# Patient Record
Sex: Male | Born: 1955 | Race: White | Hispanic: No | Smoking: Current every day smoker
Health system: Southern US, Community
[De-identification: ages and names within clinical notes are randomized; demographics above are authoritative.]

## PROBLEM LIST (undated history)

## (undated) DIAGNOSIS — F419 Anxiety disorder, unspecified: Secondary | ICD-10-CM

## (undated) DIAGNOSIS — E119 Type 2 diabetes mellitus without complications: Secondary | ICD-10-CM

## (undated) DIAGNOSIS — F32A Depression, unspecified: Secondary | ICD-10-CM

## (undated) DIAGNOSIS — F1910 Other psychoactive substance abuse, uncomplicated: Secondary | ICD-10-CM

## (undated) DIAGNOSIS — F329 Major depressive disorder, single episode, unspecified: Secondary | ICD-10-CM

## (undated) DIAGNOSIS — E785 Hyperlipidemia, unspecified: Secondary | ICD-10-CM

## (undated) DIAGNOSIS — I10 Essential (primary) hypertension: Secondary | ICD-10-CM

## (undated) DIAGNOSIS — I8390 Asymptomatic varicose veins of unspecified lower extremity: Secondary | ICD-10-CM

## (undated) DIAGNOSIS — IMO0002 Reserved for concepts with insufficient information to code with codable children: Secondary | ICD-10-CM

## (undated) DIAGNOSIS — L409 Psoriasis, unspecified: Secondary | ICD-10-CM

## (undated) DIAGNOSIS — K219 Gastro-esophageal reflux disease without esophagitis: Secondary | ICD-10-CM

## (undated) DIAGNOSIS — F1994 Other psychoactive substance use, unspecified with psychoactive substance-induced mood disorder: Secondary | ICD-10-CM

## (undated) HISTORY — PX: VEIN LIGATION AND STRIPPING: SHX2653

## (undated) HISTORY — PX: CHOLECYSTECTOMY: SHX55

## (undated) HISTORY — DX: Gastro-esophageal reflux disease without esophagitis: K21.9

## (undated) HISTORY — DX: Hyperlipidemia, unspecified: E78.5

## (undated) HISTORY — DX: Psoriasis, unspecified: L40.9

## (undated) HISTORY — PX: SINUSOTOMY: SHX291

## (undated) HISTORY — DX: Reserved for concepts with insufficient information to code with codable children: IMO0002

## (undated) HISTORY — DX: Essential (primary) hypertension: I10

---

## 2002-08-30 ENCOUNTER — Emergency Department (HOSPITAL_COMMUNITY): Admission: EM | Admit: 2002-08-30 | Discharge: 2002-08-30 | Payer: Self-pay | Admitting: Emergency Medicine

## 2002-11-01 ENCOUNTER — Inpatient Hospital Stay (HOSPITAL_COMMUNITY): Admission: EM | Admit: 2002-11-01 | Discharge: 2002-11-05 | Payer: Self-pay | Admitting: Psychiatry

## 2007-08-22 ENCOUNTER — Emergency Department (HOSPITAL_COMMUNITY): Admission: EM | Admit: 2007-08-22 | Discharge: 2007-08-22 | Payer: Self-pay | Admitting: Emergency Medicine

## 2008-07-24 ENCOUNTER — Emergency Department (HOSPITAL_COMMUNITY): Admission: EM | Admit: 2008-07-24 | Discharge: 2008-07-24 | Payer: Self-pay | Admitting: Emergency Medicine

## 2008-08-24 ENCOUNTER — Emergency Department (HOSPITAL_BASED_OUTPATIENT_CLINIC_OR_DEPARTMENT_OTHER): Admission: EM | Admit: 2008-08-24 | Discharge: 2008-08-24 | Payer: Self-pay | Admitting: Emergency Medicine

## 2008-12-15 ENCOUNTER — Emergency Department (HOSPITAL_BASED_OUTPATIENT_CLINIC_OR_DEPARTMENT_OTHER): Admission: EM | Admit: 2008-12-15 | Discharge: 2008-12-15 | Payer: Self-pay | Admitting: Emergency Medicine
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
## 2011-08-20 ENCOUNTER — Emergency Department (HOSPITAL_COMMUNITY)
Admission: EM | Admit: 2011-08-20 | Discharge: 2011-08-20 | Disposition: A | Discharge status: Home / Self Care | Payer: Self-pay

## 2011-08-20 ENCOUNTER — Encounter: Payer: Self-pay | Admitting: *Deleted
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
## 2011-08-20 NOTE — ED Notes (Signed)
Patient has lower back pain that radiates into the right hip and leg.  Patient states the pain is throbbing and he cannot sleep.  Patient has hx of herniated disk

## 2012-07-20 ENCOUNTER — Emergency Department (HOSPITAL_COMMUNITY): Payer: Medicare Other

## 2012-07-20 ENCOUNTER — Encounter (HOSPITAL_COMMUNITY): Payer: Self-pay | Admitting: Emergency Medicine

## 2012-07-20 ENCOUNTER — Emergency Department (HOSPITAL_COMMUNITY)
Admission: EM | Admit: 2012-07-20 | Discharge: 2012-07-21 | Disposition: A | Discharge status: Home / Self Care | Payer: Medicare Other | Attending: Emergency Medicine | Admitting: Emergency Medicine

## 2012-07-20 DIAGNOSIS — Y92009 Unspecified place in unspecified non-institutional (private) residence as the place of occurrence of the external cause: Secondary | ICD-10-CM | POA: Insufficient documentation

## 2012-07-20 DIAGNOSIS — S93409A Sprain of unspecified ligament of unspecified ankle, initial encounter: Secondary | ICD-10-CM | POA: Insufficient documentation

## 2012-07-20 DIAGNOSIS — F3289 Other specified depressive episodes: Secondary | ICD-10-CM | POA: Insufficient documentation

## 2012-07-20 DIAGNOSIS — F329 Major depressive disorder, single episode, unspecified: Secondary | ICD-10-CM | POA: Insufficient documentation

## 2012-07-20 DIAGNOSIS — F17200 Nicotine dependence, unspecified, uncomplicated: Secondary | ICD-10-CM | POA: Insufficient documentation

## 2012-07-20 DIAGNOSIS — F411 Generalized anxiety disorder: Secondary | ICD-10-CM | POA: Insufficient documentation

## 2012-07-20 DIAGNOSIS — R296 Repeated falls: Secondary | ICD-10-CM | POA: Insufficient documentation

## 2012-07-20 DIAGNOSIS — Z79899 Other long term (current) drug therapy: Secondary | ICD-10-CM | POA: Insufficient documentation

## 2012-07-20 DIAGNOSIS — Y93E1 Activity, personal bathing and showering: Secondary | ICD-10-CM | POA: Insufficient documentation

## 2012-07-20 HISTORY — DX: Major depressive disorder, single episode, unspecified: F32.9

## 2012-07-20 HISTORY — DX: Anxiety disorder, unspecified: F41.9

## 2012-07-20 HISTORY — DX: Depression, unspecified: F32.A

## 2012-07-20 NOTE — ED Notes (Signed)
PT. REPORTS PAIN / SWELLING AT RIGHT ANKLE SLIPPED AND FELL ON WET FLOOR 3 DAYS AGO .

## 2012-07-21 MED ORDER — HYDROCODONE-ACETAMINOPHEN 5-325 MG PO TABS: 1.0000 | ORAL_TABLET | Freq: Once | ORAL | Status: DC

## 2012-07-21 MED ORDER — HYDROCODONE-ACETAMINOPHEN 5-325 MG PO TABS
1.0000 | ORAL_TABLET | Freq: Once | ORAL | Status: AC
  Administered 2012-07-21: 1 via ORAL
  Filled 2012-07-21: qty 1

## 2012-07-21 NOTE — ED Provider Notes (Signed)
Medical screening examination/treatment/procedure(s) were performed by non-physician practitioner and as supervising physician I was immediately available for consultation/collaboration.    Jayland Null, MD 07/21/12 0608 

## 2012-07-21 NOTE — ED Provider Notes (Signed)
History     CSN: [id number]  Arrival date & time 07/20/12  2247   First MD Initiated Contact with Patient 07/21/12 0025      Chief Complaint  Patient presents with  . Ankle Pain    (Consider location/radiation/quality/duration/timing/severity/associated sxs/prior treatment) HPI Comments: Patient fell in the shower over a week ago still having R ankle pain taking OTC advil without relief has not seen his PCP  Patient is a 56 y.o. male presenting with ankle pain. The history is provided by the patient.  Ankle Pain  The incident occurred more than 1 week ago. The incident occurred at home. The injury mechanism was a fall. Pertinent negatives include no numbness.    Past Medical History  Diagnosis Date  . Anxiety   . Depression     Past Surgical History  Procedure Date  . Vein ligation and stripping   . Cholecystectomy   . Sinusotomy     No family history on file.  History  Substance Use Topics  . Smoking status: Current Every Day Smoker  . Smokeless tobacco: Not on file  . Alcohol Use: No      Review of Systems  Constitutional: Negative for fever and chills.  Musculoskeletal: Negative for joint swelling.  Skin: Positive for wound.  Neurological: Negative for weakness and numbness.    Allergies  Review of patient's allergies indicates no known allergies.  Home Medications   Current Outpatient Rx  Name Route Sig Dispense Refill  . ACETAMINOPHEN 325 MG PO TABS Oral Take 650 mg by mouth every 6 (six) hours as needed. For pain    . HUMIRA PEN [place] Subcutaneous Inject 40 mg into the skin once a week. mondays     . CLONAZEPAM 2 MG PO TABS Oral Take 2 mg by mouth 3 (three) times daily as needed. For panic attacks     . SERTRALINE HCL 100 MG PO TABS Oral Take 100 mg by mouth 2 (two) times daily.      Marland Kitchen HYDROCODONE-ACETAMINOPHEN 5-325 MG PO TABS Oral Take 1 tablet by mouth once. 30 tablet 0    BP 130/97  Pulse 105  Temp 97.8 F (36.6 C) (Oral)  Resp 14  SpO2  97%  Physical Exam  Constitutional: He appears well-developed and well-nourished.  HENT:  Head: Normocephalic.  Eyes: Pupils are equal, round, and reactive to light.  Neck: Normal range of motion.  Cardiovascular: Normal rate.   Pulmonary/Chest: Effort normal.  Musculoskeletal: Normal range of motion. He exhibits tenderness. He exhibits no edema.       Feet:  Neurological: He is alert.  Skin: Skin is warm. No erythema.    ED Course  Procedures (including critical care time)  Labs Reviewed - No data to display Dg Ankle Complete Right  07/20/2012  *RADIOLOGY REPORT*  Clinical Data: Fall with ankle pain and swelling.  RIGHT ANKLE - COMPLETE 3+ VIEW  Comparison: None.  Findings: Three views of the ankle are negative for acute fracture or dislocation.  There may be spurring along the dorsal aspect of the talus. There is a small calcaneal spur.  IMPRESSION: No acute bony abnormality.   Original Report Authenticated By: Richarda Overlie, M.D.      1. Ankle sprain       MDM  No FX seen on film will give ASO and medicate  With referral         Arman Filter, NP 07/21/12 0040

## 2012-07-21 NOTE — Progress Notes (Signed)
Orthopedic Tech Progress Note Patient Details:  Cory Foster 10-24-1955 [id number]  Ortho Devices Type of Ortho Device: ASO Ortho Device/Splint Interventions: Application   Malachi Bonds Ray 07/21/2012, 7:05 AM

## 2012-07-21 NOTE — ED Notes (Signed)
Patient reports falling in the bathroom 1 week ago on a wet floor.  Patient denies striking his head when he fell.  Patient is able to ambulate independently but reports pain with ambulation.  Full ROM noted to right foot and right lower extremity.  Pulses present in right foot and right lower extremity.

## 2013-05-23 ENCOUNTER — Emergency Department (HOSPITAL_BASED_OUTPATIENT_CLINIC_OR_DEPARTMENT_OTHER): Payer: Medicare Other

## 2013-05-23 ENCOUNTER — Encounter (HOSPITAL_BASED_OUTPATIENT_CLINIC_OR_DEPARTMENT_OTHER): Payer: Self-pay | Admitting: *Deleted

## 2013-05-23 ENCOUNTER — Emergency Department (HOSPITAL_BASED_OUTPATIENT_CLINIC_OR_DEPARTMENT_OTHER)
Admission: EM | Admit: 2013-05-23 | Discharge: 2013-05-24 | Disposition: A | Discharge status: Home / Self Care | Payer: Medicare Other | Attending: Emergency Medicine | Admitting: Emergency Medicine

## 2013-05-23 DIAGNOSIS — S39012A Strain of muscle, fascia and tendon of lower back, initial encounter: Secondary | ICD-10-CM

## 2013-05-23 DIAGNOSIS — S7001XA Contusion of right hip, initial encounter: Secondary | ICD-10-CM

## 2013-05-23 DIAGNOSIS — F411 Generalized anxiety disorder: Secondary | ICD-10-CM | POA: Insufficient documentation

## 2013-05-23 DIAGNOSIS — W010XXA Fall on same level from slipping, tripping and stumbling without subsequent striking against object, initial encounter: Secondary | ICD-10-CM | POA: Insufficient documentation

## 2013-05-23 DIAGNOSIS — S46911A Strain of unspecified muscle, fascia and tendon at shoulder and upper arm level, right arm, initial encounter: Secondary | ICD-10-CM

## 2013-05-23 DIAGNOSIS — F329 Major depressive disorder, single episode, unspecified: Secondary | ICD-10-CM | POA: Insufficient documentation

## 2013-05-23 DIAGNOSIS — Z9889 Other specified postprocedural states: Secondary | ICD-10-CM | POA: Insufficient documentation

## 2013-05-23 DIAGNOSIS — S335XXA Sprain of ligaments of lumbar spine, initial encounter: Secondary | ICD-10-CM | POA: Insufficient documentation

## 2013-05-23 DIAGNOSIS — S7000XA Contusion of unspecified hip, initial encounter: Secondary | ICD-10-CM | POA: Insufficient documentation

## 2013-05-23 DIAGNOSIS — Y9229 Other specified public building as the place of occurrence of the external cause: Secondary | ICD-10-CM | POA: Insufficient documentation

## 2013-05-23 DIAGNOSIS — Y9389 Activity, other specified: Secondary | ICD-10-CM | POA: Insufficient documentation

## 2013-05-23 DIAGNOSIS — F17200 Nicotine dependence, unspecified, uncomplicated: Secondary | ICD-10-CM | POA: Insufficient documentation

## 2013-05-23 DIAGNOSIS — W19XXXA Unspecified fall, initial encounter: Secondary | ICD-10-CM

## 2013-05-23 DIAGNOSIS — F3289 Other specified depressive episodes: Secondary | ICD-10-CM | POA: Insufficient documentation

## 2013-05-23 DIAGNOSIS — IMO0002 Reserved for concepts with insufficient information to code with codable children: Secondary | ICD-10-CM | POA: Insufficient documentation

## 2013-05-23 DIAGNOSIS — Z79899 Other long term (current) drug therapy: Secondary | ICD-10-CM | POA: Insufficient documentation

## 2013-05-23 NOTE — ED Notes (Signed)
Pt c/o fall x 4 hrs ago with right hip and back pain
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
## 2013-05-23 NOTE — ED Notes (Signed)
MD at bedside. 

## 2013-05-24 MED ORDER — OXYCODONE-ACETAMINOPHEN 5-325 MG PO TABS
2.0000 | ORAL_TABLET | Freq: Once | ORAL | Status: AC
  Administered 2013-05-24: 2 via ORAL
  Filled 2013-05-24: qty 2

## 2013-05-24 MED ORDER — OXYCODONE-ACETAMINOPHEN 5-325 MG PO TABS: 2.0000 | ORAL_TABLET | ORAL | Status: DC | PRN

## 2013-05-24 MED ORDER — OXYCODONE-ACETAMINOPHEN 5-325 MG PO TABS
ORAL_TABLET | ORAL | Status: AC
  Administered 2013-05-24: 2 via ORAL
  Filled 2013-05-24: qty 2

## 2013-06-02 ENCOUNTER — Emergency Department (HOSPITAL_COMMUNITY)
Admission: EM | Admit: 2013-06-02 | Discharge: 2013-06-02 | Disposition: A | Discharge status: Home / Self Care | Payer: Medicare Other | Attending: Emergency Medicine | Admitting: Emergency Medicine

## 2013-06-02 ENCOUNTER — Encounter (HOSPITAL_COMMUNITY): Payer: Self-pay | Admitting: Emergency Medicine

## 2013-06-02 ENCOUNTER — Emergency Department (HOSPITAL_COMMUNITY): Payer: Medicare Other

## 2013-06-02 DIAGNOSIS — F411 Generalized anxiety disorder: Secondary | ICD-10-CM | POA: Insufficient documentation

## 2013-06-02 DIAGNOSIS — S63502A Unspecified sprain of left wrist, initial encounter: Secondary | ICD-10-CM

## 2013-06-02 DIAGNOSIS — Z79899 Other long term (current) drug therapy: Secondary | ICD-10-CM | POA: Insufficient documentation

## 2013-06-02 DIAGNOSIS — Y9339 Activity, other involving climbing, rappelling and jumping off: Secondary | ICD-10-CM | POA: Insufficient documentation

## 2013-06-02 DIAGNOSIS — S63509A Unspecified sprain of unspecified wrist, initial encounter: Secondary | ICD-10-CM | POA: Insufficient documentation

## 2013-06-02 DIAGNOSIS — F329 Major depressive disorder, single episode, unspecified: Secondary | ICD-10-CM | POA: Insufficient documentation

## 2013-06-02 DIAGNOSIS — Y929 Unspecified place or not applicable: Secondary | ICD-10-CM | POA: Insufficient documentation

## 2013-06-02 DIAGNOSIS — F3289 Other specified depressive episodes: Secondary | ICD-10-CM | POA: Insufficient documentation

## 2013-06-02 DIAGNOSIS — W1809XA Striking against other object with subsequent fall, initial encounter: Secondary | ICD-10-CM | POA: Insufficient documentation

## 2013-06-02 DIAGNOSIS — F17200 Nicotine dependence, unspecified, uncomplicated: Secondary | ICD-10-CM | POA: Insufficient documentation

## 2013-06-02 DIAGNOSIS — F1010 Alcohol abuse, uncomplicated: Secondary | ICD-10-CM | POA: Insufficient documentation

## 2013-06-02 MED ORDER — OXYCODONE-ACETAMINOPHEN 5-325 MG PO TABS
1.0000 | ORAL_TABLET | Freq: Once | ORAL | Status: AC
  Administered 2013-06-02: 1 via ORAL
  Filled 2013-06-02: qty 1

## 2013-06-02 MED ORDER — OXYCODONE-ACETAMINOPHEN 5-325 MG PO TABS: 1.0000 | ORAL_TABLET | ORAL | Status: DC | PRN
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
## 2013-06-02 NOTE — ED Provider Notes (Signed)
Medical screening examination/treatment/procedure(s) were performed by non-physician practitioner and as supervising physician I was immediately available for consultation/collaboration.  Lyanne Co, MD 06/02/13 [phone]
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
## 2013-06-02 NOTE — ED Provider Notes (Signed)
CSN: [id number]     Arrival date & time 06/02/13  2016 History   First MD Initiated Contact with Patient 06/02/13 2020     Chief Complaint  Patient presents with  . Arm Injury  . Alcohol Intoxication   (Consider location/radiation/quality/duration/timing/severity/associated sxs/prior Treatment) Patient is a 57 y.o. male presenting with arm injury and intoxication. The history is provided by the patient.  Arm Injury Location:  Wrist Injury: yes   Mechanism of injury: fall   Fall:    Impact surface:  Gravel Wrist location:  L wrist Associated symptoms: no fever and no neck pain   Associated symptoms comment:  Left wrist injury after her jumped from a porch and broke his fall with outstretched left arm. He denies having hit his head. No neck, chest or abdominal pain. He has been ambulatory without lower extremity pain.  Alcohol Intoxication Pertinent negatives include no abdominal pain, chills, fever, neck pain or numbness.    Past Medical History  Diagnosis Date  . Anxiety   . Depression    Past Surgical History  Procedure Laterality Date  . Vein ligation and stripping    . Cholecystectomy    . Sinusotomy     History reviewed. No pertinent family history. History  Substance Use Topics  . Smoking status: Current Every Day Smoker -- 0.50 packs/day    Types: Cigarettes  . Smokeless tobacco: Not on file  . Alcohol Use: Yes    Review of Systems  Constitutional: Negative for fever and chills.  HENT: Negative.  Negative for neck pain.   Gastrointestinal: Negative.  Negative for abdominal pain.  Musculoskeletal:       See HPI  Skin: Negative.  Negative for wound.  Neurological: Negative.  Negative for numbness.    Allergies  Review of patient's allergies indicates no known allergies.  Home Medications   Current Outpatient Rx  Name  Route  Sig  Dispense  Refill  . acetaminophen (TYLENOL) 325 MG tablet   Oral   Take 650 mg by mouth every 6 (six) hours as needed. For  pain         . clonazePAM (KLONOPIN) 2 MG tablet   Oral   Take 2 mg by mouth 3 (three) times daily as needed. For panic attacks          . HYDROcodone-acetaminophen (NORCO/VICODIN) 5-325 MG per tablet   Oral   Take 1 tablet by mouth once.   30 tablet   0   . oxyCODONE-acetaminophen (PERCOCET) 5-325 MG per tablet   Oral   Take 2 tablets by mouth every 4 (four) hours as needed for pain.   15 tablet   0   . sertraline (ZOLOFT) 100 MG tablet   Oral   Take 100 mg by mouth 2 (two) times daily.            There were no vitals taken for this visit. Physical Exam  Constitutional: He is oriented to person, place, and time. He appears well-developed and well-nourished. No distress.  Patient is acutely intoxicated but coherent, NAD and cooperative.  HENT:  Head: Atraumatic.  Neck: Normal range of motion.  Pulmonary/Chest: Effort normal. He exhibits no tenderness.  Abdominal: There is no tenderness.  Musculoskeletal:  No lower extremity or right arm tenderness or deformity. Left wrist minimally swollen without bony deformity. Pain on rotation. 5/5 grip.   Neurological: He is alert and oriented to person, place, and time.  Cranial nerves 3-12 grossly intact. Ambulatory with steady  gait.  Skin: Skin is warm and dry.  Psychiatric: He has a normal mood and affect.    ED Course  Procedures (including critical care time) Labs Review Labs Reviewed - No data to display Imaging Review No results found. Dg Lumbar Spine Complete  05/24/2013   *RADIOLOGY REPORT*  Clinical Data: Back pain, fall.  LUMBAR SPINE - COMPLETE 4+ VIEW  Comparison: 07/24/2008  Findings: Postoperative changes of posterior fusion at L4-5. Minimal retrolisthesis of L3 on L4. L5-S1 degenerative disc disease.  Mild T11 vertebral body height loss is similar to prior. No acute fracture or dislocation. The right upper quadrant surgical clips.  Overlying soft tissues unremarkable.  IMPRESSION: No acute osseous finding of  the lumbar spine.  Postoperative changes at L4-5.  Degenerative changes are most pronounced at L5-S1.  Minimal retrolisthesis of L3 on L4.   Original Report Authenticated By: Jearld Lesch, M.D.   Dg Shoulder Right  05/24/2013   *RADIOLOGY REPORT*  Clinical Data: Right shoulder pain status post fall  RIGHT SHOULDER - 2+ VIEW  Comparison: None.  Findings: Glenohumeral and acromioclavicular joints are intact.  No displaced fracture or dislocation.  Right upper lung grossly clear.  IMPRESSION: No acute osseous finding right shoulder.  If clinical concern for a fracture persists, recommend a repeat radiograph in 5-10 days to evaluate for interval change or callus formation.   Original Report Authenticated By: Jearld Lesch, M.D.   Dg Wrist Complete Left  06/02/2013   *RADIOLOGY REPORT*  Clinical Data: Fall, posterolateral pain at carpus and distal forearm  LEFT WRIST - COMPLETE 3+ VIEW  Comparison: Wrist radiographs 07/24/2008  Findings: Osseous demineralization. Joint spaces preserved. Deformity at base of fifth metacarpal consistent with an old healed fracture. No acute fracture, dislocation, or bone destruction.  IMPRESSION: Osseous mineralization. No acute abnormalities.   Original Report Authenticated By: Ulyses Southward, M.D.   Dg Hip Complete Right  05/24/2013   *RADIOLOGY REPORT*  Clinical Data: Fall, right hip pain  RIGHT HIP - COMPLETE 2+ VIEW  Comparison: None.  Findings: Postoperative changes L4-5.  SI joints grossly intact. No displaced hip fracture. Femoral head projects over the acetabulum.  Rami intact.  IMPRESSION: No acute osseous finding right hip.  MRI has increased sensitivity if concern for a nondisplaced hip fracture persists.   Original Report Authenticated By: Jearld Lesch, M.D.   MDM  No diagnosis found. 1. Left wrist sprain  No fracture on x-ray c/w wrist sprain. Splint applied. Stable for discharge.     Arnoldo Hooker, PA-C 06/02/13 2143

## 2013-07-05 ENCOUNTER — Emergency Department (HOSPITAL_COMMUNITY): Payer: Medicare Other

## 2013-07-05 ENCOUNTER — Encounter (HOSPITAL_COMMUNITY): Payer: Self-pay | Admitting: Emergency Medicine

## 2013-07-05 ENCOUNTER — Emergency Department (HOSPITAL_COMMUNITY)
Admission: EM | Admit: 2013-07-05 | Discharge: 2013-07-06 | Disposition: A | Discharge status: Home / Self Care | Payer: Medicare Other | Attending: Emergency Medicine | Admitting: Emergency Medicine

## 2013-07-05 DIAGNOSIS — G8929 Other chronic pain: Secondary | ICD-10-CM

## 2013-07-05 DIAGNOSIS — F411 Generalized anxiety disorder: Secondary | ICD-10-CM | POA: Insufficient documentation

## 2013-07-05 DIAGNOSIS — Z79899 Other long term (current) drug therapy: Secondary | ICD-10-CM | POA: Insufficient documentation

## 2013-07-05 DIAGNOSIS — F3289 Other specified depressive episodes: Secondary | ICD-10-CM | POA: Insufficient documentation

## 2013-07-05 DIAGNOSIS — Z8679 Personal history of other diseases of the circulatory system: Secondary | ICD-10-CM | POA: Insufficient documentation

## 2013-07-05 DIAGNOSIS — F17200 Nicotine dependence, unspecified, uncomplicated: Secondary | ICD-10-CM | POA: Insufficient documentation

## 2013-07-05 DIAGNOSIS — F419 Anxiety disorder, unspecified: Secondary | ICD-10-CM

## 2013-07-05 DIAGNOSIS — R5381 Other malaise: Secondary | ICD-10-CM | POA: Insufficient documentation

## 2013-07-05 DIAGNOSIS — R112 Nausea with vomiting, unspecified: Secondary | ICD-10-CM | POA: Insufficient documentation

## 2013-07-05 DIAGNOSIS — R630 Anorexia: Secondary | ICD-10-CM | POA: Insufficient documentation

## 2013-07-05 DIAGNOSIS — R1011 Right upper quadrant pain: Secondary | ICD-10-CM | POA: Insufficient documentation

## 2013-07-05 DIAGNOSIS — F1010 Alcohol abuse, uncomplicated: Secondary | ICD-10-CM

## 2013-07-05 DIAGNOSIS — F329 Major depressive disorder, single episode, unspecified: Secondary | ICD-10-CM | POA: Insufficient documentation

## 2013-07-05 HISTORY — DX: Asymptomatic varicose veins of unspecified lower extremity: I83.90

## 2013-07-05 LAB — URINALYSIS, ROUTINE W REFLEX MICROSCOPIC
Bilirubin Urine: NEGATIVE
Glucose, UA: NEGATIVE mg/dL
Hgb urine dipstick: NEGATIVE
Ketones, ur: NEGATIVE mg/dL
Nitrite: NEGATIVE
Specific Gravity, Urine: 1.006 (ref 1.005–1.030)
Urobilinogen, UA: 1 mg/dL (ref 0.0–1.0)
pH: 7 (ref 5.0–8.0)

## 2013-07-05 LAB — CBC
Hemoglobin: 16.3 g/dL (ref 13.0–17.0)
MCV: 108.1 fL — ABNORMAL HIGH (ref 78.0–100.0)
Platelets: 213 10*3/uL (ref 150–400)
RBC: 4.21 MIL/uL — ABNORMAL LOW (ref 4.22–5.81)
RDW: 14.4 % (ref 11.5–15.5)
WBC: 11.2 10*3/uL — ABNORMAL HIGH (ref 4.0–10.5)

## 2013-07-05 LAB — LIPASE, BLOOD: Lipase: 48 U/L (ref 11–59)

## 2013-07-05 LAB — COMPREHENSIVE METABOLIC PANEL
AST: 70 U/L — ABNORMAL HIGH (ref 0–37)
Albumin: 2.8 g/dL — ABNORMAL LOW (ref 3.5–5.2)
BUN: 3 mg/dL — ABNORMAL LOW (ref 6–23)
GFR calc Af Amer: >90 mL/min (ref >90)
Total Bilirubin: 0.8 mg/dL (ref 0.3–1.2)
Total Protein: 7.2 g/dL (ref 6.0–8.3)

## 2013-07-05 MED ORDER — PROMETHAZINE HCL 25 MG PO TABS: 25.0000 mg | ORAL_TABLET | Freq: Four times a day (QID) | ORAL | Status: DC | PRN

## 2013-07-05 MED ORDER — IOHEXOL 300 MG/ML  SOLN
100.0000 mL | Freq: Once | INTRAMUSCULAR | Status: AC | PRN
  Administered 2013-07-05: 100 mL via INTRAVENOUS

## 2013-07-05 MED ORDER — SODIUM CHLORIDE 0.9 % IV BOLUS (SEPSIS)
1000.0000 mL | Freq: Once | INTRAVENOUS | Status: AC
  Administered 2013-07-05: 1000 mL via INTRAVENOUS

## 2013-07-05 MED ORDER — POTASSIUM CHLORIDE 20 MEQ/15ML (10%) PO LIQD
40.0000 meq | Freq: Once | ORAL | Status: AC
  Administered 2013-07-06: 40 meq via ORAL
  Filled 2013-07-05: qty 30

## 2013-07-05 MED ORDER — IOHEXOL 300 MG/ML  SOLN
50.0000 mL | Freq: Once | INTRAMUSCULAR | Status: AC | PRN
  Administered 2013-07-05: 50 mL via ORAL

## 2013-07-05 NOTE — ED Notes (Signed)
Bed: WA08 Expected date:  Expected time:  Means of arrival:  Comments: EMS/57 yo male with right abdominal pain

## 2013-07-05 NOTE — ED Notes (Signed)
Pt states the past week he's had R flank pain, denies burning w/ urination, denies n/v/d, states the past 4 days he's had difficulty eating, states he eats then doesn't finish it d/t feeling full and discomfort, states his doctor wants him to be checked for pancreatitis and states his iron level has been too high and his doctor wants that checked also, pt states also he's been stressed out lately, takes klonopin for his anxiety and states at times he has SI/HI thoughts.

## 2013-07-05 NOTE — ED Notes (Signed)
Per EMS pt has chronic abdominal pain x 30 years, but for past week has had R sided Flank pain that has increased, pt is stressed out and depressed, does have hx of depression and anxiety, pt has had nausea, no vomiting. BP 130/90, HR 84, CBG 161, RR 18.

## 2013-07-05 NOTE — ED Notes (Signed)
Patient transported to CT 

## 2013-07-05 NOTE — ED Provider Notes (Signed)
CSN: [id number]     Arrival date & time 07/05/13  1947 History   First MD Initiated Contact with Patient 07/05/13 1948     Chief Complaint  Patient presents with  . Abdominal Pain   (Consider location/radiation/quality/duration/timing/severity/associated sxs/prior Treatment) The history is provided by the patient and medical records. No language interpreter was used.    Cory Foster is a 57 y.o. male  with a hx of anxiety, depression, varicose vein, EtOH abuse presents to the Emergency Department complaining of gradual, persistent, progressively worsening right flank pain onset 4-5 days ago. Patient reports this pain is the same pain that he's had for 30 years only slightly worse.  Pt reports he has vomited x1 in the AM x2 days in the last week.  Emesis is NBNB.  Associated symptoms include increased anxiety, decreased appetite, fatigue.   Nothing makes it better and nothing makes it worse.  Pt denies fever, chills, headache, neck pain, chest pain, SOB, diarrhea, weakness, dizziness, syncope, dysuria, hematuria.  On further questioning patient does admit that his pain gets worse after he drinks alcohol. He reports heavy drinking in the last week.  PCP: [facility]  Past Medical History  Diagnosis Date  . Anxiety   . Depression   . Varicose vein    Past Surgical History  Procedure Laterality Date  . Vein ligation and stripping    . Cholecystectomy    . Sinusotomy     No family history on file. History  Substance Use Topics  . Smoking status: Current Every Day Smoker -- 0.50 packs/day    Types: Cigarettes  . Smokeless tobacco: Not on file  . Alcohol Use: Yes    Review of Systems  Constitutional: Negative for fever, diaphoresis, appetite change, fatigue and unexpected weight change.  HENT: Negative for mouth sores and trouble swallowing.   Respiratory: Negative for cough, chest tightness, shortness of breath, wheezing and stridor.   Cardiovascular: Negative for chest pain and  palpitations.  Gastrointestinal: Positive for nausea and abdominal pain. Negative for vomiting, diarrhea, constipation, blood in stool, abdominal distention and rectal pain.  Genitourinary: Negative for dysuria, urgency, frequency, hematuria, flank pain and difficulty urinating.  Musculoskeletal: Negative for back pain, neck pain and neck stiffness.  Skin: Negative for rash.  Neurological: Negative for weakness.  Hematological: Negative for adenopathy.  Psychiatric/Behavioral: Negative for confusion.  All other systems reviewed and are negative.    Allergies  Review of patient's allergies indicates no known allergies.  Home Medications   Current Outpatient Rx  Name  Route  Sig  Dispense  Refill  . clonazePAM (KLONOPIN) 2 MG tablet   Oral   Take 2 mg by mouth 3 (three) times daily as needed. For panic attacks          . sertraline (ZOLOFT) 100 MG tablet   Oral   Take 100 mg by mouth 2 (two) times daily.           . promethazine (PHENERGAN) 25 MG tablet   Oral   Take 1 tablet (25 mg total) by mouth every 6 (six) hours as needed for nausea.   12 tablet   0    BP 125/83  Pulse 92  Temp(Src) 98.1 F (36.7 C) (Oral)  Resp 18  Ht 6\' 2"  (1.88 m)  Wt 235 lb (106.595 kg)  BMI 30.16 kg/m2  SpO2 96% Physical Exam  Nursing note and vitals reviewed. Constitutional: He is oriented to person, place, and time. He appears well-developed and  well-nourished. No distress.  Awake, alert, nontoxic appearance, unkempt appearance  HENT:  Head: Normocephalic and atraumatic.  Mouth/Throat: Oropharynx is clear and moist. No oropharyngeal exudate.  Eyes: Conjunctivae are normal. Pupils are equal, round, and reactive to light. No scleral icterus.  Neck: Normal range of motion. Neck supple.  Cardiovascular: Normal rate, regular rhythm, normal heart sounds and intact distal pulses.   No murmur heard. Pulmonary/Chest: Effort normal and breath sounds normal. No respiratory distress. He has no  wheezes.  Abdominal: Soft. Bowel sounds are normal. He exhibits no distension, no ascites and no mass. There is tenderness in the right upper quadrant. There is guarding. There is no rebound. Hernia confirmed negative in the right inguinal area and confirmed negative in the left inguinal area.  Genitourinary: Testes normal. Right testis shows no mass, no swelling and no tenderness. Right testis is descended. Cremasteric reflex is not absent on the right side. Left testis shows no mass, no swelling and no tenderness. Left testis is descended. Cremasteric reflex is not absent on the left side. No phimosis, paraphimosis, hypospadias, penile erythema or penile tenderness. No discharge found.  No visible or palpable hernia  Musculoskeletal: Normal range of motion. He exhibits no edema and no tenderness.  Lymphadenopathy:    He has no cervical adenopathy.       Right: No inguinal adenopathy present.       Left: No inguinal adenopathy present.  Neurological: He is alert and oriented to person, place, and time. Coordination normal.  Speech is clear and goal oriented Moves extremities without ataxia  Skin: Skin is warm and dry. He is not diaphoretic. No erythema.  Psychiatric: He has a normal mood and affect. His behavior is normal.    ED Course  Procedures (including critical care time) Labs Review Labs Reviewed  CBC - Abnormal; Notable for the following:    WBC 11.2 (*)    RBC 4.21 (*)    MCV 108.1 (*)    MCH 38.7 (*)    All other components within normal limits  COMPREHENSIVE METABOLIC PANEL - Abnormal; Notable for the following:    Sodium 134 (*)    Potassium 3.0 (*)    Chloride 91 (*)    Glucose, Bld 165 (*)    BUN 3 (*)    Albumin 2.8 (*)    AST 70 (*)    All other components within normal limits  LIPASE, BLOOD  URINALYSIS, ROUTINE W REFLEX MICROSCOPIC   Imaging Review Ct Abdomen Pelvis W Contrast  07/05/2013   *RADIOLOGY REPORT*  Clinical Data: Abdominal pain for 30 years.  CT  ABDOMEN AND PELVIS WITH CONTRAST  Technique:  Multidetector CT imaging of the abdomen and pelvis was performed following the standard protocol during bolus administration of intravenous contrast.  Contrast: OMNIPAQUE IOHEXOL 300 MG/ML  SOLN  Comparison: Lumbar spine radiographs May 23, 2013.  Findings: Limited view of the lung bases are clear.  The included heart and pericardium are unremarkable.  The liver is diffusely mildly hypodense, most consistent with fatty infiltration with slightly nodular contour, and enlarged at to 27 cm in cranial caudad dimension.  The, spleen, pancreas, and adrenal glands are normal in size, morphology and enhancement characteristics. Status post cholecystectomy.  The stomach, small and large bowel are normal in course and caliber without definite wall thickening or inflammatory changes though contrast has yet to reach the distal bowel.  Normal appendix.  No free fluid nor free air.  The kidneys are well-located, demonstrating  normal morphology, size and enhancement without suspicious renal masses, nephrolithiasis or hydronephrosis.  Ureters are unremarkable. 19 mm left upper pole renal cyst.  Urinary bladder is partially distended and appears normal.  Great vessels are normal in course and caliber with minimal calcific atherosclerosis.  No lymphadenopathy by CT size criteria. Tiny peri pancreatic lymph nodes.  Internal reproductive organs appear normal.  Status post L4-5 laminectomy, pedicle screw placement with artificial disc, which which does not appear incorporated.  Severe L5 S1 degenerative disc disease with severe right neural foraminal narrowing.  IMPRESSION: No acute intra-abdominal nor pelvic process.  Hepatomegaly and steatosis, with nodular contour consist with cirrhosis, no ascites.  Status post cholecystectomy.   Original Report Authenticated By: Awilda Metro    EKG Interpretation   None       MDM   1. Chronic abdominal pain   2. ETOH abuse   3.  Anxiety      Cory Foster presents with chronic abdominal pain for 30 years.  He reports that in the last week and he increased his drinking his right-sided flank pain increased.  Denies history of kidney stones.  Patient tender in the right upper corner on physical exam. History of cholecystectomy.  10:00pm Patient with monocytosis 11.2, mild hypokalemia at 3.0 repleted here in the department.  We'll plan for DC home with potassium prescription.  Patient glucose 165, anion gap 15 prior to fluid resuscitation; likely 2/2 to his large chronic EtOH intake.  Lipase WNL.  Will CT.  11:38 PM CT abd without acute abnormality but hepatomegaly and steatosis consistent with cirrhosis.  I personally reviewed the imaging tests through PACS system.  I reviewed available ER/hospitalization records through the EMR.    Patient is nontoxic, nonseptic appearing, in no apparent distress.  Patient's pain and other symptoms adequately managed in emergency department.  Fluid bolus given.  Labs, imaging and vitals reviewed.  Patient does not meet the SIRS or Sepsis criteria.  On repeat exam patient does not have a surgical abdomin and there are no peritoneal signs.  No indication of appendicitis, bowel obstruction, bowel perforation, cholecystitis, diverticulitis.  Patient discharged home with symptomatic treatment and given strict instructions for follow-up with their primary care physician.  I have also discussed reasons to return immediately to the ER.  Patient expresses understanding and agrees with plan.   It has been determined that no acute conditions requiring further emergency intervention are present at this time. The patient/guardian have been advised of the diagnosis and plan. We have discussed signs and symptoms that warrant return to the ED, such as changes or worsening in symptoms.   Vital signs are stable at discharge.   BP 125/83  Pulse 92  Temp(Src) 98.1 F (36.7 C) (Oral)  Resp 18  Ht 6\' 2"   (1.88 m)  Wt 235 lb (106.595 kg)  BMI 30.16 kg/m2  SpO2 96%  Patient/guardian has voiced understanding and agreed to follow-up with the PCP or specialist.           Dierdre Forth, PA-C 07/06/13 [phone]

## 2013-07-07 NOTE — ED Provider Notes (Signed)
Medical screening examination/treatment/procedure(s) were performed by non-physician practitioner and as supervising physician I was immediately available for consultation/collaboration.   Gwyneth Sprout, MD 07/07/13 [phone]

## 2013-07-30 ENCOUNTER — Encounter (HOSPITAL_COMMUNITY): Payer: Self-pay | Admitting: Emergency Medicine

## 2013-07-30 ENCOUNTER — Emergency Department (HOSPITAL_COMMUNITY)
Admission: EM | Admit: 2013-07-30 | Discharge: 2013-07-31 | Disposition: A | Discharge status: Home / Self Care | Payer: Medicare Other | Attending: Emergency Medicine | Admitting: Emergency Medicine

## 2013-07-30 DIAGNOSIS — H00013 Hordeolum externum right eye, unspecified eyelid: Secondary | ICD-10-CM

## 2013-07-30 DIAGNOSIS — H269 Unspecified cataract: Secondary | ICD-10-CM | POA: Insufficient documentation

## 2013-07-30 DIAGNOSIS — F411 Generalized anxiety disorder: Secondary | ICD-10-CM | POA: Insufficient documentation

## 2013-07-30 DIAGNOSIS — H00019 Hordeolum externum unspecified eye, unspecified eyelid: Secondary | ICD-10-CM | POA: Insufficient documentation

## 2013-07-30 DIAGNOSIS — Z79899 Other long term (current) drug therapy: Secondary | ICD-10-CM | POA: Insufficient documentation

## 2013-07-30 DIAGNOSIS — H109 Unspecified conjunctivitis: Secondary | ICD-10-CM

## 2013-07-30 DIAGNOSIS — Z8679 Personal history of other diseases of the circulatory system: Secondary | ICD-10-CM | POA: Insufficient documentation

## 2013-07-30 DIAGNOSIS — F17200 Nicotine dependence, unspecified, uncomplicated: Secondary | ICD-10-CM | POA: Insufficient documentation

## 2013-07-30 DIAGNOSIS — F329 Major depressive disorder, single episode, unspecified: Secondary | ICD-10-CM | POA: Insufficient documentation

## 2013-07-30 DIAGNOSIS — F3289 Other specified depressive episodes: Secondary | ICD-10-CM | POA: Insufficient documentation

## 2013-07-30 DIAGNOSIS — Z9889 Other specified postprocedural states: Secondary | ICD-10-CM | POA: Insufficient documentation

## 2013-07-30 MED ORDER — FLUORESCEIN SODIUM 1 MG OP STRP
1.0000 | ORAL_STRIP | Freq: Once | OPHTHALMIC | Status: AC
  Administered 2013-07-31: 1 via OPHTHALMIC
  Filled 2013-07-30: qty 1

## 2013-07-30 MED ORDER — TETRACAINE HCL 0.5 % OP SOLN
1.0000 [drp] | Freq: Once | OPHTHALMIC | Status: AC
  Administered 2013-07-31: 1 [drp] via OPHTHALMIC
  Filled 2013-07-30: qty 2

## 2013-07-30 NOTE — ED Provider Notes (Signed)
CSN: [id number]     Arrival date & time 07/30/13  2222 History  This chart was scribed for non-physician practitioner Antony Madura, PA-C, working with Loren Racer, MD by Dorothey Baseman, ED Scribe. This patient was seen in room WA03/WA03 and the patient's care was started at 11:02 PM.    Chief Complaint  Patient presents with  . Eye Problem   Patient is a 57 y.o. male presenting with eye problem. The history is provided by the patient. No language interpreter was used.  Eye Problem Location:  Both Quality:  Burning and sharp Severity:  Moderate Timing:  Constant Chronicity:  New Relieved by:  None tried Worsened by:  Eye movement Ineffective treatments:  None tried Associated symptoms: crusting, decreased vision, foreign body sensation and redness    HPI Comments: Cory Foster is a 57 y.o. male brought in by ambulance, who presents to the Emergency Department complaining of a constant, sharp, burning bilateral eye pain with associated redness onset 3 weeks ago. He reports associated crusting and matting to the bilateral eyes upon waking. Patient reports a history of 3 nasal reconstructive surgeries, last one was about 30 years ago and states that it feels like "something like the pins and needles broke loose" and he feels like "they are in his throat and eyes." He states that the pain is exacerbated with eye movements. He reports associated decreased vision, even with the use of his glasses, that has been gradually worsening. He reports taking Sudafed at home, but denies taking any other medications or trying any other treatments at home. He denies fever. He reports cataracts in the both eyes. Patient reports a history of bilateral retinal detachment that was surgically repaired.    Past Medical History  Diagnosis Date  . Anxiety   . Depression   . Varicose vein    Past Surgical History  Procedure Laterality Date  . Vein ligation and stripping    . Cholecystectomy    . Sinusotomy      No family history on file. History  Substance Use Topics  . Smoking status: Current Every Day Smoker -- 0.50 packs/day    Types: Cigarettes  . Smokeless tobacco: Not on file  . Alcohol Use: Yes    Review of Systems  Constitutional: Negative for fever.  Eyes: Positive for pain, redness and visual disturbance.  All other systems reviewed and are negative.    Allergies  Review of patient's allergies indicates no known allergies.  Home Medications   Current Outpatient Rx  Name  Route  Sig  Dispense  Refill  . adalimumab (HUMIRA) 40 MG/0.8ML injection   Subcutaneous   Inject 40 mg into the skin every 14 (fourteen) days.         . clonazePAM (KLONOPIN) 2 MG tablet   Oral   Take 2 mg by mouth 3 (three) times daily as needed. For panic attacks          . sertraline (ZOLOFT) 100 MG tablet   Oral   Take 100 mg by mouth 2 (two) times daily.           Marland Kitchen tobramycin (TOBREX) 0.3 % ophthalmic solution   Ophthalmic   Apply 1 drop to eye every 4 (four) hours. Place one drop in the affected eye every 4 hours for 7 days   5 mL   0    Triage Vitals: BP 127/81  Pulse 102  Temp(Src) 98.5 F (36.9 C) (Oral)  Resp 20  SpO2 97%  Physical Exam  Nursing note and vitals reviewed. Constitutional: He is oriented to person, place, and time. He appears well-developed and well-nourished. No distress.  HENT:  Head: Normocephalic and atraumatic.  Mouth/Throat: Oropharynx is clear and moist. No oropharyngeal exudate.  Eyes: EOM are normal. Pupils are equal, round, and reactive to light. Lids are everted and swept, no foreign bodies found. Right eye exhibits hordeolum. No foreign body present in the right eye. Left eye exhibits no hordeolum. No foreign body present in the left eye. Right conjunctiva is injected. Right conjunctiva has no hemorrhage. Left conjunctiva is not injected. Left conjunctiva has no hemorrhage. No scleral icterus.  Slit lamp exam:      The right eye shows no  corneal abrasion, no corneal ulcer and no fluorescein uptake.       The left eye shows no corneal abrasion, no corneal ulcer and no fluorescein uptake.  No uptake on fluorescein staining b/l; no evidence of corneal abrasion or ulcer. IOP 12 OS and 11 OD. Visual acuity without corrective lenses: 20/100 OS, OD, OU.  Neck: Normal range of motion. Neck supple.  Pulmonary/Chest: Effort normal. No respiratory distress.  Musculoskeletal: Normal range of motion.  Neurological: He is alert and oriented to person, place, and time.  Skin: Skin is warm and dry. No rash noted. He is not diaphoretic. No erythema. No pallor.  Psychiatric: He has a normal mood and affect. His behavior is normal.    ED Course  Procedures (including critical care time)  DIAGNOSTIC STUDIES: Oxygen Saturation is 97% on room air, normal by my interpretation.    COORDINATION OF CARE: 11:09 PM- Will consult with Dr. Ranae Palms. Will measure intraocular pressures. Discussed treatment plan with patient at bedside and patient verbalized agreement.   Labs Review Labs Reviewed - No data to display Imaging Review No results found.  EKG Interpretation   None       MDM   1. Conjunctivitis   2. Hordeolum, right    57 year old male with conjunctivitis of his right eye as well as hordeolum of right upper lid. Patient well and nontoxic appearing, hemodynamically stable, and afebrile. Visual acuity 20/100 OS, OD, and OU. PERRL and no pain with EOMs. R eye injected without hemorrhage. No uptake on fluorescein staining. Normal IOP b/l. Patient appropriate and stable for d/c with referral to ophthalmology for further evaluation of symptoms as patient insistent that symptoms secondary to "dislodged metal in his face" from prior sinus surgery. Will prescribe Tobramycin for conjunctivitis and have recommended warm compresses for hordeolum. Return precautions discussed and patient agreeable to plan with no unaddressed concerns.  I  personally performed the services described in this documentation, which was scribed in my presence. The recorded information has been reviewed and is accurate.      Antony Madura, PA-C 08/01/13 2044

## 2013-07-30 NOTE — ED Notes (Signed)
Pt presents with c/o eye pain. Per EMS, pt had surgery on sinuses 30 years ago and now he feels like the metal from the surgery has dislodged and he can feel it in his throat and in his eye for three weeks.

## 2013-07-30 NOTE — ED Notes (Signed)
Bed: WTR9 Expected date:  Expected time:  Means of arrival:  Comments: EMS/eye pain

## 2013-07-31 MED ORDER — TOBRAMYCIN 0.3 % OP SOLN: 1.0000 [drp] | OPHTHALMIC | Status: DC

## 2013-08-02 NOTE — ED Provider Notes (Signed)
Medical screening examination/treatment/procedure(s) were performed by non-physician practitioner and as supervising physician I was immediately available for consultation/collaboration.  EKG Interpretation   None        Isis Costanza T Shelisha Gautier, MD 08/02/13 [id number] 

## 2013-08-06 ENCOUNTER — Other Ambulatory Visit: Payer: Self-pay | Admitting: Internal Medicine

## 2013-08-06 ENCOUNTER — Telehealth: Payer: Self-pay | Admitting: *Deleted

## 2013-08-06 DIAGNOSIS — F410 Panic disorder [episodic paroxysmal anxiety] without agoraphobia: Secondary | ICD-10-CM

## 2013-08-06 MED ORDER — CLONAZEPAM 2 MG PO TABS: ORAL_TABLET | ORAL | Status: DC

## 2013-08-07 ENCOUNTER — Emergency Department (HOSPITAL_COMMUNITY)
Admission: EM | Admit: 2013-08-07 | Discharge: 2013-08-08 | Disposition: A | Discharge status: Other Acute Inpatient Hospital | Payer: Medicare Other | Source: Home / Self Care | Attending: Emergency Medicine | Admitting: Emergency Medicine

## 2013-08-07 ENCOUNTER — Encounter (HOSPITAL_COMMUNITY): Payer: Self-pay | Admitting: Emergency Medicine

## 2013-08-07 DIAGNOSIS — F3289 Other specified depressive episodes: Secondary | ICD-10-CM | POA: Insufficient documentation

## 2013-08-07 DIAGNOSIS — F17200 Nicotine dependence, unspecified, uncomplicated: Secondary | ICD-10-CM | POA: Insufficient documentation

## 2013-08-07 DIAGNOSIS — IMO0002 Reserved for concepts with insufficient information to code with codable children: Secondary | ICD-10-CM | POA: Insufficient documentation

## 2013-08-07 DIAGNOSIS — Z79899 Other long term (current) drug therapy: Secondary | ICD-10-CM | POA: Insufficient documentation

## 2013-08-07 DIAGNOSIS — F329 Major depressive disorder, single episode, unspecified: Secondary | ICD-10-CM | POA: Insufficient documentation

## 2013-08-07 DIAGNOSIS — S51809A Unspecified open wound of unspecified forearm, initial encounter: Secondary | ICD-10-CM | POA: Insufficient documentation

## 2013-08-07 DIAGNOSIS — H5789 Other specified disorders of eye and adnexa: Secondary | ICD-10-CM | POA: Insufficient documentation

## 2013-08-07 DIAGNOSIS — Z23 Encounter for immunization: Secondary | ICD-10-CM | POA: Insufficient documentation

## 2013-08-07 DIAGNOSIS — Z8679 Personal history of other diseases of the circulatory system: Secondary | ICD-10-CM | POA: Insufficient documentation

## 2013-08-07 DIAGNOSIS — Y9389 Activity, other specified: Secondary | ICD-10-CM | POA: Insufficient documentation

## 2013-08-07 DIAGNOSIS — F1020 Alcohol dependence, uncomplicated: Secondary | ICD-10-CM

## 2013-08-07 DIAGNOSIS — F411 Generalized anxiety disorder: Secondary | ICD-10-CM | POA: Insufficient documentation

## 2013-08-07 DIAGNOSIS — Y9289 Other specified places as the place of occurrence of the external cause: Secondary | ICD-10-CM | POA: Insufficient documentation

## 2013-08-07 DIAGNOSIS — R45850 Homicidal ideations: Secondary | ICD-10-CM | POA: Insufficient documentation

## 2013-08-07 LAB — CBC WITH DIFFERENTIAL/PLATELET
Basophils Relative: 1 % (ref 0–1)
Hemoglobin: 14.9 g/dL (ref 13.0–17.0)
Lymphocytes Relative: 35 % (ref 12–46)
Lymphs Abs: 3.9 10*3/uL (ref 0.7–4.0)
Monocytes Relative: 10 % (ref 3–12)
Neutro Abs: 5.7 10*3/uL (ref 1.7–7.7)
Neutrophils Relative %: 51 % (ref 43–77)
Platelets: 220 10*3/uL (ref 150–400)
RBC: 3.94 MIL/uL — ABNORMAL LOW (ref 4.22–5.81)
WBC: 11.2 10*3/uL — ABNORMAL HIGH (ref 4.0–10.5)

## 2013-08-07 LAB — RAPID URINE DRUG SCREEN, HOSP PERFORMED
Amphetamines: NOT DETECTED
Barbiturates: NOT DETECTED
Opiates: NOT DETECTED
Tetrahydrocannabinol: NOT DETECTED

## 2013-08-07 LAB — COMPREHENSIVE METABOLIC PANEL
ALT: 44 U/L (ref 0–53)
Albumin: 3.2 g/dL — ABNORMAL LOW (ref 3.5–5.2)
Alkaline Phosphatase: 109 U/L (ref 39–117)
BUN: 5 mg/dL — ABNORMAL LOW (ref 6–23)
Chloride: 90 mEq/L — ABNORMAL LOW (ref 96–112)
GFR calc Af Amer: >90 mL/min (ref >90)
Glucose, Bld: 130 mg/dL — ABNORMAL HIGH (ref 70–99)
Potassium: 3.2 mEq/L — ABNORMAL LOW (ref 3.5–5.1)
Sodium: 131 mEq/L — ABNORMAL LOW (ref 135–145)
Total Bilirubin: 1.1 mg/dL (ref 0.3–1.2)
Total Protein: 7.8 g/dL (ref 6.0–8.3)

## 2013-08-07 LAB — ETHANOL: Alcohol, Ethyl (B): 75 mg/dL — ABNORMAL HIGH (ref 0–11)

## 2013-08-07 MED ORDER — TETANUS-DIPHTH-ACELL PERTUSSIS 5-2.5-18.5 LF-MCG/0.5 IM SUSP
0.5000 mL | Freq: Once | INTRAMUSCULAR | Status: AC
  Administered 2013-08-07: 0.5 mL via INTRAMUSCULAR
  Filled 2013-08-07: qty 0.5

## 2013-08-07 MED ORDER — POTASSIUM CHLORIDE CRYS ER 20 MEQ PO TBCR
40.0000 meq | EXTENDED_RELEASE_TABLET | Freq: Once | ORAL | Status: AC
  Administered 2013-08-07: 40 meq via ORAL
  Filled 2013-08-07: qty 2

## 2013-08-07 MED ORDER — IBUPROFEN 200 MG PO TABS: 600.0000 mg | ORAL_TABLET | Freq: Three times a day (TID) | ORAL | Status: DC | PRN

## 2013-08-07 MED ORDER — NICOTINE 21 MG/24HR TD PT24: 21.0000 mg | MEDICATED_PATCH | Freq: Every day | TRANSDERMAL | Status: DC

## 2013-08-07 MED ORDER — TOBRAMYCIN 0.3 % OP SOLN
2.0000 [drp] | OPHTHALMIC | Status: DC
  Administered 2013-08-07: 2 [drp] via OPHTHALMIC
  Filled 2013-08-07: qty 5

## 2013-08-07 MED ORDER — THIAMINE HCL 100 MG/ML IJ SOLN: 100.0000 mg | Freq: Every day | INTRAMUSCULAR | Status: DC

## 2013-08-07 MED ORDER — LORAZEPAM 1 MG PO TABS: 1.0000 mg | ORAL_TABLET | Freq: Three times a day (TID) | ORAL | Status: DC | PRN

## 2013-08-07 MED ORDER — VITAMIN B-1 100 MG PO TABS
100.0000 mg | ORAL_TABLET | Freq: Every day | ORAL | Status: DC
  Administered 2013-08-07: 100 mg via ORAL
  Filled 2013-08-07: qty 1

## 2013-08-07 MED ORDER — ALUM & MAG HYDROXIDE-SIMETH 200-200-20 MG/5ML PO SUSP: 30.0000 mL | ORAL | Status: DC | PRN

## 2013-08-07 MED ORDER — LORAZEPAM 1 MG PO TABS
0.0000 mg | ORAL_TABLET | Freq: Four times a day (QID) | ORAL | Status: DC
  Administered 2013-08-07: 1 mg via ORAL
  Filled 2013-08-07: qty 1

## 2013-08-07 MED ORDER — ONDANSETRON HCL 4 MG PO TABS: 4.0000 mg | ORAL_TABLET | Freq: Three times a day (TID) | ORAL | Status: DC | PRN

## 2013-08-07 MED ORDER — LORAZEPAM 1 MG PO TABS: 0.0000 mg | ORAL_TABLET | Freq: Two times a day (BID) | ORAL | Status: DC

## 2013-08-07 NOTE — ED Provider Notes (Signed)
CSN: [id number]     Arrival date & time 08/07/13  1704 History  This chart was scribed for non-physician practitioner Santiago Glad, PA-C,  working with Nelia Shi, MD by Dorothey Baseman, ED Scribe. This patient was seen in room WTR4/WLPT4 and the patient's care was started at 6:51 PM.    Chief Complaint  Patient presents with  . Medical Clearance  . Laceration  . eye redness    The history is provided by the patient. No language interpreter was used.   HPI Comments: Cory Foster is a 57 y.o. male with a history of depression and anxiety who presents to the Emergency Department requesting medical clearance for alcohol detoxification. Patient reports that he has been drinking today, approximately 8 beers, last drink was around 4 hours ago, but that he usually has 8-12 beers a day. He denies any current withdrawal symptoms. He reports a history of hospitalization for alcohol detoxification, but denies history of DTs associated with withdrawal. Patient reports that he takes Klonopin and Zoloft daily, but he recently ran out of his Klonopin, which often causes him to drink more. Patient reports associated homicidal ideation and that he "wants to kill people that owe me a lot of money," but expressed that he will not carry it out because he "does not want to leave his son behind."  When asked if he has a homicidal plan he states, "I don't want to share it."  Patient denies any drug use and suicidal ideation. He reports an associated laceration to the left forearm that occurred earlier tonight when he reports that he grabbed a telephone pole. He reports that he does not remember when his last tetanus vaccination was.  Past Medical History  Diagnosis Date  . Anxiety   . Depression   . Varicose vein    Past Surgical History  Procedure Laterality Date  . Vein ligation and stripping    . Cholecystectomy    . Sinusotomy     Family History  Problem Relation Age of Onset  . Depression Mother    . Anxiety disorder Mother   . Cancer Father    History  Substance Use Topics  . Smoking status: Current Every Day Smoker -- 0.50 packs/day    Types: Cigarettes  . Smokeless tobacco: Never Used  . Alcohol Use: Yes     Comment: daily when out of medicine    Review of Systems  A complete 10 system review of systems was obtained and all systems are negative except as noted in the HPI and PMH.   Allergies  Review of patient's allergies indicates no known allergies.  Home Medications   Current Outpatient Rx  Name  Route  Sig  Dispense  Refill  . clonazePAM (KLONOPIN) 2 MG tablet   Oral   Take 2 mg by mouth 3 (three) times daily as needed for anxiety.         . sertraline (ZOLOFT) 100 MG tablet   Oral   Take 100 mg by mouth 2 (two) times daily.           Marland Kitchen adalimumab (HUMIRA) 40 MG/0.8ML injection   Subcutaneous   Inject 40 mg into the skin every 14 (fourteen) days.          Triage Vitals: BP 131/101  Pulse 94  Temp(Src) 98.1 F (36.7 C) (Oral)  Resp 20  Ht 6\' 2"  (1.88 m)  Wt 235 lb (106.595 kg)  BMI 30.16 kg/m2  SpO2 96%  Physical  Exam  Nursing note and vitals reviewed. Constitutional: He is oriented to person, place, and time. He appears well-developed and well-nourished. No distress.  HENT:  Head: Normocephalic and atraumatic.  Eyes: Conjunctivae are normal. Left eye exhibits discharge.  Left sclera is diffusely injected with thick, yellow discharge.   Neck: Normal range of motion. Neck supple.  Cardiovascular: Normal rate, regular rhythm and normal heart sounds.  Exam reveals no gallop and no friction rub.   No murmur heard. Pulmonary/Chest: Effort normal and breath sounds normal. No respiratory distress. He has no wheezes. He has no rales.  Abdominal: He exhibits no distension.  Musculoskeletal: Normal range of motion.  Neurological: He is alert and oriented to person, place, and time.  Skin: Skin is warm and dry.  Abrasion of the left forearm.     Psychiatric: He has a normal mood and affect. His behavior is normal.    ED Course  Procedures (including critical care time)  DIAGNOSTIC STUDIES: Oxygen Saturation is 96% on room air, normal by my interpretation.    COORDINATION OF CARE: 6:56 PM- Will order a tetanus vaccination. Patient will consult with Telepsych so that he can be transferred and treated for alcohol detoxification. Discussed treatment plan with patient at bedside and patient verbalized agreement.     Labs Review Labs Reviewed  CBC WITH DIFFERENTIAL  COMPREHENSIVE METABOLIC PANEL  URINE RAPID DRUG SCREEN (HOSP PERFORMED)  ETHANOL   Imaging Review No results found.  EKG Interpretation   None       MDM  No diagnosis found. Patient presenting today requesting alcohol detox.  Patient also expressing HI.  He reports that he does have a plan, but does not want to share it.  TTS consulted.  Psych holding orders placed.  CIWA orders also placed.  I personally performed the services described in this documentation, which was scribed in my presence. The recorded information has been reviewed and is accurate.     Santiago Glad, PA-C 08/07/13 2047
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
## 2013-08-07 NOTE — ED Notes (Signed)
Called Psych ED, they will call back once looking up pt information

## 2013-08-07 NOTE — ED Provider Notes (Signed)
10:44 PM Pt has been accepting to [facility] for ETOH detox.  I have not been directly involved in pt disposition or MSE.   1. EtOH dependence      Shanna Cisco, MD 08/07/13 2245
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
## 2013-08-07 NOTE — ED Provider Notes (Signed)
Medical screening examination/treatment/procedure(s) were performed by non-physician practitioner and as supervising physician I was immediately available for consultation/collaboration.    Ramir Malerba L Helem Reesor, MD 08/07/13 2056 

## 2013-08-07 NOTE — ED Notes (Signed)
Tobramycin Ophthalmic drops sent over to [facility] with patient belongings.

## 2013-08-08 ENCOUNTER — Inpatient Hospital Stay (HOSPITAL_COMMUNITY)
Admission: AD | Admit: 2013-08-08 | Discharge: 2013-08-15 | DRG: 897 | Disposition: A | Discharge status: Home / Self Care | Payer: Medicare Other | Source: Intra-hospital | Attending: Emergency Medicine | Admitting: Emergency Medicine

## 2013-08-08 ENCOUNTER — Encounter (HOSPITAL_COMMUNITY): Payer: Self-pay

## 2013-08-08 DIAGNOSIS — F321 Major depressive disorder, single episode, moderate: Secondary | ICD-10-CM

## 2013-08-08 DIAGNOSIS — H00013 Hordeolum externum right eye, unspecified eyelid: Secondary | ICD-10-CM

## 2013-08-08 DIAGNOSIS — F1320 Sedative, hypnotic or anxiolytic dependence, uncomplicated: Secondary | ICD-10-CM

## 2013-08-08 DIAGNOSIS — F1020 Alcohol dependence, uncomplicated: Principal | ICD-10-CM | POA: Diagnosis present

## 2013-08-08 DIAGNOSIS — F411 Generalized anxiety disorder: Secondary | ICD-10-CM

## 2013-08-08 DIAGNOSIS — H109 Unspecified conjunctivitis: Secondary | ICD-10-CM

## 2013-08-08 DIAGNOSIS — F329 Major depressive disorder, single episode, unspecified: Secondary | ICD-10-CM | POA: Diagnosis present

## 2013-08-08 LAB — BASIC METABOLIC PANEL
BUN: 9 mg/dL (ref 6–23)
CO2: 32 mEq/L (ref 19–32)
Calcium: 8.9 mg/dL (ref 8.4–10.5)
Creatinine, Ser: 0.88 mg/dL (ref 0.50–1.35)
GFR calc non Af Amer: >90 mL/min (ref >90)
Glucose, Bld: 128 mg/dL — ABNORMAL HIGH (ref 70–99)
Sodium: 136 mEq/L (ref 135–145)

## 2013-08-08 LAB — HEMOGLOBIN A1C: Hgb A1c MFr Bld: 5.8 % — ABNORMAL HIGH (ref <5.7)

## 2013-08-08 MED ORDER — HYDROXYZINE HCL 25 MG PO TABS
25.0000 mg | ORAL_TABLET | Freq: Four times a day (QID) | ORAL | Status: AC | PRN
  Administered 2013-08-09 – 2013-08-10 (×4): 25 mg via ORAL
  Filled 2013-08-08 (×5): qty 1

## 2013-08-08 MED ORDER — CHLORDIAZEPOXIDE HCL 25 MG PO CAPS
25.0000 mg | ORAL_CAPSULE | Freq: Every day | ORAL | Status: AC
  Administered 2013-08-11: 25 mg via ORAL
  Filled 2013-08-08: qty 1

## 2013-08-08 MED ORDER — SERTRALINE HCL 100 MG PO TABS
100.0000 mg | ORAL_TABLET | Freq: Two times a day (BID) | ORAL | Status: DC
  Administered 2013-08-08 – 2013-08-15 (×14): 100 mg via ORAL
  Filled 2013-08-08 (×14): qty 1
  Filled 2013-08-08: qty 14
  Filled 2013-08-08 (×4): qty 1
  Filled 2013-08-08: qty 14
  Filled 2013-08-08: qty 1

## 2013-08-08 MED ORDER — LOPERAMIDE HCL 2 MG PO CAPS
2.0000 mg | ORAL_CAPSULE | ORAL | Status: AC | PRN
  Administered 2013-08-09: 2 mg via ORAL
  Filled 2013-08-08: qty 1

## 2013-08-08 MED ORDER — ACETAMINOPHEN 325 MG PO TABS
650.0000 mg | ORAL_TABLET | Freq: Four times a day (QID) | ORAL | Status: DC | PRN
  Administered 2013-08-09 – 2013-08-14 (×2): 650 mg via ORAL
  Filled 2013-08-08 (×2): qty 2

## 2013-08-08 MED ORDER — ONDANSETRON 4 MG PO TBDP
4.0000 mg | ORAL_TABLET | Freq: Four times a day (QID) | ORAL | Status: AC | PRN
  Administered 2013-08-09: 4 mg via ORAL
  Filled 2013-08-08 (×2): qty 1

## 2013-08-08 MED ORDER — CHLORDIAZEPOXIDE HCL 25 MG PO CAPS
25.0000 mg | ORAL_CAPSULE | Freq: Four times a day (QID) | ORAL | Status: AC | PRN
  Administered 2013-08-08 – 2013-08-10 (×3): 25 mg via ORAL
  Filled 2013-08-08 (×4): qty 1

## 2013-08-08 MED ORDER — MAGNESIUM HYDROXIDE 400 MG/5ML PO SUSP
30.0000 mL | Freq: Every day | ORAL | Status: DC | PRN
  Filled 2013-08-08: qty 30

## 2013-08-08 MED ORDER — TOBRAMYCIN 0.3 % OP OINT
TOPICAL_OINTMENT | Freq: Three times a day (TID) | OPHTHALMIC | Status: AC
  Administered 2013-08-08 – 2013-08-14 (×19): via OPHTHALMIC
  Filled 2013-08-08: qty 3.5

## 2013-08-08 MED ORDER — VITAMIN B-1 100 MG PO TABS
100.0000 mg | ORAL_TABLET | Freq: Every day | ORAL | Status: DC
  Administered 2013-08-09 – 2013-08-15 (×7): 100 mg via ORAL
  Filled 2013-08-08 (×10): qty 1

## 2013-08-08 MED ORDER — TRAZODONE HCL 50 MG PO TABS
50.0000 mg | ORAL_TABLET | Freq: Every evening | ORAL | Status: DC | PRN
  Administered 2013-08-08 (×2): 50 mg via ORAL
  Filled 2013-08-08 (×4): qty 1

## 2013-08-08 MED ORDER — ALUM & MAG HYDROXIDE-SIMETH 200-200-20 MG/5ML PO SUSP
30.0000 mL | ORAL | Status: DC | PRN
  Filled 2013-08-08: qty 30

## 2013-08-08 MED ORDER — THIAMINE HCL 100 MG/ML IJ SOLN: 100.0000 mg | Freq: Once | INTRAMUSCULAR | Status: DC

## 2013-08-08 MED ORDER — TOBRAMYCIN 0.3 % OP OINT
TOPICAL_OINTMENT | Freq: Three times a day (TID) | OPHTHALMIC | Status: DC
  Filled 2013-08-08: qty 3.5

## 2013-08-08 MED ORDER — CHLORDIAZEPOXIDE HCL 25 MG PO CAPS
25.0000 mg | ORAL_CAPSULE | Freq: Three times a day (TID) | ORAL | Status: AC
  Administered 2013-08-09 (×3): 25 mg via ORAL
  Filled 2013-08-08 (×3): qty 1

## 2013-08-08 MED ORDER — GABAPENTIN 300 MG PO CAPS
300.0000 mg | ORAL_CAPSULE | Freq: Three times a day (TID) | ORAL | Status: DC
  Administered 2013-08-08 – 2013-08-15 (×21): 300 mg via ORAL
  Filled 2013-08-08 (×26): qty 1

## 2013-08-08 MED ORDER — ADULT MULTIVITAMIN W/MINERALS CH
1.0000 | ORAL_TABLET | Freq: Every day | ORAL | Status: DC
  Administered 2013-08-08 – 2013-08-15 (×8): 1 via ORAL
  Filled 2013-08-08 (×11): qty 1

## 2013-08-08 MED ORDER — CHLORDIAZEPOXIDE HCL 25 MG PO CAPS
25.0000 mg | ORAL_CAPSULE | ORAL | Status: AC
  Administered 2013-08-10 (×2): 25 mg via ORAL
  Filled 2013-08-08 (×2): qty 1

## 2013-08-08 MED ORDER — CHLORDIAZEPOXIDE HCL 25 MG PO CAPS
25.0000 mg | ORAL_CAPSULE | Freq: Four times a day (QID) | ORAL | Status: AC
  Administered 2013-08-08 (×4): 25 mg via ORAL
  Filled 2013-08-08 (×4): qty 1

## 2013-08-08 NOTE — Progress Notes (Signed)
Attended Group

## 2013-08-08 NOTE — Progress Notes (Signed)
Admission Note:  57 yr male who presents VC in no acute distress for the treatment of ETOH detox, SI, and Depression. Pt appears flat and depressed. Pt was calm and cooperative with admission process. Pt presents with passive SI and contracts for safety upon admission. Pt denies AVH . Pt experienced HI towards sister's step-son. Pt stated he's been drinking 6-10 beers daily since May. Pt states "couldn't take it no more, Got tired of it".    Pt says he is on disability, and since being on disability pt can not afford his Humira and klonipin. Skin was assessed and found to have Psoriasis generalized, Scar Lower back, Scars on R-leg, R-calf, R-thigh, R ankle, L-forearm laceration-with no drainage (dressing reinforced)  A: POC and unit policies explained and understanding verbalized. Consents obtained. Food and fluids offered, and salad accepted.  R: Pt had no additional questions or concerns.

## 2013-08-08 NOTE — Tx Team (Signed)
Interdisciplinary Treatment Plan Update (Adult)  Date: 08/08/2013   Time Reviewed: 10:30 AM  Progress in Treatment:  Attending groups: No.  Participating in groups: No.   Taking medication as prescribed: Yes  Tolerating medication: Yes  Family/Significant othe contact made: Not yet. SPE required for this pt.  Patient understands diagnosis: Yes, AEB seeking treatment for ETOH detox, SI, and mood stabilization.  Discussing patient identified problems/goals with staff: Yes  Medical problems stabilized or resolved: Yes  Denies suicidal/homicidal ideation: Passive SI, able to contract for safety.  Patient has not harmed self or Others: Yes  New problem(s) identified: n/a  Discharge Plan or Barriers: Pt currently not attending d/c planning group and is sleeping in bed. CSW assessing for appropriate referrals and plans to meet with pt when he awakens to complete PSA/discuss aftercare plan.  Additional comments: 57 yr male who presents VC in no acute distress for the treatment of ETOH detox, SI, and Depression. Pt appears flat and depressed. Pt was calm and cooperative with admission process. Pt presents with passive SI and contracts for safety upon admission. Pt denies AVH . Pt experienced HI towards sister's step-son. Pt stated he's been drinking 6-10 beers daily since May. Pt states "couldn't take it no more, Got tired of it".  Pt says he is on disability, and since being on disability pt can not afford his Humira and klonipin.   Reason for Continuation of Hospitalization: Librium taper-withdrawals Mood stabilization Medication management  SI Estimated length of stay: 2-3 days  For review of initial/current patient goals, please see plan of care.  Attendees:  Patient:    Family:    Physician: Geoffery Lyons MD 08/08/2013 10:29 AM   Nursing: Maureen Ralphs RN 08/08/2013 10:29 AM   Clinical Social Worker Bartlomiej Jenkinson Smart, LCSWA  08/08/2013 10:30 AM   Other: [name] RN  08/08/2013 10:30 AM   Other: Darden Dates Nurse CM 08/08/2013 10:30 AM   Other: Massie Kluver, Community Care Coordinator  08/08/2013 10:30 AM   Other:    Scribe for Treatment Team:  [employer] LCSWA 08/08/2013 10:30 AM

## 2013-08-08 NOTE — Progress Notes (Signed)
Patient ID: Cory Foster, male   DOB: 03/05/1956, 57 y.o.   MRN: [id number] Pt did not attend group. He was asleep in his bed.

## 2013-08-08 NOTE — ED Notes (Signed)
Patient ambulatory with steady gait. No acute distress noted. 
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
## 2013-08-08 NOTE — [facility] Group Notes (Signed)
[facility] LCSW Aftercare Discharge Planning Group Note   08/08/2013 10:29 AM  Participation Quality:  DID NOT ATTEND    Smart, Herbert Seta

## 2013-08-08 NOTE — [facility] Suicide Risk Assessment (Signed)
Suicide Risk Assessment  Admission Assessment     Nursing information obtained from:    Demographic factors:    Current Mental Status:    Loss Factors:    Historical Factors:    Risk Reduction Factors:     CLINICAL FACTORS:   Severe Anxiety and/or Agitation Depression:   Comorbid alcohol abuse/dependence Alcohol/Substance Abuse/Dependencies  COGNITIVE FEATURES THAT CONTRIBUTE TO RISK:  Closed-mindedness Polarized thinking Thought constriction (tunnel vision)    SUICIDE RISK:   Moderate:  Frequent suicidal ideation with limited intensity, and duration, some specificity in terms of plans, no associated intent, good self-control, limited dysphoria/symptomatology, some risk factors present, and identifiable protective factors, including available and accessible social support.  PLAN OF CARE: Supportive approach/coping skills/relapse prevention                               Librium Detox protocol                               Reassess and address the co morbidities  I certify that inpatient services furnished can reasonably be expected to improve the patient's condition.  Bartholomew Ramesh A 08/08/2013, 2:04 PM

## 2013-08-08 NOTE — Progress Notes (Addendum)
D: On first approach patient had to come in the hallway to speak with Clinical research associate and security because he was seen on camera putting tape on the camera in the dayroom.  Patient was confronted about putting the tape over the camera and he stated he was just joking around and he was trying to entertain the others.  Patient states he understood that this was unacceptable and he would not do it again.  Patient denies SI/HI and denies AVH. A: Staff to monitor Q 15 mins for safety.  Encouragement and support offered.  Scheduled medications administered per orders. R: Patient remains safe on the unit.  Patient attended group tonight.  Patient visible on the unit and interacting with peers.  Patient taking administered medications.  Patient's behavior improved after he was spoken to by Clinical research associate and security.

## 2013-08-08 NOTE — Progress Notes (Signed)
Patient ID: Cory Foster, male   DOB: 1955-10-03, 57 y.o.   MRN: [id number] He was in bed most of he AM and has been up and to group this afternoon. He denies  SI thoughts.  He has requested and received a prn for anxiety/withdrawals after being in group hearing other stories. Stated that  The family has all  Left him.

## 2013-08-08 NOTE — [facility] Suicide Risk Assessment (Signed)
[facility] INPATIENT:  Family/Significant Other Suicide Prevention Education  Suicide Prevention Education:  Patient Refusal for Family/Significant Other Suicide Prevention Education: The patient Cory Foster has refused to provide written consent for family/significant other to be provided Family/Significant Other Suicide Prevention Education during admission and/or prior to discharge.  Physician notified.  SPE completed with pt. SPI pamphlet provided to pt and he was encouraged to share this information with his support network, ask questions and talk about any concerns.   Smart, Kailany Dinunzio LCSWA 08/08/2013, 3:10 PM
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
## 2013-08-08 NOTE — [facility] Group Notes (Signed)
[facility] LCSW Group Therapy  08/08/2013 2:19 PM  Type of Therapy:  Group Therapy  Participation Level:  None Pt called out of group by MD soon after group began.   Smart, Joanmarie Tsang 08/08/2013, 2:19 PM

## 2013-08-08 NOTE — Care Management Utilization Note (Signed)
Per State Regulation 482.30  The chart was reviewed for necessity with respect to the patient's Admission/ Duration of stay.Admission 08/08/13.  Next Review Date:08/11/13  Lacinda Axon, RN, BSN

## 2013-08-09 LAB — COMPREHENSIVE METABOLIC PANEL
AST: 54 U/L — ABNORMAL HIGH (ref 0–37)
Albumin: 3 g/dL — ABNORMAL LOW (ref 3.5–5.2)
BUN: 11 mg/dL (ref 6–23)
CO2: 32 mEq/L (ref 19–32)
Calcium: 9.1 mg/dL (ref 8.4–10.5)
Creatinine, Ser: 1.14 mg/dL (ref 0.50–1.35)
GFR calc non Af Amer: 70 mL/min — ABNORMAL LOW (ref >90)
Total Protein: 7.3 g/dL (ref 6.0–8.3)

## 2013-08-09 MED ORDER — GABAPENTIN 300 MG PO CAPS
300.0000 mg | ORAL_CAPSULE | Freq: Every day | ORAL | Status: DC
  Administered 2013-08-09 – 2013-08-14 (×6): 300 mg via ORAL
  Filled 2013-08-09 (×8): qty 1

## 2013-08-09 MED ORDER — TRAZODONE HCL 100 MG PO TABS
100.0000 mg | ORAL_TABLET | Freq: Every evening | ORAL | Status: DC | PRN
  Administered 2013-08-09 – 2013-08-10 (×3): 100 mg via ORAL
  Filled 2013-08-09 (×8): qty 1

## 2013-08-09 NOTE — Progress Notes (Signed)
Adult Psychoeducational Group Note  Date:  08/09/2013 Time:  9:39 AM  Group Topic/Focus:  Orientation:   The focus of this group is to educate the patient on the purpose and policies of crisis stabilization and provide a format to answer questions about their admission.  The group details unit policies and expectations of patients while admitted.  Participation Level:  Did Not Attend   Additional Comments:  Pt was in his room.  Caswell Corwin 08/09/2013, 9:39 AM

## 2013-08-09 NOTE — Progress Notes (Addendum)
D: Patient in the hallway on approach.  Patient states he had a horrible day.  Patient states he feels his eye is getting worse and he states he has been using eye gtts and it is not getting any better.  Patient states he is trying to learn while here.  Patient is playful and silly at times.  Patient denies SI/HI and denies AVH. A: Staff to monitor Q 15 mins for safety.  Encouragement and support offered.  Scheduled medications administered per orders.  Vistaril administered prn earlier in ths shift. R: Patient remains safe on the unit.  Patient attended group tonight.  Patient visible on the unit and interacting with peers.  Patient taking administered medications.

## 2013-08-09 NOTE — Progress Notes (Signed)
Recreation Therapy Notes  Date: 11.13.2014 Time: 3:00pm Location: [address] Dayroom  Group Topic: Leisure Education, Team Work  Goal Area(s) Addresses:  Patient will identify positive benefits of leisure.  Behavioral Response: Did not attend   Ketan Renz L Jadynn Epping, LRT/CTRS  Yurem Viner L 08/09/2013 4:28 PM 

## 2013-08-09 NOTE — Progress Notes (Signed)
Patient ID: Cory Foster, male   DOB: July 10, 1956, 57 y.o.   MRN: [id number] He has been up and to groups interacting with peers and staff. Self inventory: depression8 hopelessness 3, SI thoughts off and on, positive withdrawal symptoms and requested and received prn for diarrhea and nausea that has been effective. Continue to encourage him to attend groups.

## 2013-08-09 NOTE — Progress Notes (Signed)
[facility] MD Progress Note  08/09/2013 7:51 PM Cory Foster  MRN:  [id number] Subjective:  Cory Foster continues to detox. He states he is getting to feel better. He is worried about his son who "is Bipolar" and drinks and uses drugs. States his son is going to check into treatment. States he is tired of drinking and what alcohol is doing to him and to his son. He is also concerned about his sister. Admits that he worries a lot Diagnosis:   DSM5: Schizophrenia Disorders:  none Obsessive-Compulsive Disorders:  none Trauma-Stressor Disorders:  none Substance/Addictive Disorders:  Alcohol Related Disorder - Severe (303.90) Depressive Disorders:  Major Depressive Disorder - Moderate (296.22)  Axis I: Generalized Anxiety Disorder  ADL's:  Intact  Sleep: Fair  Appetite:  Fair  Suicidal Ideation:  Plan:  denies Intent:  denies Means:  denies Homicidal Ideation:  Plan:  denies Intent:  denies Means:  denies AEB (as evidenced by):  Psychiatric Specialty Exam: Review of Systems  Constitutional: Negative.   HENT: Negative.   Eyes: Negative.   Respiratory: Negative.   Cardiovascular: Negative.   Gastrointestinal: Negative.   Genitourinary: Negative.   Musculoskeletal: Negative.   Skin: Negative.   Neurological: Negative.   Endo/Heme/Allergies: Negative.   Psychiatric/Behavioral: Positive for depression and substance abuse. The patient is nervous/anxious.     Blood pressure 109/72, pulse 91, temperature 97.4 F (36.3 C), temperature source Oral, resp. rate 16, height 6\' 2"  (1.88 m), weight 115.214 kg (254 lb).Body mass index is 32.6 kg/(m^2).  General Appearance: Disheveled  Eye Solicitor::  Fair  Speech:  Clear and Coherent  Volume:  fluctuates  Mood:  Anxious and worried  Affect:  anxious, worried  Thought Process:  Coherent and Goal Directed  Orientation:  Full (Time, Place, and Person)  Thought Content:  worries, concerns  Suicidal Thoughts:  No  Homicidal Thoughts:  No   Memory:  Immediate;   Fair Recent;   Fair Remote;   Fair  Judgement:  Fair  Insight:  Present  Psychomotor Activity:  Restlessness  Concentration:  Fair  Recall:  Fair  Akathisia:  No  Handed:    AIMS (if indicated):     Assets:  Desire for Improvement  Sleep:  Number of Hours: 4.75   Current Medications: Current Facility-Administered Medications  Medication Dose Route Frequency Provider Last Rate Last Dose  . acetaminophen (TYLENOL) tablet 650 mg  650 mg Oral Q6H PRN Kerry Hough, PA-C   650 mg at 08/09/13 1607  . alum & mag hydroxide-simeth (MAALOX/MYLANTA) 200-200-20 MG/5ML suspension 30 mL  30 mL Oral Q4H PRN Kerry Hough, PA-C      . chlordiazePOXIDE (LIBRIUM) capsule 25 mg  25 mg Oral Q6H PRN Kerry Hough, PA-C   25 mg at 08/08/13 1509  . [START ON 08/10/2013] chlordiazePOXIDE (LIBRIUM) capsule 25 mg  25 mg Oral BH-qamhs Spencer E Simon, PA-C       Followed by  . [START ON 08/11/2013] chlordiazePOXIDE (LIBRIUM) capsule 25 mg  25 mg Oral Daily Kerry Hough, PA-C      . gabapentin (NEURONTIN) capsule 300 mg  300 mg Oral TID Rachael Fee, MD   300 mg at 08/09/13 1607  . gabapentin (NEURONTIN) capsule 300 mg  300 mg Oral QHS Rachael Fee, MD      . hydrOXYzine (ATARAX/VISTARIL) tablet 25 mg  25 mg Oral Q6H PRN Kerry Hough, PA-C   25 mg at 08/09/13 1607  . loperamide (IMODIUM) capsule  2-4 mg  2-4 mg Oral PRN Kerry Hough, PA-C   2 mg at 08/09/13 1201  . magnesium hydroxide (MILK OF MAGNESIA) suspension 30 mL  30 mL Oral Daily PRN Kerry Hough, PA-C      . multivitamin with minerals tablet 1 tablet  1 tablet Oral Daily Kerry Hough, PA-C   1 tablet at 08/09/13 0815  . ondansetron (ZOFRAN-ODT) disintegrating tablet 4 mg  4 mg Oral Q6H PRN Kerry Hough, PA-C   4 mg at 08/09/13 1201  . sertraline (ZOLOFT) tablet 100 mg  100 mg Oral BID Rachael Fee, MD   100 mg at 08/09/13 1607  . thiamine (B-1) injection 100 mg  100 mg Intramuscular Once [employer],  PA-C      . thiamine (VITAMIN B-1) tablet 100 mg  100 mg Oral Daily Kerry Hough, PA-C   100 mg at 08/09/13 0815  . tobramycin (TOBREX) 0.3 % ophthalmic ointment   Right Eye TID Kerry Hough, PA-C      . traZODone (DESYREL) tablet 100 mg  100 mg Oral QHS,MR X 1 Rachael Fee, MD        Lab Results:  Results for orders placed during the hospital encounter of 08/08/13 (from the past 48 hour(s))  HEMOGLOBIN A1C     Status: Abnormal   Collection Time    08/08/13  6:20 AM      Result Value Range   Hemoglobin A1C 5.8 (*) <5.7 %   Comment: (NOTE)                                                                               According to the ADA Clinical Practice Recommendations for 2011, when     HbA1c is used as a screening test:      >=6.5%   Diagnostic of Diabetes Mellitus               (if abnormal result is confirmed)     5.7-6.4%   Increased risk of developing Diabetes Mellitus     References:Diagnosis and Classification of Diabetes Mellitus,Diabetes     Care,2011,34(Suppl 1):S62-S69 and Standards of Medical Care in             Diabetes - 2011,Diabetes Care,2011,34 (Suppl 1):S11-S61.   Mean Plasma Glucose 120 (*) <117 mg/dL   Comment: Performed at [facility]  BASIC METABOLIC PANEL     Status: Abnormal   Collection Time    08/08/13  6:20 AM      Result Value Range   Sodium 136  135 - 145 mEq/L   Potassium 4.2  3.5 - 5.1 mEq/L   Chloride 97  96 - 112 mEq/L   CO2 32  19 - 32 mEq/L   Glucose, Bld 128 (*) 70 - 99 mg/dL   BUN 9  6 - 23 mg/dL   Creatinine, Ser 4.09  0.50 - 1.35 mg/dL   Calcium 8.9  8.4 - 81.1 mg/dL   GFR calc non Af Amer >90  >90 mL/min   GFR calc Af Amer >90  >90 mL/min   Comment: (NOTE)     The eGFR has been calculated  using the CKD EPI equation.     This calculation has not been validated in all clinical situations.     eGFR's persistently <90 mL/min signify possible Chronic Kidney     Disease.     Performed at [facility]     Physical Findings: AIMS: Facial and Oral Movements Muscles of Facial Expression: None, normal Lips and Perioral Area: None, normal Jaw: None, normal Tongue: None, normal,Extremity Movements Upper (arms, wrists, hands, fingers): None, normal Lower (legs, knees, ankles, toes): None, normal, Trunk Movements Neck, shoulders, hips: None, normal, Overall Severity Severity of abnormal movements (highest score from questions above): None, normal Incapacitation due to abnormal movements: None, normal Patient's awareness of abnormal movements (rate only patient's report): No Awareness, Dental Status Current problems with teeth and/or dentures?: No Does patient usually wear dentures?: No  CIWA:  CIWA-Ar Total: 5 COWS:  COWS Total Score: 2  Treatment Plan Summary: Daily contact with patient to assess and evaluate symptoms and progress in treatment Medication management  Plan: Supportive approach/coping skills/relapse prevention           Complete the detox          Reassess and address the co morbidities  Medical Decision Making Problem Points:  Review of psycho-social stressors (1) Data Points:  Review of medication regiment & side effects (2)  I certify that inpatient services furnished can reasonably be expected to improve the patient's condition.   Sara Keys A 08/09/2013, 7:51 PM

## 2013-08-09 NOTE — Progress Notes (Signed)
Recreation Therapy Notes  Date: 11.13.2014 Time: 2:30pm Location: [address] Dayroom  Group Topic: Software engineer Activities (AAA)  Behavioral Response: Did not attend.   Marykay Lex Mikah Poss, LRT/CTRS  Jearl Klinefelter 08/09/2013 4:44 PM

## 2013-08-09 NOTE — Progress Notes (Signed)
Attended group 

## 2013-08-09 NOTE — Progress Notes (Signed)
Pt attended AA speaker meeting. 

## 2013-08-09 NOTE — Progress Notes (Signed)
Adult Psychoeducational Group Note  Date:  08/09/2013 Time:  3:10 PM  Group Topic/Focus:  Overcoming Stress:   The focus of this group is to define stress and help patients assess their triggers.  Participation Level:  Minimal  Participation Quality:  Appropriate  Affect:  Depressed  Cognitive:  Oriented  Insight: Limited  Engagement in Group:  Resistant  Modes of Intervention:  Discussion, Education and Support  Additional Comments:  Pt attended group but his involvement was limited.  Reynolds Bowl 08/09/2013, 3:10 PM

## 2013-08-09 NOTE — [facility] Group Notes (Signed)
[facility] LCSW Group Therapy  08/09/2013 2:34 PM  Type of Therapy:  Group Therapy  Participation Level:  Minimal  Participation Quality:  Attentive  Affect:  Blunted  Cognitive:  Alert  Insight:  Limited  Engagement in Therapy:  Limited  Modes of Intervention:  Confrontation, Discussion, Education, Exploration, Socialization and Support  Summary of Progress/Problems:  Finding Balance in Life. Today's group focused on defining balance in one's own words, identifying things that can knock one off balance, and exploring healthy ways to maintain balance in life. Group members were asked to provide an example of a time when they felt off balance, describe how they handled that situation,and process healthier ways to regain balance in the future. Group members were asked to share the most important tool for maintaining balance that they learned while at [facility] and how they plan to apply this method after discharge. Cory Foster was attentive throughout today's group. He minimally participated but actively listened as others participated in group discussion. Cory Foster stated that his family problems were a huge trigger for him that led to heavy drinking and imbalance. Cory Foster talked about the guild he feels regarding his son and how his son has been following in his footsteps. Cory Foster shows limited progress in the group setting AEB his difficulty in identifying/exploring what "life balance" would look like for him. Cory Foster stated that he plans to rejoin his family church in order to increase his positive social network and help in reestablishing a sense of balance in this area of his life.     Smart, Cory Foster 08/09/2013, 2:34 PM

## 2013-08-10 LAB — FOLATE: Folate: 4.9 ng/mL

## 2013-08-10 LAB — VITAMIN B12: Vitamin B-12: 736 pg/mL (ref 211–911)

## 2013-08-10 MED ORDER — QUETIAPINE FUMARATE 100 MG PO TABS
100.0000 mg | ORAL_TABLET | Freq: Every day | ORAL | Status: DC
  Administered 2013-08-10 – 2013-08-14 (×5): 100 mg via ORAL
  Filled 2013-08-10 (×5): qty 1
  Filled 2013-08-10: qty 14
  Filled 2013-08-10: qty 1

## 2013-08-10 MED ORDER — RAMELTEON 8 MG PO TABS
8.0000 mg | ORAL_TABLET | Freq: Every day | ORAL | Status: DC
  Administered 2013-08-10 – 2013-08-14 (×5): 8 mg via ORAL
  Filled 2013-08-10 (×7): qty 1
  Filled 2013-08-10: qty 14

## 2013-08-10 NOTE — Progress Notes (Signed)
Patient ID: Cory Foster, male   DOB: March 31, 1956, 57 y.o.   MRN: [id number] D. Patient presents with depressed mood, irritable affect. Patient in am stating '' I don't want to fill that sheet out, you need to talk to the doctor and get me something else for sleep. I took that medication and I was still up at four am . And also my eye is still really bothering me, I've got those drops but I don't think they're doing anything. '' Patient completed self inventory and rates depression at 7/10 on depression scale, 10 being worst depression 1 being least. Patient also endorses fleeting suicidal ideation, but is able to contract for safety. Pt endorses hopelessness at same scale on self inventory, 10 being worst and 1 being least. A. Support and encouragement provided. Encouraged pt to discuss concerns with MD., and writer discussed above with treatment team/MD. Medications given as ordered. R. Patient has been visible on the unit. In no acute distress at this time. Will continue to monitor q 15 minutes for safety.

## 2013-08-10 NOTE — [facility] Group Notes (Signed)
[facility] LCSW Aftercare Discharge Planning Group Note   08/10/2013 9:36 AM  Participation Quality:  DID NOT ATTEND    Smart, Lebron Quam

## 2013-08-10 NOTE — Telephone Encounter (Signed)
Called.

## 2013-08-10 NOTE — Progress Notes (Signed)
D.  Pt anxious and depressed on approach.  Pt states he can't sleep and hasn't in several days.  However Pt did state that he slept all day because he couldn't last night.  Pt refusing Trazodone this evening stating that it doesn't work for him.  He states he will just take the ordered Seroquel in hopes that this will work.  Pt states "that is why I'm here, I can't sleep".  Denies SI/HI/hallucinations.  Still reporting quite a bit of discomfort with his right eye.  Wishes to take bedtime medication as early as he can get it.  A.  Support and encouragement offered.  Medication given as ordered  R.  Pt remains safe on unit, will continue to monitor.

## 2013-08-10 NOTE — [facility] Group Notes (Signed)
[facility] LCSW Group Therapy  08/10/2013 2:20 PM  Type of Therapy:  Group Therapy  Participation Level:  Active  Participation Quality:  Attentive  Affect:  Appropriate  Cognitive:  Alert and Oriented  Insight:  Engaged  Engagement in Therapy:  Engaged  Modes of Intervention:  Confrontation, Discussion, Education, Exploration, Socialization and Support  Summary of Progress/Problems: Feelings around Relapse. Group members discussed the meaning of relapse and shared personal stories of relapse, how it affected them and others, and how they perceived themselves during this time. Group members were encouraged to identify triggers, warning signs and coping skills used when facing the possibility of relapse. Social supports were discussed and explored in detail. Post Acute Withdrawal Syndrome (handout provided) was introduced and examined. Pt's were encouraged to ask questions, talk about key points associated with PAWS, and process this information in terms of relapse prevention. Gregor was attentive and engaged throughout today's therapy group. Newman identified relapse as "going out and drinking or drugging after not doing it for awhile." He stated that relapse can also mean losing control of emotions and acting out of "aggravation or anger." Daltin shows progress in the group setting AEB his ability to actively participate in group discussion and provide emotional support/encouragement to other group members. Mickell shows improving insight AEB his ability to identify feelings around relapse "embarressment, shame, and guilit" and process how he can use his past experiences with sobriety to create a recovery plan for the future. "I have to get away from negative people and influences. My son and I are going into inpatient treatment to learn new skills and are moving out of the negative environment we currently live in afterward." Numan also identified o/p groups offered by [facility] that are  not AA/12step based as groups that work well for him and helped in during recovery in the past.    Smart, [place] LCSWA 08/10/2013, 2:20 PM

## 2013-08-10 NOTE — Tx Team (Signed)
Interdisciplinary Treatment Plan Update (Adult)  Date: 08/10/2013   Time Reviewed: 10:29 AM  Progress in Treatment:  Attending groups: Intermittently  Participating in groups: Minimally  Taking medication as prescribed: Yes  Tolerating medication: Yes  Family/Significant othe contact made: SPE completed with pt as he did not consent to family contact.  Patient understands diagnosis: Yes, AEB seeking treatment for ETOH detox, SI, and mood stabilization.  Discussing patient identified problems/goals with staff: Yes  Medical problems stabilized or resolved: Yes  Denies suicidal/homicidal ideation: Yes, during group.  Patient has not harmed self or Others: Yes  New problem(s) identified: n/a  Discharge Plan or Barriers: Pt has followup with Dr. Epimenio Foot 12/17 for med management/follow-up and plans to go to [facility] for screening and possible admit.  Additional comments: n/a Reason for Continuation of Hospitalization: Librium taper-withdrawals Mood stabilization Medication management  Estimated length of stay: 2-3 days  For review of initial/current patient goals, please see plan of care.  Attendees:  Patient:    Family:    Physician: Geoffery Lyons MD 08/10/2013 10:29 AM   Nursing: Mardella Layman RN  08/10/2013 10:29 AM   Clinical Social Worker Galdino Hinchman Smart, LCSWA  08/10/2013 10:29 AM   Other:  08/10/2013 10:29 AM   Other: Darden Dates Nurse CM 08/10/2013 10:29 AM   Other: Massie Kluver, Community Care Coordinator  08/10/2013 10:29 AM   Other:    Scribe for Treatment Team:  [employer] LCSWA 08/10/2013 10:29 AM

## 2013-08-10 NOTE — Progress Notes (Signed)
Adult Psychoeducational Group Note  Date:  08/10/2013 Time:  10:35 AM  Group Topic/Focus:  Therapeutic Activity  Participation Level:  Did Not Attend    Cory Foster 08/10/2013, 10:35 AM

## 2013-08-10 NOTE — Progress Notes (Signed)
[facility] MD Progress Note  08/10/2013 3:56 PM Cory Foster  MRN:  [id number] Subjective:  Did not sleep well last night. He is feeling anxious, depressed, worried,concern about his son who is now trying to get help himself. They are planning to stay together. He is aware that if his son was not ready to deal with his addiction and mental illness he would have to let him go as he will be putting gin jeopardy his own Recovery (teary eyed when discussing this possibility) Diagnosis:   DSM5: Schizophrenia Disorders:  none Obsessive-Compulsive Disorders:  none Trauma-Stressor Disorders:  none Substance/Addictive Disorders:  Alcohol Related Disorder - Severe (303.90) Depressive Disorders:  Major Depressive Disorder - Moderate (296.22)  Axis I: Generalized Anxiety Disorder  ADL's:  Intact  Sleep: Poor  Appetite:  Fair  Suicidal Ideation:  Plan:  denies Intent:  denies Means:  denies Homicidal Ideation:  Plan:  denies Intent:  denies Means:  denies AEB (as evidenced by):  Psychiatric Specialty Exam: Review of Systems  Constitutional: Positive for malaise/fatigue.  HENT: Negative.   Eyes: Negative.   Respiratory: Negative.   Cardiovascular: Negative.   Gastrointestinal: Negative.   Genitourinary: Negative.   Musculoskeletal: Negative.   Skin: Negative.   Neurological: Negative.   Endo/Heme/Allergies: Negative.   Psychiatric/Behavioral: Positive for depression and substance abuse. The patient has insomnia.     Blood pressure 93/61, pulse 86, temperature 97.4 F (36.3 C), temperature source Oral, resp. rate 20, height 6\' 2"  (1.88 m), weight 115.214 kg (254 lb).Body mass index is 32.6 kg/(m^2).  General Appearance: Disheveled  Eye Solicitor::  Fair  Speech:  Clear and Coherent  Volume:  fluctuates  Mood:  Anxious and Depressed  Affect:  Depressed, Tearful and anxious  Thought Process:  Coherent and Goal Directed  Orientation:  Full (Time, Place, and Person)  Thought Content:   Rumination and worries, concerns  Suicidal Thoughts:  No  Homicidal Thoughts:  No  Memory:  Immediate;   Fair Recent;   Fair Remote;   Fair  Judgement:  Fair  Insight:  Present and Shallow  Psychomotor Activity:  Restlessness  Concentration:  Fair  Recall:  Fair  Akathisia:  No  Handed:    AIMS (if indicated):     Assets:  Desire for Improvement  Sleep:  Number of Hours: 5.25   Current Medications: Current Facility-Administered Medications  Medication Dose Route Frequency Provider Last Rate Last Dose  . acetaminophen (TYLENOL) tablet 650 mg  650 mg Oral Q6H PRN Kerry Hough, PA-C   650 mg at 08/09/13 1607  . alum & mag hydroxide-simeth (MAALOX/MYLANTA) 200-200-20 MG/5ML suspension 30 mL  30 mL Oral Q4H PRN Kerry Hough, PA-C      . chlordiazePOXIDE (LIBRIUM) capsule 25 mg  25 mg Oral Q6H PRN Kerry Hough, PA-C   25 mg at 08/10/13 0524  . chlordiazePOXIDE (LIBRIUM) capsule 25 mg  25 mg Oral BH-qamhs Kerry Hough, PA-C   25 mg at 08/10/13 7829   Followed by  . [START ON 08/11/2013] chlordiazePOXIDE (LIBRIUM) capsule 25 mg  25 mg Oral Daily Kerry Hough, PA-C      . gabapentin (NEURONTIN) capsule 300 mg  300 mg Oral TID Rachael Fee, MD   300 mg at 08/10/13 1211  . gabapentin (NEURONTIN) capsule 300 mg  300 mg Oral QHS Rachael Fee, MD   300 mg at 08/09/13 2142  . hydrOXYzine (ATARAX/VISTARIL) tablet 25 mg  25 mg Oral Q6H PRN Karleen Hampshire  E Simon, PA-C   25 mg at 08/10/13 0827  . loperamide (IMODIUM) capsule 2-4 mg  2-4 mg Oral PRN Kerry Hough, PA-C   2 mg at 08/09/13 1201  . magnesium hydroxide (MILK OF MAGNESIA) suspension 30 mL  30 mL Oral Daily PRN Kerry Hough, PA-C      . multivitamin with minerals tablet 1 tablet  1 tablet Oral Daily Kerry Hough, PA-C   1 tablet at 08/10/13 5284  . ondansetron (ZOFRAN-ODT) disintegrating tablet 4 mg  4 mg Oral Q6H PRN Kerry Hough, PA-C   4 mg at 08/09/13 1201  . QUEtiapine (SEROQUEL) tablet 100 mg  100 mg Oral QHS  Rachael Fee, MD      . sertraline (ZOLOFT) tablet 100 mg  100 mg Oral BID Rachael Fee, MD   100 mg at 08/10/13 0827  . thiamine (B-1) injection 100 mg  100 mg Intramuscular Once [employer], PA-C      . thiamine (VITAMIN B-1) tablet 100 mg  100 mg Oral Daily Kerry Hough, PA-C   100 mg at 08/10/13 0827  . tobramycin (TOBREX) 0.3 % ophthalmic ointment   Right Eye TID Kerry Hough, PA-C      . traZODone (DESYREL) tablet 100 mg  100 mg Oral QHS,MR X 1 Rachael Fee, MD   100 mg at 08/09/13 2233    Lab Results:  Results for orders placed during the hospital encounter of 08/08/13 (from the past 48 hour(s))  VITAMIN B12     Status: None   Collection Time    08/09/13  7:36 PM      Result Value Range   Vitamin B-12 736  211 - 911 pg/mL   Comment: Performed at [facility]  FOLATE     Status: None   Collection Time    08/09/13  7:36 PM      Result Value Range   Folate 4.9     Comment: (NOTE)     Reference Ranges            Deficient:       0.4 - 3.3 ng/mL            Indeterminate:   3.4 - 5.4 ng/mL            Normal:              > 5.4 ng/mL     Performed at [facility]  COMPREHENSIVE METABOLIC PANEL     Status: Abnormal   Collection Time    08/09/13  7:36 PM      Result Value Range   Sodium 140  135 - 145 mEq/L   Potassium 5.1  3.5 - 5.1 mEq/L   Comment: DELTA CHECK NOTED     NO VISIBLE HEMOLYSIS     REPEATED TO VERIFY   Chloride 100  96 - 112 mEq/L   CO2 32  19 - 32 mEq/L   Glucose, Bld 109 (*) 70 - 99 mg/dL   BUN 11  6 - 23 mg/dL   Creatinine, Ser 1.32  0.50 - 1.35 mg/dL   Calcium 9.1  8.4 - 44.0 mg/dL   Total Protein 7.3  6.0 - 8.3 g/dL   Albumin 3.0 (*) 3.5 - 5.2 g/dL   AST 54 (*) 0 - 37 U/L   ALT 32  0 - 53 U/L   Alkaline Phosphatase 87  39 - 117 U/L   Total  Bilirubin 0.5  0.3 - 1.2 mg/dL   GFR calc non Af Amer 70 (*) >90 mL/min   GFR calc Af Amer 81 (*) >90 mL/min   Comment: (NOTE)     The eGFR has been calculated using the CKD EPI  equation.     This calculation has not been validated in all clinical situations.     eGFR's persistently <90 mL/min signify possible Chronic Kidney     Disease.     Performed at [facility]    Physical Findings: AIMS: Facial and Oral Movements Muscles of Facial Expression: None, normal Lips and Perioral Area: None, normal Jaw: None, normal Tongue: None, normal,Extremity Movements Upper (arms, wrists, hands, fingers): None, normal Lower (legs, knees, ankles, toes): None, normal, Trunk Movements Neck, shoulders, hips: None, normal, Overall Severity Severity of abnormal movements (highest score from questions above): None, normal Incapacitation due to abnormal movements: None, normal Patient's awareness of abnormal movements (rate only patient's report): No Awareness, Dental Status Current problems with teeth and/or dentures?: No Does patient usually wear dentures?: No  CIWA:  CIWA-Ar Total: 3 COWS:  COWS Total Score: 2  Treatment Plan Summary: Daily contact with patient to assess and evaluate symptoms and progress in treatment Medication management  Plan: Supportive approach/coping skills/elapse prevention           Will try Seroquel at night, he has tried it successfully in the past           Continue detox Medical Decision Making Problem Points:  Review of last therapy session (1) Data Points:  Review of new medications or change in dosage (2)  I certify that inpatient services furnished can reasonably be expected to improve the patient's condition.   Emberli Ballester A 08/10/2013, 3:56 PM

## 2013-08-10 NOTE — Progress Notes (Signed)
Patient ID: Cory Foster, male   DOB: January 12, 1956, 57 y.o.   MRN: [id number] Pt did not attend group. He was asleep in bed.

## 2013-08-11 DIAGNOSIS — F411 Generalized anxiety disorder: Secondary | ICD-10-CM

## 2013-08-11 DIAGNOSIS — F332 Major depressive disorder, recurrent severe without psychotic features: Secondary | ICD-10-CM

## 2013-08-11 DIAGNOSIS — F1010 Alcohol abuse, uncomplicated: Secondary | ICD-10-CM

## 2013-08-11 MED ORDER — HYDROXYZINE HCL 25 MG PO TABS
25.0000 mg | ORAL_TABLET | Freq: Four times a day (QID) | ORAL | Status: DC | PRN
  Administered 2013-08-11 – 2013-08-12 (×2): 25 mg via ORAL
  Filled 2013-08-11 (×3): qty 1

## 2013-08-11 NOTE — [facility] Group Notes (Signed)
[facility] Group Notes:  (Nursing/MHT/Case Management/Adjunct)  Date:  08/11/2013  Time:  1315  Type of Therapy:     Participation Level:  Did Not Attend  Participation Quality:     Affect:    Cognitive:    Insight:    Engagement in Group:    Modes of Intervention:    Summary of Progress/Problems:  Cory Foster 08/11/2013, 4:24 PM

## 2013-08-11 NOTE — Progress Notes (Signed)
Spoke with pt 1:1 who remains disheveled and anxious. Pt looks much older than stated age. He continues to say sleep isn't as restful as he would like. Mild tremor noted. CIWA "5" with VSS. Med education provided as well as support. Pointed out to pt that during detox period sleep may not be as restful as he would like. Pt receptive. When asked about SI pt states, "well, no, not yet." No HI/AVH voiced and pt remains safe. Lawrence Marseilles

## 2013-08-11 NOTE — Progress Notes (Signed)
D- Patient has c/o anxiety r/t withdrawal this shift. He has isolated to room and has had no group participation.  Bird is eating and taking fluids.  Requested and received prn medications for anxiety.  A- Support and encouragement offered.  Encourage fluids.  Continue current POC and evaluation of treatment goals.  Continue 15' checks for safety.  R- Safety maintained.

## 2013-08-11 NOTE — Progress Notes (Signed)
[facility] MD Progress Note  08/11/2013 2:52 PM Cory Foster  MRN:  [id number] Subjective:  Patient complaining of withdrawal symptoms but vital signs are stable, no tremors--Vistaril 25 mg PRN anxiety ordered, patient sleeping prior to assessment, close monitoring continues for withdrawal symptoms, appetite is "good", depression "sucks"--"I am really, really depressed".  When asked about suicidal ideations, "Who knows". 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 Anxiety   . Depression   . Varicose vein    Axis IV: other psychosocial or environmental problems, problems related to social environment and problems with primary support group Axis V: 41-50 serious symptoms  ADL's:  Intact  Sleep: Fair  Appetite:  Good  Suicidal Ideation:  Plan:  drink himself to death Intent:  yes Means:  none Homicidal Ideation:  Denies  Psychiatric Specialty Exam: Review of Systems  Constitutional: Negative.   HENT: Negative.   Eyes: Negative.   Respiratory: Negative.   Cardiovascular: Negative.   Gastrointestinal: Negative.   Genitourinary: Negative.   Musculoskeletal: Negative.   Skin: Negative.   Neurological: Negative.   Endo/Heme/Allergies: Negative.   Psychiatric/Behavioral: Positive for depression and substance abuse. The patient is nervous/anxious.     Blood pressure 101/60, pulse 84, temperature 97.7 F (36.5 C), temperature source Oral, resp. rate 18, height 6\' 2"  (1.88 m), weight 115.214 kg (254 lb).Body mass index is 32.6 kg/(m^2).  General Appearance: Disheveled  Eye Solicitor::  Fair  Speech:  Normal Rate  Volume:  Normal  Mood:  Anxious and  Depressed  Affect:  Congruent  Thought Process:  Coherent  Orientation:  Full (Time, Place, and Person)  Thought Content:  WDL  Suicidal Thoughts:  Yes.  with intent/plan  Homicidal Thoughts:  No  Memory:  Immediate;   Fair Recent;   Fair Remote;   Fair  Judgement:  Poor  Insight:  Fair  Psychomotor Activity:  Decreased  Concentration:  Fair  Recall:  Fair  Akathisia:  No  Handed:  Right  AIMS (if indicated):     Assets:  Resilience  Sleep:  Number of Hours: 6   Current Medications: Current Facility-Administered Medications  Medication Dose Route Frequency Provider Last Rate Last Dose  . acetaminophen (TYLENOL) tablet 650 mg  650 mg Oral Q6H PRN Kerry Hough, PA-C   650 mg at 08/09/13 1607  . alum & mag hydroxide-simeth (MAALOX/MYLANTA) 200-200-20 MG/5ML suspension 30 mL  30 mL Oral Q4H PRN Kerry Hough, PA-C      . gabapentin (NEURONTIN) capsule 300 mg  300 mg Oral TID Rachael Fee, MD   300 mg at 08/11/13 1206  . gabapentin (NEURONTIN) capsule 300 mg  300 mg Oral QHS Rachael Fee, MD   300 mg at 08/10/13 2137  . hydrOXYzine (ATARAX/VISTARIL) tablet 25 mg  25 mg Oral Q6H PRN Nanine Means, NP      . magnesium hydroxide (MILK OF MAGNESIA) suspension 30 mL  30 mL Oral Daily PRN Kerry Hough, PA-C      . multivitamin with minerals tablet 1 tablet  1 tablet Oral Daily Kerry Hough, PA-C   1 tablet at 08/11/13 5784  . QUEtiapine (SEROQUEL) tablet 100 mg  100 mg Oral  QHS Rachael Fee, MD   100 mg at 08/10/13 2137  . ramelteon (ROZEREM) tablet 8 mg  8 mg Oral QHS Kristeen Mans, NP   8 mg at 08/10/13 2220  . sertraline (ZOLOFT) tablet 100 mg  100 mg Oral BID Rachael Fee, MD   100 mg at 08/11/13 2130  . thiamine (B-1) injection 100 mg  100 mg Intramuscular Once [employer], PA-C      . thiamine (VITAMIN B-1) tablet 100 mg  100 mg Oral Daily Kerry Hough, PA-C   100 mg at 08/11/13 8657  . tobramycin (TOBREX) 0.3 % ophthalmic ointment   Right Eye TID Kerry Hough,  PA-C      . traZODone (DESYREL) tablet 100 mg  100 mg Oral QHS,MR X 1 Rachael Fee, MD   100 mg at 08/10/13 2244    Lab Results:  Results for orders placed during the hospital encounter of 08/08/13 (from the past 48 hour(s))  VITAMIN B12     Status: None   Collection Time    08/09/13  7:36 PM      Result Value Range   Vitamin B-12 736  211 - 911 pg/mL   Comment: Performed at [facility]  FOLATE     Status: None   Collection Time    08/09/13  7:36 PM      Result Value Range   Folate 4.9     Comment: (NOTE)     Reference Ranges            Deficient:       0.4 - 3.3 ng/mL            Indeterminate:   3.4 - 5.4 ng/mL            Normal:              > 5.4 ng/mL     Performed at [facility]  COMPREHENSIVE METABOLIC PANEL     Status: Abnormal   Collection Time    08/09/13  7:36 PM      Result Value Range   Sodium 140  135 - 145 mEq/L   Potassium 5.1  3.5 - 5.1 mEq/L   Comment: DELTA CHECK NOTED     NO VISIBLE HEMOLYSIS     REPEATED TO VERIFY   Chloride 100  96 - 112 mEq/L   CO2 32  19 - 32 mEq/L   Glucose, Bld 109 (*) 70 - 99 mg/dL   BUN 11  6 - 23 mg/dL   Creatinine, Ser 8.46  0.50 - 1.35 mg/dL   Calcium 9.1  8.4 - 96.2 mg/dL   Total Protein 7.3  6.0 - 8.3 g/dL   Albumin 3.0 (*) 3.5 - 5.2 g/dL   AST 54 (*) 0 - 37 U/L   ALT 32  0 - 53 U/L   Alkaline Phosphatase 87  39 - 117 U/L   Total Bilirubin 0.5  0.3 - 1.2 mg/dL   GFR calc non Af Amer 70 (*) >90 mL/min   GFR calc Af Amer 81 (*) >90 mL/min   Comment: (NOTE)     The eGFR has been calculated using the CKD EPI equation.     This calculation has not been validated in all clinical situations.     eGFR's persistently <90 mL/min signify possible Chronic Kidney     Disease.     Performed at [facility]    Physical  Findings: AIMS: Facial and Oral Movements Muscles of Facial Expression: None, normal Lips and Perioral Area: None, normal Jaw: None, normal Tongue: None, normal,Extremity  Movements Upper (arms, wrists, hands, fingers): None, normal Lower (legs, knees, ankles, toes): None, normal, Trunk Movements Neck, shoulders, hips: None, normal, Overall Severity Severity of abnormal movements (highest score from questions above): None, normal Incapacitation due to abnormal movements: None, normal Patient's awareness of abnormal movements (rate only patient's report): No Awareness, Dental Status Current problems with teeth and/or dentures?: No Does patient usually wear dentures?: No  CIWA:  CIWA-Ar Total: 7 COWS:  COWS Total Score: 2  Treatment Plan Summary: Daily contact with patient to assess and evaluate symptoms and progress in treatment Medication management  Plan:  Review of chart, vital signs, medications, and notes. 1-Individual and group therapy 2-Medication management for depression, alcohol abuse, and anxiety:  Medications reviewed with the patient and Vistaril ordered to help his anxiety  3-Coping skills for depression, anxiety, and pain 4-Continue crisis stabilization and management 5-Address health issues--monitoring vital signs, stable 6-Treatment plan in progress to prevent relapse of depression, alcohol abuse, and anxiety  Medical Decision Making Problem Points:  Established problem, stable/improving (1) and Review of psycho-social stressors (1) Data Points:  Review of new medications or change in dosage (2)  I certify that inpatient services furnished can reasonably be expected to improve the patient's condition.   Nanine Means, PMH-NP 08/11/2013, 2:52 PM  Reviewed the information documented and agree with the treatment plan.  Takao Lizer,JANARDHAHA R. 08/11/2013 5:07 PM

## 2013-08-11 NOTE — Progress Notes (Signed)
Pt attended AA group 

## 2013-08-11 NOTE — [facility] Group Notes (Signed)
[facility] Group Notes: (Clinical Social Work)   08/11/2013      Type of Therapy:  Group Therapy   Participation Level:  Did Not Attend    Ambrose Mantle, LCSW 08/11/2013, 12:04 PM

## 2013-08-12 NOTE — Progress Notes (Signed)
D-Patient remains flat and guarded.  He was encouraged to bathe which he did.  Came to group late.  Denies SI.  A- Support and encouragement offered.  Continue POC and evaluation of treatment. Continue 15' checks for safety.  R- Safety maintained.

## 2013-08-12 NOTE — Progress Notes (Signed)
[facility] MD Progress Note  08/12/2013 1:04 PM Cory Foster  MRN:  [id number] Subjective:  Patient stated he slept a "little bit", depression is worse without sleep, when asked about suicidal ideations--he just shrugged his shoulders, forwards little information. 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 Anxiety   . Depression   . Varicose vein    Axis IV: economic problems, housing problems, other psychosocial or environmental problems, problems related to social environment and problems with primary support group Axis V: 41-50 serious symptoms  ADL's:  Intact  Sleep: Poor  Appetite:  Fair  Suicidal Ideation:  Vague, would not answer Homicidal Ideation:  Denies  Psychiatric Specialty Exam: Review of Systems  Constitutional: Negative.   HENT: Negative.   Eyes: Negative.   Respiratory: Negative.   Cardiovascular: Negative.   Gastrointestinal: Negative.   Genitourinary: Negative.   Musculoskeletal: Negative.   Skin: Negative.   Neurological: Negative.   Endo/Heme/Allergies: Negative.     Blood pressure 110/69, pulse 88, temperature 98 F (36.7 C), temperature source Oral, resp. rate 20, height 6\' 2"  (1.88 m), weight 115.214 kg (254 lb).Body mass index is 32.6 kg/(m^2).  General Appearance: Disheveled  Eye Contact::  Minimal  Speech:  Slow  Volume:  Decreased  Mood:  Anxious and Depressed  Affect:  Congruent  Thought Process:  Coherent  Orientation:  Full (Time, Place, and Person)  Thought Content:  WDL  Suicidal Thoughts:  Yes.  with intent/plan  Homicidal Thoughts:  No  Memory:  Immediate;   Fair Recent;   Fair Remote;   Fair   Judgement:  Poor  Insight:  Fair  Psychomotor Activity:  Decreased  Concentration:  Fair  Recall:  Fair  Akathisia:  No  Handed:  Right  AIMS (if indicated):     Assets:  Resilience  Sleep:  Number of Hours: 6.5   Current Medications: Current Facility-Administered Medications  Medication Dose Route Frequency Provider Last Rate Last Dose  . acetaminophen (TYLENOL) tablet 650 mg  650 mg Oral Q6H PRN Kerry Hough, PA-C   650 mg at 08/09/13 1607  . alum & mag hydroxide-simeth (MAALOX/MYLANTA) 200-200-20 MG/5ML suspension 30 mL  30 mL Oral Q4H PRN Kerry Hough, PA-C      . gabapentin (NEURONTIN) capsule 300 mg  300 mg Oral TID Rachael Fee, MD   300 mg at 08/12/13 1213  . gabapentin (NEURONTIN) capsule 300 mg  300 mg Oral QHS Rachael Fee, MD   300 mg at 08/11/13 2130  . hydrOXYzine (ATARAX/VISTARIL) tablet 25 mg  25 mg Oral Q6H PRN Nanine Means, NP   25 mg at 08/12/13 0209  . magnesium hydroxide (MILK OF MAGNESIA) suspension 30 mL  30 mL Oral Daily PRN Kerry Hough, PA-C      . multivitamin with minerals tablet 1 tablet  1 tablet Oral Daily Kerry Hough, PA-C   1 tablet at 08/12/13 1478  . QUEtiapine (SEROQUEL) tablet 100 mg  100 mg Oral QHS Rachael Fee, MD   100 mg at 08/11/13 2130  . ramelteon (ROZEREM) tablet 8 mg  8 mg Oral QHS Kristeen Mans, NP   8 mg at 08/11/13 2130  .  sertraline (ZOLOFT) tablet 100 mg  100 mg Oral BID Rachael Fee, MD   100 mg at 08/12/13 5784  . thiamine (B-1) injection 100 mg  100 mg Intramuscular Once [employer], PA-C      . thiamine (VITAMIN B-1) tablet 100 mg  100 mg Oral Daily Kerry Hough, PA-C   100 mg at 08/12/13 1213  . tobramycin (TOBREX) 0.3 % ophthalmic ointment   Right Eye TID Kerry Hough, PA-C        Lab Results: No results found for this or any previous visit (from the past 48 hour(s)).  Physical Findings: AIMS: Facial and Oral Movements Muscles of Facial Expression: None, normal Lips and Perioral Area: None,  normal Jaw: None, normal Tongue: None, normal,Extremity Movements Upper (arms, wrists, hands, fingers): None, normal Lower (legs, knees, ankles, toes): None, normal, Trunk Movements Neck, shoulders, hips: None, normal, Overall Severity Severity of abnormal movements (highest score from questions above): None, normal Incapacitation due to abnormal movements: None, normal Patient's awareness of abnormal movements (rate only patient's report): No Awareness, Dental Status Current problems with teeth and/or dentures?: No Does patient usually wear dentures?: No  CIWA:  CIWA-Ar Total: 4 COWS:  COWS Total Score: 2  Treatment Plan Summary: Daily contact with patient to assess and evaluate symptoms and progress in treatment Medication management  Plan:  Review of chart, vital signs, medications, and notes. 1-Individual and group therapy 2-Medication management for depression, alcohol abuse, and anxiety:  Medications reviewed with the patient and he stated no untoward effects, no changes made 3-Coping skills for depression, anxiety, and alcohol abuse 4-Continue crisis stabilization and management 5-Address health issues--monitoring vital signs, stable 6-Treatment plan in progress to prevent relapse of depression, alcohol abuse, and anxiety  Medical Decision Making Problem Points:  Established problem, stable/improving (1) and Review of psycho-social stressors (1) Data Points:  Review of medication regiment & side effects (2)  I certify that inpatient services furnished can reasonably be expected to improve the patient's condition.   Nanine Means, PMH-NP 08/12/2013, 1:04 PM  Reviewed the information documented and agree with the treatment plan.  Ryett Hamman,JANARDHAHA R. 08/13/2013 8:37 AM

## 2013-08-12 NOTE — [facility] Group Notes (Signed)
[facility] Group Notes: (Clinical Social Work)   08/12/2013      Type of Therapy:  Group Therapy   Participation Level:  Did Not Attend    Ambrose Mantle, LCSW 08/12/2013, 12:21 PM

## 2013-08-12 NOTE — Progress Notes (Signed)
Pt has been irritable and isolative tonight. Refused group. Trying to help son get back to his residence after son missed the bus after visitation. "I just don't feel good. I'm stiff. I've got the shakes." Pt supported, medicated per orders. Provided snack. Pt less agitated after interventions. Denies SI/HI/AVH. Wants D/C early in the morning to make his eye appt "across the street" at 1030 (this is previously noted in chart). Told pt this would be up to team to decide/facilitate in the morning. Pt accepting. Cory Foster

## 2013-08-13 DIAGNOSIS — F322 Major depressive disorder, single episode, severe without psychotic features: Secondary | ICD-10-CM

## 2013-08-13 NOTE — Progress Notes (Signed)
Cory Foster [facility] MD Progress Note  08/13/2013 5:06 PM Cory Foster  MRN:  [id number] Subjective:  Not feeling too well. He is feeling down, weak, not sure what is going on. His son was "turned loose" from the ED. He is worried about him. He was relieved when his son had  decided to get help but now he is back to worrying about him. He is missing a dermatology appointment today. He is overall worrying about what is going to happen. He admits that the uncertain creates more anxiety for him. Still states he needs to go to rehab because other wise does not think he is going to make it.  Diagnosis:   DSM5: Schizophrenia Disorders:  none Obsessive-Compulsive Disorders:  none Trauma-Stressor Disorders:  none Substance/Addictive Disorders:  Alcohol Related Disorder - Severe (303.90) Depressive Disorders:  Major Depressive Disorder - Severe (296.23)  Axis I: Generalized Anxiety Disorder  ADL's:  Intact  Sleep: Fair  Appetite:  Fair  Suicidal Ideation:  Plan:  denies Intent:  denies Foster:  denies Homicidal Ideation:  Plan:  denies Intent:  denies Foster:  denies AEB (as evidenced by):  Psychiatric Specialty Exam: Review of Systems  Constitutional: Positive for malaise/fatigue.  HENT: Negative.   Eyes: Negative.   Cardiovascular: Negative.   Gastrointestinal: Negative.   Genitourinary: Negative.   Musculoskeletal: Positive for myalgias.  Skin: Negative.   Neurological: Positive for weakness.  Endo/Heme/Allergies: Negative.   Psychiatric/Behavioral: Positive for depression and substance abuse. The patient is nervous/anxious.     Blood pressure 115/79, pulse 73, temperature 97.3 F (36.3 C), temperature source Oral, resp. rate 16, height 6\' 2"  (1.88 m), weight 115.214 kg (254 lb).Body mass index is 32.6 kg/(m^2).  General Appearance: Fairly Groomed  Patent attorney::  Fair, right eye hyperemic  Speech:  Clear and Coherent, Slow and not spontaneous  Volume:  Decreased  Mood:  Anxious and  Depressed  Affect:  anxious, sad  Thought Process:  Coherent and Goal Directed  Orientation:  Full (Time, Place, and Person)  Thought Content:  symptoms, worries, concerns  Suicidal Thoughts:  No  Homicidal Thoughts:  No  Memory:  Immediate;   Fair Recent;   Fair Remote;   Fair  Judgement:  Fair  Insight:  Present and Shallow  Psychomotor Activity:  Restlessness  Concentration:  Fair  Recall:  Fair  Akathisia:  No  Handed:    AIMS (if indicated):     Assets:  Desire for Improvement  Sleep:  Number of Hours: 6.5   Current Medications: Current Facility-Administered Medications  Medication Dose Route Frequency Provider Last Rate Last Dose  . acetaminophen (TYLENOL) tablet 650 mg  650 mg Oral Q6H PRN Cory Hough, PA-C   650 mg at 08/09/13 1607  . alum & mag hydroxide-simeth (MAALOX/MYLANTA) 200-200-20 MG/5ML suspension 30 mL  30 mL Oral Q4H PRN Cory Hough, PA-C      . gabapentin (NEURONTIN) capsule 300 mg  300 mg Oral TID Cory Fee, MD   300 mg at 08/13/13 1155  . gabapentin (NEURONTIN) capsule 300 mg  300 mg Oral QHS Cory Fee, MD   300 mg at 08/12/13 2224  . hydrOXYzine (ATARAX/VISTARIL) tablet 25 mg  25 mg Oral Q6H PRN Cory Means, NP   25 mg at 08/12/13 0209  . magnesium hydroxide (MILK OF MAGNESIA) suspension 30 mL  30 mL Oral Daily PRN Cory Hough, PA-C      . multivitamin with minerals tablet 1 tablet  1 tablet  Oral Daily Cory Hough, PA-C   1 tablet at 08/13/13 0827  . QUEtiapine (SEROQUEL) tablet 100 mg  100 mg Oral QHS Cory Fee, MD   100 mg at 08/12/13 2223  . ramelteon (ROZEREM) tablet 8 mg  8 mg Oral QHS Cory Mans, NP   8 mg at 08/12/13 2223  . sertraline (ZOLOFT) tablet 100 mg  100 mg Oral BID Cory Fee, MD   100 mg at 08/13/13 0827  . thiamine (B-1) injection 100 mg  100 mg Intramuscular Once [employer], PA-C      . thiamine (VITAMIN B-1) tablet 100 mg  100 mg Oral Daily Cory Hough, PA-C   100 mg at 08/13/13 0827  .  tobramycin (TOBREX) 0.3 % ophthalmic ointment   Right Eye TID Cory Hough, PA-C        Lab Results: No results found for this or any previous visit (from the past 48 hour(s)).  Physical Findings: AIMS: Facial and Oral Movements Muscles of Facial Expression: None, normal Lips and Perioral Area: None, normal Jaw: None, normal Tongue: None, normal,Extremity Movements Upper (arms, wrists, hands, fingers): None, normal Lower (legs, knees, ankles, toes): None, normal, Trunk Movements Neck, shoulders, hips: None, normal, Overall Severity Severity of abnormal movements (highest score from questions above): None, normal Incapacitation due to abnormal movements: None, normal Patient's awareness of abnormal movements (rate only patient's report): No Awareness, Dental Status Current problems with teeth and/or dentures?: No Does patient usually wear dentures?: No  CIWA:  CIWA-Ar Total: 4 COWS:  COWS Total Score: 2  Treatment Plan Summary: Daily contact with patient to assess and evaluate symptoms and progress in treatment Medication management  Plan: Supportive approach/coping skills/relapse prevention           Optimize treatment with psychotropics  Medical Decision Making Problem Points:  Review of psycho-social stressors (1) Data Points:  Review of medication regiment & side effects (2)  I certify that inpatient services furnished can reasonably be expected to improve the patient's condition.   Cory Foster A 08/13/2013, 5:06 PM

## 2013-08-13 NOTE — [facility] Group Notes (Signed)
[facility] LCSW Aftercare Discharge Planning Group Note   08/13/2013 9:49 AM  Participation Quality:  Minimal   Mood/Affect:  Depressed and Irritable  Depression Rating:  6  Anxiety Rating:  8  Thoughts of Suicide:  No Will you contract for safety?   NA  Current AVH:  No  Plan for Discharge/Comments:  Pt reports that he feels horrible--cannot sleep, shaky, dizzy, and weak. Pt hoping for med changes to help him with these symptoms. Pt reports withdrawal symptoms as 8 today. Pt has screening for admission at [facility] on Thurs 11/20. He has appt with Dr. Neldon Labella for physical/medication management on 12/17.   Transportation Means: bus  Supports: son/limited family supports.   Smart, [employer]

## 2013-08-13 NOTE — Progress Notes (Signed)
Pt attended AA group 

## 2013-08-13 NOTE — Progress Notes (Signed)
Patient ID: Cory Foster, male   DOB: 1956/03/24, 57 y.o.   MRN: [id number] He has been in bed most of day except for meals and medications. Stated that" he was tired". He has refused to fill out his self inventory.  He speaks to you in a slow low voice that I have trouble understanding, Has has had poor eye contact, and shows  no emotion.

## 2013-08-13 NOTE — Progress Notes (Signed)
Pt reports he is frustrated that the eye drops do not seem to be helping his eye infection.  He also is worried that he missed a dermatology appt today.  Assured pt that CM would notify office that pt was in the hospital.  MD note affirms that staff was aware that pt missed an appt.  Pt denies SI/HI/AV.  Pt says his withdrawal symptoms are decreasing, but he still feels shaky and anxious.  Pt makes his needs known to staff.  Pt's R eye still red with drainage.  Support and encouragement offered.  Safety maintained with q15 minute checks.

## 2013-08-13 NOTE — [facility] Group Notes (Signed)
[facility] LCSW Group Therapy  08/13/2013 2:35 PM  Type of Therapy:  Group Therapy  Participation Level:  None  Participation Quality:  Drowsy  Affect:  Lethargic  Cognitive:  Lacking  Insight:  None  Engagement in Therapy:  None  Modes of Intervention:  Discussion, Education, Exploration, Socialization and Support  Summary of Progress/Problems: Today's Topic: Overcoming Obstacles. Pt identified obstacles faced currently and processed barriers involved in overcoming these obstacles. Pt identified steps necessary for overcoming these obstacles and explored motivation (internal and external) for facing these difficulties head on. Pt further identified one area of concern in their lives and chose a skill of focus pulled from their "toolbox." Cory Foster was inattentive and disengaged throughout today's group. He immediately fell asleep and did not wake up until after group terminated. At this time, Cory Foster does not appear to be making progress in the group setting. He is still reporting severe withdrawals, dizziness, weakness, and extreme tiredness.    Smart, Nafisah Runions LCSWA 08/13/2013, 2:35 PM

## 2013-08-13 NOTE — Progress Notes (Signed)
Patient ID: Cory Foster, male   DOB: 01/28/1956, 57 y.o.   MRN: [id number] Pt did not attend group. He was asleep in bed. 

## 2013-08-13 NOTE — Tx Team (Signed)
Interdisciplinary Treatment Plan Update (Adult)  Date: 08/13/2013   Time Reviewed: 10:14 AM  Progress in Treatment:  Attending groups: Yes  Participating in groups: Minimally  Taking medication as prescribed: Yes  Tolerating medication: Yes  Family/Significant othe contact made: SPE completed with pt as he did not consent to family contact.  Patient understands diagnosis: Yes, AEB seeking treatment for ETOH detox, SI, and mood stabilization.  Discussing patient identified problems/goals with staff: Yes  Medical problems stabilized or resolved: Yes  Denies suicidal/homicidal ideation: Yes, during group.  Patient has not harmed self or Others: Yes  New problem(s) identified: n/a  Discharge Plan or Barriers: Pt has followup with Dr. Epimenio Foot 12/17 for med management/follow-up and plans to go to [facility] Thursday for screening and possible admit.  Additional comments: Pt was scheduled for d/c today but after assessment by CSW/MD, pt not stable for d/c. High withdrawal symptoms, anxiety, and depression.  Reason for Continuation of Hospitalization: Mood stabilization Medication management  Withdrawals still reported by pt. Estimated length of stay: 1-3 days   For review of initial/current patient goals, please see plan of care.  Attendees:  Patient:    Family:    Physician: Geoffery Lyons MD 08/13/2013 10:14 AM   Nursing: Altamease Oiler RN  08/13/2013 10:14 AM   Clinical Social Worker [employer], LCSWA  08/13/2013 10:14 AM   Other: Jan RN 08/13/2013 10:14 AM   Other: Darden Dates Nurse CM 08/13/2013 10:14 AM   Other:  08/13/2013 10:14 AM   Other:    Scribe for Treatment Team:  [employer] LCSWA 08/13/2013 10:14 AM

## 2013-08-13 NOTE — Progress Notes (Signed)
Patient ID: Cory Foster, male   DOB: [birth year], 57 y.o.   MRN: [id number] PER STATE REGULATIONS 482.30  THIS CHART WAS REVIEWED FOR MEDICAL NECESSITY WITH RESPECT TO THE PATIENT'S ADMISSION/ DURATION OF STAY.  NEXT REVIEW DATE: 08/15/2013  Willa Rough, RN, BSN CASE MANAGER'

## 2013-08-14 ENCOUNTER — Encounter (HOSPITAL_COMMUNITY): Payer: Self-pay | Admitting: Emergency Medicine

## 2013-08-14 MED ORDER — FLUORESCEIN SODIUM 1 MG OP STRP
1.0000 | ORAL_STRIP | Freq: Once | OPHTHALMIC | Status: AC
  Administered 2013-08-14: 1 via OPHTHALMIC

## 2013-08-14 MED ORDER — TETRACAINE HCL 0.5 % OP SOLN
1.0000 [drp] | Freq: Once | OPHTHALMIC | Status: AC
  Administered 2013-08-14: 1 [drp] via OPHTHALMIC
  Filled 2013-08-14: qty 2

## 2013-08-14 NOTE — Progress Notes (Signed)
Patient ID: Cory Foster, male   DOB: 12-May-1956, 57 y.o.   MRN: [id number] He has been in bed today  Except for medications. He continues to c.o rt eye pain. Eye is blood shot and has a yellow crusted drainage. Area around eye is slightly swollen. He has eye drops ordered but eye continues to get worse. Tylenol not effective for the pain. Says he can bear ly  see because he also  has a large cataract on his left eye.  Self inventory: de[ression 5, hopelessness 6 . Indicated he has withdrawal symptoms but has said no when asked if he did. He denies SI thoughts.

## 2013-08-14 NOTE — ED Notes (Signed)
Report given to [facility] at [facility]. Pt escorted by tec. Pt remains cooperative

## 2013-08-14 NOTE — ED Notes (Signed)
Pt sent from [facility] for eye pain and swelling and drainage; sts it's been going on for 2 weeks.

## 2013-08-14 NOTE — Progress Notes (Signed)
[facility] MD Progress Note  08/14/2013 5:44 PM Cory Foster  MRN:  [id number] Subjective:  Stated he was not feeling good. The eye was bothering him. Felt tired, fatigue. He is wanting to pursue rehab once he gets out of here. He is concerned about his right eye as his left has a cataract already. Did not sleep well last night in part because of the eye Diagnosis:   DSM5: Schizophrenia Disorders:  none Obsessive-Compulsive Disorders:  none Trauma-Stressor Disorders:  none Substance/Addictive Disorders:  Alcohol Related Disorder - Severe (303.90) Depressive Disorders:  Major Depressive Disorder - Moderate (296.22)  Axis I: Generalized Anxiety Disorder  ADL's:  Intact  Sleep: Poor  Appetite:  Fair  Suicidal Ideation:  Plan:  denies Intent:  denies Means:  denies Homicidal Ideation:  Plan:  denies Intent:  denies Means:  denies AEB (as evidenced by):  Psychiatric Specialty Exam: Review of Systems  Constitutional: Negative.   HENT: Negative.   Eyes: Positive for pain, discharge and redness.  Respiratory: Negative.   Cardiovascular: Negative.   Gastrointestinal: Negative.   Genitourinary: Negative.   Musculoskeletal: Negative.   Skin: Negative.   Neurological: Negative.   Endo/Heme/Allergies: Negative.   Psychiatric/Behavioral: Positive for depression and substance abuse. The patient is nervous/anxious.     Blood pressure 103/78, pulse 74, temperature 97.4 F (36.3 C), temperature source Oral, resp. rate 20, height 6\' 2"  (1.88 m), weight 115.214 kg (254 lb), SpO2 94.00%.Body mass index is 32.6 kg/(m^2).  General Appearance: Disheveled  Eye Contact::  Minimal  Speech:  Clear and Coherent and Slow  Volume:  Decreased  Mood:  Anxious, Depressed and in pain  Affect:  Restricted and in pain  Thought Process:  Coherent and Goal Directed  Orientation:  Full (Time, Place, and Person)  Thought Content:  symptoms. worries, concerns  Suicidal Thoughts:  No  Homicidal  Thoughts:  No  Memory:  Immediate;   Fair Recent;   Fair Remote;   Fair  Judgement:  Fair  Insight:  Shallow  Psychomotor Activity:  Restlessness  Concentration:  Fair  Recall:  Fair  Akathisia:  No  Handed:    AIMS (if indicated):     Assets:  Desire for Improvement  Sleep:  Number of Hours: 6   Current Medications: Current Facility-Administered Medications  Medication Dose Route Frequency Provider Last Rate Last Dose  . acetaminophen (TYLENOL) tablet 650 mg  650 mg Oral Q6H PRN Kerry Hough, PA-C   650 mg at 08/14/13 4098  . alum & mag hydroxide-simeth (MAALOX/MYLANTA) 200-200-20 MG/5ML suspension 30 mL  30 mL Oral Q4H PRN Kerry Hough, PA-C      . gabapentin (NEURONTIN) capsule 300 mg  300 mg Oral TID Rachael Fee, MD   300 mg at 08/14/13 1712  . gabapentin (NEURONTIN) capsule 300 mg  300 mg Oral QHS Rachael Fee, MD   300 mg at 08/13/13 2129  . hydrOXYzine (ATARAX/VISTARIL) tablet 25 mg  25 mg Oral Q6H PRN Nanine Means, NP   25 mg at 08/12/13 0209  . magnesium hydroxide (MILK OF MAGNESIA) suspension 30 mL  30 mL Oral Daily PRN Kerry Hough, PA-C      . multivitamin with minerals tablet 1 tablet  1 tablet Oral Daily Kerry Hough, PA-C   1 tablet at 08/14/13 0802  . QUEtiapine (SEROQUEL) tablet 100 mg  100 mg Oral QHS Rachael Fee, MD   100 mg at 08/13/13 2129  . ramelteon (ROZEREM) tablet 8  mg  8 mg Oral QHS Kristeen Mans, NP   8 mg at 08/13/13 2129  . sertraline (ZOLOFT) tablet 100 mg  100 mg Oral BID Rachael Fee, MD   100 mg at 08/14/13 1712  . thiamine (B-1) injection 100 mg  100 mg Intramuscular Once [employer], PA-C      . thiamine (VITAMIN B-1) tablet 100 mg  100 mg Oral Daily Kerry Hough, PA-C   100 mg at 08/14/13 0802  . tobramycin (TOBREX) 0.3 % ophthalmic ointment   Right Eye TID Kerry Hough, PA-C        Lab Results: No results found for this or any previous visit (from the past 48 hour(s)).  Physical Findings: AIMS: Facial and Oral  Movements Muscles of Facial Expression: None, normal Lips and Perioral Area: None, normal Jaw: None, normal Tongue: None, normal,Extremity Movements Upper (arms, wrists, hands, fingers): None, normal Lower (legs, knees, ankles, toes): None, normal, Trunk Movements Neck, shoulders, hips: None, normal, Overall Severity Severity of abnormal movements (highest score from questions above): None, normal Incapacitation due to abnormal movements: None, normal Patient's awareness of abnormal movements (rate only patient's report): No Awareness, Dental Status Current problems with teeth and/or dentures?: No Does patient usually wear dentures?: No  CIWA:  CIWA-Ar Total: 2 COWS:  COWS Total Score: 2  Treatment Plan Summary: Daily contact with patient to assess and evaluate symptoms and progress in treatment Medication management  Plan: Supportive approach/coping skills/relapse prevention           Address and reassess co morbidities           Get the eye evaluated/ED            Facilitate admission to rehab  Medical Decision Making Problem Points:  Review of psycho-social stressors (1) Data Points:  Review of medication regiment & side effects (2)  I certify that inpatient services furnished can reasonably be expected to improve the patient's condition.   [name] A 08/14/2013, 5:44 PM

## 2013-08-14 NOTE — Progress Notes (Signed)
Didn't attend AA  

## 2013-08-14 NOTE — Progress Notes (Signed)
Patient ID: Cory Foster, male   DOB: [birth year], 57 y.o.   MRN: [id number]  Patient returned to [facility] from ED. Pt with no s/s of distress noted. Pt states he "feels good". Pt with dx of sty to his right eye. Pt denies SI. Pt cooperative and pleasant with staff.

## 2013-08-14 NOTE — Progress Notes (Signed)
Patient ID: Cory Foster, male   DOB: 03/25/1956, 57 y.o.   MRN: [id number]   Dr. Dub Mikes made me aware today that this patient has been reporting worsening discomfort in his right eye for the past several days. Today the patient states that he has been having this issue for about a month and was seen in the ED and given eye drops. He has used them up and still continues to have symptoms. He states the pain is a new symptom.   He states he is unable to open his Right eye this morning as it is matted together. He states his vision has gotten blurry in this eye as well. He denies any recent URI symptoms, fever, cough or congestion, and reports no allergy symptoms.He states it feels "inflammed." But he denies any foreign body sensation.  He does say it hurts when he is in bright light and is trying to keep the room dark. He reports cataracts in the OS.  BP 100/66  Pulse 83  Temp(Src) 98 F (36.7 C) (Oral)  Resp 20  Ht 6\' 2"  (1.88 m)  Wt 115.214 kg (254 lb)  BMI 32.60 kg/m2  On exam the patient is lying down and appears to be in discomfort. The room is dark and he is shading his eyes.  There is mucous in the eye lashes and the eye is matted shut. There appears to be a sty to the upper eye lid.  Assessment: 1. Eye pain of uncertain etiology. 2. Visual change.  Plan: 1. Patient is to be sent to the ED for evaluation. 2. RN is asked to contact [facility] for transport. 3. Opthalmologist on Call is paged.  Rona Ravens. [place]burn RPAC 11:55 AM 08/14/2013  Agree with assessment and plan Madie Reno A. Dub Mikes, M.D.

## 2013-08-14 NOTE — Progress Notes (Signed)
Pt observed earlier in the dayroom watching TV with peers.  Pt still seems somewhat unsteady on his feet, but denies any withdrawal symptoms at this time.  Pt denies SI/HI/AV.  Pt was assisted to his room and given a warm/wet compress to apply to his R eye at this time.  Pt voices no other needs/concerns.  Pt says he is supposed to discharge Thursday for an appt at [facility].  Pt makes his needs known to staff.  Support and encouragement offered.  Safety maintained with q15 minute checks.

## 2013-08-14 NOTE — ED Provider Notes (Signed)
CSN: [id number]     Arrival date & time 08/14/13  1231 History  This chart was scribed for Cory Ceo, PA-C, working with Cory Foster. Cory Lamas, MD by Cory Foster, ED Scribe. This patient was seen in room WTR6/WTR6 and the patient's care was started at 2:09 PM.    Chief Complaint  Patient presents with  . Eye Pain    Patient is a 57 y.o. male presenting with eye pain. The history is provided by the patient. No language interpreter was used.  Eye Pain Associated symptoms include headaches. Pertinent negatives include no abdominal pain.    HPI Comments: Cory Foster is a 57 y.o. male sent from [facility] who presents to the Emergency Department complaining of constant right eye pain that began two weeks ago. He describes the pain as burning. He has been using eye drops and using warm washclothes without relief (only once or twice daily). The pain is worsened with eye movement and bright lights. He also has been having mild blurry vision, but has blurry vision at baseline.  He was seen on 07/30/13 for the same pain and prescribed the eye drops he has been using (tobramycin). He denies any foreign bodies present in his eye currently or when the pain began.  No eye discharge.  He is also complaining of a left sided headache and a sore throat for a week. He denies fever, cough and rhinorrhea. He denies any history of eye problems in his right eye before this pain began.  He wears glasses but does not wear contacts.  No eye trauma/injury.  No fevers, eye swelling, rhinorrhea, congestion, or neck pain.     Past Medical History  Diagnosis Date  . Anxiety   . Depression   . Varicose vein    Past Surgical History  Procedure Laterality Date  . Vein ligation and stripping    . Cholecystectomy    . Sinusotomy     Family History  Problem Relation Age of Onset  . Depression Mother   . Anxiety disorder Mother   . Cancer Father    History  Substance Use Topics  . Smoking status:  Current Every Day Smoker -- 0.50 packs/day    Types: Cigarettes  . Smokeless tobacco: Never Used  . Alcohol Use: Yes     Comment: daily when out of medicine    Review of Systems  Constitutional: Negative for fever, diaphoresis, activity change, appetite change and fatigue.  HENT: Positive for sore throat. Negative for congestion, mouth sores, rhinorrhea and trouble swallowing.   Eyes: Positive for photophobia, pain, redness and visual disturbance. Negative for discharge and itching.  Respiratory: Negative for cough.   Gastrointestinal: Negative for nausea, vomiting and abdominal pain.  Neurological: Positive for headaches. Negative for dizziness, weakness and light-headedness.  All other systems reviewed and are negative.     Allergies  Review of patient's allergies indicates no known allergies.  Home Medications   Current Outpatient Rx  Name  Route  Sig  Dispense  Refill  . clonazePAM (KLONOPIN) 2 MG tablet   Oral   Take 2 mg by mouth 3 (three) times daily as needed for anxiety.         . sertraline (ZOLOFT) 100 MG tablet   Oral   Take 100 mg by mouth 2 (two) times daily.           Marland Kitchen adalimumab (HUMIRA) 40 MG/0.8ML injection   Subcutaneous   Inject 40 mg into the skin every 14 (  fourteen) days.          Triage Vitals: BP 103/78  Pulse 74  Temp(Src) 97.4 F (36.3 C) (Oral)  Resp 20  Ht 6\' 2"  (1.88 m)  Wt 254 lb (115.214 kg)  BMI 32.60 kg/m2  SpO2 94%  Filed Vitals:   08/14/13 0631 08/14/13 1240 08/15/13 0730 08/15/13 0731  BP: 100/66 103/78 127/89 117/83  Pulse: 83 74 66 76  Temp:  97.4 F (36.3 C) 97.4 F (36.3 C)   TempSrc:  Oral Oral   Resp:  20 16   Height:      Weight:      SpO2:  94%       Physical Exam  Nursing note and vitals reviewed. Constitutional: He is oriented to person, place, and time. He appears well-developed and well-nourished. No distress.  HENT:  Head: Normocephalic and atraumatic.  Right Ear: Tympanic membrane and external  ear normal.  Left Ear: Tympanic membrane and external ear normal.  Nose: Nose normal.  Mouth/Throat: Oropharynx is clear and moist. Mucous membranes are dry. No oropharyngeal exudate.  TM's gray and translucent bilaterally.  No erythema/edema/exudates to the posterior pharynx.  No trismus.  Uvula midline.    Eyes: EOM are normal. Pupils are equal, round, and reactive to light. Right eye exhibits no discharge. Left eye exhibits no discharge.    5 mm circular mass to the middle upper eyelid with an underlying white ulcer to the inner eyelid.  Mass is painful to palpation.  Conjunctiva is injected on the right throughout.  No eye drainage.  No eyelid/peri-orbital edema/erythema.  Photophobia on the right.  No foreign bodies visualized.  No obvious AV nicking/hemorrhage on fundoscopic exam bilaterally.  No chemosis subconjunctival hemorrhage on the right.    Neck: Neck supple. No tracheal deviation present.  Cardiovascular: Normal rate.   Pulmonary/Chest: Effort normal. No respiratory distress.  Musculoskeletal: Normal range of motion.  Neurological: He is alert and oriented to person, place, and time.  Skin: Skin is warm and dry. He is not diaphoretic.  Psychiatric: He has a normal mood and affect. His behavior is normal.    ED Course  Procedures (including critical care time)  DIAGNOSTIC STUDIES: Oxygen Saturation is 94% on room air, adequate by my interpretation.    COORDINATION OF CARE: 2:22 PM -Will order eye drops. Patient verbalizes understanding and agrees with treatment plan.    Labs Review Labs Reviewed  HEMOGLOBIN A1C - Abnormal; Notable for the following:    Hemoglobin A1C 5.8 (*)    Mean Plasma Glucose 120 (*)    All other components within normal limits  BASIC METABOLIC PANEL - Abnormal; Notable for the following:    Glucose, Bld 128 (*)    All other components within normal limits  COMPREHENSIVE METABOLIC PANEL - Abnormal; Notable for the following:    Glucose, Bld  109 (*)    Albumin 3.0 (*)    AST 54 (*)    GFR calc non Af Amer 70 (*)    GFR calc Af Amer 81 (*)    All other components within normal limits  VITAMIN B12  FOLATE   Imaging Review No results found.  EKG Interpretation   None       MDM   Cory Foster is a 57 y.o. male sent from [facility] who presents to the Emergency Department complaining of constant right eye pain, drainage, redness and swelling that began two weeks ago.  Fluorescein stain ordered with tetracaine.  Visual acuity screen ordered.     Rechecks  2:31 PM = AG Visual Acuity - Bilateral Distance: 20/200 (eye glasses used) ; R Distance: 20/200 ; L Distance: 20/200 2:55 PM = Woods lamp reveals small horizontal area 3 mm x 1 mm linear of uptake on the 6:00 position of the eye.  Occular pressures were 4,6, and 7 on the right.      Patient seen in the ED for right eye pain.  Etiology of eye pain possibly due to hordeolum/stye.  Patient has a painful mass to the right upper eyelid.  He was seen and treated treated for the stye on 07/30/13 and given tobramycin drops.  He has been using the drops but has not been compliant with the warm compresses.  He denies any worsening of his condition but states it is not improving.  His woods lamp exam revealed a small area of uptake.  He was instructed to continue to use the Tobramycin drops.  He has no periorbital erythema/edema and is afebrile to suggest cellulitis.  He has a left sided headache but has a right sided eye complaint.  He did not have elevated eye pressures to suggest acute angle closure glaucoma.  No evidence of foreign body or trauma.  Patient was instructed to continue to use warm compresses for symptomatic relief.  Spoke with caregiver in ED and told her he needs to be seen by an ophthalmologist if his symptoms are not improving or worsening.  Patient transferred back to behavioral health.  Return precautions discussed.    Final impressions: 1. Alcohol  dependence   2. GAD (generalized anxiety disorder)   3. MDD (major depressive disorder)   4. Hordeolum externum of right eye   5. Conjunctivitis, right eye      Luiz Iron PA-C   This patient was discussed with Dr. Oletta Foster   I personally performed the services described in this documentation, which was scribed in my presence. The recorded information has been reviewed and is accurate.     Jillyn Ledger, PA-C 08/16/13 [phone]

## 2013-08-14 NOTE — Progress Notes (Signed)
Patient ID: Cory Foster, male   DOB: 04/14/1956, 57 y.o.   MRN: [id number]    He was taken to [facility] for evaluation/treatment of RT. Eye.

## 2013-08-14 NOTE — Progress Notes (Signed)
Recreation Therapy Notes  Date: 11.18.2014 Time: 2:30pm Location: [address] Dayroom   Group Topic: Software engineer Activities (AAA)  Behavioral Response: Did not attend.   Marykay Lex Antonyo Hinderer, LRT/CTRS  Roxie Kreeger L 08/14/2013 5:06 PM

## 2013-08-14 NOTE — Progress Notes (Signed)
Patient ID: Cory Foster, male   DOB: [birth year], 57 y.o.   MRN: [id number] Pt did not attend group. He was in bed.

## 2013-08-15 DIAGNOSIS — F1994 Other psychoactive substance use, unspecified with psychoactive substance-induced mood disorder: Secondary | ICD-10-CM

## 2013-08-15 DIAGNOSIS — F1910 Other psychoactive substance abuse, uncomplicated: Secondary | ICD-10-CM

## 2013-08-15 MED ORDER — GABAPENTIN 300 MG PO CAPS
300.0000 mg | ORAL_CAPSULE | Freq: Four times a day (QID) | ORAL | Status: DC
  Filled 2013-08-15: qty 42

## 2013-08-15 MED ORDER — QUETIAPINE FUMARATE 100 MG PO TABS: 100.0000 mg | ORAL_TABLET | Freq: Every day | ORAL | Status: DC

## 2013-08-15 MED ORDER — GABAPENTIN 300 MG PO CAPS: 300.0000 mg | ORAL_CAPSULE | Freq: Four times a day (QID) | ORAL | Status: DC

## 2013-08-15 MED ORDER — RAMELTEON 8 MG PO TABS: 8.0000 mg | ORAL_TABLET | Freq: Every day | ORAL | Status: DC

## 2013-08-15 MED ORDER — SERTRALINE HCL 100 MG PO TABS: 100.0000 mg | ORAL_TABLET | Freq: Two times a day (BID) | ORAL | Status: DC

## 2013-08-15 NOTE — Progress Notes (Signed)
Patient ID: Cory Foster, male   DOB: Mar 16, 1956, 57 y.o.   MRN: [id number] He has been discharged home and was picked up by his son. He voiced understanding of discharge instruction and of follow up. He denies thoughts of harming self and all belongings were taken home with him.

## 2013-08-15 NOTE — Progress Notes (Signed)
Adult Psychoeducational Group Note  Date:  08/15/2013 Time:  1:12 PM  Group Topic/Focus:  Personal Choices and Values:   The focus of this group is to help patients assess and explore the importance of values in their lives, how their values affect their decisions, how they express their values and what opposes their expression.  Participation Level:  Did Not Attend  Additional Comments:  Pt was encouraged to come to group but pt stayed in bed.  Cathlean Cower 08/15/2013, 1:12 PM

## 2013-08-15 NOTE — [facility] Group Notes (Signed)
[facility] LCSW Aftercare Discharge Planning Group Note   08/15/2013 9:15 AM  Participation Quality:  Minimal   Mood/Affect:  Depressed  Depression Rating:  2  Anxiety Rating:  6  Thoughts of Suicide:  No Will you contract for safety?   NA  Current AVH:  No  Plan for Discharge/Comments:  Pt reports that he wants to d/c today in order to get ready for screening/possible admission tomorrow into [facility]. Pt has appt with Dr. Epimenio Foot for med management (PCP) 12/17. Pt reports that he is not experiencing withdrawals today and slept much better last night.   Transportation Means: pt's son  Supports: pt's son; limited family supports   Counselling psychologist, Oncologist

## 2013-08-15 NOTE — [facility] Group Notes (Signed)
[facility] LCSW Group Therapy  08/15/2013 2:21 PM  Type of Therapy:  Group Therapy  Participation Level:  Minimal  Participation Quality:  Drowsy  Affect:  Depressed  Cognitive:  Lacking  Insight:  Limited  Engagement in Therapy:  Limited  Modes of Intervention:  Discussion, Education, Exploration, Socialization and Support  Summary of Progress/Problems: Emotion Regulation: This group focused on both positive and negative emotion identification and allowed group members to process ways to identify feelings, regulate negative emotions, and find healthy ways to manage internal/external emotions. Group members were asked to reflect on a time when their reaction to an emotion led to a negative outcome and explored how alternative responses using emotion regulation would have benefited them. Group members were also asked to discuss a time when emotion regulation was utilized when a negative emotion was experienced. Cory Foster was attentive but disengaged during today's therapy group. Cory Foster apologized for his lack of active participation, stating that his eye was bothering him. Cory Foster listened as others participated in group discussion. When asked about the emotion he struggles with most, Cory Foster stated that he has a problem with depression and hopelessness.' Cory Foster was able to describe how these emotions differed and describe how they felt to him both physically and emotionally. Cory Foster stated that he has had bad experiences with AA in the past and does not feel that AA would be helpful to him in terms of assisting with emotion regulation and recovery. Cory Foster was asked to explore what might assist him with emotion regualtion-taking my meds, going to [facility], and learning how to stay away from booze." Cory Foster shows limited progress in the group setting and limited insight AEB his inability to explore how these coping skills/ideas can assist him with emotion regulation in the long term.    Cory Foster, Cory Foster  LCSWA 08/15/2013, 2:21 PM

## 2013-08-15 NOTE — [facility] Suicide Risk Assessment (Signed)
Suicide Risk Assessment  Discharge Assessment     Demographic Factors:  Male and Caucasian  Mental Status Per Nursing Assessment::   On Admission:     Current Mental Status by Physician: In full contact with reality. There are no suicidal ideas, plans or intent. He is evidencing no S/S of acute withdrawal. His mood is worried, affect is appropriate. He is willing and motivated to pursue treatment at the [facility] where he is going tomorrow. His son is also working on getting in rehab   Loss Factors: Decline in physical health  Historical Factors: NA  Risk Reduction Factors:   Sense of responsibility to family  Continued Clinical Symptoms:  Depression:   Comorbid alcohol abuse/dependence Alcohol/Substance Abuse/Dependencies  Cognitive Features That Contribute To Risk:  Closed-mindedness Polarized thinking Thought constriction (tunnel vision)    Suicide Risk:  Minimal: No identifiable suicidal ideation.  Patients presenting with no risk factors but with morbid ruminations; may be classified as minimal risk based on the severity of the depressive symptoms  Discharge Diagnoses:   AXIS I:  Alcohol Dependence, S/P alcohol withdrawal, Depressive disorder NOS, GAD AXIS II:  Deferred AXIS III:   Past Medical History  Diagnosis Date  . Anxiety   . Depression   . Varicose vein    AXIS IV:  other psychosocial or environmental problems AXIS V:  51-60 moderate symptoms  Plan Of Care/Follow-up recommendations:  Activity:  as tolerated Diet:  regular Follow up [facility] Is patient on multiple antipsychotic therapies at discharge:  No   Has Patient had three or more failed trials of antipsychotic monotherapy by history:  No  Recommended Plan for Multiple Antipsychotic Therapies: NA  Skie Vitrano A 08/15/2013, 11:29 AM

## 2013-08-15 NOTE — Progress Notes (Signed)
[facility] Adult Case Management Discharge Plan :  Will you be returning to the same living situation after discharge: No. [facility] screening and possible admission on Thursday , 11/20.  At discharge, do you have transportation home?:Yes,  pt's son Do you have the ability to pay for your medications:Yes,  Medicare  Release of information consent forms completed and in the chart;  Patient's signature needed at discharge.  Patient to Follow up at: Follow-up Information   Follow up with [facility] On 08/16/2013. (Appt. for screening at 8AM. Please bring ID, 14 day medication supply, and clothing. If bed available and screening goes well, you will be accepted on this date. )    Contact information:   [address] [place], [place] [address] Phone: [phone] Fax: [phone]      Follow up with Dr. Lucky Cowboy [facility] On 09/12/2013. (Appt. at 11AM with Dr. Barbarann Ehlers for hospital follow-up/medication management/yearly physical.)    Contact information:   [address] [place], [place] [address] Phone: [phone] Fax: [phone]      Patient denies SI/HI:   Yes,  during group/self report.     Safety Planning and Suicide Prevention discussed:  Yes,  PT refused family contact. SPE completed with pt and he was encouraged to share information with his support network, ask questions, and talk about any concerns.   Smart, Senan Urey LCSWA  08/15/2013, 10:37 AM

## 2013-08-16 NOTE — ED Provider Notes (Signed)
Medical screening examination/treatment/procedure(s) were performed by non-physician practitioner and as supervising physician I was immediately available for consultation/collaboration.  Joice Nazario Y. Daymen Hassebrock, MD 08/16/13 1711 

## 2013-08-17 ENCOUNTER — Encounter (HOSPITAL_COMMUNITY): Payer: Self-pay | Admitting: Emergency Medicine

## 2013-08-17 ENCOUNTER — Emergency Department (HOSPITAL_COMMUNITY)
Admission: EM | Admit: 2013-08-17 | Discharge: 2013-08-18 | Disposition: A | Discharge status: Home / Self Care | Payer: Medicare Other | Attending: Emergency Medicine | Admitting: Emergency Medicine

## 2013-08-17 ENCOUNTER — Emergency Department (HOSPITAL_COMMUNITY): Payer: Medicare Other

## 2013-08-17 DIAGNOSIS — F17200 Nicotine dependence, unspecified, uncomplicated: Secondary | ICD-10-CM | POA: Insufficient documentation

## 2013-08-17 DIAGNOSIS — H109 Unspecified conjunctivitis: Secondary | ICD-10-CM | POA: Insufficient documentation

## 2013-08-17 DIAGNOSIS — F411 Generalized anxiety disorder: Secondary | ICD-10-CM | POA: Insufficient documentation

## 2013-08-17 DIAGNOSIS — Z79899 Other long term (current) drug therapy: Secondary | ICD-10-CM | POA: Insufficient documentation

## 2013-08-17 DIAGNOSIS — F1010 Alcohol abuse, uncomplicated: Secondary | ICD-10-CM | POA: Insufficient documentation

## 2013-08-17 DIAGNOSIS — F1910 Other psychoactive substance abuse, uncomplicated: Secondary | ICD-10-CM

## 2013-08-17 DIAGNOSIS — F3289 Other specified depressive episodes: Secondary | ICD-10-CM | POA: Insufficient documentation

## 2013-08-17 DIAGNOSIS — F329 Major depressive disorder, single episode, unspecified: Secondary | ICD-10-CM | POA: Insufficient documentation

## 2013-08-17 DIAGNOSIS — Z8679 Personal history of other diseases of the circulatory system: Secondary | ICD-10-CM | POA: Insufficient documentation

## 2013-08-17 DIAGNOSIS — F419 Anxiety disorder, unspecified: Secondary | ICD-10-CM

## 2013-08-17 LAB — COMPREHENSIVE METABOLIC PANEL
ALT: 25 U/L (ref 0–53)
AST: 42 U/L — ABNORMAL HIGH (ref 0–37)
Albumin: 3.2 g/dL — ABNORMAL LOW (ref 3.5–5.2)
CO2: 26 mEq/L (ref 19–32)
Chloride: 101 mEq/L (ref 96–112)
GFR calc non Af Amer: >90 mL/min (ref >90)
Sodium: 138 mEq/L (ref 135–145)
Total Bilirubin: 0.4 mg/dL (ref 0.3–1.2)

## 2013-08-17 LAB — CBC WITH DIFFERENTIAL/PLATELET
Basophils Absolute: 0.1 10*3/uL (ref 0.0–0.1)
Basophils Relative: 1 % (ref 0–1)
HCT: 39.5 % (ref 39.0–52.0)
Lymphocytes Relative: 26 % (ref 12–46)
Lymphs Abs: 1.7 10*3/uL (ref 0.7–4.0)
MCV: 109.4 fL — ABNORMAL HIGH (ref 78.0–100.0)
Monocytes Absolute: 0.8 10*3/uL (ref 0.1–1.0)
Neutro Abs: 3.9 10*3/uL (ref 1.7–7.7)
Neutrophils Relative %: 59 % (ref 43–77)
Platelets: 157 10*3/uL (ref 150–400)
RDW: 15.4 % (ref 11.5–15.5)
WBC: 6.6 10*3/uL (ref 4.0–10.5)

## 2013-08-17 LAB — ETHANOL: Alcohol, Ethyl (B): <11 mg/dL (ref 0–11)

## 2013-08-17 MED ORDER — SERTRALINE HCL 50 MG PO TABS
100.0000 mg | ORAL_TABLET | Freq: Two times a day (BID) | ORAL | Status: DC
  Administered 2013-08-17 – 2013-08-18 (×2): 100 mg via ORAL
  Filled 2013-08-17 (×2): qty 2

## 2013-08-17 MED ORDER — GABAPENTIN 300 MG PO CAPS
300.0000 mg | ORAL_CAPSULE | Freq: Four times a day (QID) | ORAL | Status: DC
Start: 2013-08-17 — End: 2013-08-18
  Administered 2013-08-17 – 2013-08-18 (×3): 300 mg via ORAL
  Filled 2013-08-17 (×5): qty 1

## 2013-08-17 MED ORDER — RAMELTEON 8 MG PO TABS
8.0000 mg | ORAL_TABLET | Freq: Every day | ORAL | Status: DC
  Administered 2013-08-17: 8 mg via ORAL
  Filled 2013-08-17 (×2): qty 1

## 2013-08-17 MED ORDER — POLYMYXIN B-TRIMETHOPRIM 10000-0.1 UNIT/ML-% OP SOLN
1.0000 [drp] | OPHTHALMIC | Status: DC
  Administered 2013-08-17 – 2013-08-18 (×5): 1 [drp] via OPHTHALMIC
  Filled 2013-08-17: qty 10

## 2013-08-17 MED ORDER — QUETIAPINE FUMARATE 100 MG PO TABS
100.0000 mg | ORAL_TABLET | Freq: Every day | ORAL | Status: DC
  Administered 2013-08-17: 100 mg via ORAL
  Filled 2013-08-17: qty 1

## 2013-08-17 NOTE — ED Notes (Signed)
Patient released from [facility] after being admitted for depression and anxiety.  He reports they changed his medication and he has been dizzy, unsteady on his feet, and unusually sleepy for the past week.  Patient denies any suicidal ideation.  He has also been seen here for an infection in his right eye which has not resolved.

## 2013-08-17 NOTE — Discharge Summary (Signed)
Physician Discharge Summary Note  Patient:  Cory Foster is an 57 y.o., male MRN:  [id number] DOB:  11/21/1955 Patient phone:  [phone] (home)  Patient address:   [address] [place] [address],   Date of Admission:  08/08/2013 Date of Discharge: 08/15/2013  Reason for Admission:  Alcohol detox/dependency, depression  Discharge Diagnoses: Principal Problem:   Alcohol dependence Active Problems:   MDD (major depressive disorder)   GAD (generalized anxiety disorder)  Review of Systems  Constitutional: Negative.   HENT: Negative.   Eyes: Negative.   Respiratory: Negative.   Cardiovascular: Negative.   Gastrointestinal: Negative.   Genitourinary: Negative.   Musculoskeletal: Negative.   Skin: Negative.   Neurological: Negative.   Endo/Heme/Allergies: Negative.   Psychiatric/Behavioral: Positive for substance abuse.    DSM5:  Substance/Addictive Disorders:  Alcohol Related Disorder - Severe (303.90) and Alcohol Intoxication without Use Disorder (F10.929) Depressive Disorders:  Major Depressive Disorder - Severe (296.23)  Axis Diagnosis:   AXIS I:  Alcohol Abuse, Anxiety Disorder NOS, Substance Abuse and Substance Induced Mood Disorder AXIS II:  Deferred AXIS III:   Past Medical History  Diagnosis Date  . Anxiety   . Depression   . Varicose vein    AXIS IV:  other psychosocial or environmental problems, problems related to social environment and problems with primary support group AXIS V:  61-70 mild symptoms  Level of Care:  [facility]  Hospital Course:  On admission:  57 Y/O male who states that he has had issues with his sisters for years since they got power of attorney over his father and they put the house on their name. He was kicked out. He broke in and got some of his things he was arrested for B and E. That happened several times. One of the sisters was also left out. After father died mother came to stay with him as well as the one sister. She  went paranoid got a gun and shot neighbor, police. She was taken downtown so he had to take care of his mother while still working. Son who has Bipolar Disorder went to stay with him to help with his mother Eventually mother died. The sister enter an Insanity plea was in [place] for three years. Moved to [place] with husband. He got sick and she took care of him eventually he died.She had been getting sick in and out of [place], [place]. Lat 6-8 months have been bad for him with all the stress. He is on disability because of his discs. .Since May drinking every day, 6 to 10 beers Cory Foster). He is also using more Klonopin. States that he is prescribed 2 mg TID and he has taken 21/2 TID running out of it.  During hospitalization:  [facility] alcohol detox protocol implemented successfully.  Medications managed--His insulin and Zoloft 100 mg BID for depression.  His Klonopin was not continued.  Gabapentin 300 mg TID for neuropathic pain, Seroquel 100 mg at bedtime for sleep and mood stability, and Rozerem 8 mg for sleep issues.  Cory Foster attended and participated in some therapy sessions.  He denied suicidal/homicidal ideations and auditory/visual hallucinations, follow-up appointments encouraged to attend, outside support groups encouraged and information given, Rx given and two weeks supply given for [facility] to continue his sobriety.  Cory Foster is mentally and physically stable.  Consults:  None  Significant Diagnostic Studies:  labs: completed, reviewed, stable  Discharge Vitals:   Blood pressure 117/83, pulse 76, temperature 97.4 F (36.3 C), temperature  source Oral, resp. rate 16, height 6\' 2"  (1.88 m), weight 115.214 kg (254 lb), SpO2 94.00%. Body mass index is 32.6 kg/(m^2). Lab Results:   No results found for this or any previous visit (from the past 72 hour(s)).  Physical Findings: AIMS: Facial and Oral Movements Muscles of Facial Expression: None, normal Lips and Perioral Area: None,  normal Jaw: None, normal Tongue: None, normal,Extremity Movements Upper (arms, wrists, hands, fingers): None, normal Lower (legs, knees, ankles, toes): None, normal, Trunk Movements Neck, shoulders, hips: None, normal, Overall Severity Severity of abnormal movements (highest score from questions above): None, normal Incapacitation due to abnormal movements: None, normal Patient's awareness of abnormal movements (rate only patient's report): No Awareness, Dental Status Current problems with teeth and/or dentures?: No Does patient usually wear dentures?: No  CIWA:  CIWA-Ar Total: 0 COWS:  COWS Total Score: 2  Psychiatric Specialty Exam: See Psychiatric Specialty Exam and Suicide Risk Assessment completed by Attending Physician prior to discharge.  Discharge destination:  [facility]  Is patient on multiple antipsychotic therapies at discharge:  No   Has Patient had three or more failed trials of antipsychotic monotherapy by history:  No  Recommended Plan for Multiple Antipsychotic Therapies: NA  Discharge Orders   Future Appointments Provider Department Dept Phone   09/12/2013 11:00 AM Cory Cowboy, MD [facility] [phone]   Future Orders Complete By Expires   Diet - low sodium heart healthy  As directed    Discharge instructions  As directed    Comments:     Continue to take your medications, keep your appointments be at [facility] in the morning Continue to work your relapse prevention plan   Increase activity slowly  As directed        Medication List    STOP taking these medications       clonazePAM 2 MG tablet  Commonly known as:  KLONOPIN     HUMIRA 40 MG/0.8ML injection  Generic drug:  adalimumab      TAKE these medications     Indication   gabapentin 300 MG capsule  Commonly known as:  NEURONTIN  Take 1 capsule (300 mg total) by mouth 4 (four) times daily. For anxiety/discomfort    Indication:  anxiety/discomfort     QUEtiapine 100 MG tablet  Commonly known as:  SEROQUEL  Take 1 tablet (100 mg total) by mouth at bedtime. For thoughts   Indication:  Trouble Sleeping, thoughts     ramelteon 8 MG tablet  Commonly known as:  ROZEREM  Take 1 tablet (8 mg total) by mouth at bedtime. For sleep   Indication:  Trouble Sleeping     sertraline 100 MG tablet  Commonly known as:  ZOLOFT  Take 1 tablet (100 mg total) by mouth 2 (two) times daily. For depression/anxiety   Indication:  depression/anxiety           Follow-up Information   Follow up with [facility] On 08/16/2013. (Appt. for screening at 8AM. Please bring ID, 14 day medication supply, and clothing. If bed available and screening goes well, you will be accepted on this date. )    Contact information:   [address] [place], [place] [address] Phone: [phone] Fax: [phone]      Follow up with Dr. Lucky Foster [facility] On 09/12/2013. (Appt. at 11AM with Dr. Barbarann Ehlers for hospital follow-up/medication management/yearly physical.)    Contact information:   [address] [place], [place] [address]  Phone: [phone] Fax: [phone]      Follow-up recommendations:  Activity:  as tolerated Diet:  low-sodium heart healthy diet Continue to work your relapse prevention plan Comments:  Patient will continue his care at [facility].  Total Discharge Time:  Greater than 30 minutes.  SignedNanine Means, PMH-NP 08/17/2013, 8:55 AM Agree with assessment and plan Reymundo Poll. Dub Mikes, M.D.

## 2013-08-17 NOTE — ED Notes (Signed)
Patient transported to X-ray 

## 2013-08-17 NOTE — ED Notes (Signed)
Visual acuity screening completed , pt.'s right eye  Reads at 20/200, left at 20/400.

## 2013-08-17 NOTE — ED Provider Notes (Signed)
CSN: [id number]     Arrival date & time 08/17/13  1738 History   First MD Initiated Contact with Patient 08/17/13 1824     Chief Complaint  Patient presents with  . Dizziness   (Consider location/radiation/quality/duration/timing/severity/associated sxs/prior Treatment) HPI Comments: Patient presents to the emergency department with chief complaint of dizziness. States that he was recently discharged from [facility] yesterday after being admitted for depression and anxiety. He states that he still feels very anxious and depressed. Patient states that he recently started some new medications. He has not noticed any benefit yet.  He denies any SI/HI. He also complains of right eye irritation. He states that he has been fighting eye infections for the past several weeks. He is tried using eyedrops, but the infection has spread from his left eye to his right eye.  The history is provided by the patient. No language interpreter was used.    Past Medical History  Diagnosis Date  . Anxiety   . Depression   . Varicose vein    Past Surgical History  Procedure Laterality Date  . Vein ligation and stripping    . Cholecystectomy    . Sinusotomy     Family History  Problem Relation Age of Onset  . Depression Mother   . Anxiety disorder Mother   . Cancer Father    History  Substance Use Topics  . Smoking status: Current Every Day Smoker -- 0.50 packs/day    Types: Cigarettes  . Smokeless tobacco: Never Used  . Alcohol Use: Yes     Comment: daily when out of medicine    Review of Systems  All other systems reviewed and are negative.    Allergies  Review of patient's allergies indicates no known allergies.  Home Medications   Current Outpatient Rx  Name  Route  Sig  Dispense  Refill  . gabapentin (NEURONTIN) 300 MG capsule   Oral   Take 1 capsule (300 mg total) by mouth 4 (four) times daily. For anxiety/discomfort   120 capsule   0   . QUEtiapine (SEROQUEL) 100 MG  tablet   Oral   Take 1 tablet (100 mg total) by mouth at bedtime. For thoughts   30 tablet   0   . ramelteon (ROZEREM) 8 MG tablet   Oral   Take 1 tablet (8 mg total) by mouth at bedtime. For sleep   30 tablet   0   . sertraline (ZOLOFT) 100 MG tablet   Oral   Take 1 tablet (100 mg total) by mouth 2 (two) times daily. For depression/anxiety   60 tablet   0    BP 122/87  Pulse 68  Temp(Src) 98 F (36.7 C) (Oral)  Resp 16  Wt 240 lb (108.863 kg)  SpO2 95% Physical Exam  Nursing note and vitals reviewed. Constitutional: He is oriented to person, place, and time. He appears well-developed and well-nourished.  HENT:  Head: Normocephalic and atraumatic.  Right Ear: External ear normal.  Left Ear: External ear normal.  Nose: Nose normal.  Mouth/Throat: Oropharynx is clear and moist. No oropharyngeal exudate.  Eyes: Conjunctivae and EOM are normal. Pupils are equal, round, and reactive to light. Right eye exhibits no discharge. Left eye exhibits no discharge. No scleral icterus.  Right eye conjunctiva is injected, moderate discharge, visual acuity is unchanged from prior exam.  Neck: Normal range of motion. Neck supple. No JVD present.  Cardiovascular: Normal rate, regular rhythm, normal heart sounds and intact  distal pulses.  Exam reveals no gallop and no friction rub.   No murmur heard. Pulmonary/Chest: Effort normal and breath sounds normal. No respiratory distress. He has no wheezes. He has no rales. He exhibits no tenderness.  Abdominal: Soft. Bowel sounds are normal. He exhibits no distension and no mass. There is no tenderness. There is no rebound and no guarding.  Musculoskeletal: Normal range of motion. He exhibits no edema and no tenderness.  Neurological: He is alert and oriented to person, place, and time. He has normal reflexes.  CN 3-12 intact  Skin: Skin is warm and dry.  Psychiatric: He has a normal mood and affect. His behavior is normal. Judgment and thought  content normal.    ED Course  Procedures (including critical care time) Labs Review Labs Reviewed - No data to display Imaging Review No results found.  EKG Interpretation   None       MDM  No diagnosis found.   Patient with depression and anxiety.  Medically clear.  Has conjunctivitis, for which I have given him polytrim.  Patient will be seen by TTS, who recommends psychiatry consult.  Psych will see the patient in the morning.  Discussed the patient with the oncoming night doc, Dr. Norlene Campbell, who will monitor until patient is seen in the morning.   Roxy Horseman, PA-C 08/18/13 0021

## 2013-08-17 NOTE — BH Assessment (Signed)
Received call for tele-assessment. Spoke with Roxy Horseman, PA-C who says Pt was recently discharged from [facility] and still feels very anxious and depressed. Tele-assessment will be initiated.  Harlin Rain Ria Comment, [facility] Triage Specialist

## 2013-08-18 DIAGNOSIS — F411 Generalized anxiety disorder: Secondary | ICD-10-CM

## 2013-08-18 DIAGNOSIS — F1994 Other psychoactive substance use, unspecified with psychoactive substance-induced mood disorder: Secondary | ICD-10-CM

## 2013-08-18 DIAGNOSIS — F1910 Other psychoactive substance abuse, uncomplicated: Secondary | ICD-10-CM

## 2013-08-18 NOTE — ED Notes (Signed)
Discharge instructions reviewed.

## 2013-08-18 NOTE — ED Notes (Signed)
Pt.'s personal belongings in a bag; 1 green jacket, 1 checkered  Blue/black pants, 1 gray shirt , 1 pair of slippers , kept in [place] [address].

## 2013-08-18 NOTE — Consult Note (Signed)
[facility] Face-to-Face Psychiatry Consult   Reason for Consult:  Medication management Referring Physician:  EDP  Cory Foster is an 57 y.o. male.  Assessment: AXIS I:  Anxiety Disorder NOS, Substance Abuse and Substance Induced Mood Disorder AXIS II:  Deferred AXIS III:   Past Medical History  Diagnosis Date  . Anxiety   . Depression   . Varicose vein    AXIS IV:  other psychosocial or environmental problems AXIS V:  51-60 moderate symptoms  Plan:  No evidence of imminent risk to self or others at present.   Patient does not meet criteria for psychiatric inpatient admission. Supportive therapy provided about ongoing stressors. Discussed crisis plan, support from social network, calling 911, coming to the Emergency Department, and calling Suicide Hotline.  Subjective:   Cory Foster is a 57 y.o. male patient admitted with dizziness and unable to sleep.  HPI:  Patient seen chart reviewed.  Patient is a 57 year old Caucasian single male who presented to the emergency room requesting adjustment on his medication.  Patient was recently discharged from behavioral center from November 12 - November 19 diagnoses of realizing that her disorder and alcohol abuse.  Patient told that he is taking a lot of medication but is not helping him.  He has poor sleep anxiety and nervousness and dizziness.  He is taking Seroquel, Neurontin, Prozac, Zoloft his Klonopin was discontinued.  She denies any drinking since his release from the hospital.  He feels that the Seroquel is causing side effects.  Since the start of Seroquel he has difficulty falling asleep.  He is more anxious and nervous.  Denies any suicidal thoughts or homicidal thoughts he denies any hallucination or any paranoia.  He is requesting to adjust his medication.  Last night he did not took the Seroquel and this morning he is feeling much better.  He had a good night sleep.  He has no dizziness, racing thoughts, anxiety spells.  Seroquel  was added during his last hospitalization.  He has never taken this medication before.  He is comfortable taking the Zoloft and Neurontin.  He lives with his son.  Patient has appointment with [facility] recovery [facility].  Patient seems much improved since he is not taking seroquel.  He is alert and oriented.  His thought process is clear and coherent.  He wants to be discharged. HPI Elements:   Location:  Emergency room. Quality:  fair. Severity:  mild.  Past Psychiatric History: Past Medical History  Diagnosis Date  . Anxiety   . Depression   . Varicose vein     reports that he has been smoking Cigarettes.  He has been smoking about 0.50 packs per day. He has never used smokeless tobacco. He reports that he drinks alcohol. He reports that he does not use illicit drugs. Family History  Problem Relation Age of Onset  . Depression Mother   . Anxiety disorder Mother   . Cancer Father    Family History Substance Abuse: Yes, Describe: Family Supports: Yes, List: (Son) Living Arrangements: Other (Comment) (Son) Can pt return to current living arrangement?: Yes Abuse/Neglect [facility]) Physical Abuse: Denies Verbal Abuse: Denies Sexual Abuse: Denies Allergies:  No Known Allergies  ACT Assessment Complete:  Yes:    Educational Status    Risk to Self: Risk to self Suicidal Ideation: No Suicidal Intent: No Is patient at risk for suicide?: No Suicidal Plan?: No Access to Means: No What has been your use of drugs/alcohol within the last  12 months?: Pt was abusing alcohol. Last drank 9 days ago. Previous Attempts/Gestures: No How many times?: 0 Other Self Harm Risks: None Triggers for Past Attempts: None known Intentional Self Injurious Behavior: None Family Suicide History: No Recent stressful life event(s): Conflict (Comment);Financial Problems Persecutory voices/beliefs?: No Depression: Yes Depression Symptoms: Despondent;Fatigue;Loss of interest in usual pleasures;Feeling  worthless/self pity;Feeling angry/irritable Substance abuse history and/or treatment for substance abuse?: Yes Suicide prevention information given to non-admitted patients: Not applicable  Risk to Others: Risk to Others Homicidal Ideation: No Thoughts of Harm to Others: No Comment - Thoughts of Harm to Others: None Current Homicidal Intent: No Current Homicidal Plan: No Access to Homicidal Means: No Identified Victim: None History of harm to others?: No Assessment of Violence: None Noted Violent Behavior Description: None Does patient have access to weapons?: No Criminal Charges Pending?: No Does patient have a court date: No  Abuse: Abuse/Neglect Assessment (Assessment to be complete while patient is alone) Physical Abuse: Denies Verbal Abuse: Denies Sexual Abuse: Denies Exploitation of patient/patient's resources: Denies Self-Neglect: Denies  Prior Inpatient Therapy: Prior Inpatient Therapy Prior Inpatient Therapy: Yes Prior Therapy Dates: 08/08/13-08/15/13; 2004 Prior Therapy Facilty/Provider(s): [facility] Reason for Treatment: Alcohol dependence, depression  Prior Outpatient Therapy: Prior Outpatient Therapy Prior Outpatient Therapy: Yes Prior Therapy Dates: 2014 Prior Therapy Facilty/Provider(s): [facility] [place], [place] Reason for Treatment: Anxiety and depression  Additional Information: Additional Information 1:1 In Past 12 Months?: No CIRT Risk: No Elopement Risk: No Does patient have medical clearance?: Yes                  Objective: Blood pressure 118/84, pulse 66, temperature 98.3 F (36.8 C), temperature source Oral, resp. rate 17, weight 240 lb (108.863 kg), SpO2 94.00%.Body mass index is 30.8 kg/(m^2). Results for orders placed during the hospital encounter of 08/17/13 (from the past 72 hour(s))  CBC WITH DIFFERENTIAL     Status: Abnormal   Collection Time    08/17/13  7:10 PM      Result Value Range   WBC 6.6  4.0 - 10.5 K/uL    RBC 3.61 (*) 4.22 - 5.81 MIL/uL   Hemoglobin 13.7  13.0 - 17.0 g/dL   HCT 95.2  84.1 - 32.4 %   MCV 109.4 (*) 78.0 - 100.0 fL   MCH 38.0 (*) 26.0 - 34.0 pg   MCHC 34.7  30.0 - 36.0 g/dL   RDW 40.1  02.7 - 25.3 %   Platelets 157  150 - 400 K/uL   Neutrophils Relative % 59  43 - 77 %   Neutro Abs 3.9  1.7 - 7.7 K/uL   Lymphocytes Relative 26  12 - 46 %   Lymphs Abs 1.7  0.7 - 4.0 K/uL   Monocytes Relative 12  3 - 12 %   Monocytes Absolute 0.8  0.1 - 1.0 K/uL   Eosinophils Relative 3  0 - 5 %   Eosinophils Absolute 0.2  0.0 - 0.7 K/uL   Basophils Relative 1  0 - 1 %   Basophils Absolute 0.1  0.0 - 0.1 K/uL  COMPREHENSIVE METABOLIC PANEL     Status: Abnormal   Collection Time    08/17/13  7:10 PM      Result Value Range   Sodium 138  135 - 145 mEq/L   Potassium 3.7  3.5 - 5.1 mEq/L   Chloride 101  96 - 112 mEq/L   CO2 26  19 - 32 mEq/L  Glucose, Bld 120 (*) 70 - 99 mg/dL   BUN 9  6 - 23 mg/dL   Creatinine, Ser 1.61  0.50 - 1.35 mg/dL   Calcium 9.4  8.4 - 09.6 mg/dL   Total Protein 7.7  6.0 - 8.3 g/dL   Albumin 3.2 (*) 3.5 - 5.2 g/dL   AST 42 (*) 0 - 37 U/L   ALT 25  0 - 53 U/L   Alkaline Phosphatase 69  39 - 117 U/L   Total Bilirubin 0.4  0.3 - 1.2 mg/dL   GFR calc non Af Amer >90  >90 mL/min   GFR calc Af Amer >90  >90 mL/min   Comment: (NOTE)     The eGFR has been calculated using the CKD EPI equation.     This calculation has not been validated in all clinical situations.     eGFR's persistently <90 mL/min signify possible Chronic Kidney     Disease.  ETHANOL     Status: None   Collection Time    08/17/13  7:10 PM      Result Value Range   Alcohol, Ethyl (B) <11  0 - 11 mg/dL   Comment:            LOWEST DETECTABLE LIMIT FOR     SERUM ALCOHOL IS 11 mg/dL     FOR MEDICAL PURPOSES ONLY  LIPASE, BLOOD     Status: None   Collection Time    08/17/13  7:10 PM      Result Value Range   Lipase 28  11 - 59 U/L   Labs are reviewed.  Current Facility-Administered  Medications  Medication Dose Route Frequency Provider Last Rate Last Dose  . gabapentin (NEURONTIN) capsule 300 mg  300 mg Oral QID Roxy Horseman, PA-C   300 mg at 08/18/13 1009  . QUEtiapine (SEROQUEL) tablet 100 mg  100 mg Oral QHS Roxy Horseman, PA-C   100 mg at 08/17/13 2221  . ramelteon (ROZEREM) tablet 8 mg  8 mg Oral QHS Roxy Horseman, PA-C   8 mg at 08/17/13 2221  . sertraline (ZOLOFT) tablet 100 mg  100 mg Oral BID Roxy Horseman, PA-C   100 mg at 08/18/13 1012  . trimethoprim-polymyxin b (POLYTRIM) ophthalmic solution 1 drop  1 drop Both Eyes Q4H Roxy Horseman, PA-C   1 drop at 08/18/13 0454   Current Outpatient Prescriptions  Medication Sig Dispense Refill  . gabapentin (NEURONTIN) 300 MG capsule Take 1 capsule (300 mg total) by mouth 4 (four) times daily. For anxiety/discomfort  120 capsule  0  . QUEtiapine (SEROQUEL) 100 MG tablet Take 1 tablet (100 mg total) by mouth at bedtime. For thoughts  30 tablet  0  . ramelteon (ROZEREM) 8 MG tablet Take 1 tablet (8 mg total) by mouth at bedtime. For sleep  30 tablet  0  . sertraline (ZOLOFT) 100 MG tablet Take 1 tablet (100 mg total) by mouth 2 (two) times daily. For depression/anxiety  60 tablet  0    Psychiatric Specialty Exam:     Blood pressure 118/84, pulse 66, temperature 98.3 F (36.8 C), temperature source Oral, resp. rate 17, weight 240 lb (108.863 kg), SpO2 94.00%.Body mass index is 30.8 kg/(m^2).  General Appearance: Disheveled  Eye Solicitor::  Fair  Speech:  Slow  Volume:  Decreased  Mood:  Anxious  Affect:  Appropriate  Thought Process:  Intact, Linear and Logical  Orientation:  Other:  Alert and oriented x3  Thought Content:  Normal  Suicidal Thoughts:  No  Homicidal Thoughts:  No  Memory:  Alert and oriented x3  Judgement:  Good  Insight:  Present  Psychomotor Activity:  Decreased  Concentration:  Fair  Recall:  Good  Akathisia:  No  Handed:  Right  AIMS (if indicated):     Assets:  Communication  Skills Desire for Improvement Housing Physical Health Social Support  Sleep:      Treatment Plan Summary: Medication management Patient is doing much better since he is not taking Seroquel.  He is less anxious and less depressed.  He denies any hallucinations, paranoia or any suicidal thoughts or homicidal thoughts.  He liked to be discharged.  He has an appointment with [facility].  Recommended to discontinue Seroquel.  She does not have any psychosis and does not need any antipsychotic medication at this time.  Continue Zoloft, Neurontin and Rozerem at present dose. Waino Mounsey T. 08/18/2013 10:29 AM

## 2013-08-18 NOTE — ED Provider Notes (Signed)
Patient not seen by me and I was not directly involved in the patient care. Behavioral Health/Psychiatry has evaluated the patient and, as per their note and recommendation, patient was to be discharged. Follow-up recommendations and medications determined by/prescribed by Behavioral Health/Psychiatry.   Gilda Crease, MD 08/18/13 1139

## 2013-08-18 NOTE — BH Assessment (Signed)
Assessment completed. Consulted with Alberteen Sam, NP who agrees Pt does not meet criteria for inpatient psychiatric treatment. Alberteen Sam recommends Pt be observed in the ED and then evaluated by psychiatry for possible medication adjustment and further evaluation. Relayed recommendation to Roxy Horseman, PA-C and Trisha Mangle, RN.  Harlin Rain Ria Comment, [facility] Triage Specialist

## 2013-08-18 NOTE — Progress Notes (Signed)
CSW met with pt to discuss transportation and housing needs.  Pt states that his only family member is his son and that his son cannot pick him up. Pt states he has been living with son; however, son is staying with his mother this weekend. Pt states he cannot stay with son's mother.   Pt requested homelessness resources. CSW provided list of homelessness resources, and pt and CSW called [facility]. [facility] had available male beds at time of call. CSW provided bus pass to pt.   Pt movement and speech slow, affect flat. Pt cooperative. Pt thanked CSW for bus pass.  York Spaniel [place], [phone]     ED CSW  3:00pm

## 2013-08-18 NOTE — BH Assessment (Signed)
Tele Assessment Note   Cory Foster is an 57 y.o. male, single, Caucasian who presents to [facility] requesting treatment for symptoms of depression. Pt was at [facility] from 08/08/13-08/15/13 and diagnosed with generalized anxiety disorder. Pt was also abusing alcohol. Pt states he has been compliant with his medications but he believes they are making him feel worse, not better. Pt states he feels "very depressed" with symptoms including excessive sleep, staying in bed, feeling shaky and unsteady, poor concentration and feeling hopeless and worthless. He reports feeling anxious. Pt states he feels he was discharged from [facility] too soon. He denies suicidal ideation or any history of suicide attempts or intentional self-harm. He denies homicidal ideation or any history of violence. He denies any current psychotic symptoms, although he says in the past he has hallucinated "big rats." He denies relapsing on alcohol or using any substances.  Pt identifies his sister being hospitalized for mental health treatment and family conflicts as his primary stressor. He reports his follow up appointment with [facility] is tomorrow. Pt lives with his son who has a history of bipolar disorder and substance abuse. Pt is on disability.  Pt appears disheveled, with fair eye contact, soft speech and slow motor behavior. He is drowsy but oriented x4. His thought process is coherent and relevant. His mood is anxious and depressed and affect is depressed. His insight is fair and judgment is good. He was calm and cooperative throughout assessment. When asked what kind of help he felt he needed he said he feels he needs his medications changed.  Axis I: Generalized Anxiety Disorder and Alcohol Abuse Axis II: Deferred Axis III:  Past Medical History  Diagnosis Date  . Anxiety   . Depression   . Varicose vein    Axis IV: problems with primary support group Axis V: GAF=45  Past Medical History:  Past  Medical History  Diagnosis Date  . Anxiety   . Depression   . Varicose vein     Past Surgical History  Procedure Laterality Date  . Vein ligation and stripping    . Cholecystectomy    . Sinusotomy      Family History:  Family History  Problem Relation Age of Onset  . Depression Mother   . Anxiety disorder Mother   . Cancer Father     Social History:  reports that he has been smoking Cigarettes.  He has been smoking about 0.50 packs per day. He has never used smokeless tobacco. He reports that he drinks alcohol. He reports that he does not use illicit drugs.  Additional Social History:  Alcohol / Drug Use Pain Medications: Denies abuse Prescriptions: Denies abuse Over the Counter: Denies abuse History of alcohol / drug use?: Yes Longest period of sobriety (when/how long): Unknown Negative Consequences of Use: Financial;Personal relationships Substance #1 Name of Substance 1: ETOH 1 - Age of First Use: 17 1 - Amount (size/oz): 8-12 beers (12oz) 1 - Frequency: daily 1 - Duration: years 1 - Last Use / Amount: 9 days ago  CIWA: CIWA-Ar BP: 118/66 mmHg Pulse Rate: 63 COWS:    Allergies: No Known Allergies  Home Medications:  (Not in a hospital admission)  OB/GYN Status:  No LMP for male patient.  General Assessment Data Location of Assessment: [facility] ED Is this a Tele or Face-to-Face Assessment?: Tele Assessment Is this an Initial Assessment or a Re-assessment for this encounter?: Initial Assessment Living Arrangements: Other (Comment) (Son) Can pt return to current  living arrangement?: Yes Admission Status: Voluntary Is patient capable of signing voluntary admission?: Yes Transfer from: Home Referral Source: Self/Family/Friend     [facility] Crisis Care Plan Living Arrangements: Other (Comment) (Son) Name of Psychiatrist: [facility] Name of Therapist: [facility]  Education Status Is patient currently in school?: No Current Grade:  NA Highest grade of school patient has completed: 2 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  Name of school: NA Contact person: NA  Risk to self Suicidal Ideation: No Suicidal Intent: No Is patient at risk for suicide?: No Suicidal Plan?: No Access to Means: No What has been your use of drugs/alcohol within the last 12 months?: Pt was abusing alcohol. Last drank 9 days ago. Previous Attempts/Gestures: No How many times?: 0 Other Self Harm Risks: None Triggers for Past Attempts: None known Intentional Self Injurious Behavior: None Family Suicide History: No Recent stressful life event(s): Conflict (Comment);Financial Problems Persecutory voices/beliefs?: No Depression: Yes Depression Symptoms: Despondent;Fatigue;Loss of interest in usual pleasures;Feeling worthless/self pity;Feeling angry/irritable Substance abuse history and/or treatment for substance abuse?: Yes Suicide prevention information given to non-admitted patients: Not applicable  Risk to Others Homicidal Ideation: No Thoughts of Harm to Others: No Comment - Thoughts of Harm to Others: None Current Homicidal Intent: No Current Homicidal Plan: No Access to Homicidal Means: No Identified Victim: None History of harm to others?: No Assessment of Violence: None Noted Violent Behavior Description: None Does patient have access to weapons?: No Criminal Charges Pending?: No Does patient have a court date: No  Psychosis Hallucinations: None noted Delusions: None noted  Mental Status Report Appear/Hygiene: Disheveled Eye Contact: Fair Motor Activity: Freedom of movement Speech: Logical/coherent;Soft Level of Consciousness: Alert;Drowsy Mood: Depressed Affect: Depressed Anxiety Level: None Thought Processes: Coherent;Relevant Judgement: Unimpaired Orientation: Person;Place;Time;Situation;Appropriate for developmental age Obsessive Compulsive Thoughts/Behaviors: None  Cognitive Functioning Concentration:  Decreased Memory: Recent Intact;Remote Intact IQ: Average Insight: Fair Impulse Control: Good Appetite: Fair Weight Loss: 0 Weight Gain: 0 Sleep: Increased Total Hours of Sleep: 15 Vegetative Symptoms: Staying in bed;Decreased grooming  ADLScreening [facility] Assessment Services) Patient's cognitive ability adequate to safely complete daily activities?: Yes Patient able to express need for assistance with ADLs?: Yes Independently performs ADLs?: Yes (appropriate for developmental age)  Prior Inpatient Therapy Prior Inpatient Therapy: Yes Prior Therapy Dates: 08/08/13-08/15/13; 2004 Prior Therapy Facilty/Provider(s): [facility] Reason for Treatment: Alcohol dependence, depression  Prior Outpatient Therapy Prior Outpatient Therapy: Yes Prior Therapy Dates: 2014 Prior Therapy Facilty/Provider(s): [facility] [place], [place] Reason for Treatment: Anxiety and depression  ADL Screening (condition at time of admission) Patient's cognitive ability adequate to safely complete daily activities?: Yes Is the patient deaf or have difficulty hearing?: No Does the patient have difficulty seeing, even when wearing glasses/contacts?: No Does the patient have difficulty concentrating, remembering, or making decisions?: No Patient able to express need for assistance with ADLs?: Yes Does the patient have difficulty dressing or bathing?: No Independently performs ADLs?: Yes (appropriate for developmental age)       Abuse/Neglect Assessment (Assessment to be complete while patient is alone) Physical Abuse: Denies Verbal Abuse: Denies Sexual Abuse: Denies Exploitation of patient/patient's resources: Denies Self-Neglect: Denies Values / Beliefs Cultural Requests During Hospitalization: None Spiritual Requests During Hospitalization: None   Advance Directives (For Healthcare) Advance Directive: Patient does not have advance directive;Patient would not like information Pre-existing out of  facility DNR order (yellow form or pink MOST form): No Nutrition Screen- [facility] Adult/[facility] Patient's home diet: Regular  Additional Information 1:1 In Past 12 Months?:  No CIRT Risk: No Elopement Risk: No Does patient have medical clearance?: Yes     Disposition:  Disposition Initial Assessment Completed for this Encounter: Yes Disposition of Patient: Other dispositions Other disposition(s): Other (Comment) (Observe in ED with psychiatry to evaluate in the AM.)  Consulted with Alberteen Sam, NP who agrees Pt does not meet criteria for inpatient psychiatric treatment. Alberteen Sam recommends Pt be observed in the ED and then evaluated by psychiatry for possible medication adjustment and further evaluation. Relayed recommendation to Roxy Horseman, PA-C and Trisha Mangle, RN.  Pamalee Leyden, [facility], [facility] Triage Specialist    Patsy Baltimore, Harlin Rain 08/18/2013 12:30 AM

## 2013-08-19 NOTE — ED Provider Notes (Signed)
Medical screening examination/treatment/procedure(s) were performed by non-physician practitioner and as supervising physician I was immediately available for consultation/collaboration.  Almyra Birman L Kitiara Hintze, MD 08/19/13 0731 

## 2013-08-20 ENCOUNTER — Encounter (HOSPITAL_COMMUNITY): Payer: Self-pay | Admitting: Emergency Medicine

## 2013-08-20 ENCOUNTER — Emergency Department (HOSPITAL_COMMUNITY)
Admission: EM | Admit: 2013-08-20 | Discharge: 2013-08-20 | Disposition: A | Discharge status: Home / Self Care | Payer: Medicare Other | Attending: Emergency Medicine | Admitting: Emergency Medicine

## 2013-08-20 DIAGNOSIS — F32A Depression, unspecified: Secondary | ICD-10-CM

## 2013-08-20 DIAGNOSIS — R259 Unspecified abnormal involuntary movements: Secondary | ICD-10-CM | POA: Insufficient documentation

## 2013-08-20 DIAGNOSIS — F17200 Nicotine dependence, unspecified, uncomplicated: Secondary | ICD-10-CM | POA: Insufficient documentation

## 2013-08-20 DIAGNOSIS — F3289 Other specified depressive episodes: Secondary | ICD-10-CM | POA: Insufficient documentation

## 2013-08-20 DIAGNOSIS — R45851 Suicidal ideations: Secondary | ICD-10-CM | POA: Insufficient documentation

## 2013-08-20 DIAGNOSIS — F411 Generalized anxiety disorder: Secondary | ICD-10-CM | POA: Insufficient documentation

## 2013-08-20 DIAGNOSIS — H109 Unspecified conjunctivitis: Secondary | ICD-10-CM

## 2013-08-20 DIAGNOSIS — Z8679 Personal history of other diseases of the circulatory system: Secondary | ICD-10-CM | POA: Insufficient documentation

## 2013-08-20 DIAGNOSIS — F1910 Other psychoactive substance abuse, uncomplicated: Secondary | ICD-10-CM

## 2013-08-20 DIAGNOSIS — Z79899 Other long term (current) drug therapy: Secondary | ICD-10-CM | POA: Insufficient documentation

## 2013-08-20 DIAGNOSIS — F329 Major depressive disorder, single episode, unspecified: Secondary | ICD-10-CM

## 2013-08-20 HISTORY — DX: Other psychoactive substance use, unspecified with psychoactive substance-induced mood disorder: F19.94

## 2013-08-20 HISTORY — DX: Other psychoactive substance abuse, uncomplicated: F19.10

## 2013-08-20 LAB — COMPREHENSIVE METABOLIC PANEL
AST: 58 U/L — ABNORMAL HIGH (ref 0–37)
BUN: 9 mg/dL (ref 6–23)
CO2: 23 mEq/L (ref 19–32)
Calcium: 9.7 mg/dL (ref 8.4–10.5)
Chloride: 99 mEq/L (ref 96–112)
Creatinine, Ser: 1.2 mg/dL (ref 0.50–1.35)
GFR calc Af Amer: 76 mL/min — ABNORMAL LOW (ref >90)
GFR calc non Af Amer: 66 mL/min — ABNORMAL LOW (ref >90)
Glucose, Bld: 98 mg/dL (ref 70–99)
Sodium: 135 mEq/L (ref 135–145)
Total Bilirubin: 0.9 mg/dL (ref 0.3–1.2)

## 2013-08-20 LAB — CBC WITH DIFFERENTIAL/PLATELET
Basophils Absolute: 0.1 10*3/uL (ref 0.0–0.1)
Eosinophils Relative: 1 % (ref 0–5)
HCT: 45.7 % (ref 39.0–52.0)
Hemoglobin: 16.2 g/dL (ref 13.0–17.0)
Lymphocytes Relative: 17 % (ref 12–46)
Lymphs Abs: 2.3 10*3/uL (ref 0.7–4.0)
MCHC: 35.4 g/dL (ref 30.0–36.0)
MCV: 107.8 fL — ABNORMAL HIGH (ref 78.0–100.0)
Monocytes Absolute: 1.1 10*3/uL — ABNORMAL HIGH (ref 0.1–1.0)
Monocytes Relative: 8 % (ref 3–12)
Neutro Abs: 9.6 10*3/uL — ABNORMAL HIGH (ref 1.7–7.7)
Platelets: 251 10*3/uL (ref 150–400)
RDW: 15.1 % (ref 11.5–15.5)
WBC: 13.3 10*3/uL — ABNORMAL HIGH (ref 4.0–10.5)

## 2013-08-20 LAB — RAPID URINE DRUG SCREEN, HOSP PERFORMED
Barbiturates: NOT DETECTED
Benzodiazepines: POSITIVE — AB
Cocaine: NOT DETECTED
Opiates: NOT DETECTED

## 2013-08-20 MED ORDER — SERTRALINE HCL 50 MG PO TABS
100.0000 mg | ORAL_TABLET | Freq: Two times a day (BID) | ORAL | Status: DC
  Administered 2013-08-20: 100 mg via ORAL
  Filled 2013-08-20: qty 2

## 2013-08-20 MED ORDER — NICOTINE 21 MG/24HR TD PT24: 21.0000 mg | MEDICATED_PATCH | Freq: Every day | TRANSDERMAL | Status: DC

## 2013-08-20 MED ORDER — ONDANSETRON HCL 4 MG PO TABS: 4.0000 mg | ORAL_TABLET | Freq: Three times a day (TID) | ORAL | Status: DC | PRN

## 2013-08-20 MED ORDER — CLONAZEPAM 1 MG PO TABS
2.0000 mg | ORAL_TABLET | Freq: Three times a day (TID) | ORAL | Status: DC
  Administered 2013-08-20: 2 mg via ORAL
  Filled 2013-08-20: qty 2

## 2013-08-20 MED ORDER — LORAZEPAM 1 MG PO TABS
1.0000 mg | ORAL_TABLET | Freq: Once | ORAL | Status: AC
  Administered 2013-08-20: 1 mg via ORAL
  Filled 2013-08-20: qty 1

## 2013-08-20 MED ORDER — QUETIAPINE FUMARATE 100 MG PO TABS
100.0000 mg | ORAL_TABLET | Freq: Every day | ORAL | Status: DC
  Administered 2013-08-20: 100 mg via ORAL
  Filled 2013-08-20: qty 1

## 2013-08-20 MED ORDER — ADULT MULTIVITAMIN W/MINERALS CH: 1.0000 | ORAL_TABLET | Freq: Every morning | ORAL | Status: DC

## 2013-08-20 MED ORDER — IBUPROFEN 200 MG PO TABS: 600.0000 mg | ORAL_TABLET | Freq: Three times a day (TID) | ORAL | Status: DC | PRN

## 2013-08-20 MED ORDER — ALUM & MAG HYDROXIDE-SIMETH 200-200-20 MG/5ML PO SUSP: 30.0000 mL | ORAL | Status: DC | PRN

## 2013-08-20 MED ORDER — ACETAMINOPHEN 325 MG PO TABS: 650.0000 mg | ORAL_TABLET | ORAL | Status: DC | PRN

## 2013-08-20 MED ORDER — RAMELTEON 8 MG PO TABS
8.0000 mg | ORAL_TABLET | Freq: Every day | ORAL | Status: DC
  Administered 2013-08-20: 8 mg via ORAL
  Filled 2013-08-20 (×2): qty 1

## 2013-08-20 MED ORDER — GABAPENTIN 300 MG PO CAPS
300.0000 mg | ORAL_CAPSULE | Freq: Four times a day (QID) | ORAL | Status: DC
  Administered 2013-08-20: 300 mg via ORAL
  Filled 2013-08-20 (×2): qty 1

## 2013-08-20 NOTE — ED Provider Notes (Signed)
10:06 PM  Pt to be discharged.  BH does not feel patient needs any inpatient criteria. No current safety concerns. I was not directly involved in this patient's care.  Layla Maw Ward, DO 08/20/13 2206

## 2013-08-20 NOTE — Progress Notes (Signed)
Patient Discharge Instructions:  After Visit Summary (AVS):   Faxed to:  08/20/13 Discharge Summary Note:   Faxed to:  08/20/13 Psychiatric Admission Assessment Note:   Faxed to:  08/20/13 Suicide Risk Assessment - Discharge Assessment:   Faxed to:  08/20/13 Faxed/Sent to the Next Level Care provider:  08/20/13 Faxed to [facility] - Dr. Evert Kohl @ [phone] Faxed to [facility] @ [phone]  Jerelene Redden, 08/20/2013, 1:18 PM

## 2013-08-20 NOTE — ED Notes (Signed)
No acute distress noted at discharge 

## 2013-08-20 NOTE — ED Provider Notes (Signed)
CSN: [id number]     Arrival date & time 08/20/13  1327 History   First MD Initiated Contact with Patient 08/20/13 1336     Chief Complaint  Patient presents with  . Medical Clearance    HPI Pt was seen at 1355. Per pt, c/o gradual onset and worsening of persistent anxiety, depression and SI for the past several weeks. Pt was discharged from [facility] on 08/15/13 for alcohol detox. Pt was evaluated in the ED 3 days ago for same and discharged with outpatient resources. States he continues to feel "shakey" and "depressed." Voices vague SI, denies plan, denies SA. States his new meds "make me sleepy." States he called [facility] Dr. Dub Mikes today, and was told to come to the ED for further evaluation. Denies any other complaints. Denies HI, no hallucinations, no CP/SOB, no abd pain, no N/V/D, no back pain.    Past Medical History  Diagnosis Date  . Anxiety   . Depression   . Varicose vein   . Substance abuse   . Substance induced mood disorder    Past Surgical History  Procedure Laterality Date  . Vein ligation and stripping    . Cholecystectomy    . Sinusotomy     Family History  Problem Relation Age of Onset  . Depression Mother   . Anxiety disorder Mother   . Cancer Father    History  Substance Use Topics  . Smoking status: Current Every Day Smoker -- 0.50 packs/day    Types: Cigarettes  . Smokeless tobacco: Never Used  . Alcohol Use: Yes     Comment: last drink a month ago    Review of Systems ROS: Statement: All systems negative except as marked or noted in the HPI; Constitutional: Negative for fever and chills. ; ; Eyes: Negative for eye pain, redness and discharge. ; ; ENMT: Negative for ear pain, hoarseness, nasal congestion, sinus pressure and sore throat. ; ; Cardiovascular: Negative for chest pain, palpitations, diaphoresis, dyspnea and peripheral edema. ; ; Respiratory: Negative for cough, wheezing and stridor. ; ; Gastrointestinal: Negative for nausea, vomiting, diarrhea,  abdominal pain, blood in stool, hematemesis, jaundice and rectal bleeding. . ; ; Genitourinary: Negative for dysuria, flank pain and hematuria. ; ; Musculoskeletal: Negative for back pain and neck pain. Negative for swelling and trauma.; ; Skin: Negative for pruritus, rash, abrasions, blisters, bruising and skin lesion.; ; Neuro: Negative for headache, lightheadedness and neck stiffness. Negative for weakness, altered level of consciousness , altered mental status, extremity weakness, paresthesias, involuntary movement, seizure and syncope.; Psych:  +depression, anxiety, vague SI. Denies SA, no HI, no hallucinations.     Allergies  Review of patient's allergies indicates no known allergies.  Home Medications   Current Outpatient Rx  Name  Route  Sig  Dispense  Refill  . clonazePAM (KLONOPIN) 2 MG tablet   Oral   Take 2 mg by mouth 3 (three) times daily.         Marland Kitchen gabapentin (NEURONTIN) 300 MG capsule   Oral   Take 1 capsule (300 mg total) by mouth 4 (four) times daily. For anxiety/discomfort   120 capsule   0   . Multiple Vitamin (MULTIVITAMIN WITH MINERALS) TABS tablet   Oral   Take 1 tablet by mouth every morning.         Marland Kitchen QUEtiapine (SEROQUEL) 100 MG tablet   Oral   Take 1 tablet (100 mg total) by mouth at bedtime. For thoughts   30 tablet  0   . ramelteon (ROZEREM) 8 MG tablet   Oral   Take 1 tablet (8 mg total) by mouth at bedtime. For sleep   30 tablet   0   . sertraline (ZOLOFT) 100 MG tablet   Oral   Take 1 tablet (100 mg total) by mouth 2 (two) times daily. For depression/anxiety   60 tablet   0    BP 111/73  Pulse 80  Temp(Src) 98.7 F (37.1 C) (Oral)  Resp 16  Ht 6\' 2"  (1.88 m)  Wt 240 lb (108.863 kg)  BMI 30.80 kg/m2  SpO2 96% Physical Exam 1400: Physical examination:  Nursing notes reviewed; Vital signs and O2 SAT reviewed;  Constitutional: Well developed, Well nourished, Well hydrated, In no acute distress; Head:  Normocephalic, atraumatic;  Eyes: EOMI, PERRL, No scleral icterus. +right conjunctival injection. No obvious hyphema or hypopyon.; ENMT: Mouth and pharynx normal, Mucous membranes moist; Neck: Supple, Full range of motion, No lymphadenopathy; Cardiovascular: Regular rate and rhythm, No gallop; Respiratory: Breath sounds clear & equal bilaterally, No wheezes.  Speaking full sentences with ease, Normal respiratory effort/excursion; Chest: Nontender, Movement normal; Abdomen: Soft, Nontender, Nondistended, Normal bowel sounds; Genitourinary: No CVA tenderness; Extremities: Pulses normal, No tenderness, No edema, No calf edema or asymmetry.; Neuro: AA&Ox3, Major CN grossly intact.  Speech clear. Climbs on and off stretcher easily by himself. Gait steady. No gross focal motor or sensory deficits in extremities.; Skin: Color normal, Warm, Dry.; Psych:  Affect flat, poor eye contact. No tremors.     ED Course  Procedures    EKG Interpretation   None       MDM  MDM Reviewed: previous chart, nursing note and vitals Reviewed previous: labs Interpretation: labs   Results for orders placed during the hospital encounter of 08/20/13  CBC WITH DIFFERENTIAL      Result Value Range   WBC 13.3 (*) 4.0 - 10.5 K/uL   RBC 4.24  4.22 - 5.81 MIL/uL   Hemoglobin 16.2  13.0 - 17.0 g/dL   HCT 16.1  09.6 - 04.5 %   MCV 107.8 (*) 78.0 - 100.0 fL   MCH 38.2 (*) 26.0 - 34.0 pg   MCHC 35.4  30.0 - 36.0 g/dL   RDW 40.9  81.1 - 91.4 %   Platelets 251  150 - 400 K/uL   Neutrophils Relative % 72  43 - 77 %   Neutro Abs 9.6 (*) 1.7 - 7.7 K/uL   Lymphocytes Relative 17  12 - 46 %   Lymphs Abs 2.3  0.7 - 4.0 K/uL   Monocytes Relative 8  3 - 12 %   Monocytes Absolute 1.1 (*) 0.1 - 1.0 K/uL   Eosinophils Relative 1  0 - 5 %   Eosinophils Absolute 0.2  0.0 - 0.7 K/uL   Basophils Relative 1  0 - 1 %   Basophils Absolute 0.1  0.0 - 0.1 K/uL  COMPREHENSIVE METABOLIC PANEL      Result Value Range   Sodium 135  135 - 145 mEq/L   Potassium 3.6   3.5 - 5.1 mEq/L   Chloride 99  96 - 112 mEq/L   CO2 23  19 - 32 mEq/L   Glucose, Bld 98  70 - 99 mg/dL   BUN 9  6 - 23 mg/dL   Creatinine, Ser 7.82  0.50 - 1.35 mg/dL   Calcium 9.7  8.4 - 95.6 mg/dL   Total Protein 8.8 (*) 6.0 - 8.3 g/dL  Albumin 3.8  3.5 - 5.2 g/dL   AST 58 (*) 0 - 37 U/L   ALT 29  0 - 53 U/L   Alkaline Phosphatase 74  39 - 117 U/L   Total Bilirubin 0.9  0.3 - 1.2 mg/dL   GFR calc non Af Amer 66 (*) >90 mL/min   GFR calc Af Amer 76 (*) >90 mL/min  URINE RAPID DRUG SCREEN (HOSP PERFORMED)      Result Value Range   Opiates NONE DETECTED  NONE DETECTED   Cocaine NONE DETECTED  NONE DETECTED   Benzodiazepines POSITIVE (*) NONE DETECTED   Amphetamines NONE DETECTED  NONE DETECTED   Tetrahydrocannabinol NONE DETECTED  NONE DETECTED   Barbiturates NONE DETECTED  NONE DETECTED  ETHANOL      Result Value Range   Alcohol, Ethyl (B) <11  0 - 11 mg/dL     1308:  Pt given polytrim on his 11/21-11/22/14 ED visit for right conjunctivitis; states he is using it as prescribed. Pt denies eye injury, no eye pain, no visual changes. Pending TSS eval.    Laray Anger, DO 08/20/13 [phone]

## 2013-08-20 NOTE — ED Notes (Signed)
Patient reports that he was discharged from [facility] on 09/12/13 for alcohol detox. Patient reports that he called [facility] and told them that he was having suicidal thoughts and continued to have tremors. Patient states he was started on new medicine and all he wants to do is sleep. Patient states his last drank alcohol 1 month ago.

## 2013-08-20 NOTE — BH Assessment (Signed)
Assessment Note  Cory Foster is an 57 y.o. male presents to [facility] requesting treatment for symptoms of depression, "withdrawal and the shakes". Pt is oriented x'4. Pt was at [facility] from 08/08/13-08/15/13 and diagnosed with generalized anxiety disorder. Pt states he feels "very depressed" with symptoms including excessive sleep, staying in bed, feeling shaky and unsteady, poor concentration and feeling hopeless and worthless. Pt reports feeling anxious and states he feels he was discharged from [facility] too soon. Writer asked if he requested to be discharged and the pt said "they asked me if I wanted to go". Pt denies suicidal ideation or any history of suicide attempts or intentional self-harm. He denies homicidal ideation or any history of violence. Pt denies any current psychotic symptoms. Pt denies relapsing on alcohol or using any substances. Pt reports his follow up appointment with [facility] on 08/20/13 and said "I got an appointment next week.  Pt appears disheveled, with fair eye contact, soft speech and slow motor behavior. Pt is drowsy but oriented x4. Pt thought process is coherent and relevant, mood is anxious and depressed and affect is depressed. Pt insight is poor and judgment is poor. Pt is calm and cooperative throughout assessment. When asked what kind of help he felt he needed pt said "I'm having withdrawals and the shakes". Writer informed the pt that he is negative for etoh and the pt said "oh, but I got the shakes". Writer observed the pt's hands and his hands were void of shaking. Writer asked pt where he is staying and pt said with my son. Ranae Pila, LCAS, ICAADC   (see previous CSW note 08/18/13) 08/20/2013 9:58 PM  Axis I: Anxiety Disorder NOS, Substance Induced Mood Disorder and Malingering Axis II: Deferred Axis III:  Past Medical History  Diagnosis Date  . Anxiety   . Depression   . Varicose vein   . Substance abuse   . Substance induced  mood disorder    Axis IV: economic problems, housing problems, other psychosocial or environmental problems, problems related to social environment, problems with access to health care services and problems with primary support group Axis V: 41-50 serious symptoms  Past Medical History:  Past Medical History  Diagnosis Date  . Anxiety   . Depression   . Varicose vein   . Substance abuse   . Substance induced mood disorder     Past Surgical History  Procedure Laterality Date  . Vein ligation and stripping    . Cholecystectomy    . Sinusotomy      Family History:  Family History  Problem Relation Age of Onset  . Depression Mother   . Anxiety disorder Mother   . Cancer Father     Social History:  reports that he has been smoking Cigarettes.  He has been smoking about 0.50 packs per day. He has never used smokeless tobacco. He reports that he drinks alcohol. He reports that he does not use illicit drugs.  Additional Social History:     CIWA: CIWA-Ar BP: 124/84 mmHg Pulse Rate: 70 COWS:    Allergies: No Known Allergies  Home Medications:  (Not in a hospital admission)  OB/GYN Status:  No LMP for male patient.  General Assessment Data Location of Assessment: [facility] ED Is this a Tele or Face-to-Face Assessment?: Face-to-Face Is this an Initial Assessment or a Re-assessment for this encounter?: Initial Assessment Living Arrangements: Other (Comment) Can pt return to current living arrangement?: Yes Admission Status: Voluntary Is patient  capable of signing voluntary admission?: Yes Transfer from: Home Referral Source: Self/Family/Friend     [facility] Crisis Care Plan Living Arrangements: Other (Comment) Name of Psychiatrist: [facility] Name of Therapist: [facility]     Risk to self Suicidal Ideation: No Suicidal Intent: No Is patient at risk for suicide?: No Suicidal Plan?: No Access to Means: No What has been your use of  drugs/alcohol within the last 12 months?:  (etoh) Previous Attempts/Gestures: No Triggers for Past Attempts: None known Intentional Self Injurious Behavior: None Family Suicide History: No Recent stressful life event(s): Loss (Comment);Financial Problems Persecutory voices/beliefs?: No Depression: Yes Depression Symptoms: Feeling worthless/self pity Substance abuse history and/or treatment for substance abuse?: Yes Suicide prevention information given to non-admitted patients: Not applicable  Risk to Others Homicidal Ideation: No Thoughts of Harm to Others: No Current Homicidal Intent: No Current Homicidal Plan: No Access to Homicidal Means: No History of harm to others?: No Assessment of Violence: None Noted Does patient have access to weapons?: No Criminal Charges Pending?: No Does patient have a court date: No  Psychosis Hallucinations: None noted Delusions: None noted  Mental Status Report Appear/Hygiene: Disheveled Eye Contact: Fair Motor Activity: Freedom of movement Speech: Logical/coherent;Soft Level of Consciousness: Alert;Drowsy Mood: Depressed Affect: Appropriate to circumstance Anxiety Level: None Thought Processes: Coherent;Relevant Judgement: Unimpaired Orientation: Person;Place;Time;Situation;Appropriate for developmental age Obsessive Compulsive Thoughts/Behaviors: None  Cognitive Functioning Concentration: Normal Memory: Recent Intact;Remote Intact IQ: Average Insight: Poor Impulse Control: Poor Appetite: Poor Sleep: No Change Total Hours of Sleep:  (5/24) Vegetative Symptoms: Not bathing;Decreased grooming  ADLScreening [facility] Assessment Services) Patient's cognitive ability adequate to safely complete daily activities?: Yes Patient able to express need for assistance with ADLs?: Yes Independently performs ADLs?: Yes (appropriate for developmental age)  Prior Inpatient Therapy Prior Inpatient Therapy: Yes Prior Therapy Dates:  08/08/13-08/15/13; 2004 (07/2013) Prior Therapy Facilty/Provider(s): [facility] Reason for Treatment: Alcohol dependence, depression  Prior Outpatient Therapy Prior Outpatient Therapy: Yes Prior Therapy Dates: 2014 Prior Therapy Facilty/Provider(s): [facility] in [place], [place] Reason for Treatment: Anxiety and depression  ADL Screening (condition at time of admission) Patient's cognitive ability adequate to safely complete daily activities?: Yes Patient able to express need for assistance with ADLs?: Yes Independently performs ADLs?: Yes (appropriate for developmental age)         Values / Beliefs Cultural Requests During Hospitalization: None Spiritual Requests During Hospitalization: None        Additional Information 1:1 In Past 12 Months?: No CIRT Risk: No Elopement Risk: No Does patient have medical clearance?: Yes     Disposition: Pt does not meet criteria for psychiatric inpatient treatment. Pt case discussed with Donell Sievert, PA. Pt is being provided with homeless resources and [facility] Treatment phone number to confirm his scheduled appointment this week.  Disposition Initial Assessment Completed for this Encounter: Yes Disposition of Patient: Other dispositions Type of outpatient treatment: Adult;Psych Intensive Outpatient  On Site Evaluation by:   Reviewed with Physician:    Manual Meier 08/20/2013 9:55 PM

## 2013-08-21 ENCOUNTER — Telehealth: Payer: Self-pay | Admitting: *Deleted

## 2013-08-21 ENCOUNTER — Emergency Department (HOSPITAL_COMMUNITY): Payer: Medicare Other

## 2013-08-21 ENCOUNTER — Encounter (HOSPITAL_COMMUNITY): Payer: Self-pay | Admitting: Emergency Medicine

## 2013-08-21 ENCOUNTER — Emergency Department (HOSPITAL_COMMUNITY)
Admission: EM | Admit: 2013-08-21 | Discharge: 2013-08-21 | Disposition: A | Discharge status: Home / Self Care | Payer: Medicare Other | Attending: Emergency Medicine | Admitting: Emergency Medicine

## 2013-08-21 DIAGNOSIS — R443 Hallucinations, unspecified: Secondary | ICD-10-CM | POA: Insufficient documentation

## 2013-08-21 DIAGNOSIS — F17200 Nicotine dependence, unspecified, uncomplicated: Secondary | ICD-10-CM | POA: Insufficient documentation

## 2013-08-21 DIAGNOSIS — F3289 Other specified depressive episodes: Secondary | ICD-10-CM | POA: Insufficient documentation

## 2013-08-21 DIAGNOSIS — Z8679 Personal history of other diseases of the circulatory system: Secondary | ICD-10-CM | POA: Insufficient documentation

## 2013-08-21 DIAGNOSIS — W19XXXA Unspecified fall, initial encounter: Secondary | ICD-10-CM

## 2013-08-21 DIAGNOSIS — F329 Major depressive disorder, single episode, unspecified: Secondary | ICD-10-CM

## 2013-08-21 DIAGNOSIS — IMO0002 Reserved for concepts with insufficient information to code with codable children: Secondary | ICD-10-CM | POA: Insufficient documentation

## 2013-08-21 DIAGNOSIS — F339 Major depressive disorder, recurrent, unspecified: Secondary | ICD-10-CM | POA: Insufficient documentation

## 2013-08-21 DIAGNOSIS — R296 Repeated falls: Secondary | ICD-10-CM | POA: Insufficient documentation

## 2013-08-21 DIAGNOSIS — Y9389 Activity, other specified: Secondary | ICD-10-CM | POA: Insufficient documentation

## 2013-08-21 DIAGNOSIS — Z79899 Other long term (current) drug therapy: Secondary | ICD-10-CM | POA: Insufficient documentation

## 2013-08-21 DIAGNOSIS — F411 Generalized anxiety disorder: Secondary | ICD-10-CM | POA: Insufficient documentation

## 2013-08-21 DIAGNOSIS — R259 Unspecified abnormal involuntary movements: Secondary | ICD-10-CM | POA: Insufficient documentation

## 2013-08-21 DIAGNOSIS — Y92009 Unspecified place in unspecified non-institutional (private) residence as the place of occurrence of the external cause: Secondary | ICD-10-CM | POA: Insufficient documentation

## 2013-08-21 NOTE — ED Provider Notes (Signed)
Medical screening examination/treatment/procedure(s) were performed by non-physician practitioner and as supervising physician I was immediately available for consultation/collaboration.  EKG Interpretation   None        Raeford Razor, MD 08/21/13 1950

## 2013-08-21 NOTE — Telephone Encounter (Signed)
Sam - suggest he contact the [facility] - they handle psychiatric cases and adjust medicines with sliding scale fee schedule

## 2013-08-21 NOTE — Telephone Encounter (Signed)
Pt is calling was discharged from [facility] & he has been given Rx's that he feels need to be adjusted says he dosent know what  Directions were for him? At discharge who would monitor meds ect. hes asking for advise or appt here? New Rx's are  seroquel rozerm zoloft Ativan Klonopin neurontin  Feels like a zombie, feels awful  * please advise
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
## 2013-08-21 NOTE — ED Notes (Signed)
Bed: WLPT4 Expected date:  Expected time:  Means of arrival:  Comments: EMS-FALL

## 2013-08-21 NOTE — ED Notes (Signed)
Pt was here yesterday in Psych Ed and was d/c with several different medications. States that he was too 'doped up' and fell before he could get to his front door when [employer] dropped him off. States he wants to go to [facility].

## 2013-08-27 ENCOUNTER — Encounter: Payer: Self-pay | Admitting: Internal Medicine

## 2013-08-27 DIAGNOSIS — K219 Gastro-esophageal reflux disease without esophagitis: Secondary | ICD-10-CM | POA: Insufficient documentation

## 2013-08-27 DIAGNOSIS — IMO0002 Reserved for concepts with insufficient information to code with codable children: Secondary | ICD-10-CM | POA: Insufficient documentation

## 2013-08-27 DIAGNOSIS — E785 Hyperlipidemia, unspecified: Secondary | ICD-10-CM | POA: Insufficient documentation

## 2013-08-27 DIAGNOSIS — F1910 Other psychoactive substance abuse, uncomplicated: Secondary | ICD-10-CM | POA: Insufficient documentation

## 2013-08-27 DIAGNOSIS — I10 Essential (primary) hypertension: Secondary | ICD-10-CM | POA: Insufficient documentation

## 2013-08-27 DIAGNOSIS — L409 Psoriasis, unspecified: Secondary | ICD-10-CM | POA: Insufficient documentation

## 2013-08-27 DIAGNOSIS — F329 Major depressive disorder, single episode, unspecified: Secondary | ICD-10-CM | POA: Insufficient documentation

## 2013-08-27 DIAGNOSIS — F419 Anxiety disorder, unspecified: Secondary | ICD-10-CM | POA: Insufficient documentation

## 2013-08-27 DIAGNOSIS — F32A Depression, unspecified: Secondary | ICD-10-CM | POA: Insufficient documentation

## 2013-08-29 ENCOUNTER — Ambulatory Visit: Payer: Self-pay | Admitting: Emergency Medicine

## 2013-09-01 ENCOUNTER — Emergency Department (HOSPITAL_COMMUNITY)
Admission: EM | Admit: 2013-09-01 | Discharge: 2013-09-01 | Disposition: A | Discharge status: Home / Self Care | Payer: Medicare Other | Attending: Emergency Medicine | Admitting: Emergency Medicine

## 2013-09-01 ENCOUNTER — Emergency Department (HOSPITAL_COMMUNITY): Payer: Medicare Other

## 2013-09-01 ENCOUNTER — Encounter (HOSPITAL_COMMUNITY): Payer: Self-pay | Admitting: Emergency Medicine

## 2013-09-01 DIAGNOSIS — F10929 Alcohol use, unspecified with intoxication, unspecified: Secondary | ICD-10-CM

## 2013-09-01 DIAGNOSIS — IMO0002 Reserved for concepts with insufficient information to code with codable children: Secondary | ICD-10-CM | POA: Insufficient documentation

## 2013-09-01 DIAGNOSIS — F1010 Alcohol abuse, uncomplicated: Secondary | ICD-10-CM | POA: Insufficient documentation

## 2013-09-01 DIAGNOSIS — S0990XA Unspecified injury of head, initial encounter: Secondary | ICD-10-CM | POA: Insufficient documentation

## 2013-09-01 DIAGNOSIS — S298XXA Other specified injuries of thorax, initial encounter: Secondary | ICD-10-CM | POA: Insufficient documentation

## 2013-09-01 DIAGNOSIS — S59909A Unspecified injury of unspecified elbow, initial encounter: Secondary | ICD-10-CM | POA: Insufficient documentation

## 2013-09-01 DIAGNOSIS — S6990XA Unspecified injury of unspecified wrist, hand and finger(s), initial encounter: Secondary | ICD-10-CM | POA: Insufficient documentation
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
## 2013-09-01 NOTE — ED Notes (Signed)
Pt not in room.
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
## 2013-09-01 NOTE — ED Provider Notes (Signed)
[name] left the ED without being seen, assessed, or examined. This provider did not partake in any decision making or diagnoses of this patient.   Raymon Mutton, PA-C 09/01/13 2033
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
## 2013-09-02 NOTE — ED Provider Notes (Signed)
Medical screening examination/treatment/procedure(s) were performed by non-physician practitioner and as supervising physician I was immediately available for consultation/collaboration.  EKG Interpretation   None         Suzi Roots, MD 09/02/13 1527

## 2013-09-11 NOTE — Progress Notes (Signed)
Patient ID: Cory Foster, male   DOB: Aug 27, 1956, 57 y.o.   MRN: [id number]   This encounter was created in error - please disregard.

## 2013-09-11 NOTE — Patient Instructions (Signed)
Alcohol Problems Most adults who drink alcohol drink in moderation (not a lot) are at low risk for developing problems related to their drinking. However, all drinkers, including low-risk drinkers, should know about the health risks connected with drinking alcohol. RECOMMENDATIONS FOR LOW-RISK DRINKING  Drink in moderation. Moderate drinking is defined as follows:   Men - no more than 2 drinks per day.  Nonpregnant women - no more than 1 drink per day.  Over age 54 - no more than 1 drink per day. A standard drink is 12 grams of pure alcohol, which is equal to a 12 ounce bottle of beer or wine cooler, a 5 ounce glass of wine, or 1.5 ounces of distilled spirits (such as whiskey, brandy, vodka, or rum).  ABSTAIN FROM (DO NOT DRINK) ALCOHOL:  When pregnant or considering pregnancy.  When taking a medication that interacts with alcohol.  If you are alcohol dependent.  A medical condition that prohibits drinking alcohol (such as ulcer, liver disease, or heart disease). DISCUSS WITH YOUR CAREGIVER:  If you are at risk for coronary heart disease, discuss the potential benefits and risks of alcohol use: Light to moderate drinking is associated with lower rates of coronary heart disease in certain populations (for example, men over age 18 and postmenopausal women). Infrequent or nondrinkers are advised not to begin light to moderate drinking to reduce the risk of coronary heart disease so as to avoid creating an alcohol-related problem. Similar protective effects can likely be gained through proper diet and exercise.  Women and the elderly have smaller amounts of body water than men. As a result women and the elderly achieve a higher blood alcohol concentration after drinking the same amount of alcohol.  Exposing a fetus to alcohol can cause a broad range of birth defects referred to as Fetal Alcohol Syndrome (FAS) or Alcohol-Related Birth Defects (ARBD). Although FAS/ARBD is connected  with excessive alcohol consumption during pregnancy, studies also have reported neurobehavioral problems in infants born to mothers reporting drinking an average of 1 drink per day during pregnancy.  Heavier drinking (the consumption of more than 4 drinks per occasion by men and more than 3 drinks per occasion by women) impairs learning (cognitive) and psychomotor functions and increases the risk of alcohol-related problems, including accidents and injuries. CAGE QUESTIONS:   Have you ever felt that you should Cut down on your drinking?  Have people Annoyed you by criticizing your drinking?  Have you ever felt bad or Guilty about your drinking?  Have you ever had a drink first thing in the morning to steady your nerves or get rid of a hangover (Eye opener)? If you answered positively to any of these questions: You may be at risk for alcohol-related problems if alcohol consumption is:   Men: Greater than 14 drinks per week or more than 4 drinks per occasion.  Women: Greater than 7 drinks per week or more than 3 drinks per occasion. Do you or your family have a medical history of alcohol-related problems, such as:  Blackouts.  Sexual dysfunction.  Depression.  Trauma.  Liver dysfunction.  Sleep disorders.  Hypertension.  Chronic abdominal pain.  Has your drinking ever caused you problems, such as problems with your family, problems with your work (or school) performance, or accidents/injuries?  Do you have a compulsion to drink or a preoccupation with drinking?  Do you have poor control or are you unable to stop drinking once you have started?  Do you have to drink to avoid withdrawal symptoms?  Do you have problems with withdrawal such as tremors, nausea, sweats, or mood disturbances?  Does it take more alcohol than in the past to get you high?  Do you feel a strong urge to drink?  Do you change your plans so that you can have a drink?  Do you ever drink in the  morning to relieve the shakes or a hangover? If you have answered a number of the previous questions positively, it may be time for you to talk to your caregivers, family, and friends and see if they think you have a problem. Alcoholism is a chemical dependency that keeps getting worse and will eventually destroy your health and relationships. Many alcoholics end up dead, impoverished, or in prison. This is often the end result of all chemical dependency.  Do not be discouraged if you are not ready to take action immediately.  Decisions to change behavior often involve up and down desires to change and feeling like you cannot decide.  Try to think more seriously about your drinking behavior.  Think of the reasons to quit. WHERE TO GO FOR ADDITIONAL INFORMATION   The National Institute on Alcohol Abuse and Alcoholism (NIAAA) BasicStudents.dk  [employer] on Alcoholism and Drug Dependence (NCADD) www.ncadd.org  American Society of Addiction Medicine (ASAM) RoyalDiary.gl  Document Released: 09/13/2005 Document Revised: 12/06/2011 Document Reviewed: 05/01/2008 [facility] Patient Information 2014 [place], [place]. Alcohol Use Disorder Alcohol use disorder is a mental disorder. It is not a one-time incident of heavy drinking. Alcohol use disorder is the excessive and uncontrollable use of alcohol over time that leads to problems with functioning in one or more areas of daily living. People with this disorder risk harming themselves and others when they drink to excess. Alcohol use disorder also can cause other mental disorders, such as mood and anxiety disorders, and serious physical problems. People with alcohol use disorder often misuse other drugs.  Alcohol use disorder is common and widespread. Some people with this disorder drink alcohol to cope with or escape from negative life events. Others drink to relieve chronic pain or symptoms of mental illness. People with a family history of  alcohol use disorder are at higher risk of losing control and using alcohol to excess.  SYMPTOMS  Signs and symptoms of alcohol use disorder may include the following:   Consumption ofalcohol inlarger amounts or over a longer period of time than intended.  Multiple unsuccessful attempts to cutdown or control alcohol use.   A great deal of time spent obtaining alcohol, using alcohol, or recovering from the effects of alcohol (hangover).  A strong desire or urge to use alcohol (cravings).   Continued use of alcohol despite problems at work, school, or home because of alcohol use.   Continued use of alcohol despite problems in relationships because of alcohol use.  Continued use of alcohol in situations when it is physically hazardous, such as driving a car.  Continued use of alcohol despite awareness of a physical or psychological problem that is likely related to alcohol use. Physical problems related to alcohol use can involve the brain, heart, liver, stomach, and intestines. Psychological problems related to alcohol use include intoxication, depression, anxiety, psychosis, delirium, and dementia.   The need for increased amounts of alcohol to achieve the same desired effect, or a decreased effect from the consumption of the same amount of alcohol (tolerance).  Withdrawal symptoms upon reducing or stopping alcohol use, or alcohol use  to reduce or avoid withdrawal symptoms. Withdrawal symptoms include:  Racing heart.  Hand tremor.  Difficulty sleeping.  Nausea.  Vomiting.  Hallucinations.  Restlessness.  Seizures. DIAGNOSIS Alcohol use disorder is diagnosed through an assessment by your caregiver. Your caregiver may start by asking three or four questions to screen for excessive or problematic alcohol use. To confirm a diagnosis of alcohol use disorder, at least two symptoms (see SYMPTOMS) must be present within a 52-month period. The severity of alcohol use disorder  depends on the number of symptoms:  Mild two or three.  Moderate four or five.  Severe six or more. Your caregiver may perform a physical exam or use results from lab tests to see if you have physical problems resulting from alcohol use. Your caregiver may refer you to a mental health professional for evaluation. TREATMENT  Some people with alcohol use disorder are able to reduce their alcohol use to low-risk levels. Some people with alcohol use disorder need to quit drinking alcohol. When necessary, mental health professionals with specialized training in substance use treatment can help. Your caregiver can help you decide how severe your alcohol use disorder is and what type of treatment you need. The following forms of treatment are available:   Detoxification. Detoxification involves the use of prescription medication to prevent alcohol withdrawal symptoms in the first week after quitting. This is important for people with a history of symptoms of withdrawal and for heavy drinkers who are likely to have withdrawal symptoms. Alcohol withdrawal can be dangerous and, in severe cases, cause death. Detoxification is usually provided in a hospital or in-patient substance use treatment facility.  Counseling or talk therapy. Talk therapy is provided by substance use treatment counselors. It addresses the reasons people use alcohol and ways to keep them from drinking again. The goals of talk therapy are to help people with alcohol use disorder find healthy activities and ways to cope with life stress, to identify and avoid triggers for alcohol use, and to handle cravings, which can cause relapse.  Medication.Different medications can help treat alcohol use disorder through the following actions:  Decrease alcohol cravings.  Decrease the positive reward response felt from alcohol use.  Produce an uncomfortable physical reaction when alcohol is used (aversion therapy).  Support groups. Support  groups are run by people who have quit drinking. They provide emotional support, advice, and guidance. These forms of treatment are often combined. Some people with alcohol use disorder benefit from intensive combination treatment provided by specialized substance use treatment centers. Both inpatient and outpatient treatment programs are available. Document Released: 10/21/2004 Document Revised: 05/16/2013 Document Reviewed: 12/21/2012 [facility] Patient Information 2014 [place], [place]. Hypertension As your heart beats, it forces blood through your arteries. This force is your blood pressure. If the pressure is too high, it is called hypertension (HTN) or high blood pressure. HTN is dangerous because you may have it and not know it. High blood pressure may mean that your heart has to work harder to pump blood. Your arteries may be narrow or stiff. The extra work puts you at risk for heart disease, stroke, and other problems.  Blood pressure consists of two numbers, a higher number over a lower, 110/72, for example. It is stated as "110 over 72." The ideal is below 120 for the top number (systolic) and under 80 for the bottom (diastolic). Write down your blood pressure today. You should pay close attention to your blood pressure if you have certain conditions such as:  Heart failure.  Prior heart attack.  Diabetes  Chronic kidney disease.  Prior stroke.  Multiple risk factors for heart disease. To see if you have HTN, your blood pressure should be measured while you are seated with your arm held at the level of the heart. It should be measured at least twice. A one-time elevated blood pressure reading (especially in the Emergency Department) does not mean that you need treatment. There may be conditions in which the blood pressure is different between your right and left arms. It is important to see your caregiver soon for a recheck. Most people have essential hypertension which means that there  is not a specific cause. This type of high blood pressure may be lowered by changing lifestyle factors such as:  Stress.  Smoking.  Lack of exercise.  Excessive weight.  Drug/tobacco/alcohol use.  Eating less salt. Most people do not have symptoms from high blood pressure until it has caused damage to the body. Effective treatment can often prevent, delay or reduce that damage. TREATMENT  When a cause has been identified, treatment for high blood pressure is directed at the cause. There are a large number of medications to treat HTN. These fall into several categories, and your caregiver will help you select the medicines that are best for you. Medications may have side effects. You should review side effects with your caregiver. If your blood pressure stays high after you have made lifestyle changes or started on medicines,   Your medication(s) may need to be changed.  Other problems may need to be addressed.  Be certain you understand your prescriptions, and know how and when to take your medicine.  Be sure to follow up with your caregiver within the time frame advised (usually within two weeks) to have your blood pressure rechecked and to review your medications.  If you are taking more than one medicine to lower your blood pressure, make sure you know how and at what times they should be taken. Taking two medicines at the same time can result in blood pressure that is too low. SEEK IMMEDIATE MEDICAL CARE IF:  You develop a severe headache, blurred or changing vision, or confusion.  You have unusual weakness or numbness, or a faint feeling.  You have severe chest or abdominal pain, vomiting, or breathing problems. MAKE SURE YOU:   Understand these instructions.  Will watch your condition.  Will get help right away if you are not doing well or get worse. Document Released: 09/13/2005 Document Revised: 12/06/2011 Document Reviewed: 05/03/2008 [facility] Patient Information  2014 [place], [place].  Diabetes and Exercise Exercising regularly is important. It is not just about losing weight. It has many health benefits, such as:  Improving your overall fitness, flexibility, and endurance.  Increasing your bone density.  Helping with weight control.  Decreasing your body fat.  Increasing your muscle strength.  Reducing stress and tension.  Improving your overall health. People with diabetes who exercise gain additional benefits because exercise:  Reduces appetite.  Improves the body's use of blood sugar (glucose).  Helps lower or control blood glucose.  Decreases blood pressure.  Helps control blood lipids (such as cholesterol and triglycerides).  Improves the body's use of the hormone insulin by:  Increasing the body's insulin sensitivity.  Reducing the body's insulin needs.  Decreases the risk for heart disease because exercising:  Lowers cholesterol and triglycerides levels.  Increases the levels of good cholesterol (such as high-density lipoproteins [HDL]) in the body.  Lowers blood  glucose levels. YOUR ACTIVITY PLAN  Choose an activity that you enjoy and set realistic goals. Your health care provider or diabetes educator can help you make an activity plan that works for you. You can break activities into 2 or 3 sessions throughout the day. Doing so is as good as one long session. Exercise ideas include:  Taking the dog for a walk.  Taking the stairs instead of the elevator.  Dancing to your favorite song.  Doing your favorite exercise with a friend. RECOMMENDATIONS FOR EXERCISING WITH TYPE 1 OR TYPE 2 DIABETES   Check your blood glucose before exercising. If blood glucose levels are greater than 240 mg/dL, check for urine ketones. Do not exercise if ketones are present.  Avoid injecting insulin into areas of the body that are going to be exercised. For example, avoid injecting insulin into:  The arms when playing tennis.  The  legs when jogging.  Keep a record of:  Food intake before and after you exercise.  Expected peak times of insulin action.  Blood glucose levels before and after you exercise.  The type and amount of exercise you have done.  Review your records with your health care provider. Your health care provider will help you to develop guidelines for adjusting food intake and insulin amounts before and after exercising.  If you take insulin or oral hypoglycemic agents, watch for signs and symptoms of hypoglycemia. They include:  Dizziness.  Shaking.  Sweating.  Chills.  Confusion.  Drink plenty of water while you exercise to prevent dehydration or heat stroke. Body water is lost during exercise and must be replaced.  Talk to your health care provider before starting an exercise program to make sure it is safe for you. Remember, almost any type of activity is better than none. Document Released: 12/04/2003 Document Revised: 05/16/2013 Document Reviewed: 02/20/2013 [facility] Patient Information 2014 [place], [place]. Cholesterol Cholesterol is a white, waxy, fat-like protein needed by your body in small amounts. The liver makes all the cholesterol you need. It is carried from the liver by the blood through the blood vessels. Deposits (plaque) may build up on blood vessel walls. This makes the arteries narrower and stiffer. Plaque increases the risk for heart attack and stroke. You cannot feel your cholesterol level even if it is very high. The only way to know is by a blood test to check your lipid (fats) levels. Once you know your cholesterol levels, you should keep a record of the test results. Work with your caregiver to to keep your levels in the desired range. WHAT THE RESULTS MEAN:  Total cholesterol is a rough measure of all the cholesterol in your blood.  LDL is the so-called bad cholesterol. This is the type that deposits cholesterol in the walls of the arteries. You want this level  to be low.  HDL is the good cholesterol because it cleans the arteries and carries the LDL away. You want this level to be high.  Triglycerides are fat that the body can either burn for energy or store. High levels are closely linked to heart disease. DESIRED LEVELS:  Total cholesterol below 200.  LDL below 100 for people at risk, below 70 for very high risk.  HDL above 50 is good, above 60 is best.  Triglycerides below 150. HOW TO LOWER YOUR CHOLESTEROL:  Diet.  Choose fish or white meat chicken and [place], roasted or baked. Limit fatty cuts of red meat, fried foods, and processed meats, such as sausage  and lunch meat.  Eat lots of fresh fruits and vegetables. Choose whole grains, beans, pasta, potatoes and cereals.  Use only small amounts of olive, corn or canola oils. Avoid butter, mayonnaise, shortening or palm kernel oils. Avoid foods with trans-fats.  Use skim/nonfat milk and low-fat/nonfat yogurt and cheeses. Avoid whole milk, cream, ice cream, egg yolks and cheeses. Healthy desserts include angel food cake, ginger snaps, animal crackers, hard candy, popsicles, and low-fat/nonfat frozen yogurt. Avoid pastries, cakes, pies and cookies.  Exercise.  A regular program helps decrease LDL and raises HDL.  Helps with weight control.  Do things that increase your activity level like gardening, walking, or taking the stairs.  Medication.  May be prescribed by your caregiver to help lowering cholesterol and the risk for heart disease.  You may need medicine even if your levels are normal if you have several risk factors. HOME CARE INSTRUCTIONS   Follow your diet and exercise programs as suggested by your caregiver.  Take medications as directed.  Have blood work done when your caregiver feels it is necessary. MAKE SURE YOU:   Understand these instructions.  Will watch your condition.  Will get help right away if you are not doing well or get worse. Document Released:  06/08/2001 Document Revised: 12/06/2011 Document Reviewed: 11/29/2007 [facility] Patient Information 2014 [place], [place]. Vitamin D Deficiency Vitamin D is an important vitamin that your body needs. Having too little of it in your body is called a deficiency. A very bad deficiency can make your bones soft and can cause a condition called rickets.  Vitamin D is important to your body for different reasons, such as:   It helps your body absorb 2 minerals called calcium and phosphorus.  It helps make your bones healthy.  It may prevent some diseases, such as diabetes and multiple sclerosis.  It helps your muscles and heart. You can get vitamin D in several ways. It is a natural part of some foods. The vitamin is also added to some dairy products and cereals. Some people take vitamin D supplements. Also, your body makes vitamin D when you are in the sun. It changes the sun's rays into a form of the vitamin that your body can use. CAUSES   Not eating enough foods that contain vitamin D.  Not getting enough sunlight.  Having certain digestive system diseases that make it hard to absorb vitamin D. These diseases include Crohn's disease, chronic pancreatitis, and cystic fibrosis.  Having a surgery in which part of the stomach or small intestine is removed.  Being obese. Fat cells pull vitamin D out of your blood. That means that obese people may not have enough vitamin D left in their blood and in other body tissues.  Having chronic kidney or liver disease. RISK FACTORS Risk factors are things that make you more likely to develop a vitamin D deficiency. They include:  Being older.  Not being able to get outside very much.  Living in a nursing home.  Having had broken bones.  Having weak or thin bones (osteoporosis).  Having a disease or condition that changes how your body absorbs vitamin D.  Having dark skin.  Some medicines such as seizure medicines or steroids.  Being  overweight or obese. SYMPTOMS Mild cases of vitamin D deficiency may not have any symptoms. If you have a very bad case, symptoms may include:  Bone pain.  Muscle pain.  Falling often.  Broken bones caused by a minor injury, due to  osteoporosis. DIAGNOSIS A blood test is the best way to tell if you have a vitamin D deficiency. TREATMENT Vitamin D deficiency can be treated in different ways. Treatment for vitamin D deficiency depends on what is causing it. Options include:  Taking vitamin D supplements.  Taking a calcium supplement. Your caregiver will suggest what dose is best for you. HOME CARE INSTRUCTIONS  Take any supplements that your caregiver prescribes. Follow the directions carefully. Take only the suggested amount.  Have your blood tested 2 months after you start taking supplements.  Eat foods that contain vitamin D. Healthy choices include:  Fortified dairy products, cereals, or juices. Fortified means vitamin D has been added to the food. Check the label on the package to be sure.  Fatty fish like salmon or trout.  Eggs.  Oysters.  Do not use a tanning bed.  Keep your weight at a healthy level. Lose weight if you need to.  Keep all follow-up appointments. Your caregiver will need to perform blood tests to make sure your vitamin D deficiency is going away. SEEK MEDICAL CARE IF:  You have any questions about your treatment.  You continue to have symptoms of vitamin D deficiency.  You have nausea or vomiting.  You are constipated.  You feel confused.  You have severe abdominal or back pain. MAKE SURE YOU:  Understand these instructions.  Will watch your condition.  Will get help right away if you are not doing well or get worse. Document Released: 12/06/2011 Document Revised: 01/08/2013 Document Reviewed: 12/06/2011 [facility] Patient Information 2014 [place], [place].

## 2013-09-12 ENCOUNTER — Encounter: Payer: Self-pay | Admitting: Internal Medicine

## 2013-10-16 ENCOUNTER — Emergency Department: Payer: Self-pay | Admitting: Emergency Medicine

## 2013-10-16 LAB — URINALYSIS, COMPLETE
BLOOD: NEGATIVE
Bacteria: NONE SEEN
Bilirubin,UR: NEGATIVE
Glucose,UR: NEGATIVE mg/dL (ref 0–75)
KETONE: NEGATIVE
LEUKOCYTE ESTERASE: NEGATIVE
NITRITE: NEGATIVE
PH: 6 (ref 4.5–8.0)
Protein: NEGATIVE
RBC,UR: <1 /HPF (ref 0–5)
Specific Gravity: 1.018 (ref 1.003–1.030)
Squamous Epithelial: NONE SEEN

## 2013-10-16 LAB — COMPREHENSIVE METABOLIC PANEL
Albumin: 3.4 g/dL (ref 3.4–5.0)
Alkaline Phosphatase: 120 U/L — ABNORMAL HIGH
Anion Gap: 8 (ref 7–16)
BILIRUBIN TOTAL: 0.8 mg/dL (ref 0.2–1.0)
BUN: 6 mg/dL — ABNORMAL LOW (ref 7–18)
Calcium, Total: 9 mg/dL (ref 8.5–10.1)
Chloride: 101 mmol/L (ref 98–107)
Co2: 31 mmol/L (ref 21–32)
Creatinine: 0.97 mg/dL (ref 0.60–1.30)
EGFR (African American): >60
Glucose: 123 mg/dL — ABNORMAL HIGH (ref 65–99)
Osmolality: 278 (ref 275–301)
Potassium: 3.7 mmol/L (ref 3.5–5.1)
SGOT(AST): 49 U/L — ABNORMAL HIGH (ref 15–37)
SGPT (ALT): 36 U/L (ref 12–78)
Sodium: 140 mmol/L (ref 136–145)
Total Protein: 8.6 g/dL — ABNORMAL HIGH (ref 6.4–8.2)

## 2013-10-16 LAB — CBC WITH DIFFERENTIAL/PLATELET
Basophil #: 0.1 10*3/uL (ref 0.0–0.1)
Basophil %: 0.8 %
EOS ABS: 0.1 10*3/uL (ref 0.0–0.7)
Eosinophil %: 1 %
HCT: 47.7 % (ref 40.0–52.0)
HGB: 15.9 g/dL (ref 13.0–18.0)
LYMPHS ABS: 1.9 10*3/uL (ref 1.0–3.6)
Lymphocyte %: 29 %
MCH: 35.5 pg — ABNORMAL HIGH (ref 26.0–34.0)
MCHC: 33.5 g/dL (ref 32.0–36.0)
MCV: 106 fL — AB (ref 80–100)
MONO ABS: 0.6 x10 3/mm (ref 0.2–1.0)
Monocyte %: 9.8 %
NEUTROS ABS: 3.8 10*3/uL (ref 1.4–6.5)
Neutrophil %: 59.4 %
Platelet: 171 10*3/uL (ref 150–440)
RBC: 4.49 10*6/uL (ref 4.40–5.90)
RDW: 19.8 % — AB (ref 11.5–14.5)
WBC: 6.4 10*3/uL (ref 3.8–10.6)

## 2013-10-17 ENCOUNTER — Emergency Department: Payer: Self-pay | Admitting: Emergency Medicine

## 2013-10-17 LAB — COMPREHENSIVE METABOLIC PANEL
ALK PHOS: 96 U/L
ANION GAP: 6 — AB (ref 7–16)
AST: 35 U/L (ref 15–37)
Albumin: 2.7 g/dL — ABNORMAL LOW (ref 3.4–5.0)
BUN: 6 mg/dL — AB (ref 7–18)
Bilirubin,Total: 0.5 mg/dL (ref 0.2–1.0)
CREATININE: 0.79 mg/dL (ref 0.60–1.30)
Calcium, Total: 7.5 mg/dL — ABNORMAL LOW (ref 8.5–10.1)
Chloride: 105 mmol/L (ref 98–107)
Co2: 29 mmol/L (ref 21–32)
EGFR (Non-African Amer.): >60
Glucose: 100 mg/dL — ABNORMAL HIGH (ref 65–99)
Osmolality: 277 (ref 275–301)
POTASSIUM: 3.3 mmol/L — AB (ref 3.5–5.1)
SGPT (ALT): 27 U/L (ref 12–78)
Sodium: 140 mmol/L (ref 136–145)
Total Protein: 6.5 g/dL (ref 6.4–8.2)

## 2013-10-17 LAB — CBC
HCT: 41.5 % (ref 40.0–52.0)
HGB: 13.9 g/dL (ref 13.0–18.0)
MCH: 35.8 pg — ABNORMAL HIGH (ref 26.0–34.0)
MCHC: 33.6 g/dL (ref 32.0–36.0)
MCV: 107 fL — ABNORMAL HIGH (ref 80–100)
Platelet: 135 10*3/uL — ABNORMAL LOW (ref 150–440)
RBC: 3.89 10*6/uL — ABNORMAL LOW (ref 4.40–5.90)
RDW: 19.6 % — ABNORMAL HIGH (ref 11.5–14.5)
WBC: 5.3 10*3/uL (ref 3.8–10.6)

## 2013-10-17 LAB — URINALYSIS, COMPLETE
Bacteria: NONE SEEN
Bilirubin,UR: NEGATIVE
Blood: NEGATIVE
Glucose,UR: NEGATIVE mg/dL
Hyaline Cast: 1
Ketone: NEGATIVE
Leukocyte Esterase: NEGATIVE
Nitrite: NEGATIVE
Ph: 6
Protein: NEGATIVE
RBC,UR: <1 /HPF
Specific Gravity: 1.026
Squamous Epithelial: NONE SEEN
WBC UR: 1 /HPF

## 2013-10-17 LAB — LIPASE, BLOOD: LIPASE: 161 U/L (ref 73–393)

## 2013-10-17 LAB — TROPONIN I: Troponin-I: <0.02 ng/mL

## 2013-12-04 ENCOUNTER — Ambulatory Visit: Payer: Self-pay | Admitting: Ophthalmology

## 2014-01-08 ENCOUNTER — Ambulatory Visit: Payer: Self-pay | Admitting: Ophthalmology

## 2014-01-28 ENCOUNTER — Other Ambulatory Visit: Payer: Self-pay | Admitting: Internal Medicine

## 2014-05-05 ENCOUNTER — Other Ambulatory Visit: Payer: Self-pay | Admitting: Internal Medicine

## 2014-09-16 ENCOUNTER — Encounter: Payer: Self-pay | Admitting: Internal Medicine

## 2016-01-18 ENCOUNTER — Emergency Department (HOSPITAL_COMMUNITY)
Admission: EM | Admit: 2016-01-18 | Discharge: 2016-01-18 | Disposition: A | Discharge status: Home / Self Care | Payer: Commercial Managed Care - HMO | Attending: Emergency Medicine | Admitting: Emergency Medicine

## 2016-01-18 ENCOUNTER — Encounter (HOSPITAL_COMMUNITY): Payer: Self-pay | Admitting: Emergency Medicine

## 2016-01-18 DIAGNOSIS — F329 Major depressive disorder, single episode, unspecified: Secondary | ICD-10-CM | POA: Insufficient documentation

## 2016-01-18 DIAGNOSIS — K219 Gastro-esophageal reflux disease without esophagitis: Secondary | ICD-10-CM | POA: Diagnosis not present

## 2016-01-18 DIAGNOSIS — S8010XD Contusion of unspecified lower leg, subsequent encounter: Secondary | ICD-10-CM | POA: Insufficient documentation

## 2016-01-18 DIAGNOSIS — F10920 Alcohol use, unspecified with intoxication, uncomplicated: Secondary | ICD-10-CM

## 2016-01-18 DIAGNOSIS — Y92009 Unspecified place in unspecified non-institutional (private) residence as the place of occurrence of the external cause: Secondary | ICD-10-CM | POA: Diagnosis not present

## 2016-01-18 DIAGNOSIS — I10 Essential (primary) hypertension: Secondary | ICD-10-CM | POA: Diagnosis not present

## 2016-01-18 DIAGNOSIS — E785 Hyperlipidemia, unspecified: Secondary | ICD-10-CM | POA: Insufficient documentation

## 2016-01-18 DIAGNOSIS — F17210 Nicotine dependence, cigarettes, uncomplicated: Secondary | ICD-10-CM | POA: Diagnosis not present

## 2016-01-18 DIAGNOSIS — Y939 Activity, unspecified: Secondary | ICD-10-CM | POA: Diagnosis not present

## 2016-01-18 DIAGNOSIS — F10120 Alcohol abuse with intoxication, uncomplicated: Secondary | ICD-10-CM | POA: Diagnosis present

## 2016-01-18 DIAGNOSIS — Z79899 Other long term (current) drug therapy: Secondary | ICD-10-CM | POA: Insufficient documentation

## 2016-01-18 DIAGNOSIS — W19XXXA Unspecified fall, initial encounter: Secondary | ICD-10-CM | POA: Insufficient documentation

## 2016-01-18 DIAGNOSIS — Y999 Unspecified external cause status: Secondary | ICD-10-CM | POA: Insufficient documentation

## 2016-01-18 MED ORDER — ZIPRASIDONE MESYLATE 20 MG IM SOLR
INTRAMUSCULAR | Status: AC
  Filled 2016-01-18: qty 20

## 2016-01-18 MED ORDER — ZIPRASIDONE MESYLATE 20 MG IM SOLR
20.0000 mg | Freq: Once | INTRAMUSCULAR | Status: AC
  Administered 2016-01-18: 20 mg via INTRAMUSCULAR

## 2016-01-18 MED ORDER — STERILE WATER FOR INJECTION IJ SOLN
INTRAMUSCULAR | Status: AC
  Filled 2016-01-18: qty 10

## 2016-01-18 NOTE — ED Notes (Signed)
Bed: [id number] Expected date:  Expected time:  Means of arrival:  Comments: EMS 41M ETOH

## 2016-01-18 NOTE — ED Provider Notes (Signed)
Care assumed from previous provider PA Upstill, case discussed, plan agreed upon. Will reevaluate patient when  he is more awake alert with likely discharge home when stable.  Gen: Sleeping but arousable; afebrile, VSS, NAD HEENT: EOMI, MMM Resp: no resp distress CV: RRR, no MRG Abd: soft, NT, ND MsK: moving all extremities well Neuro: A&O x4  7:30 AM - Patient sleeping, awoke and evaluated patient. No complaints at this time. Will continue to monitor.   10:22 AM - Patient reevaluated. Ambulating in ED without assistance. No complaints at this time. Safe for discharge to home.  [facility] FloridaJaime Pilcher Ward, PA-C 01/18/16 1023  Jacalyn LefevreJulie Tamakia Porto, MD 01/21/16 [phone]

## 2016-01-18 NOTE — ED Provider Notes (Signed)
CSN: [id number]     Arrival date & time 01/18/16  0019 History   First MD Initiated Contact with Patient 01/18/16 0041     No chief complaint on file.    (Consider location/radiation/quality/duration/timing/severity/associated sxs/prior Treatment) HPI Comments: Arrives via EMS after they were called by sister of patient concerned regarding unwitnessed fall today while intoxicated. No vomiting. Sister reports to EMS concern for depressive symptoms in patient and substance abuse. The patient does not want evaluation. He states "I'm fine" and has no complaints.   The history is provided by the patient. No language interpreter was used.    Past Medical History  Diagnosis Date  . Varicose vein   . Substance induced mood disorder (HCC)   . Hypertension   . Hyperlipidemia   . GERD (gastroesophageal reflux disease)   . Depression   . Substance abuse   . Anxiety   . DDD (degenerative disc disease)   . Psoriasis    Past Surgical History  Procedure Laterality Date  . Vein ligation and stripping    . Cholecystectomy    . Sinusotomy     Family History  Problem Relation Age of Onset  . Depression Mother   . Anxiety disorder Mother   . Cancer Father    Social History  Substance Use Topics  . Smoking status: Current Every Day Smoker -- 0.50 packs/day    Types: Cigarettes  . Smokeless tobacco: Never Used  . Alcohol Use: Yes     Comment: couple of beers    Review of Systems  Constitutional: Negative for fever and chills.  HENT: Negative.   Respiratory: Negative.   Cardiovascular: Negative.   Gastrointestinal: Negative.   Musculoskeletal: Negative.   Skin: Negative.   Neurological: Negative.   Psychiatric/Behavioral: Negative for dysphoric mood.      Allergies  Ceclor; Ciprofloxacin; Citalopram; and Seroquel  Home Medications   Prior to Admission medications   Medication Sig Start Date End Date Taking? Authorizing Provider  clonazePAM (KLONOPIN) 2 MG tablet Take 2 mg  by mouth 3 (three) times daily as needed for anxiety.   Yes Historical Provider, MD  sertraline (ZOLOFT) 100 MG tablet Take 1 tablet (100 mg total) by mouth 2 (two) times daily. For depression/anxiety 08/15/13  Yes Rachael FeeIrving A Lugo, MD  ALPRAZolam Prudy Feeler(XANAX) 1 MG tablet Take 1 mg by mouth 3 (three) times daily as needed for anxiety.    Historical Provider, MD  Cholecalciferol (VITAMIN D-3) 5000 UNITS TABS Take 5,000 Units by mouth daily.     Historical Provider, MD  gabapentin (NEURONTIN) 300 MG capsule Take 1 capsule (300 mg total) by mouth 4 (four) times daily. For anxiety/discomfort 08/15/13   Rachael FeeIrving A Lugo, MD  Magnesium 250 MG TABS Take 250 mg by mouth daily.     Historical Provider, MD  Multiple Vitamin (MULTIVITAMIN WITH MINERALS) TABS tablet Take 1 tablet by mouth every morning.    Historical Provider, MD   BP 103/66 mmHg  Pulse 64  Temp(Src) 97.4 F (36.3 C) (Oral)  Resp 19  SpO2 93% Physical Exam  Constitutional: He is oriented to person, place, and time. He appears well-developed and well-nourished.  Patient is acutely intoxicated.  HENT:  Head: Normocephalic and atraumatic.  No hematoma, abrasion or wound to scalp.   Eyes: Conjunctivae are normal.  Neck: Normal range of motion. Neck supple.  Cardiovascular: Normal rate.   Pulmonary/Chest: Effort normal.  Abdominal: Soft. Bowel sounds are normal. There is no tenderness. There is no rebound and  no guarding.  Musculoskeletal: Normal range of motion.  Neurological: He is alert and oriented to person, place, and time.  Patient is unsteady when attempting to ambulate. Slurred speech that is coherent.  Skin: Skin is warm and dry. No rash noted.  Multiple bruises and abrasions to LE's in various stages of healing.     ED Course  Procedures (including critical care time) Labs Review Labs Reviewed - No data to display  Imaging Review No results found. I have personally reviewed and evaluated these images and lab results as part of  my medical decision-making.   EKG Interpretation None      MDM   Final diagnoses:  None    1. Acute alcohol intoxication 2. Fall  The patient is refusing treatment. He is acutely intoxicated but oriented. Discussed he would not be able to leave without a sober caretaker. He is attempting to call sister.   1:45 - patient's agitation escalating. He wants to leave but is felt unsafe for discharge with degree of intoxication. No family to take him home. He is making verbal threats to staff. He remains unsteady with ambulation and is considered a fall threat. He has demonstrated he is a danger to himself and staff. Geodon IM ordered for safety. Will continue to observe.   3:00 - patient is sleeping soundly. VSS.  5:05 - patient continues to sleep. No change.   Patient care signed out to [facility], PA-C, for re-evaluation when patient is awake. Will discharge home when stable.   Elpidio Anis, PA-C 01/18/16 0505  Geoffery Lyons, MD 01/18/16 [phone]

## 2016-01-18 NOTE — ED Notes (Signed)
Awake. Verbally responsive. A/O x4. Resp even and unlabored. No audible adventitious breath sounds noted. ABC's intact.  

## 2016-01-18 NOTE — ED Notes (Signed)
Pt comes to Ed via Ems, pt had a unwitnessed fall/ unsure about LOC at home about 1 hour ago, ETOH and Polypharmacy prescriptions possible  Reasons for fall. Sister called ems, and was concerned with his depression and behavior. Alert and orientated x4. No abrasions or contusions noted. Pt verbalizes tenderness in back and neck. Pt has a wraped  Towel around his neck. Normal sinus vs all within normal ranges. Pt is diabetic cbg 150 on ems report.  18 in left wrist.

## 2016-01-18 NOTE — ED Notes (Signed)
Resting quietly with eye closed. Easily arousable. Verbally responsive. Resp even and unlabored. ABC's intact. NAD noted.  

## 2016-01-18 NOTE — ED Notes (Signed)
Pt became verbally and physically aggressive towards staff when asked to sit down in order to avoid falling and injuring himself.  Pt refused, Geodon ordered and given.  Pt is sleeping at this time.  VSS.  Pt is on the monitor w/ VS cycling.  Will continue to monitor.

## 2016-01-18 NOTE — Discharge Instructions (Signed)
Alcohol Intoxication Alcohol intoxication occurs when you drink enough alcohol that it affects your ability to function. It can be mild or very severe. Drinking a lot of alcohol in a short time is called binge drinking. This can be very harmful. Drinking alcohol can also be more dangerous if you are taking medicines or other drugs. Some of the effects caused by alcohol may include:  Loss of coordination.  Changes in mood and behavior.  Unclear thinking.  Trouble talking (slurred speech).  Throwing up (vomiting).  Confusion.  Slowed breathing.  Twitching and shaking (seizures).  Loss of consciousness. HOME CARE  Do not drive after drinking alcohol.  Drink enough water and fluids to keep your pee (urine) clear or pale yellow. Avoid caffeine.  Only take medicine as told by your doctor. GET HELP IF:  You throw up (vomit) many times.  You do not feel better after a few days.  You frequently have alcohol intoxication. Your doctor can help decide if you should see a substance use treatment counselor. GET HELP RIGHT AWAY IF:  You become shaky when you stop drinking.  You have twitching and shaking.  You throw up blood. It may look bright red or like coffee grounds.  You notice blood in your poop (bowel movements).  You become lightheaded or pass out (faint). MAKE SURE YOU:   Understand these instructions.  Will watch your condition.  Will get help right away if you are not doing well or get worse.   This information is not intended to replace advice given to you by your health care provider. Make sure you discuss any questions you have with your health care provider.   Document Released: 03/01/2008 Document Revised: 05/16/2013 Document Reviewed: 02/16/2013 Elsevier Interactive Patient Education 2016 [employer].  Alcohol Use Disorder Alcohol use disorder is a mental disorder. It is not a one-time incident of heavy drinking. Alcohol use disorder is the excessive  and uncontrollable use of alcohol over time that leads to problems with functioning in one or more areas of daily living. People with this disorder risk harming themselves and others when they drink to excess. Alcohol use disorder also can cause other mental disorders, such as mood and anxiety disorders, and serious physical problems. People with alcohol use disorder often misuse other drugs.  Alcohol use disorder is common and widespread. Some people with this disorder drink alcohol to cope with or escape from negative life events. Others drink to relieve chronic pain or symptoms of mental illness. People with a family history of alcohol use disorder are at higher risk of losing control and using alcohol to excess.  Drinking too much alcohol can cause injury, accidents, and health problems. One drink can be too much when you are:  Working.  Pregnant or breastfeeding.  Taking medicines. Ask your doctor.  Driving or planning to drive. SYMPTOMS  Signs and symptoms of alcohol use disorder may include the following:   Consumption ofalcohol inlarger amounts or over a longer period of time than intended.  Multiple unsuccessful attempts to cutdown or control alcohol use.   A great deal of time spent obtaining alcohol, using alcohol, or recovering from the effects of alcohol (hangover).  A strong desire or urge to use alcohol (cravings).   Continued use of alcohol despite problems at work, school, or home because of alcohol use.   Continued use of alcohol despite problems in relationships because of alcohol use.  Continued use of alcohol in situations when it is physically hazardous,  such as driving a car.  Continued use of alcohol despite awareness of a physical or psychological problem that is likely related to alcohol use. Physical problems related to alcohol use can involve the brain, heart, liver, stomach, and intestines. Psychological problems related to alcohol use include  intoxication, depression, anxiety, psychosis, delirium, and dementia.   The need for increased amounts of alcohol to achieve the same desired effect, or a decreased effect from the consumption of the same amount of alcohol (tolerance).  Withdrawal symptoms upon reducing or stopping alcohol use, or alcohol use to reduce or avoid withdrawal symptoms. Withdrawal symptoms include:  Racing heart.  Hand tremor.  Difficulty sleeping.  Nausea.  Vomiting.  Hallucinations.  Restlessness.  Seizures. DIAGNOSIS Alcohol use disorder is diagnosed through an assessment by your health care provider. Your health care provider may start by asking three or four questions to screen for excessive or problematic alcohol use. To confirm a diagnosis of alcohol use disorder, at least two symptoms must be present within a 10132-month period. The severity of alcohol use disorder depends on the number of symptoms:  Mild--two or three.  Moderate--four or five.  Severe--six or more. Your health care provider may perform a physical exam or use results from lab tests to see if you have physical problems resulting from alcohol use. Your health care provider may refer you to a mental health professional for evaluation. TREATMENT  Some people with alcohol use disorder are able to reduce their alcohol use to low-risk levels. Some people with alcohol use disorder need to quit drinking alcohol. When necessary, mental health professionals with specialized training in substance use treatment can help. Your health care provider can help you decide how severe your alcohol use disorder is and what type of treatment you need. The following forms of treatment are available:   Detoxification. Detoxification involves the use of prescription medicines to prevent alcohol withdrawal symptoms in the first week after quitting. This is important for people with a history of symptoms of withdrawal and for heavy drinkers who are likely to  have withdrawal symptoms. Alcohol withdrawal can be dangerous and, in severe cases, cause death. Detoxification is usually provided in a hospital or in-patient substance use treatment facility.  Counseling or talk therapy. Talk therapy is provided by substance use treatment counselors. It addresses the reasons people use alcohol and ways to keep them from drinking again. The goals of talk therapy are to help people with alcohol use disorder find healthy activities and ways to cope with life stress, to identify and avoid triggers for alcohol use, and to handle cravings, which can cause relapse.  Medicines.Different medicines can help treat alcohol use disorder through the following actions:  Decrease alcohol cravings.  Decrease the positive reward response felt from alcohol use.  Produce an uncomfortable physical reaction when alcohol is used (aversion therapy).  Support groups. Support groups are run by people who have quit drinking. They provide emotional support, advice, and guidance. These forms of treatment are often combined. Some people with alcohol use disorder benefit from intensive combination treatment provided by specialized substance use treatment centers. Both inpatient and outpatient treatment programs are available.   This information is not intended to replace advice given to you by your health care provider. Make sure you discuss any questions you have with your health care provider.   Document Released: 10/21/2004 Document Revised: 10/04/2014 Document Reviewed: 12/21/2012 Elsevier Interactive Patient Education 2016 [employer]  Alcohol Abuse and Nutrition Alcohol abuse is any  pattern of alcohol consumption that harms your health, relationships, or work. Alcohol abuse can affect how your body breaks down and absorbs nutrients from food by causing your liver to work abnormally. Additionally, many people who abuse alcohol do not eat enough carbohydrates, protein, fat,  vitamins, and minerals. This can cause poor nutrition (malnutrition) and a lack of nutrients (nutrient deficiencies), which can lead to further complications. Nutrients that are commonly lacking (deficient) among people who abuse alcohol include:  Vitamins.  Vitamin A. This is stored in your liver. It is important for your vision, metabolism, and ability to fight off infections (immunity).  B vitamins. These include vitamins such as folate, thiamin, and niacin. These are important in new cell growth and maintenance.  Vitamin C. This plays an important role in iron absorption, wound healing, and immunity.  Vitamin D. This is produced by your liver, but you can also get vitamin D from food. Vitamin D is necessary for your body to absorb and use calcium.  Minerals.  Calcium. This is important for your bones and your heart and blood vessel (cardiovascular) function.  Iron. This is important for blood, muscle, and nervous system functioning.  Magnesium. This plays an important role in muscle and nerve function, and it helps to control blood sugar and blood pressure.  Zinc. This is important for the normal function of your nervous system and digestive system (gastrointestinal tract). Nutrition is an essential component of therapy for alcohol abuse. Your health care provider or dietitian will work with you to design a plan that can help restore nutrients to your body and prevent potential complications. WHAT IS MY PLAN? Your dietitian may develop a specific diet plan that is based on your condition and any other complications you may have. A diet plan will commonly include:  A balanced diet.  Grains: 6-8 oz per day.  Vegetables: 2-3 cups per day.  Fruits: 1-2 cups per day.  Meat and other protein: 5-6 oz per day.  Dairy: 2-3 cups per day.  Vitamin and mineral supplements. WHAT DO I NEED TO KNOW ABOUT ALCOHOL AND NUTRITION?  Consume foods that are high in antioxidants, such as  grapes, berries, nuts, green tea, and dark green and orange vegetables. This can help to counteract some of the stress that is placed on your liver by consuming alcohol.  Avoid food and drinks that are high in fat and sugar. Foods such as sugared soft drinks, salty snack foods, and candy contain empty calories. This means that they lack important nutrients such as protein, fiber, and vitamins.  Eat frequent meals and snacks. Try to eat 5-6 small meals each day.  Eat a variety of fresh fruits and vegetables each day. This will help you get plenty of water, fiber, and vitamins in your diet.  Drink plenty of water and other clear fluids. Try to drink at least 48-64 oz (1.5-2 L) of water per day.  If you are a vegetarian, eat a variety of protein-rich foods. Pair whole grains with plant-based proteins at meals and snacks to obtain the greatest nutrient benefit from your food. For example, eat rice with beans, put peanut butter on whole-grain toast, or eat oatmeal with sunflower seeds.  Soak beans and whole grains overnight before cooking. This can help your body to absorb the nutrients more easily.  Include foods fortified with vitamins and minerals in your diet. Commonly fortified foods include milk, orange juice, cereal, and bread.  If you are malnourished, your dietitian may recommend a  high-protein, high-calorie diet. This may include:  2,000-3,000 calories (kilocalories) per day.  70-100 grams of protein per day.  Your health care provider may recommend a complete nutritional supplement beverage. This can help to restore calories, protein, and vitamins to your body. Depending on your condition, you may be advised to consume this instead of or in addition to meals.  Limit your intake of caffeine. Replace drinks like coffee and black tea with decaffeinated coffee and herbal tea.  Eat a variety of foods that are high in omega fatty acids. These include fish, nuts and seeds, and soybeans.  These foods may help your liver to recover and may also stabilize your mood.  Certain medicines may cause changes in your appetite, taste, and weight. Work with your health care provider and dietitian to make any adjustments to your medicines and diet plan.  Include other healthy lifestyle choices in your daily routine.  Be physically active.  Get enough sleep.  Spend time doing activities that you enjoy.  If you are unable to take in enough food and calories by mouth, your health care provider may recommend a feeding tube. This is a tube that passes through your nose and throat, directly into your stomach. Nutritional supplement beverages can be given to you through the feeding tube to help you get the nutrients you need.  Take vitamin or mineral supplements as recommended by your health care provider. WHAT FOODS CAN I EAT? Grains Enriched pasta. Enriched rice. Fortified whole-grain bread. Fortified whole-grain cereal. Barley. Brown rice. Quinoa. Millet. Vegetables All fresh, frozen, and canned vegetables. Spinach. Kale. Artichoke. Carrots. Winter squash and pumpkin. Sweet potatoes. Broccoli. Cabbage. Cucumbers. Tomatoes. Sweet peppers. Green beans. Peas. Corn. Fruits All fresh and frozen fruits. Berries. Grapes. Mango. Papaya. Guava. Cherries. Apples. Bananas. Peaches. Plums. Pineapple. Watermelon. Cantaloupe. Oranges. Avocado. Meats and Other Protein Sources Beef liver. Lean beef. Pork. Fresh and canned chicken. Fresh fish. Oysters. Sardines. Canned tuna. Shrimp. Eggs with yolks. Nuts and seeds. Peanut butter. Beans and lentils. Soybeans. Tofu. Dairy Whole, low-fat, and nonfat milk. Whole, low-fat, and nonfat yogurt. Cottage cheese. Sour cream. Hard and soft cheeses. Beverages Water. Herbal tea. Decaffeinated coffee. Decaffeinated green tea. 100% fruit juice. 100% vegetable juice. Instant breakfast shakes. Condiments Ketchup. Mayonnaise. Mustard. Salad dressing. Barbecue  sauce. Sweets and Desserts Sugar-free ice cream. Sugar-free pudding. Sugar-free gelatin. Fats and Oils Butter. Vegetable oil, flaxseed oil, olive oil, and walnut oil. Other Complete nutrition shakes. Protein bars. Sugar-free gum. The items listed above may not be a complete list of recommended foods or beverages. Contact your dietitian for more options. WHAT FOODS ARE NOT RECOMMENDED? Grains Sugar-sweetened breakfast cereals. Flavored instant oatmeal. Fried breads. Vegetables Breaded or deep-fried vegetables. Fruits Dried fruit with added sugar. Candied fruit. Canned fruit in syrup. Meats and Other Protein Sources Breaded or deep-fried meats. Dairy Flavored milks. Fried cheese curds or fried cheese sticks. Beverages Alcohol. Sugar-sweetened soft drinks. Sugar-sweetened tea. Caffeinated coffee and tea. Condiments Sugar. Honey. Agave nectar. Molasses. Sweets and Desserts Chocolate. Cake. Cookies. Candy. Other Potato chips. Pretzels. Salted nuts. Candied nuts. The items listed above may not be a complete list of foods and beverages to avoid. Contact your dietitian for more information.   This information is not intended to replace advice given to you by your health care provider. Make sure you discuss any questions you have with your health care provider.   Document Released: 07/08/2005 Document Revised: 10/04/2014 Document Reviewed: 04/16/2014 Elsevier Interactive Patient Education [employer]

## 2016-01-18 NOTE — ED Notes (Signed)
Pt awaken and offered breakfast. Pt instructed on needing to work on getting d/c to home. Verbalized understanding.

## 2016-01-18 NOTE — ED Notes (Addendum)
Pt ambulated short distance in room with steady gait and no assist. Denies dizziness/lightheadedness. PA at bedside.

## 2016-01-18 NOTE — ED Notes (Signed)
Patient states he would like to leave. Attempted to reason with patient about being seen due to a fall. Patient is A&Ox4. Reports he did have ETOH tonight, and took his regular medications as prescribed. Attempted to ambulate patient, patient is unsteady without assistance. Patient was advised he needs to wait and sober up before being discharged. As this nurse exited patient room he stated "I'm leaving here soon." Will notify PA.

## 2016-09-10 ENCOUNTER — Encounter: Payer: Self-pay | Admitting: Family

## 2016-11-25 ENCOUNTER — Emergency Department (HOSPITAL_COMMUNITY)
Admission: EM | Admit: 2016-11-25 | Discharge: 2016-11-25 | Disposition: A | Discharge status: Home / Self Care | Payer: Medicare HMO | Attending: Emergency Medicine | Admitting: Emergency Medicine

## 2016-11-25 ENCOUNTER — Emergency Department (HOSPITAL_COMMUNITY): Payer: Medicare HMO

## 2016-11-25 ENCOUNTER — Encounter (HOSPITAL_COMMUNITY): Payer: Self-pay

## 2016-11-25 DIAGNOSIS — I10 Essential (primary) hypertension: Secondary | ICD-10-CM | POA: Insufficient documentation

## 2016-11-25 DIAGNOSIS — F17210 Nicotine dependence, cigarettes, uncomplicated: Secondary | ICD-10-CM | POA: Insufficient documentation

## 2016-11-25 DIAGNOSIS — M5442 Lumbago with sciatica, left side: Secondary | ICD-10-CM

## 2016-11-25 DIAGNOSIS — Z79899 Other long term (current) drug therapy: Secondary | ICD-10-CM | POA: Diagnosis not present

## 2016-11-25 DIAGNOSIS — M545 Low back pain: Secondary | ICD-10-CM | POA: Insufficient documentation

## 2016-11-25 DIAGNOSIS — M5441 Lumbago with sciatica, right side: Secondary | ICD-10-CM

## 2016-11-25 MED ORDER — KETOROLAC TROMETHAMINE 15 MG/ML IJ SOLN
15.0000 mg | Freq: Once | INTRAMUSCULAR | Status: AC
  Administered 2016-11-25: 15 mg via INTRAMUSCULAR
  Filled 2016-11-25: qty 1

## 2016-11-25 MED ORDER — LORAZEPAM 1 MG PO TABS: 1.0000 mg | ORAL_TABLET | Freq: Three times a day (TID) | ORAL | 0 refills | Status: AC

## 2016-11-25 MED ORDER — LORAZEPAM 2 MG/ML IJ SOLN
1.0000 mg | Freq: Once | INTRAMUSCULAR | Status: AC
Start: 2016-11-25 — End: 2016-11-25
  Administered 2016-11-25: 1 mg via INTRAMUSCULAR
  Filled 2016-11-25: qty 1

## 2016-11-25 MED ORDER — TRAMADOL HCL 50 MG PO TABS: 50.0000 mg | ORAL_TABLET | Freq: Four times a day (QID) | ORAL | 0 refills | Status: DC | PRN

## 2016-11-25 MED ORDER — PREDNISONE 20 MG PO TABS: 40.0000 mg | ORAL_TABLET | Freq: Every day | ORAL | 0 refills | Status: DC

## 2016-11-25 NOTE — ED Provider Notes (Signed)
[facility] DEPT Provider Note   CSN: [id number] Arrival date & time: 11/25/16  1136  By signing my name below, I, Cory Foster, attest that this documentation has been prepared under the direction and in the presence of Gerhard Munch, MD. Electronically Signed: Rosario Foster, ED Scribe. 11/25/16. 1:04 PM.  History   Chief Complaint Chief Complaint  Patient presents with  . Back Pain   The history is provided by the patient. No language interpreter was used.    HPI Comments: Cory Foster. is a 61 y.o. male with a h/o DDD, GERD, GAD, substance abuse, HTN/HLD, who presents to the Emergency Department complaining of persistent, acute on chronic, right-sided lower back pain beginning four days ago. He notes radiation of his pain into his right hip and down into his right leg. Pt reports that he was sitting down four days ago and at rest when his pain began and there have been no recent falls, trauma, or injury to the back to precipitate his pain. Pt has a h/o chronic back pain and his pain today is consistent with this; however, this is the worst pain he has ever experienced. He has been taking Naprosyn and applying ice to the area without significant relief of his pain. Pt's pain is exacerbated with ambulation and generalized movements. Pt has a PSHx to the back including six spinal disc replacements performed four years ago. He denies bowel/bladder incontinence, saddle anaesthesia/paraesthesias, distal numbness/weakness, fever, or any other associated symptoms.   Past Medical History:  Diagnosis Date  . Anxiety   . DDD (degenerative disc disease)   . Depression   . GERD (gastroesophageal reflux disease)   . Hyperlipidemia   . Hypertension   . Psoriasis   . Substance abuse   . Substance induced mood disorder (HCC)   . Varicose vein    Patient Active Problem List   Diagnosis Date Noted  . Hypertension   . Hyperlipidemia   . GERD (gastroesophageal reflux  disease)   . Depression   . Substance abuse   . Anxiety   . DDD (degenerative disc disease)   . Psoriasis   . Alcohol dependence (HCC) 08/08/2013  . MDD (major depressive disorder) 08/08/2013  . GAD (generalized anxiety disorder) 08/08/2013   Past Surgical History:  Procedure Laterality Date  . CHOLECYSTECTOMY    . SINUSOTOMY    . VEIN LIGATION AND STRIPPING      Home Medications    Prior to Admission medications   Medication Sig Start Date End Date Taking? Authorizing Provider  amoxicillin-clavulanate (AUGMENTIN) 875-125 MG tablet Take 1 tablet by mouth 2 (two) times daily. 01/12/16   Historical Provider, MD  atorvastatin (LIPITOR) 80 MG tablet Take 40 mg by mouth daily.    Historical Provider, MD  Cholecalciferol (VITAMIN D-3) 5000 UNITS TABS Take 5,000 Units by mouth daily.     Historical Provider, MD  clonazePAM (KLONOPIN) 2 MG tablet Take 2 mg by mouth 3 (three) times daily as needed for anxiety.    Historical Provider, MD  Magnesium 250 MG TABS Take 250 mg by mouth daily.     Historical Provider, MD  metFORMIN (GLUCOPHAGE) 500 MG tablet Take 500 mg by mouth 2 (two) times daily with a meal.    Historical Provider, MD  Multiple Vitamin (MULTIVITAMIN WITH MINERALS) TABS tablet Take 1 tablet by mouth every morning.    Historical Provider, MD  sertraline (ZOLOFT) 100 MG tablet Take 1 tablet (100 mg total) by mouth 2 (  two) times daily. For depression/anxiety 08/15/13   Rachael Fee, MD   Family History Family History  Problem Relation Age of Onset  . Depression Mother   . Anxiety disorder Mother   . Cancer Father    Social History Social History  Substance Use Topics  . Smoking status: Current Every Day Smoker    Packs/day: 0.50    Types: Cigarettes  . Smokeless tobacco: Never Used  . Alcohol use Yes     Comment: couple of beers   Allergies   Ceclor [cefaclor]; Ciprofloxacin; Citalopram; and Seroquel [quetiapine fumarate]  Review of Systems Review of Systems    Constitutional: Negative for fever.  Musculoskeletal: Positive for arthralgias, back pain and myalgias.  Neurological: Negative for weakness and numbness.       Negative for bowel/bladder incontinence. Negative for saddle anaesthesia/paraesthesias.   All other systems reviewed and are negative.  Physical Exam Updated Vital Signs BP (!) 143/102 (BP Location: Left Arm)   Pulse 94   Temp 97.7 F (36.5 C) (Oral)   Resp 18   SpO2 99%   Physical Exam  Constitutional: He appears well-developed and well-nourished. No distress.  HENT:  Head: Normocephalic and atraumatic.  Eyes: Conjunctivae are normal.  Neck: Normal range of motion.  Cardiovascular: Normal rate, regular rhythm and normal heart sounds.   No murmur heard. Pulmonary/Chest: Effort normal and breath sounds normal. No respiratory distress. He has no wheezes. He has no rales.  Abdominal: He exhibits no distension.  Musculoskeletal: Normal range of motion.  Midline surgical scar throughout the lumbar region.    Neurological: He is alert.  5/5 strength to BLE proximally and distally. Sensation and pulses are symmetric and appropriate to the bilateral lower extremities.    Skin: No pallor.  Psychiatric: He has a normal mood and affect. His behavior is normal.  Nursing note and vitals reviewed.  ED Treatments / Results  DIAGNOSTIC STUDIES: Oxygen Saturation is 99% on RA, normal by my interpretation.   COORDINATION OF CARE: 1:04 PM-Discussed next steps with pt. Pt verbalized understanding and is agreeable with the plan.  Radiology Dg Lumbar Spine Complete  Result Date: 11/25/2016 CLINICAL DATA:  Severe low back pain radiating to both legs since Monday EXAM: LUMBAR SPINE - COMPLETE 4+ VIEW COMPARISON:  CT 10/17/2013 FINDINGS: Prior posterior fusion at L4-5. Normal alignment. No fracture. SI joints are symmetric and unremarkable. Mild disc space narrowing at L5-S1. Degenerative facet disease at L3-4 and L5-S1. IMPRESSION: Mild  degenerative changes. Postoperative changes as above. No acute findings. Electronically Signed   By: Charlett Nose M.D.   On: 11/25/2016 14:08    Procedures Procedures   Medications Ordered in ED Medications  ketorolac (TORADOL) 15 MG/ML injection 15 mg (15 mg Intramuscular Given 11/25/16 1333)  LORazepam (ATIVAN) injection 1 mg (1 mg Intramuscular Given 11/25/16 1333)   2:31 PM Patient appears substantially more comfortable. I discussed all findings with patient and his sister. With reassuring physical exam, improvement in pain, patient will be discharged to follow-up with orthopedics for consideration of Physical therapy, and appropriate ongoing care. Patient requests back brace on discharge. With no neurologic complaints, no red flags, improvement in his condition, patient appropriate for further evaluation, management as an outpatient.   Initial Impression / Assessment and Plan / ED Course  I have reviewed the triage vital signs and the nursing notes.  Pertinent labs & imaging results that were available during my care of the patient were reviewed by me and considered in  my medical decision making (see chart for details).   Final Clinical Impressions(s) / ED Diagnoses   Final diagnoses:  Bilateral low back pain with bilateral sciatica, unspecified chronicity   I personally performed the services described in this documentation, which was scribed in my presence. The recorded information has been reviewed and is accurate.       Gerhard Munchobert Nataliyah Packham, MD 11/25/16 [phone]

## 2016-11-25 NOTE — ED Triage Notes (Signed)
Pt reports hx of lower back surgery. He reports he now has excruciating pain in his lower back that causes him to feel sick on his stomach. Pt reports standing/ambulating hurts.

## 2016-11-25 NOTE — Discharge Instructions (Signed)
As discussed, with your ongoing back pain it is important to take all medication as directed, and be sure to follow-up with our orthopedic specialists.  Return here for concerning changes in your condition.

## 2016-11-25 NOTE — Progress Notes (Signed)
Orthopedic Tech Progress Note Patient Details:  Cory MoralesRandall M Rolfson Jr. 1956/03/13 [id number]  Ortho Devices Type of Ortho Device: Abdominal binder Ortho Device/Splint Interventions: Application   Saul FordyceJennifer C Kaiyan Luczak 11/25/2016, 3:07 PM

## 2017-03-06 ENCOUNTER — Emergency Department
Admission: EM | Admit: 2017-03-06 | Discharge: 2017-03-06 | Disposition: A | Discharge status: Home / Self Care | Payer: Medicare Other | Attending: Emergency Medicine | Admitting: Emergency Medicine

## 2017-03-06 ENCOUNTER — Encounter: Payer: Self-pay | Admitting: Emergency Medicine

## 2017-03-06 DIAGNOSIS — I10 Essential (primary) hypertension: Secondary | ICD-10-CM | POA: Diagnosis not present

## 2017-03-06 DIAGNOSIS — F17210 Nicotine dependence, cigarettes, uncomplicated: Secondary | ICD-10-CM | POA: Diagnosis not present

## 2017-03-06 DIAGNOSIS — F419 Anxiety disorder, unspecified: Secondary | ICD-10-CM

## 2017-03-06 DIAGNOSIS — Z7984 Long term (current) use of oral hypoglycemic drugs: Secondary | ICD-10-CM | POA: Insufficient documentation

## 2017-03-06 LAB — COMPREHENSIVE METABOLIC PANEL
ALBUMIN: 3.6 g/dL (ref 3.5–5.0)
ALK PHOS: 89 U/L (ref 38–126)
ALT: 85 U/L — AB (ref 17–63)
AST: 100 U/L — AB (ref 15–41)
Anion gap: 15 (ref 5–15)
BILIRUBIN TOTAL: 1.5 mg/dL — AB (ref 0.3–1.2)
CO2: 25 mmol/L (ref 22–32)
CREATININE: 1.12 mg/dL (ref 0.61–1.24)
Calcium: 9 mg/dL (ref 8.9–10.3)
Chloride: 96 mmol/L — ABNORMAL LOW (ref 101–111)
GFR calc Af Amer: >60 mL/min (ref >60)
GFR calc non Af Amer: >60 mL/min (ref >60)
GLUCOSE: 246 mg/dL — AB (ref 65–99)
POTASSIUM: 3.3 mmol/L — AB (ref 3.5–5.1)
Sodium: 136 mmol/L (ref 135–145)
TOTAL PROTEIN: 7.6 g/dL (ref 6.5–8.1)

## 2017-03-06 LAB — CBC
HEMATOCRIT: 44.7 % (ref 40.0–52.0)
Hemoglobin: 15.6 g/dL (ref 13.0–18.0)
MCH: 36.4 pg — AB (ref 26.0–34.0)
MCHC: 34.9 g/dL (ref 32.0–36.0)
MCV: 104.5 fL — AB (ref 80.0–100.0)
Platelets: 169 10*3/uL (ref 150–440)
RBC: 4.28 MIL/uL — ABNORMAL LOW (ref 4.40–5.90)
RDW: 14.8 % — AB (ref 11.5–14.5)
WBC: 7.8 10*3/uL (ref 3.8–10.6)

## 2017-03-06 LAB — ETHANOL: Alcohol, Ethyl (B): 19 mg/dL — ABNORMAL HIGH (ref <5)

## 2017-03-06 LAB — SALICYLATE LEVEL: Salicylate Lvl: <7 mg/dL (ref 2.8–30.0)

## 2017-03-06 LAB — ACETAMINOPHEN LEVEL: Acetaminophen (Tylenol), Serum: <10 ug/mL — ABNORMAL LOW (ref 10–30)

## 2017-03-06 MED ORDER — CLONAZEPAM 0.5 MG PO TABS
2.0000 mg | ORAL_TABLET | Freq: Once | ORAL | Status: AC
  Administered 2017-03-06: 2 mg via ORAL
  Filled 2017-03-06: qty 4

## 2017-03-06 MED ORDER — CLONAZEPAM 2 MG PO TABS: 2.0000 mg | ORAL_TABLET | Freq: Two times a day (BID) | ORAL | 0 refills | Status: DC
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
## 2017-03-06 NOTE — Discharge Instructions (Signed)
Please make sure you go to your primary care physician tomorrow for recheck and a possible refill of your Klonopin. In the emergency department we do not refill lost prescriptions.  Return to the emergency department for any concerns.  It was a pleasure to take care of you today, and thank you for coming to our emergency department.  If you have any questions or concerns before leaving please ask the nurse to grab me and I'm more than happy to go through your aftercare instructions again.  If you were prescribed any opioid pain medication today such as Norco, Vicodin, Percocet, morphine, hydrocodone, or oxycodone please make sure you do not drive when you are taking this medication as it can alter your ability to drive safely.  If you have any concerns once you are home that you are not improving or are in fact getting worse before you can make it to your follow-up appointment, please do not hesitate to call 911 and come back for further evaluation.  Merrily BrittleNeil Gurtaj Ruz MD  Results for orders placed or performed during the hospital encounter of 03/06/17  cbc  Result Value Ref Range   WBC 7.8 3.8 - 10.6 K/uL   RBC 4.28 (L) 4.40 - 5.90 MIL/uL   Hemoglobin 15.6 13.0 - 18.0 g/dL   HCT 09.844.7 11.940.0 - 14.752.0 %   MCV 104.5 (H) 80.0 - 100.0 fL   MCH 36.4 (H) 26.0 - 34.0 pg   MCHC 34.9 32.0 - 36.0 g/dL   RDW 82.914.8 (H) 56.211.5 - 13.014.5 %   Platelets 169 150 - 440 K/uL
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
## 2017-12-14 ENCOUNTER — Encounter: Payer: Self-pay | Admitting: Emergency Medicine

## 2017-12-14 ENCOUNTER — Emergency Department
Admission: EM | Admit: 2017-12-14 | Discharge: 2017-12-15 | Disposition: A | Discharge status: Other Acute Inpatient Hospital | Payer: Medicare Other | Attending: Emergency Medicine | Admitting: Emergency Medicine

## 2017-12-14 DIAGNOSIS — E119 Type 2 diabetes mellitus without complications: Secondary | ICD-10-CM

## 2017-12-14 DIAGNOSIS — R4589 Other symptoms and signs involving emotional state: Secondary | ICD-10-CM | POA: Insufficient documentation

## 2017-12-14 DIAGNOSIS — Z79899 Other long term (current) drug therapy: Secondary | ICD-10-CM | POA: Diagnosis not present

## 2017-12-14 DIAGNOSIS — F17210 Nicotine dependence, cigarettes, uncomplicated: Secondary | ICD-10-CM | POA: Diagnosis not present

## 2017-12-14 DIAGNOSIS — I10 Essential (primary) hypertension: Secondary | ICD-10-CM | POA: Insufficient documentation

## 2017-12-14 DIAGNOSIS — F1020 Alcohol dependence, uncomplicated: Secondary | ICD-10-CM | POA: Diagnosis not present

## 2017-12-14 DIAGNOSIS — R45851 Suicidal ideations: Secondary | ICD-10-CM

## 2017-12-14 DIAGNOSIS — E785 Hyperlipidemia, unspecified: Secondary | ICD-10-CM | POA: Diagnosis present

## 2017-12-14 DIAGNOSIS — F332 Major depressive disorder, recurrent severe without psychotic features: Secondary | ICD-10-CM

## 2017-12-14 DIAGNOSIS — F32A Depression, unspecified: Secondary | ICD-10-CM

## 2017-12-14 DIAGNOSIS — F329 Major depressive disorder, single episode, unspecified: Secondary | ICD-10-CM | POA: Insufficient documentation

## 2017-12-14 LAB — COMPREHENSIVE METABOLIC PANEL
ALBUMIN: 3.5 g/dL (ref 3.5–5.0)
ALT: 67 U/L — AB (ref 17–63)
AST: 108 U/L — AB (ref 15–41)
Alkaline Phosphatase: 82 U/L (ref 38–126)
Anion gap: 10 (ref 5–15)
BUN: 9 mg/dL (ref 6–20)
CHLORIDE: 97 mmol/L — AB (ref 101–111)
CO2: 27 mmol/L (ref 22–32)
CREATININE: 1.03 mg/dL (ref 0.61–1.24)
Calcium: 8.3 mg/dL — ABNORMAL LOW (ref 8.9–10.3)
GFR calc Af Amer: >60 mL/min (ref >60)
GLUCOSE: 189 mg/dL — AB (ref 65–99)
Potassium: 3.4 mmol/L — ABNORMAL LOW (ref 3.5–5.1)
SODIUM: 134 mmol/L — AB (ref 135–145)
Total Bilirubin: 2.3 mg/dL — ABNORMAL HIGH (ref 0.3–1.2)
Total Protein: 7.7 g/dL (ref 6.5–8.1)

## 2017-12-14 LAB — ETHANOL

## 2017-12-14 LAB — CBC
HEMATOCRIT: 41.5 % (ref 40.0–52.0)
Hemoglobin: 14.5 g/dL (ref 13.0–18.0)
MCH: 37.9 pg — ABNORMAL HIGH (ref 26.0–34.0)
MCHC: 35 g/dL (ref 32.0–36.0)
MCV: 108.2 fL — AB (ref 80.0–100.0)
Platelets: 177 10*3/uL (ref 150–440)
RBC: 3.83 MIL/uL — ABNORMAL LOW (ref 4.40–5.90)
RDW: 22.9 % — AB (ref 11.5–14.5)
WBC: 7.1 10*3/uL (ref 3.8–10.6)

## 2017-12-14 LAB — GLUCOSE, CAPILLARY
GLUCOSE-CAPILLARY: 188 mg/dL — AB (ref 65–99)
Glucose-Capillary: 131 mg/dL — ABNORMAL HIGH (ref 65–99)

## 2017-12-14 LAB — URINE DRUG SCREEN, QUALITATIVE (ARMC ONLY)
AMPHETAMINES, UR SCREEN: NOT DETECTED
BARBITURATES, UR SCREEN: NOT DETECTED
Benzodiazepine, Ur Scrn: POSITIVE — AB
COCAINE METABOLITE, UR ~~LOC~~: NOT DETECTED
Cannabinoid 50 Ng, Ur ~~LOC~~: NOT DETECTED
MDMA (Ecstasy)Ur Screen: NOT DETECTED
METHADONE SCREEN, URINE: NOT DETECTED
OPIATE, UR SCREEN: NOT DETECTED
Phencyclidine (PCP) Ur S: NOT DETECTED
Tricyclic, Ur Screen: NOT DETECTED

## 2017-12-14 LAB — ACETAMINOPHEN LEVEL: Acetaminophen (Tylenol), Serum: <10 ug/mL — ABNORMAL LOW (ref 10–30)

## 2017-12-14 LAB — SALICYLATE LEVEL: Salicylate Lvl: <7 mg/dL (ref 2.8–30.0)

## 2017-12-14 MED ORDER — LINAGLIPTIN 5 MG PO TABS
5.0000 mg | ORAL_TABLET | Freq: Every day | ORAL | Status: DC
  Administered 2017-12-14 – 2017-12-15 (×2): 5 mg via ORAL
  Filled 2017-12-14 (×3): qty 1

## 2017-12-14 MED ORDER — LORAZEPAM 1 MG PO TABS
1.0000 mg | ORAL_TABLET | Freq: Four times a day (QID) | ORAL | Status: DC | PRN
  Filled 2017-12-14: qty 1

## 2017-12-14 MED ORDER — CHLORDIAZEPOXIDE HCL 25 MG PO CAPS
25.0000 mg | ORAL_CAPSULE | Freq: Once | ORAL | Status: AC
  Administered 2017-12-14 (×2): 25 mg via ORAL
  Filled 2017-12-14: qty 1

## 2017-12-14 MED ORDER — INSULIN GLARGINE 100 UNIT/ML ~~LOC~~ SOLN
16.0000 [IU] | Freq: Every day | SUBCUTANEOUS | Status: DC
  Administered 2017-12-14: 16 [IU] via SUBCUTANEOUS
  Filled 2017-12-14 (×2): qty 0.16

## 2017-12-14 MED ORDER — LORAZEPAM 2 MG PO TABS
0.0000 mg | ORAL_TABLET | Freq: Four times a day (QID) | ORAL | Status: DC
  Administered 2017-12-14: 2 mg via ORAL
  Filled 2017-12-14: qty 1

## 2017-12-14 MED ORDER — LORAZEPAM 2 MG/ML IJ SOLN: 0.0000 mg | Freq: Two times a day (BID) | INTRAMUSCULAR | Status: DC

## 2017-12-14 MED ORDER — ATORVASTATIN CALCIUM 20 MG PO TABS
80.0000 mg | ORAL_TABLET | Freq: Every day | ORAL | Status: DC
  Administered 2017-12-14: 80 mg via ORAL
  Filled 2017-12-14: qty 4

## 2017-12-14 MED ORDER — LORAZEPAM 2 MG/ML IJ SOLN: 1.0000 mg | Freq: Four times a day (QID) | INTRAMUSCULAR | Status: DC | PRN

## 2017-12-14 MED ORDER — THIAMINE HCL 100 MG/ML IJ SOLN: 100.0000 mg | Freq: Every day | INTRAMUSCULAR | Status: DC

## 2017-12-14 MED ORDER — FOLIC ACID 1 MG PO TABS
1.0000 mg | ORAL_TABLET | Freq: Every day | ORAL | Status: DC
  Administered 2017-12-14 – 2017-12-15 (×2): 1 mg via ORAL
  Filled 2017-12-14 (×2): qty 1

## 2017-12-14 MED ORDER — METFORMIN HCL 500 MG PO TABS
1000.0000 mg | ORAL_TABLET | Freq: Two times a day (BID) | ORAL | Status: DC
  Administered 2017-12-14 – 2017-12-15 (×2): 1000 mg via ORAL
  Filled 2017-12-14 (×2): qty 2

## 2017-12-14 MED ORDER — ONDANSETRON 4 MG PO TBDP
8.0000 mg | ORAL_TABLET | Freq: Once | ORAL | Status: AC
  Administered 2017-12-14 (×2): 8 mg via ORAL

## 2017-12-14 MED ORDER — ADULT MULTIVITAMIN W/MINERALS CH
1.0000 | ORAL_TABLET | Freq: Every day | ORAL | Status: DC
  Administered 2017-12-14 – 2017-12-15 (×2): 1 via ORAL
  Filled 2017-12-14 (×2): qty 1

## 2017-12-14 MED ORDER — ONDANSETRON 4 MG PO TBDP
ORAL_TABLET | ORAL | Status: AC
  Filled 2017-12-14: qty 1

## 2017-12-14 MED ORDER — CHLORDIAZEPOXIDE HCL 25 MG PO CAPS
ORAL_CAPSULE | ORAL | Status: AC
  Administered 2017-12-14: 25 mg via ORAL
  Filled 2017-12-14: qty 1

## 2017-12-14 MED ORDER — ONDANSETRON 4 MG PO TBDP
ORAL_TABLET | ORAL | Status: AC
  Administered 2017-12-14: 8 mg via ORAL
  Filled 2017-12-14: qty 1

## 2017-12-14 MED ORDER — VITAMIN B-1 100 MG PO TABS
100.0000 mg | ORAL_TABLET | Freq: Every day | ORAL | Status: DC
  Administered 2017-12-15: 100 mg via ORAL
  Filled 2017-12-14: qty 1

## 2017-12-14 MED ORDER — SERTRALINE HCL 100 MG PO TABS
100.0000 mg | ORAL_TABLET | Freq: Two times a day (BID) | ORAL | Status: DC
  Administered 2017-12-14 – 2017-12-15 (×2): 100 mg via ORAL
  Filled 2017-12-14 (×2): qty 1

## 2017-12-14 MED ORDER — CHLORDIAZEPOXIDE HCL 25 MG PO CAPS
25.0000 mg | ORAL_CAPSULE | Freq: Once | ORAL | Status: AC
  Administered 2017-12-14: 25 mg via ORAL

## 2017-12-14 MED ORDER — VITAMIN B-1 100 MG PO TABS
100.0000 mg | ORAL_TABLET | Freq: Every day | ORAL | Status: DC
  Administered 2017-12-14: 100 mg via ORAL
  Filled 2017-12-14: qty 1

## 2017-12-14 MED ORDER — CLONAZEPAM 1 MG PO TABS
2.0000 mg | ORAL_TABLET | Freq: Two times a day (BID) | ORAL | Status: DC
  Administered 2017-12-14 – 2017-12-15 (×2): 2 mg via ORAL
  Filled 2017-12-14 (×2): qty 2

## 2017-12-14 MED ORDER — LORAZEPAM 2 MG/ML IJ SOLN: 0.0000 mg | Freq: Four times a day (QID) | INTRAMUSCULAR | Status: DC

## 2017-12-14 MED ORDER — LORAZEPAM 2 MG PO TABS: 0.0000 mg | ORAL_TABLET | Freq: Two times a day (BID) | ORAL | Status: DC

## 2017-12-14 NOTE — ED Notes (Signed)

## 2017-12-14 NOTE — ED Notes (Signed)
Patient is wanting to leave because he is cold despite this Clinical research associatewriter turning up Thermostat and giving extra blanket. Patient states, "I have 6 bad disc in my back and the bed hurts my back. I just want to leave." This Clinical research associatewriter has Community education officernotified Charge RN.

## 2017-12-14 NOTE — ED Provider Notes (Signed)
-----------------------------------------   9:56 PM on 12/14/2017 -----------------------------------------  I was called to the behavioral health unit because of concerns that the patient was requesting to leave.  Per Dr. Toni Amendlapacs note there was very significant so concerns for suicidal ideation, intent and ability to carry out the plan.  When I tried to talk to the patient about his suicidal ideation he would deflect did not answer my questions directly.  At this point given psychiatry is finding it his own unwillingness states my questions I have placed patient under IVC.  Do not feel comfortable letting him leave our facility until he is stabilized.    Phineas SemenGoodman, Lennex Pietila, MD 12/14/17 2157

## 2017-12-14 NOTE — ED Notes (Signed)
BEHAVIORAL HEALTH ROUNDING Patient sleeping: No. Patient alert and oriented: yes Behavior appropriate: Yes.  ; If no, describe:  Nutrition and fluids offered: yes Toileting and hygiene offered: Yes  Sitter present: q15 minute observations and security  monitoring Law enforcement present: Yes  ODS  

## 2017-12-14 NOTE — BH Assessment (Signed)
Patient's referral information faxed to:   [facility]    [address] [place] [address] Phone: [phone] Fax: [phone] [facility]    [address], [place] [address] Phone: [phone] Fax: [phone] [facility]    [address] [place] [address] Phone: [phone] Fax: [phone] [facility]   [address] [place] [address] Phone: [phone] Fax: [phone] [facility]    [address]  [place] [address] Phone: [phone] Fax: [phone] [facility]   [address] [place] [address] Phone: [phone] Fax: [phone] [facility]   [address] [place] [address] Phone: [phone] Fax: [phone] [facility]   [address] [place] [address] Phone: [phone] Fax: [phone] [facility]    [address], [place] [address] Phone: [phone] Fax: [phone] [facility]    [address] [place] [address] Phone: [phone] Fax: [phone] [facility]    [address] [place] [address] Phone: [phone] Fax: [phone] [facility]   [address] [place] [address] Phone: [phone] Fax: [phone] [facility]    [address] [place] [address] Phone: [phone] Fax: [phone] [facility]   [address] [place]  [address] Phone: [phone] Fax: [phone] 

## 2017-12-14 NOTE — BH Assessment (Addendum)
Assessment Note  Cory Foster. is an 62 y.o. male. Patient presented to [facility] voluntarily due to depression, SI, and alcohol detox. Patient endorsed depression for the past few years and tearfully stated "I don't care about anything anymore." Furthermore, patient endorsed daily alcohol use characterized by drinking a 12 pack of beer daily.  Patient didn't elaborate on his triggers for SI and substance use during the assessment. Patient stated he has been previously diagnosed with depression and anxiety. Patient endorsed SI with a plan to shoot himself with a gun or overdose on Klonopin. Patient denied HI and AVH. Patient denied having any outpatient mental health providers. Patient reported he has been hospitalized SI and alcohol detox at [facility] previously.   Diagnosis: Depression  Past Medical History:  Past Medical History:  Diagnosis Date  . Anxiety   . DDD (degenerative disc disease)   . Depression   . GERD (gastroesophageal reflux disease)   . Hyperlipidemia   . Hypertension   . Psoriasis   . Substance abuse (HCC)   . Substance induced mood disorder (HCC)   . Varicose vein     Past Surgical History:  Procedure Laterality Date  . CHOLECYSTECTOMY    . SINUSOTOMY    . VEIN LIGATION AND STRIPPING      Family History:  Family History  Problem Relation Age of Onset  . Depression Mother   . Anxiety disorder Mother   . Cancer Father     Social History:  reports that he has been smoking cigarettes.  He has been smoking about 0.50 packs per day. he has never used smokeless tobacco. He reports that he drinks alcohol. He reports that he does not use drugs.  Additional Social History:  Alcohol / Drug Use Pain Medications: SEE PTA  Prescriptions: SEE PTA  Over the Counter: SEE PTA  History of alcohol / drug use?: Yes Longest period of sobriety (when/how long): 1 year  Negative Consequences of Use: Financial, Personal relationships Withdrawal Symptoms:  Tremors Substance #1 Name of Substance 1: Alcohol  1 - Age of First Use: 61 years old  1 - Amount (size/oz): 12 pack of beer 1 - Frequency: daily  1 - Duration: "years"  1 - Last Use / Amount: today   CIWA: CIWA-Ar BP: 139/77 Pulse Rate: 98 Nausea and Vomiting: no nausea and no vomiting Tactile Disturbances: none Tremor: no tremor Auditory Disturbances: not present Paroxysmal Sweats: barely perceptible sweating, palms moist Visual Disturbances: not present Anxiety: moderately anxious, or guarded, so anxiety is inferred Headache, Fullness in Head: mild Agitation: normal activity Orientation and Clouding of Sensorium: oriented and can do serial additions CIWA-Ar Total: 7 COWS:    Allergies:  Allergies  Allergen Reactions  . Ceclor [Cefaclor]     unknown  . Ciprofloxacin     unknown  . Citalopram     Hyper   . Seroquel [Quetiapine Fumarate]     "hung over" feeling    Home Medications:  (Not in a hospital admission)  OB/GYN Status:  No LMP for male patient.  General Assessment Data Assessment unable to be completed: (Assessment Completed ) Location of Assessment: [facility] ED TTS Assessment: In system Is this a Tele or Face-to-Face Assessment?: Face-to-Face Is this an Initial Assessment or a Re-assessment for this encounter?: Initial Assessment Marital status: Divorced [place] name: N/A Is patient pregnant?: No Pregnancy Status: No Living Arrangements: Other relatives(Sister- Pura Spice [phone])) Can pt return to current living arrangement?: Yes Admission Status: Voluntary Is patient  capable of signing voluntary admission?: Yes Referral Source: Self/Family/Friend Insurance type: [place] [employer] Medicare  Medical Screening Exam [facility] Walk-in ONLY) Medical Exam completed: Yes  Crisis Care Plan Living Arrangements: Other relatives(Sister- Pura Spice [phone])) Legal Guardian: Other:(None reported ) Name of Psychiatrist: None reported  Name of Therapist:  None reported   Education Status Is patient currently in school?: No Is the patient employed, unemployed or receiving disability?: Receiving disability income  Risk to self with the past 6 months Suicidal Ideation: Yes-Currently Present Has patient been a risk to self within the past 6 months prior to admission? : Yes Suicidal Intent: Yes-Currently Present Has patient had any suicidal intent within the past 6 months prior to admission? : Yes Is patient at risk for suicide?: Yes Suicidal Plan?: Yes-Currently Present Has patient had any suicidal plan within the past 6 months prior to admission? : Yes Specify Current Suicidal Plan: overdose on Klonopin Access to Means: Yes Specify Access to Suicidal Means: guns at sons house What has been your use of drugs/alcohol within the last 12 months?: alcohol use daily  Previous Attempts/Gestures: No How many times?: 0 Other Self Harm Risks: None reported  Triggers for Past Attempts: Unpredictable Intentional Self Injurious Behavior: None Family Suicide History: Yes Recent stressful life event(s): Other (Comment)(Unknown) Persecutory voices/beliefs?: No Depression: Yes Depression Symptoms: Feeling worthless/self pity, Isolating, Tearfulness, Insomnia Substance abuse history and/or treatment for substance abuse?: Yes Suicide prevention information given to non-admitted patients: Not applicable  Risk to Others within the past 6 months Homicidal Ideation: No Does patient have any lifetime risk of violence toward others beyond the six months prior to admission? : No Thoughts of Harm to Others: No Current Homicidal Intent: No Current Homicidal Plan: No Access to Homicidal Means: No Identified Victim: None reported  History of harm to others?: No Assessment of Violence: None Noted Violent Behavior Description: None reported  Does patient have access to weapons?: Yes (Comment) Criminal Charges Pending?: No Does patient have a court date:  No Is patient on probation?: No  Psychosis Hallucinations: None noted Delusions: None noted  Mental Status Report Appearance/Hygiene: In scrubs, Poor hygiene, Disheveled Eye Contact: Fair Motor Activity: Tremors Speech: Soft, Pressured Level of Consciousness: Alert Mood: Depressed Affect: Depressed Anxiety Level: None Thought Processes: Thought Blocking Judgement: Impaired Orientation: Person, Place, Appropriate for developmental age, Time, Situation Obsessive Compulsive Thoughts/Behaviors: None  Cognitive Functioning Concentration: Fair Memory: Recent Intact, Remote Intact Is patient IDD: No Is patient DD?: No Insight: Poor Impulse Control: Poor Appetite: Poor Have you had any weight changes? : No Change Sleep: Decreased Total Hours of Sleep: 0 Vegetative Symptoms: Staying in bed  ADLScreening [facility] Assessment Services) Patient's cognitive ability adequate to safely complete daily activities?: Yes  Prior Inpatient Therapy Prior Inpatient Therapy: Yes Prior Therapy Dates: Unknown [facility] Reason for Treatment: Depression  Prior Outpatient Therapy Prior Outpatient Therapy: No Does patient have an ACCT team?: No Does patient have Intensive In-House Services?  : No Does patient have [facility] services? : No Does patient have P4CC services?: No  ADL Screening (condition at time of admission) Patient's cognitive ability adequate to safely complete daily activities?: Yes Is the patient deaf or have difficulty hearing?: No Does the patient have difficulty seeing, even when wearing glasses/contacts?: No Does the patient have difficulty concentrating, remembering, or making decisions?: No Does the patient have difficulty walking or climbing stairs?: No Weakness of Legs: None Weakness of Arms/Hands: None  Home Assistive Devices/Equipment Home Assistive Devices/Equipment:  None  Therapy Consults (therapy consults require a  physician order) PT Evaluation Needed: No OT Evalulation Needed: No SLP Evaluation Needed: No Abuse/Neglect Assessment (Assessment to be complete while patient is alone) Abuse/Neglect Assessment Can Be Completed: Yes Physical Abuse: Denies Verbal Abuse: Denies Sexual Abuse: Denies Exploitation of patient/patient's resources: Denies Self-Neglect: Denies Possible abuse reported to:: Other (Comment) Values / Beliefs Cultural Requests During Hospitalization: None Spiritual Requests During Hospitalization: None Consults Spiritual Care Consult Needed: No Social Work Consult Needed: No      Additional Information 1:1 In Past 12 Months?: No CIRT Risk: No Elopement Risk: No Does patient have medical clearance?: No     Disposition:  Disposition Initial Assessment Completed for this Encounter: Yes  On Site Evaluation by:   Reviewed with Physician:    Keishana Klinger L Hayden Mabin, LPCA, LCASA 12/14/2017 12:06 PM

## 2017-12-14 NOTE — ED Notes (Addendum)
Pt dressed out into appropriate behavioral health clothing. Pt belongings consist of a black shirt, burgundy,black pants, brown shoes, blue jacket, black cell phone, a wallet with a $1 bill and a credit card and gray boxers. Pt dressed out with this tech, Tiffany,RN and Brett,NT in the rm. Pt calm and cooperative while dressing out. Pt bag labled. Pt has one bag of belongings.

## 2017-12-14 NOTE — ED Notes (Signed)
EDP in with patient 

## 2017-12-14 NOTE — ED Provider Notes (Addendum)
[facility] Emergency Department Provider Note  ____________________________________________  Time seen: Approximately 12:12 PM  I have reviewed the triage vital signs and the nursing notes.   HISTORY  Chief Complaint Alcohol Problem; Depression; and Suicidal    HPI Cory Foster. is a 62 y.o. male who complains of feeling depressed anxious and hopeless due to his alcohol abuse and additionally being very worried because his son is abusing heroin and other drugs. Patient has suicidal ideation but no specific plan, has not performed any self-injurious acts so far. He is a daily drinker, drinks about 12 beers a day, last week was yesterday. He needs an eye opener. He feels anxious, denies seizure or hallucinations. Denies homicidal ideation. Wants help. Symptoms are constant, gradually worsening without aggravating or alleviating factors at present time.     Past Medical History:  Diagnosis Date  . Anxiety   . DDD (degenerative disc disease)   . Depression   . GERD (gastroesophageal reflux disease)   . Hyperlipidemia   . Hypertension   . Psoriasis   . Substance abuse (HCC)   . Substance induced mood disorder (HCC)   . Varicose vein      Patient Active Problem List   Diagnosis Date Noted  . Hypertension   . Hyperlipidemia   . GERD (gastroesophageal reflux disease)   . Depression   . Substance abuse (HCC)   . Anxiety   . DDD (degenerative disc disease)   . Psoriasis   . Alcohol dependence (HCC) 08/08/2013  . MDD (major depressive disorder) 08/08/2013  . GAD (generalized anxiety disorder) 08/08/2013     Past Surgical History:  Procedure Laterality Date  . CHOLECYSTECTOMY    . SINUSOTOMY    . VEIN LIGATION AND STRIPPING       Prior to Admission medications   Medication Sig Start Date End Date Taking? Authorizing Provider  amoxicillin-clavulanate (AUGMENTIN) 875-125 MG tablet Take 1 tablet by mouth 2 (two) times daily. 01/12/16    [provider]  atorvastatin (LIPITOR) 80 MG tablet Take 40 mg by mouth daily.    [provider]  Cholecalciferol (VITAMIN D-3) 5000 UNITS TABS Take 5,000 Units by mouth daily.     [provider]  clonazePAM (KLONOPIN) 2 MG tablet Take 1 tablet (2 mg total) by mouth 2 (two) times daily. 03/06/17 03/08/17  Merrily Brittle, MD  Magnesium 250 MG TABS Take 250 mg by mouth daily.     [provider]  metFORMIN (GLUCOPHAGE) 500 MG tablet Take 500 mg by mouth 2 (two) times daily with a meal.    [provider]  Multiple Vitamin (MULTIVITAMIN WITH MINERALS) TABS tablet Take 1 tablet by mouth every morning.    [provider]  predniSONE (DELTASONE) 20 MG tablet Take 2 tablets (40 mg total) by mouth daily with breakfast. For the next four days 11/25/16   Gerhard Munch, MD  sertraline (ZOLOFT) 100 MG tablet Take 1 tablet (100 mg total) by mouth 2 (two) times daily. For depression/anxiety 08/15/13   Rachael Fee, MD  traMADol (ULTRAM) 50 MG tablet Take 1 tablet (50 mg total) by mouth every 6 (six) hours as needed. 11/25/16   Gerhard Munch, MD     Allergies Ceclor [cefaclor]; Ciprofloxacin; Citalopram; and Seroquel [quetiapine fumarate]   Family History  Problem Relation Age of Onset  . Depression Mother   . Anxiety disorder Mother   . Cancer Father     Social History Social History  Tobacco Use  . Smoking status: Current Every Day Smoker    Packs/day: 0.50    Types: Cigarettes  . Smokeless tobacco: Never Used  Substance Use Topics  . Alcohol use: Yes    Comment: couple of beers  . Drug use: No    Review of Systems  Constitutional:   No fever or chills.   Cardiovascular:   No chest pain or syncope. Respiratory:   No dyspnea or cough. Gastrointestinal:   positive for generalized abdominal pain without vomiting constipation or diarrhea.  Musculoskeletal:   Negative for focal pain or swelling All other systems reviewed and are  negative except as documented above in ROS and HPI.  ____________________________________________   PHYSICAL EXAM:  VITAL SIGNS: ED Triage Vitals  Enc Vitals Group     BP 12/14/17 1045 139/77     Pulse Rate 12/14/17 1045 98     Resp --      Temp 12/14/17 1045 98.3 F (36.8 C)     Temp Source 12/14/17 1045 Oral     SpO2 12/14/17 1045 99 %     Weight 12/14/17 1046 240 lb (108.9 kg)     Height 12/14/17 1046 6\' 2"  (1.88 m)     Head Circumference --      Peak Flow --      Pain Score 12/14/17 1113 6     Pain Loc --      Pain Edu? --      Excl. in GC? --     Vital signs reviewed, nursing assessments reviewed.   Constitutional:   Alert and oriented. Well appearing and in no distress. Eyes:   No scleral icterus.  EOMI. No nystagmus. No conjunctival pallor. PERRL. ENT   Head:   Normocephalic and atraumatic.   Nose:   No congestion/rhinnorhea.    Mouth/Throat:   MMM, no pharyngeal erythema. No peritonsillar mass.    Neck:   No meningismus. Full ROM. Hematological/Lymphatic/Immunilogical:   No cervical lymphadenopathy. Cardiovascular:   RRR. Symmetric bilateral radial and DP pulses.  No murmurs.  Respiratory:   Normal respiratory effort without tachypnea/retractions. Breath sounds are clear and equal bilaterally. No wheezes/rales/rhonchi. Gastrointestinal:   Soft and nontender. Non distended. There is no CVA tenderness.  No rebound, rigidity, or guarding. Genitourinary:   deferred Musculoskeletal:   Normal range of motion in all extremities. No joint effusions.  No lower extremity tenderness.  No edema. Neurologic:   Normal speech and language.  slight tremulousness Motor grossly intact. No acute focal neurologic deficits are appreciated.  Skin:    Skin is warm, dry and intact.no flushing or diaphoresis. No piloerection  ____________________________________________    LABS (pertinent positives/negatives) (all labs ordered are listed, but only abnormal results are  displayed) Labs Reviewed  COMPREHENSIVE METABOLIC PANEL - Abnormal; Notable for the following components:      Result Value   Sodium 134 (*)    Potassium 3.4 (*)    Chloride 97 (*)    Glucose, Bld 189 (*)    Calcium 8.3 (*)    AST 108 (*)    ALT 67 (*)    Total Bilirubin 2.3 (*)    All other components within normal limits  CBC - Abnormal; Notable for the following components:   RBC 3.83 (*)    MCV 108.2 (*)    MCH 37.9 (*)    RDW 22.9 (*)    All other components within normal limits  ACETAMINOPHEN LEVEL - Abnormal; Notable for the following components:  Acetaminophen (Tylenol), Serum <10 (*)    All other components within normal limits  GLUCOSE, CAPILLARY - Abnormal; Notable for the following components:   Glucose-Capillary 188 (*)    All other components within normal limits  ETHANOL  SALICYLATE LEVEL  URINE DRUG SCREEN, QUALITATIVE ([facility] ONLY)   ____________________________________________   EKG    ____________________________________________    RADIOLOGY  No results found.  ____________________________________________   PROCEDURES Procedures  ____________________________________________  DIFFERENTIAL DIAGNOSIS   alcohol withdrawal, major depression, anxiety  CLINICAL IMPRESSION / ASSESSMENT AND PLAN / ED COURSE  Pertinent labs & imaging results that were available during my care of the patient were reviewed by me and considered in my medical decision making (see chart for details).     Clinical Course as of Dec 16 909  Wed Dec 14, 2017  1116 P/w depression, SI. Wants help. At risk of alcohol withdrawal. Start CIWA checks. Labs. Psych eval. Not commitable at this time.    [PS]  1211 Labs unremarkable. Ethanol negative. Continue to monitor for w/d sx. Oral librium given. F/u psych recs   [PS]  1449 Still feels anxious. Not drowsy. Nauseated. Zofran, librium repeat 25mg .    [PS]    Clinical Course User Index [PS] Sharman CheekStafford, Cory Halls, MD      ____________________________________________   FINAL CLINICAL IMPRESSION(S) / ED DIAGNOSES    Final diagnoses:  Suicidal ideation  Depression, unspecified depression type  Uncomplicated alcohol dependence [facility](HCC)     ED Discharge Orders    None      Portions of this note were generated with dragon dictation software. Dictation errors may occur despite best attempts at proofreading.    Sharman CheekStafford, Cory Mcdowell, MD 12/14/17 1215    Sharman CheekStafford, Cory Hobart, MD 12/16/17 [phone]

## 2017-12-14 NOTE — ED Triage Notes (Signed)
Pt reports has trouble with alcohol and has been severely depressed and feeling suicidal. Pt states, "It is probably a good thing someone brought me in or I wouldn't be alive". Pt also reports some NV over the last few days. Pt reports drinks about a 12 pack a day for the past year.

## 2017-12-14 NOTE — ED Notes (Signed)
Pt. Alert and oriented, warm and dry, in no distress. Pt. Denies SI, HI, and AVH. Pt. Encouraged to let nursing staff know of any concerns or needs. 

## 2017-12-14 NOTE — ED Notes (Signed)
Pt Voluntary pending placement. 

## 2017-12-14 NOTE — ED Notes (Signed)
meds administered as ordered  - pt to transfer to behavioral holding unit - I educated him about his transfer and what to expect  Continue to monitor

## 2017-12-14 NOTE — ED Notes (Signed)
Pt calm and cooperative. Endorses SI. Contracts for safety. Denies HI and AVH. Pt states he has not been taking care of himself. Pt has one son that is a drug addict and his whereabouts are unknown. Pt [place] a granddaughter [place] and the baby's mother is also on drugs. Pt states he hasn't been able to deal with all the stress in his life. Maintained on 15 minute checks and observation by security camera for safety.

## 2017-12-14 NOTE — Consult Note (Signed)
[facility] Consult   Reason for Consult: Consult for 62 year old man who comes to the emergency room intoxicated with suicidal ideation Referring Physician: Joni Fears Patient Identification: Cory Foster. MRN:  [id number] Principal Diagnosis: Severe recurrent major depression without psychotic features [facility]) Diagnosis:   Patient Active Problem List   Diagnosis Date Noted  . Severe recurrent major depression without psychotic features ([place]) [F33.2] 12/14/2017  . Diabetes ([place]) [E11.9] 12/14/2017  . Hypertension [I10]   . Hyperlipidemia [E78.5]   . GERD (gastroesophageal reflux disease) [K21.9]   . Depression [F32.9]   . Substance abuse ([place]) [F19.10]   . Anxiety [F41.9]   . DDD (degenerative disc disease) [IMO0002]   . Psoriasis [L40.9]   . Alcohol dependence ([place]) [F10.20] 08/08/2013  . MDD (major depressive disorder) [F32.9] 08/08/2013  . GAD (generalized anxiety disorder) [F41.1] 08/08/2013    Total Time spent with patient: 1 hour  Subjective:   Cory Vera. is a 62 y.o. male patient admitted with "I am feeling very bad"  HPI: Patient seen chart reviewed.  62 year old man brought to the hospital by family.  Intoxicated.  Patient reports severely depressed mood.  Feels sad down and negative all the time.  Depression has been going on for months and getting worse.  Reports thoughts about wanting to die not wanting to live anymore feeling like he wishes he were dead.  He sleeps very poorly and irregularly.  Has not been eating well.  He is drinking heavily every day.  Probably at least 12 of the 24 ounce cans of high alcohol beverage.  The collecting his health.  Major life stress is worrying about his son who he says is addicted to drugs.  Patient is prescribed medicine for depression and says he takes it regularly.  Medical history: Diabetes.  Elevated cholesterol.  History of chronic pain.  Social history: Lives by himself.  Gets disability for his  back.  Substance abuse history: Long-standing alcohol abuse.  Longest sobriety about a year.  Denies other drug use.  No known history of seizures or delirium tremens.  Past Psychiatric History: Patient denies past suicide attempts.  Has been treated for depression for years.  Used to find Zoloft helpful.  Also takes Klonopin.  Provided by his primary care doctor.  Patient has been to behavioral health Hospital in [place] for detox and treatment of depression in the past.  Risk to Self: Suicidal Ideation: Yes-Currently Present Suicidal Intent: Yes-Currently Present Is patient at risk for suicide?: Yes Suicidal Plan?: Yes-Currently Present Specify Current Suicidal Plan: overdose on Klonopin Access to Means: Yes Specify Access to Suicidal Means: guns at sons house What has been your use of drugs/alcohol within the last 12 months?: alcohol use daily  How many times?: 0 Other Self Harm Risks: None reported  Triggers for Past Attempts: Unpredictable Intentional Self Injurious Behavior: None Risk to Others: Homicidal Ideation: No Thoughts of Harm to Others: No Current Homicidal Intent: No Current Homicidal Plan: No Access to Homicidal Means: No Identified Victim: None reported  History of harm to others?: No Assessment of Violence: None Noted Violent Behavior Description: None reported  Does patient have access to weapons?: Yes (Comment) Criminal Charges Pending?: No Does patient have a court date: No Prior Inpatient Therapy: Prior Inpatient Therapy: Yes Prior Therapy Dates: Unknown [facility] Reason for Treatment: Depression Prior Outpatient Therapy: Prior Outpatient Therapy: No Does patient have an ACCT team?: No Does patient have Intensive In-House Services?  :  No Does patient have [facility] services? : No Does patient have P4CC services?: No  Past Medical History:  Past Medical History:  Diagnosis Date  . Anxiety   . DDD (degenerative  disc disease)   . Depression   . GERD (gastroesophageal reflux disease)   . Hyperlipidemia   . Hypertension   . Psoriasis   . Substance abuse ([place])   . Substance induced mood disorder ([place])   . Varicose vein     Past Surgical History:  Procedure Laterality Date  . CHOLECYSTECTOMY    . SINUSOTOMY    . VEIN LIGATION AND STRIPPING     Family History:  Family History  Problem Relation Age of Onset  . Depression Mother   . Anxiety disorder Mother   . Cancer Father    Family Psychiatric  History: Positive for his mother being depressed and his son having a drug abuse problem Social History:  Social History   Substance and Sexual Activity  Alcohol Use Yes   Comment: couple of beers     Social History   Substance and Sexual Activity  Drug Use No    Social History   Socioeconomic History  . Marital status: Divorced    Spouse name: None  . Number of children: None  . Years of education: None  . Highest education level: None  Social Needs  . Financial resource strain: None  . Food insecurity - worry: None  . Food insecurity - inability: None  . Transportation needs - medical: None  . Transportation needs - non-medical: None  Occupational History  . None  Tobacco Use  . Smoking status: Current Every Day Smoker    Packs/day: 0.50    Types: Cigarettes  . Smokeless tobacco: Never Used  Substance and Sexual Activity  . Alcohol use: Yes    Comment: couple of beers  . Drug use: No  . Sexual activity: None  Other Topics Concern  . None  Social History Narrative  . None   Additional Social History:    Allergies:   Allergies  Allergen Reactions  . Ceclor [Cefaclor]     unknown  . Ciprofloxacin     unknown  . Citalopram     Hyper   . Seroquel [Quetiapine Fumarate]     "hung over" feeling    Labs:  Results for orders placed or performed during the hospital encounter of 12/14/17 (from the past 48 hour(s))  Comprehensive metabolic panel     Status: Abnormal    Collection Time: 12/14/17 10:51 AM  Result Value Ref Range   Sodium 134 (L) 135 - 145 mmol/L   Potassium 3.4 (L) 3.5 - 5.1 mmol/L   Chloride 97 (L) 101 - 111 mmol/L   CO2 27 22 - 32 mmol/L   Glucose, Bld 189 (H) 65 - 99 mg/dL   BUN 9 6 - 20 mg/dL   Creatinine, Ser 1.03 0.61 - 1.24 mg/dL   Calcium 8.3 (L) 8.9 - 10.3 mg/dL   Total Protein 7.7 6.5 - 8.1 g/dL   Albumin 3.5 3.5 - 5.0 g/dL   AST 108 (H) 15 - 41 U/L   ALT 67 (H) 17 - 63 U/L   Alkaline Phosphatase 82 38 - 126 U/L   Total Bilirubin 2.3 (H) 0.3 - 1.2 mg/dL   GFR calc non Af Amer >60 >60 mL/min   GFR calc Af Amer >60 >60 mL/min    Comment: (NOTE) The eGFR has been calculated using the CKD EPI equation. This  calculation has not been validated in all clinical situations. eGFR's persistently <60 mL/min signify possible Chronic Kidney Disease.    Anion gap 10 5 - 15    Comment: Performed at [facility], [address] [place], [place] [address]  Ethanol     Status: None   Collection Time: 12/14/17 10:51 AM  Result Value Ref Range   Alcohol, Ethyl (B) <10 <10 mg/dL    Comment:        LOWEST DETECTABLE LIMIT FOR SERUM ALCOHOL IS 10 mg/dL FOR MEDICAL PURPOSES ONLY Performed at [facility], [address] [place], [place] [address]   cbc     Status: Abnormal   Collection Time: 12/14/17 10:51 AM  Result Value Ref Range   WBC 7.1 3.8 - 10.6 K/uL   RBC 3.83 (L) 4.40 - 5.90 MIL/uL   Hemoglobin 14.5 13.0 - 18.0 g/dL   HCT 41.5 40.0 - 52.0 %   MCV 108.2 (H) 80.0 - 100.0 fL   MCH 37.9 (H) 26.0 - 34.0 pg   MCHC 35.0 32.0 - 36.0 g/dL   RDW 22.9 (H) 11.5 - 14.5 %   Platelets 177 150 - 440 K/uL    Comment: Performed at [facility], [address], [place], [place] [address]  Urine Drug Screen, Qualitative     Status: Abnormal   Collection Time: 12/14/17 10:51 AM  Result Value Ref Range   Tricyclic, Ur Screen NONE DETECTED NONE DETECTED   Amphetamines, Ur Screen NONE DETECTED NONE DETECTED    MDMA (Ecstasy)Ur Screen NONE DETECTED NONE DETECTED   Cocaine Metabolite,Ur [place] NONE DETECTED NONE DETECTED   Opiate, Ur Screen NONE DETECTED NONE DETECTED   Phencyclidine (PCP) Ur S NONE DETECTED NONE DETECTED   Cannabinoid 50 Ng, Ur [place] NONE DETECTED NONE DETECTED   Barbiturates, Ur Screen NONE DETECTED NONE DETECTED   Benzodiazepine, Ur Scrn POSITIVE (A) NONE DETECTED   Methadone Scn, Ur NONE DETECTED NONE DETECTED    Comment: (NOTE) Tricyclics + metabolites, urine    Cutoff 1000 ng/mL Amphetamines + metabolites, urine  Cutoff 1000 ng/mL MDMA (Ecstasy), urine              Cutoff 500 ng/mL Cocaine Metabolite, urine          Cutoff 300 ng/mL Opiate + metabolites, urine        Cutoff 300 ng/mL Phencyclidine (PCP), urine         Cutoff 25 ng/mL Cannabinoid, urine                 Cutoff 50 ng/mL Barbiturates + metabolites, urine  Cutoff 200 ng/mL Benzodiazepine, urine              Cutoff 200 ng/mL Methadone, urine                   Cutoff 300 ng/mL The urine drug screen provides only a preliminary, unconfirmed analytical test result and should not be used for non-medical purposes. Clinical consideration and professional judgment should be applied to any positive drug screen result due to possible interfering substances. A more specific alternate chemical method must be used in order to obtain a confirmed analytical result. Gas chromatography / mass spectrometry (GC/MS) is the preferred confirmat ory method. Performed at [facility], [address] [place], [place] [address]   Salicylate level     Status: None   Collection Time: 12/14/17 10:51 AM  Result Value Ref Range   Salicylate Lvl <4.4 2.8 -  30.0 mg/dL    Comment: Performed at [facility], [address] [place], [place] [address]  Acetaminophen level     Status: Abnormal   Collection Time: 12/14/17 10:51 AM  Result Value Ref Range   Acetaminophen (Tylenol), Serum <10 (L) 10 - 30 ug/mL    Comment:         THERAPEUTIC CONCENTRATIONS VARY SIGNIFICANTLY. A RANGE OF 10-30 ug/mL MAY BE AN EFFECTIVE CONCENTRATION FOR MANY PATIENTS. HOWEVER, SOME ARE BEST TREATED AT CONCENTRATIONS OUTSIDE THIS RANGE. ACETAMINOPHEN CONCENTRATIONS >150 ug/mL AT 4 HOURS AFTER INGESTION AND >50 ug/mL AT 12 HOURS AFTER INGESTION ARE OFTEN ASSOCIATED WITH TOXIC REACTIONS. Performed at [facility], [place], [place] [address]   Glucose, capillary     Status: Abnormal   Collection Time: 12/14/17 10:57 AM  Result Value Ref Range   Glucose-Capillary 188 (H) 65 - 99 mg/dL   Comment 1 Notify RN    Comment 2 Document in Chart     Current Facility-Administered Medications  Medication Dose Route Frequency Provider Last Rate Last Dose  . atorvastatin (LIPITOR) tablet 80 mg  80 mg Oral q1800 Bonne Whack T, MD      . clonazePAM (KLONOPIN) tablet 2 mg  2 mg Oral BID Keyera Hattabaugh T, MD      . folic acid (FOLVITE) tablet 1 mg  1 mg Oral Daily Ivory Maduro T, MD      . insulin glargine (LANTUS) injection 16 Units  16 Units Subcutaneous QHS Aylanie Cubillos T, MD      . linagliptin (TRADJENTA) tablet 5 mg  5 mg Oral Daily Royal Vandevoort T, MD      . LORazepam (ATIVAN) tablet 1 mg  1 mg Oral Q6H PRN Irania Durell, Madie Reno, MD       Or  . LORazepam (ATIVAN) injection 1 mg  1 mg Intravenous Q6H PRN Raychell Holcomb T, MD      . metFORMIN (GLUCOPHAGE) tablet 1,000 mg  1,000 mg Oral BID WC Oaklen Thiam T, MD      . multivitamin with minerals tablet 1 tablet  1 tablet Oral Daily Brandan Robicheaux T, MD      . ondansetron (ZOFRAN-ODT) 4 MG disintegrating tablet           . sertraline (ZOLOFT) tablet 100 mg  100 mg Oral BID Alexa Golebiewski T, MD      . thiamine (VITAMIN B-1) tablet 100 mg  100 mg Oral Daily Ashleigh Luckow T, MD       Or  . thiamine (B-1) injection 100 mg  100 mg Intravenous Daily Sophie Tamez, Madie Reno, MD       Current Outpatient Medications  Medication Sig Dispense Refill  . amoxicillin-clavulanate  (AUGMENTIN) 875-125 MG tablet Take 1 tablet by mouth 2 (two) times daily.  0  . atorvastatin (LIPITOR) 80 MG tablet Take 40 mg by mouth daily.    . Cholecalciferol (VITAMIN D-3) 5000 UNITS TABS Take 5,000 Units by mouth daily.     . clonazePAM (KLONOPIN) 2 MG tablet Take 1 tablet (2 mg total) by mouth 2 (two) times daily. 3 tablet 0  . Magnesium 250 MG TABS Take 250 mg by mouth daily.     . metFORMIN (GLUCOPHAGE) 500 MG tablet Take 500 mg by mouth 2 (two) times daily with a meal.    . Multiple Vitamin (MULTIVITAMIN WITH MINERALS) TABS tablet Take 1 tablet by mouth every morning.    . predniSONE (DELTASONE) 20 MG tablet  Take 2 tablets (40 mg total) by mouth daily with breakfast. For the next four days 8 tablet 0  . sertraline (ZOLOFT) 100 MG tablet Take 1 tablet (100 mg total) by mouth 2 (two) times daily. For depression/anxiety 60 tablet 0  . traMADol (ULTRAM) 50 MG tablet Take 1 tablet (50 mg total) by mouth every 6 (six) hours as needed. 15 tablet 0    Musculoskeletal: Strength & Muscle Tone: within normal limits Gait & Station: unsteady Patient leans: N/A  Psychiatric Specialty Exam: Physical Exam  Nursing note and vitals reviewed. Constitutional: He appears well-developed and well-nourished.  HENT:  Head: Normocephalic and atraumatic.  Eyes: Conjunctivae are normal. Pupils are equal, round, and reactive to light.  Neck: Normal range of motion.  Cardiovascular: Regular rhythm and normal heart sounds.  Respiratory: Effort normal. No respiratory distress.  GI: Soft.  Musculoskeletal: Normal range of motion.  Neurological: He is alert.  Skin: Skin is warm and dry.  Psychiatric: His mood appears anxious. His speech is delayed. He is slowed and withdrawn. Thought content is not paranoid. Cognition and memory are impaired. He expresses impulsivity. He exhibits a depressed mood. He expresses suicidal ideation. He expresses no homicidal ideation.    Review of Systems  Constitutional:  Negative.   HENT: Negative.   Eyes: Negative.   Respiratory: Negative.   Cardiovascular: Negative.   Gastrointestinal: Positive for abdominal pain and nausea.  Musculoskeletal: Negative.   Skin: Negative.   Neurological: Positive for tremors.  Psychiatric/Behavioral: Positive for depression, substance abuse and suicidal ideas. Negative for hallucinations. The patient is nervous/anxious and has insomnia.     Blood pressure 139/77, pulse 98, temperature 98.3 F (36.8 C), temperature source Oral, height '6\' 2"'  (1.88 m), weight 108.9 kg (240 lb), SpO2 99 %.Body mass index is 30.81 kg/m.  General Appearance: Disheveled  Eye Contact:  Fair  Speech:  Slow  Volume:  Decreased  Mood:  Depressed  Affect:  Congruent  Thought Process:  Goal Directed  Orientation:  Full (Time, Place, and Person)  Thought Content:  Logical  Suicidal Thoughts:  Yes.  without intent/plan  Homicidal Thoughts:  No  Memory:  Immediate;   Fair Recent;   Fair Remote;   Fair  Judgement:  Impaired  Insight:  Shallow  Psychomotor Activity:  Tremor  Concentration:  Concentration: Poor  Recall:  [employer] of Knowledge:  Fair  Language:  Fair  Akathisia:  No  Handed:  Right  AIMS (if indicated):     Assets:  Desire for Improvement Housing  ADL's:  Impaired  Cognition:  Impaired,  Mild  Sleep:        Treatment Plan Summary: Daily contact with patient to assess and evaluate symptoms and progress in treatment, Medication management and Plan 62 year old man having serious symptoms of alcohol withdrawal.  Very tremulous.  Poor health care.  Active suicidal thoughts very depressed.  Patient meets criteria for admission to the hospital.  Detox orders in place.  Continue outpatient psychiatric medicines as well as medicines for diabetes and cholesterol.  Patient will be pended for admission to the psychiatric ward.  Admission orders completed.  Disposition: Recommend psychiatric Inpatient admission when medically  cleared. Supportive therapy provided about ongoing stressors.  Alethia Berthold, MD 12/14/2017 5:32 PM

## 2017-12-14 NOTE — ED Notes (Signed)
CBG 188. Tiffany,RN aware.

## 2017-12-14 NOTE — ED Notes (Signed)
Pt unable to urinate at this time, given specimen cup and ambulated back to 19H with Tiffany.

## 2017-12-15 NOTE — ED Notes (Signed)
EMTALA reviewed. 

## 2017-12-15 NOTE — BH Assessment (Signed)
Patient has been accepted to [facility].  [facility] is Dr. Hilbert CorriganGacengci.  Call report to [phone]) [phone].  Representative was [employer]aron.  Can arrive after 9am  ER Staff is aware of it:  Bonita QuinLinda ER Sect.;  Dr. Manson PasseyBrown, ER MD  Cala BradfordKimberly Patient's Nurse

## 2017-12-15 NOTE — ED Provider Notes (Signed)
Vitals:   12/14/17 1830 12/14/17 2149  BP: 133/86 109/85  Pulse: 90 (!) 103  Resp: 18   Temp:    SpO2:      TTS informed me that the patient has been accepted [facility] this morning.  I did complete the transfer documents.   Governor RooksLord, Marvelene Stoneberg, MD 12/15/17 [phone]

## 2017-12-15 NOTE — ED Notes (Addendum)
E signature not provided.  Patient IVC. Patient discharged in route to [facility].  [facility].

## 2018-01-01 ENCOUNTER — Emergency Department (HOSPITAL_COMMUNITY): Payer: Medicare Other

## 2018-01-01 ENCOUNTER — Inpatient Hospital Stay (HOSPITAL_COMMUNITY): Payer: Medicare Other

## 2018-01-01 ENCOUNTER — Inpatient Hospital Stay (HOSPITAL_COMMUNITY)
Admission: EM | Admit: 2018-01-01 | Discharge: 2018-01-16 | DRG: 917 | Disposition: A | Discharge status: Skilled Nursing Facility | Payer: Medicare Other | Attending: Internal Medicine | Admitting: Internal Medicine

## 2018-01-01 ENCOUNTER — Other Ambulatory Visit: Payer: Self-pay

## 2018-01-01 ENCOUNTER — Encounter (HOSPITAL_COMMUNITY): Payer: Self-pay

## 2018-01-01 DIAGNOSIS — F17210 Nicotine dependence, cigarettes, uncomplicated: Secondary | ICD-10-CM | POA: Diagnosis present

## 2018-01-01 DIAGNOSIS — D6959 Other secondary thrombocytopenia: Secondary | ICD-10-CM | POA: Diagnosis present

## 2018-01-01 DIAGNOSIS — J690 Pneumonitis due to inhalation of food and vomit: Secondary | ICD-10-CM | POA: Diagnosis present

## 2018-01-01 DIAGNOSIS — I10 Essential (primary) hypertension: Secondary | ICD-10-CM | POA: Diagnosis present

## 2018-01-01 DIAGNOSIS — J9602 Acute respiratory failure with hypercapnia: Secondary | ICD-10-CM | POA: Diagnosis present

## 2018-01-01 DIAGNOSIS — M79672 Pain in left foot: Secondary | ICD-10-CM | POA: Diagnosis present

## 2018-01-01 DIAGNOSIS — T5194XA Toxic effect of unspecified alcohol, undetermined, initial encounter: Secondary | ICD-10-CM | POA: Diagnosis not present

## 2018-01-01 DIAGNOSIS — W1830XA Fall on same level, unspecified, initial encounter: Secondary | ICD-10-CM | POA: Diagnosis present

## 2018-01-01 DIAGNOSIS — F10229 Alcohol dependence with intoxication, unspecified: Secondary | ICD-10-CM | POA: Diagnosis present

## 2018-01-01 DIAGNOSIS — Y9259 Other trade areas as the place of occurrence of the external cause: Secondary | ICD-10-CM | POA: Diagnosis not present

## 2018-01-01 DIAGNOSIS — F1020 Alcohol dependence, uncomplicated: Secondary | ICD-10-CM | POA: Diagnosis not present

## 2018-01-01 DIAGNOSIS — F05 Delirium due to known physiological condition: Secondary | ICD-10-CM | POA: Diagnosis not present

## 2018-01-01 DIAGNOSIS — E785 Hyperlipidemia, unspecified: Secondary | ICD-10-CM | POA: Diagnosis present

## 2018-01-01 DIAGNOSIS — D638 Anemia in other chronic diseases classified elsewhere: Secondary | ICD-10-CM | POA: Diagnosis present

## 2018-01-01 DIAGNOSIS — E1165 Type 2 diabetes mellitus with hyperglycemia: Secondary | ICD-10-CM | POA: Diagnosis present

## 2018-01-01 DIAGNOSIS — Y908 Blood alcohol level of 240 mg/100 ml or more: Secondary | ICD-10-CM | POA: Diagnosis present

## 2018-01-01 DIAGNOSIS — J9601 Acute respiratory failure with hypoxia: Secondary | ICD-10-CM | POA: Diagnosis present

## 2018-01-01 DIAGNOSIS — R6520 Severe sepsis without septic shock: Secondary | ICD-10-CM | POA: Diagnosis present

## 2018-01-01 DIAGNOSIS — F332 Major depressive disorder, recurrent severe without psychotic features: Secondary | ICD-10-CM | POA: Diagnosis present

## 2018-01-01 DIAGNOSIS — T884XXA Failed or difficult intubation, initial encounter: Secondary | ICD-10-CM | POA: Diagnosis present

## 2018-01-01 DIAGNOSIS — J9811 Atelectasis: Secondary | ICD-10-CM | POA: Diagnosis present

## 2018-01-01 DIAGNOSIS — F1320 Sedative, hypnotic or anxiolytic dependence, uncomplicated: Secondary | ICD-10-CM | POA: Diagnosis present

## 2018-01-01 DIAGNOSIS — D7589 Other specified diseases of blood and blood-forming organs: Secondary | ICD-10-CM | POA: Diagnosis present

## 2018-01-01 DIAGNOSIS — F1910 Other psychoactive substance abuse, uncomplicated: Secondary | ICD-10-CM | POA: Diagnosis present

## 2018-01-01 DIAGNOSIS — K219 Gastro-esophageal reflux disease without esophagitis: Secondary | ICD-10-CM | POA: Diagnosis present

## 2018-01-01 DIAGNOSIS — A419 Sepsis, unspecified organism: Secondary | ICD-10-CM | POA: Diagnosis present

## 2018-01-01 DIAGNOSIS — F10230 Alcohol dependence with withdrawal, uncomplicated: Secondary | ICD-10-CM | POA: Diagnosis not present

## 2018-01-01 DIAGNOSIS — T510X1A Toxic effect of ethanol, accidental (unintentional), initial encounter: Secondary | ICD-10-CM | POA: Diagnosis present

## 2018-01-01 DIAGNOSIS — Z794 Long term (current) use of insulin: Secondary | ICD-10-CM

## 2018-01-01 DIAGNOSIS — R52 Pain, unspecified: Secondary | ICD-10-CM

## 2018-01-01 DIAGNOSIS — Z79899 Other long term (current) drug therapy: Secondary | ICD-10-CM

## 2018-01-01 DIAGNOSIS — Z6834 Body mass index (BMI) 34.0-34.9, adult: Secondary | ICD-10-CM

## 2018-01-01 DIAGNOSIS — G92 Toxic encephalopathy: Secondary | ICD-10-CM | POA: Diagnosis present

## 2018-01-01 DIAGNOSIS — G9340 Encephalopathy, unspecified: Secondary | ICD-10-CM

## 2018-01-01 DIAGNOSIS — T424X1A Poisoning by benzodiazepines, accidental (unintentional), initial encounter: Secondary | ICD-10-CM | POA: Diagnosis present

## 2018-01-01 DIAGNOSIS — Z888 Allergy status to other drugs, medicaments and biological substances status: Secondary | ICD-10-CM

## 2018-01-01 DIAGNOSIS — L409 Psoriasis, unspecified: Secondary | ICD-10-CM | POA: Diagnosis present

## 2018-01-01 LAB — BLOOD GAS, ARTERIAL
ACID-BASE DEFICIT: 6.9 mmol/L — AB (ref 0.0–2.0)
Acid-base deficit: 5.4 mmol/L — ABNORMAL HIGH (ref 0.0–2.0)
BICARBONATE: 23.5 mmol/L (ref 20.0–28.0)
Bicarbonate: 21 mmol/L (ref 20.0–28.0)
Drawn by: 232811
FIO2: 100
LHR: 20 {breaths}/min
O2 Content: 15 L/min
O2 SAT: 99.2 %
O2 Saturation: 97.8 %
PATIENT TEMPERATURE: 99
PCO2 ART: 60.9 mmHg — AB (ref 32.0–48.0)
PEEP/CPAP: 5 cmH2O
PH ART: 7.212 — AB (ref 7.350–7.450)
PH ART: 7.225 — AB (ref 7.350–7.450)
PO2 ART: 131 mmHg — AB (ref 83.0–108.0)
Patient temperature: 37
VT: 620 mL
pCO2 arterial: 52.8 mmHg — ABNORMAL HIGH (ref 32.0–48.0)
pO2, Arterial: 292 mmHg — ABNORMAL HIGH (ref 83.0–108.0)

## 2018-01-01 LAB — CBC WITH DIFFERENTIAL/PLATELET
BASOS PCT: 1 %
Basophils Absolute: 0.1 10*3/uL (ref 0.0–0.1)
EOS ABS: 0.3 10*3/uL (ref 0.0–0.7)
EOS PCT: 2 %
HCT: 51.4 % (ref 39.0–52.0)
HEMOGLOBIN: 17.6 g/dL — AB (ref 13.0–17.0)
LYMPHS ABS: 2.9 10*3/uL (ref 0.7–4.0)
Lymphocytes Relative: 25 %
MCH: 37.5 pg — AB (ref 26.0–34.0)
MCHC: 34.2 g/dL (ref 30.0–36.0)
MCV: 109.6 fL — ABNORMAL HIGH (ref 78.0–100.0)
MONO ABS: 0.7 10*3/uL (ref 0.1–1.0)
MONOS PCT: 6 %
NEUTROS PCT: 66 %
Neutro Abs: 7.9 10*3/uL — ABNORMAL HIGH (ref 1.7–7.7)
Platelets: 325 10*3/uL (ref 150–400)
RBC: 4.69 MIL/uL (ref 4.22–5.81)
RDW: 18.4 % — AB (ref 11.5–15.5)
WBC: 11.9 10*3/uL — ABNORMAL HIGH (ref 4.0–10.5)

## 2018-01-01 LAB — COMPREHENSIVE METABOLIC PANEL
ALT: 75 U/L — ABNORMAL HIGH (ref 17–63)
ANION GAP: 15 (ref 5–15)
AST: 96 U/L — ABNORMAL HIGH (ref 15–41)
Albumin: 4 g/dL (ref 3.5–5.0)
Alkaline Phosphatase: 79 U/L (ref 38–126)
BUN: 7 mg/dL (ref 6–20)
CALCIUM: 9 mg/dL (ref 8.9–10.3)
CO2: 22 mmol/L (ref 22–32)
Chloride: 104 mmol/L (ref 101–111)
Creatinine, Ser: 1.06 mg/dL (ref 0.61–1.24)
GFR calc non Af Amer: >60 mL/min (ref >60)
Glucose, Bld: 178 mg/dL — ABNORMAL HIGH (ref 65–99)
POTASSIUM: 4.7 mmol/L (ref 3.5–5.1)
SODIUM: 141 mmol/L (ref 135–145)
TOTAL PROTEIN: 8.6 g/dL — AB (ref 6.5–8.1)
Total Bilirubin: 0.6 mg/dL (ref 0.3–1.2)

## 2018-01-01 LAB — RAPID URINE DRUG SCREEN, HOSP PERFORMED
Amphetamines: NOT DETECTED
BENZODIAZEPINES: POSITIVE — AB
Barbiturates: NOT DETECTED
Cocaine: NOT DETECTED
OPIATES: NOT DETECTED
Tetrahydrocannabinol: NOT DETECTED

## 2018-01-01 LAB — I-STAT CG4 LACTIC ACID, ED: LACTIC ACID, VENOUS: 3.58 mmol/L — AB (ref 0.5–1.9)

## 2018-01-01 LAB — SALICYLATE LEVEL

## 2018-01-01 LAB — I-STAT TROPONIN, ED: Troponin i, poc: 0.01 ng/mL (ref 0.00–0.08)

## 2018-01-01 LAB — ETHANOL: ALCOHOL ETHYL (B): 424 mg/dL — AB (ref <10)

## 2018-01-01 LAB — AMMONIA: Ammonia: 41 umol/L — ABNORMAL HIGH (ref 9–35)

## 2018-01-01 LAB — CBG MONITORING, ED: GLUCOSE-CAPILLARY: 138 mg/dL — AB (ref 65–99)

## 2018-01-01 LAB — LACTIC ACID, PLASMA: Lactic Acid, Venous: 2.5 mmol/L (ref 0.5–1.9)

## 2018-01-01 LAB — ACETAMINOPHEN LEVEL

## 2018-01-01 MED ORDER — SODIUM CHLORIDE 0.9 % IV BOLUS
1000.0000 mL | Freq: Once | INTRAVENOUS | Status: AC
  Administered 2018-01-01: 1000 mL via INTRAVENOUS

## 2018-01-01 MED ORDER — ETOMIDATE 2 MG/ML IV SOLN
INTRAVENOUS | Status: AC | PRN
  Administered 2018-01-01: 20 mg via INTRAVENOUS

## 2018-01-01 MED ORDER — FENTANYL BOLUS VIA INFUSION
50.0000 ug | INTRAVENOUS | Status: DC | PRN
  Administered 2018-01-01 – 2018-01-02 (×2): 50 ug via INTRAVENOUS
  Filled 2018-01-01: qty 50

## 2018-01-01 MED ORDER — FOLIC ACID 1 MG PO TABS
1.0000 mg | ORAL_TABLET | Freq: Every day | ORAL | Status: DC
  Administered 2018-01-02 – 2018-01-03 (×2): 1 mg via ORAL
  Filled 2018-01-01 (×2): qty 1

## 2018-01-01 MED ORDER — SODIUM CHLORIDE 0.9 % IV BOLUS (SEPSIS)
1000.0000 mL | Freq: Once | INTRAVENOUS | Status: AC
  Administered 2018-01-02: 1000 mL via INTRAVENOUS

## 2018-01-01 MED ORDER — PIPERACILLIN-TAZOBACTAM 3.375 G IVPB
3.3750 g | INTRAVENOUS | Status: AC
  Administered 2018-01-02: 3.375 g via INTRAVENOUS
  Filled 2018-01-01: qty 50

## 2018-01-01 MED ORDER — PROPOFOL 1000 MG/100ML IV EMUL
5.0000 ug/kg/min | Freq: Once | INTRAVENOUS | Status: DC
  Filled 2018-01-01: qty 100

## 2018-01-01 MED ORDER — HEPARIN SODIUM (PORCINE) 5000 UNIT/ML IJ SOLN
5000.0000 [IU] | Freq: Three times a day (TID) | INTRAMUSCULAR | Status: DC
  Administered 2018-01-02 – 2018-01-16 (×41): 5000 [IU] via SUBCUTANEOUS
  Filled 2018-01-01 (×37): qty 1

## 2018-01-01 MED ORDER — MIDAZOLAM HCL 2 MG/2ML IJ SOLN
2.0000 mg | INTRAMUSCULAR | Status: AC | PRN
  Administered 2018-01-02 (×3): 2 mg via INTRAVENOUS
  Filled 2018-01-01 (×3): qty 2

## 2018-01-01 MED ORDER — ONDANSETRON HCL 4 MG/2ML IJ SOLN
4.0000 mg | Freq: Four times a day (QID) | INTRAMUSCULAR | Status: DC | PRN
  Administered 2018-01-03 – 2018-01-12 (×3): 4 mg via INTRAVENOUS
  Filled 2018-01-01 (×3): qty 2

## 2018-01-01 MED ORDER — DOCUSATE SODIUM 50 MG/5ML PO LIQD
100.0000 mg | Freq: Two times a day (BID) | ORAL | Status: DC
  Administered 2018-01-02 – 2018-01-03 (×4): 100 mg
  Filled 2018-01-01 (×4): qty 10

## 2018-01-01 MED ORDER — FENTANYL 2500MCG IN NS 250ML (10MCG/ML) PREMIX INFUSION
25.0000 ug/h | INTRAVENOUS | Status: DC
  Administered 2018-01-01: 25 ug/h via INTRAVENOUS
  Administered 2018-01-02: 400 ug/h via INTRAVENOUS
  Administered 2018-01-02 – 2018-01-03 (×2): 300 ug/h via INTRAVENOUS
  Administered 2018-01-03: 25 ug/h via INTRAVENOUS
  Filled 2018-01-01 (×5): qty 250

## 2018-01-01 MED ORDER — RANITIDINE HCL 150 MG/10ML PO SYRP
150.0000 mg | ORAL_SOLUTION | Freq: Two times a day (BID) | ORAL | Status: DC
  Administered 2018-01-02 – 2018-01-04 (×6): 150 mg
  Filled 2018-01-01 (×8): qty 10

## 2018-01-01 MED ORDER — ACETAMINOPHEN 160 MG/5ML PO SOLN
650.0000 mg | Freq: Four times a day (QID) | ORAL | Status: DC | PRN
  Administered 2018-01-02 – 2018-01-03 (×4): 650 mg
  Filled 2018-01-01 (×5): qty 20.3

## 2018-01-01 MED ORDER — SUCCINYLCHOLINE CHLORIDE 20 MG/ML IJ SOLN
INTRAMUSCULAR | Status: AC | PRN
  Administered 2018-01-01: 50 mg via INTRAVENOUS
  Administered 2018-01-01: 150 mg via INTRAVENOUS

## 2018-01-01 MED ORDER — ALBUTEROL SULFATE (2.5 MG/3ML) 0.083% IN NEBU: 2.5000 mg | INHALATION_SOLUTION | RESPIRATORY_TRACT | Status: DC | PRN

## 2018-01-01 MED ORDER — ADULT MULTIVITAMIN LIQUID CH
15.0000 mL | Freq: Every day | ORAL | Status: DC
  Administered 2018-01-02 – 2018-01-04 (×3): 15 mL
  Filled 2018-01-01 (×4): qty 15

## 2018-01-01 MED ORDER — INSULIN ASPART 100 UNIT/ML ~~LOC~~ SOLN
2.0000 [IU] | SUBCUTANEOUS | Status: DC
  Administered 2018-01-02 (×4): 4 [IU] via SUBCUTANEOUS
  Administered 2018-01-03: 6 [IU] via SUBCUTANEOUS
  Administered 2018-01-03 (×3): 4 [IU] via SUBCUTANEOUS

## 2018-01-01 MED ORDER — ACETAMINOPHEN 650 MG RE SUPP
650.0000 mg | Freq: Four times a day (QID) | RECTAL | Status: DC | PRN
  Administered 2018-01-09 (×2): 650 mg via RECTAL
  Filled 2018-01-01 (×3): qty 1

## 2018-01-01 MED ORDER — MIDAZOLAM HCL 2 MG/2ML IJ SOLN
1.0000 mg | INTRAMUSCULAR | Status: DC | PRN
  Filled 2018-01-01: qty 2

## 2018-01-01 MED ORDER — ETOMIDATE 2 MG/ML IV SOLN: 20.0000 mg | Freq: Once | INTRAVENOUS | Status: DC

## 2018-01-01 MED ORDER — MIDAZOLAM HCL 2 MG/2ML IJ SOLN
2.0000 mg | INTRAMUSCULAR | Status: DC | PRN
  Administered 2018-01-02 – 2018-01-04 (×8): 2 mg via INTRAVENOUS
  Filled 2018-01-01 (×7): qty 2

## 2018-01-01 MED ORDER — SODIUM CHLORIDE 0.9 % IV SOLN: 250.0000 mL | INTRAVENOUS | Status: DC | PRN

## 2018-01-01 MED ORDER — ORAL CARE MOUTH RINSE
15.0000 mL | Freq: Four times a day (QID) | OROMUCOSAL | Status: DC
  Administered 2018-01-02 – 2018-01-04 (×8): 15 mL via OROMUCOSAL

## 2018-01-01 MED ORDER — SUCCINYLCHOLINE CHLORIDE 20 MG/ML IJ SOLN
150.0000 mg | Freq: Once | INTRAMUSCULAR | Status: DC
  Filled 2018-01-01: qty 7.5

## 2018-01-01 MED ORDER — NALOXONE HCL 0.4 MG/ML IJ SOLN
0.4000 mg | Freq: Once | INTRAMUSCULAR | Status: AC
  Administered 2018-01-01: 0.4 mg via INTRAVENOUS
  Filled 2018-01-01: qty 1

## 2018-01-01 MED ORDER — PROPOFOL 1000 MG/100ML IV EMUL
INTRAVENOUS | Status: AC | PRN
  Administered 2018-01-01: 5 ug/kg/min via INTRAVENOUS

## 2018-01-01 MED ORDER — VITAMIN B-1 100 MG PO TABS
100.0000 mg | ORAL_TABLET | Freq: Every day | ORAL | Status: DC
  Administered 2018-01-02 – 2018-01-03 (×2): 100 mg via ORAL
  Filled 2018-01-01 (×2): qty 1

## 2018-01-01 MED ORDER — SODIUM CHLORIDE 0.9 % IV SOLN
INTRAVENOUS | Status: DC
  Administered 2018-01-01 – 2018-01-10 (×6): via INTRAVENOUS

## 2018-01-01 MED ORDER — FENTANYL CITRATE (PF) 100 MCG/2ML IJ SOLN: 50.0000 ug | Freq: Once | INTRAMUSCULAR | Status: DC

## 2018-01-01 MED ORDER — SODIUM CHLORIDE 0.9 % IV SOLN
INTRAVENOUS | Status: AC | PRN
  Administered 2018-01-01: 1000 mL via INTRAVENOUS

## 2018-01-01 MED ORDER — CHLORHEXIDINE GLUCONATE 0.12% ORAL RINSE (MEDLINE KIT)
15.0000 mL | Freq: Two times a day (BID) | OROMUCOSAL | Status: DC
  Administered 2018-01-02 – 2018-01-04 (×5): 15 mL via OROMUCOSAL

## 2018-01-01 NOTE — Sedation Documentation (Signed)
Medication dose calculated and verified for: Marinda ElkLisa Adkins, RN

## 2018-01-01 NOTE — ED Notes (Signed)
Patient transported to CT 

## 2018-01-01 NOTE — ED Notes (Signed)
No change in mentation after receiving narcan. Dr.James at bedside at time of administration.

## 2018-01-01 NOTE — Progress Notes (Signed)
Pharmacy Antibiotic Note  Cory MoralesRandall M Rubinstein Jr. is a 62 y.o. male admitted on 01/01/2018 with pneumonia.  Pharmacy has been consulted for zosyn dosing.  Plan: Zosyn 3.375g IV q8h (4 hour infusion). F/u scr/cultures  Height: 6' (182.9 cm) Weight: 240 lb (108.9 kg) IBW/kg (Calculated) : 77.6  Temp (24hrs), Avg:97.8 F (36.6 C), Min:95.7 F (35.4 C), Max:98.6 F (37 C)  Recent Labs  Lab 01/01/18 1340 01/01/18 1426 01/01/18 1957  WBC 11.9*  --   --   CREATININE 1.06  --   --   LATICACIDVEN  --  3.58* 2.5*    Estimated Creatinine Clearance: 94.4 mL/min (by C-G formula based on SCr of 1.06 mg/dL).    Allergies  Allergen Reactions  . Ceclor [Cefaclor]     unknown  . Ciprofloxacin     unknown  . Citalopram     Hyper   . Seroquel [Quetiapine Fumarate]     "hung over" feeling    Antimicrobials this admission: 4/7 zosyn >>    >>   Dose adjustments this admission:   Microbiology results:  BCx:   UCx:    Sputum:    MRSA PCR:   Thank you for allowing pharmacy to be a part of this patient's care.  Lorenza EvangelistGreen, [name] 01/01/2018 11:53 PM

## 2018-01-01 NOTE — ED Notes (Addendum)
Once back in room pt now moving legs and arms but still having difficulty following commands. Beginning of linen change and cleaning of pt begun with assistance. Pt had EMS mega mover mat removed and cleaned of urine that had saturated mat and linens.

## 2018-01-01 NOTE — ED Notes (Addendum)
CRITICAL VALUE STICKER  CRITICAL VALUE: Lactic 2.5  RECEIVER (on-site recipient of call): Jake T   DATE & TIME NOTIFIED: 01/01/18 2125  MESSENGER (representative from lab): Marisue IvanMohamed, A  MD NOTIFIED: Fayrene FearingJames MD  TIME OF NOTIFICATION: 2126  RESPONSE: see orders

## 2018-01-01 NOTE — ED Notes (Signed)
Dr. Fayrene FearingJames at bedside to perform ABG and observe when narcan is administered.

## 2018-01-01 NOTE — ED Provider Notes (Signed)
Care assumed from Dr. Anitra LauthPlunkett at 1600.  Patient was reported to me from Dr. Anitra LauthPlunkett is being found in a hotel room.  Multiple alcohol bottles.  No drug paraphernalia.  Positive toxicology for benzodiazepines.  Pending alcohol level.  I examined him with Dr. Anitra LauthPlunkett.  He is breathing spontaneously.  He is on mask O2.  He is minimally verbally responsive and unresponsive to painful stimuli.  He has normal blood sugar.  Plan was hydration.  We would attempt to allow him to metabolize alcohol and benzodiazepines.  I reexamined him.  He is failed to show improvement of his mental status over several hours time.  Initial alcohol level of 420 is now 6-7 hours old and his mental status has not improved.  Indeed he has lost a gag reflex.  ABG shows acute hypercarbic respiratory failure with PCO2 of 60, pH 7.2.  INTUBATION Performed by: Claudean KindsMark Joseph Santiaga Butzin  Required items: required blood products, implants, devices, and special equipment available Patient identity confirmed: provided demographic data and hospital-assigned identification number Time out: Immediately prior to procedure a "time out" was called to verify the correct patient, procedure, equipment, support staff and site/side marked as required.  Indications: REspiratory failure  Intubation method: +Glidescope Laryngoscopy   Preoxygenation: +BVM  Sedatives: 20 mg Etomidate Paralytic: 150mg  Succinylcholine  Tube Size: 7.5 cuffed  Post-procedure assessment: chest rise and ETCO2 monitor Breath sounds: equal and absent over the epigastrium Tube secured with: ETT holder Chest x-ray interpreted by radiologist and me.  Chest x-ray findings: +endotracheal tube in appropriate position  Patient tolerated the procedure well with no immediate complications. Patient was rather difficult intubation.  He does have some minimal upper airway bleeding associated with laryngoscope and tongue trauma.  No significant laceration.  Unable to visualize the  tongue using glide scope after intubation.  No ongoing bleeding.  G-tube placed by myself.  Post procedure chest x-ray and KUB show proper positioning.  Patient was not acidotic.  No reason to suspect other coingestions.  I theorized that he was sedate from alcohol and benzodiazepines and hypoventilated and became hypercarbic and thus did not improve his mental status despite metabolism of his ingestants.  CRITICAL CARE Performed by: Claudean KindsMark Joseph Ashlley Booher   Total critical care time: 45 minutes  Critical care time was exclusive of separately billable procedures and treating other patients.  Critical care was necessary to treat or prevent imminent or life-threatening deterioration.  Critical care was time spent personally by me on the following activities: development of treatment plan with patient and/or surrogate as well as nursing, discussions with consultants, evaluation of patient's response to treatment, examination of patient, obtaining history from patient or surrogate, ordering and performing treatments and interventions, ordering and review of laboratory studies, ordering and review of radiographic studies, pulse oximetry and re-evaluation of patient's condition.  Care discussed with Dr. Hosie PoissonSumner of critical care.  Dr. Antonietta BarcelonaHammons of critical care is here evaluating the patient currently.       Rolland PorterJames, Malee Grays, MD 01/01/18 2223

## 2018-01-01 NOTE — ED Notes (Signed)
Bed: RESA Expected date:  Expected time:  Means of arrival:  Comments: 62 yo ETOH, unresponsive

## 2018-01-01 NOTE — ED Provider Notes (Signed)
[facility]-EMERGENCY DEPT Provider Note   CSN: [id number] Arrival date & time: 01/01/18  1325     History   Chief Complaint Chief Complaint  Patient presents with  . Alcohol Intoxication    HPI Cory Foster. is a 62 y.o. male.  Patient is a 62 year old male with a history of depression, polysubstance abuse, hypertension, diabetes who was recently IV seed and evaluated by last month who presents by EMS for being unresponsive.  [facility] [place] [facility] [place] and around 9:00 this morning housekeeping came by to clean his room and noted that the patient was not responsive and leaning over the desk.  They left and came back 2 hours later and he was in the same position so they called 911.  Patient was found to be hypoxic by EMS at 83% on room air.  He was leaning over a desk on the floor with his clothing off.  Based on their report it appeared that he had soiled himself prior to falling to the floor.  Patient has not been responsive for them since they arrived.  A nasal trumpet was placed and oxygen saturation improved.  They did not notice any pills but stated there was a significant amount of alcohol bottles present in his room.  Unclear when patient was last seen normal  The history is provided by the EMS personnel. The history is limited by the condition of the patient.  Alcohol Intoxication  This is a recurrent problem.    Past Medical History:  Diagnosis Date  . Anxiety   . DDD (degenerative disc disease)   . Depression   . GERD (gastroesophageal reflux disease)   . Hyperlipidemia   . Hypertension   . Psoriasis   . Substance abuse (HCC)   . Substance induced mood disorder (HCC)   . Varicose vein     Patient Active Problem List   Diagnosis Date Noted  . Severe recurrent major depression without psychotic features (HCC) 12/14/2017  . Diabetes (HCC) 12/14/2017  . Hypertension   . Hyperlipidemia   . GERD (gastroesophageal reflux disease)     . Depression   . Substance abuse (HCC)   . Anxiety   . DDD (degenerative disc disease)   . Psoriasis   . Alcohol dependence (HCC) 08/08/2013  . MDD (major depressive disorder) 08/08/2013  . GAD (generalized anxiety disorder) 08/08/2013    Past Surgical History:  Procedure Laterality Date  . CHOLECYSTECTOMY    . SINUSOTOMY    . VEIN LIGATION AND STRIPPING          Home Medications    Prior to Admission medications   Medication Sig Start Date End Date Taking? Authorizing Provider  amoxicillin-clavulanate (AUGMENTIN) 875-125 MG tablet Take 1 tablet by mouth 2 (two) times daily. 01/12/16   [provider]  atorvastatin (LIPITOR) 80 MG tablet Take 40 mg by mouth daily.    [provider]  Cholecalciferol (VITAMIN D-3) 5000 UNITS TABS Take 5,000 Units by mouth daily.     [provider]  clonazePAM (KLONOPIN) 2 MG tablet Take 1 tablet (2 mg total) by mouth 2 (two) times daily. 03/06/17 03/08/17  Merrily Brittle, MD  Magnesium 250 MG TABS Take 250 mg by mouth daily.     [provider]  metFORMIN (GLUCOPHAGE) 500 MG tablet Take 500 mg by mouth 2 (two) times daily with a meal.    [provider]  Multiple Vitamin (MULTIVITAMIN WITH MINERALS) TABS tablet Take  1 tablet by mouth every morning.    [provider]  predniSONE (DELTASONE) 20 MG tablet Take 2 tablets (40 mg total) by mouth daily with breakfast. For the next four days 11/25/16   Gerhard MunchLockwood, Robert, MD  sertraline (ZOLOFT) 100 MG tablet Take 1 tablet (100 mg total) by mouth 2 (two) times daily. For depression/anxiety 08/15/13   Rachael FeeLugo, Irving A, MD  traMADol (ULTRAM) 50 MG tablet Take 1 tablet (50 mg total) by mouth every 6 (six) hours as needed. 11/25/16   Gerhard MunchLockwood, Robert, MD    Family History Family History  Problem Relation Age of Onset  . Depression Mother   . Anxiety disorder Mother   . Cancer Father     Social History Social History   Tobacco Use  . Smoking status:  Current Every Day Smoker    Packs/day: 0.50    Types: Cigarettes  . Smokeless tobacco: Never Used  Substance Use Topics  . Alcohol use: Yes    Comment: couple of beers  . Drug use: No     Allergies   Ceclor [cefaclor]; Ciprofloxacin; Citalopram; and Seroquel [quetiapine fumarate]   Review of Systems Review of Systems  Unable to perform ROS: Acuity of condition     Physical Exam Updated Vital Signs BP (!) 145/92   Pulse (!) 104   Resp 20   Ht 6' (1.829 m)   Wt 108.9 kg (240 lb)   SpO2 99%   BMI 32.55 kg/m   Physical Exam  Constitutional: He appears well-developed and well-nourished. No distress.  HENT:  Head: Normocephalic and atraumatic.  Mouth/Throat: Oropharynx is clear and moist. Mucous membranes are dry.  Eyes: Pupils are equal, round, and reactive to light. Conjunctivae and EOM are normal.  2mm on the right pupil and 3mm on the left pupil which are reactive bilaterally  Neck: Normal range of motion. Neck supple.  Cardiovascular: Regular rhythm and intact distal pulses. Tachycardia present.  No murmur heard. Pulmonary/Chest: Breath sounds normal. Accessory muscle usage present. Tachypnea noted. No respiratory distress. He has no wheezes. He has no rales.  Sonorous breathing and globally decreased breath sounds  Abdominal: Soft. He exhibits no distension. There is no tenderness. There is no rebound and no guarding.  Musculoskeletal: Normal range of motion. He exhibits no edema or tenderness.  Neurological: He is unresponsive.  Will occasionally briefly open eyes but not responding to verbal stimuli or sternal rub  Skin: Skin is warm and dry. No rash noted. No erythema.  Psychiatric:  unresponsive  Nursing note and vitals reviewed.    ED Treatments / Results  Labs (all labs ordered are listed, but only abnormal results are displayed) Labs Reviewed  CBC WITH DIFFERENTIAL/PLATELET - Abnormal; Notable for the following components:      Result Value   WBC  11.9 (*)    Hemoglobin 17.6 (*)    MCV 109.6 (*)    MCH 37.5 (*)    RDW 18.4 (*)    Neutro Abs 7.9 (*)    All other components within normal limits  COMPREHENSIVE METABOLIC PANEL - Abnormal; Notable for the following components:   Glucose, Bld 178 (*)    Total Protein 8.6 (*)    AST 96 (*)    ALT 75 (*)    All other components within normal limits  RAPID URINE DRUG SCREEN, HOSP PERFORMED - Abnormal; Notable for the following components:   Benzodiazepines POSITIVE (*)    All other components within normal limits  AMMONIA -  Abnormal; Notable for the following components:   Ammonia 41 (*)    All other components within normal limits  I-STAT CG4 LACTIC ACID, ED - Abnormal; Notable for the following components:   Lactic Acid, Venous 3.58 (*)    All other components within normal limits  ETHANOL  ACETAMINOPHEN LEVEL  SALICYLATE LEVEL  I-STAT TROPONIN, ED    EKG EKG Interpretation  Date/Time:  Sunday January 01 2018 13:38:41 EDT Ventricular Rate:  98 PR Interval:    QRS Duration: 101 QT Interval:  372 QTC Calculation: 475 R Axis:   3 Text Interpretation:  Sinus rhythm Abnormal R-wave progression, early transition No previous tracing Confirmed by Demika Langenderfer ([id number]) on 01/01/2018 2:03:08 PM   Radiology Dg Chest Port 1 View  Result Date: 01/01/2018 CLINICAL DATA:  Pt unresponsive. Pt comes from [facility]. Pt was found unresponsive in hotel room by house cleaning and did not call 911 until about an hour later. Pt was found face down with ETOH bottles all over room. Smoker. EXAM: PORTABLE CHEST 1 VIEW COMPARISON:  09/01/2013 FINDINGS: Lung volumes are low. There is hazy opacity at the right lung base that is likely atelectasis. Pneumonia is not excluded but felt less likely. Remainder of the lungs is clear. No evidence of pulmonary edema. No pneumothorax. Cardiac silhouette is normal in size. No mediastinal or hilar masses. Skeletal structures are grossly intact. IMPRESSION:  Mild right lung base opacity accentuated by low lung volumes. This is most likely atelectasis. Consider pneumonia or aspiration pneumonitis in the proper clinical setting. Electronically Signed   By: David  Ormond M.D.   On: 01/01/2018 13:59    Procedures Procedures (including critical care time)  Medications Ordered in ED Medications  sodium chloride 0.9 % bolus 1,000 mL (1,000 mLs Intravenous New Bag/Given 01/01/18 1422)  sodium chloride 0.9 % bolus 1,000 mL (has no administration in time range)     Initial Impression / Assessment and Plan / ED Course  I have reviewed the triage vital signs and the nursing notes.  Pertinent labs & imaging results that were available during my care of the patient were reviewed by me and considered in my medical decision making (see chart for details).     61  year old male presenting today after being found unresponsive in his hotel room.  Patient did have significant amount of alcohol bottles in the room and concern for severe intoxication.  Patient is breathing on his own with oxygen saturation remaining at 99-100 with a nasal trumpet.  He will briefly open his eyes but is otherwise unresponsive.  His pupils are reactive but the right is smaller than the left.  Patient is not following commands at this time.  He is mildly tachycardic but breath sounds are clear other than being diminished.  Based on prior charts patient has a psychiatric history with recent IVC but no known lung disease.  No note or anything in his room to suggest this was a suicide attempt.  CBC, CMP, ammonia, EtOH, lactate, troponin, chest x-ray, head CT, EKG pending. UDS is positive for benzodiazepines, troponin is within normal limits, lactic acid is 3.58 and patient was given 2 L of fluid.  Chest x-ray with atelectasis, CBC with mild leukocytosis of 11 but otherwise stable.  Head CT and ammonia pending  Final Clinical Impressions(s) / ED Diagnoses   Final diagnoses:  None    ED  Discharge Orders    None       Gwyneth Sprout, MD 01/01/18 1620

## 2018-01-01 NOTE — H&P (Signed)
PULMONARY / CRITICAL CARE MEDICINE   Name: Cory Foster. MRN: [id number] DOB: 11-25-1955    ADMISSION DATE:  01/01/2018 CONSULTATION DATE: 01/01/18  REFERRING MD: Dr Fayrene Fearing (ER)  CHIEF COMPLAINT: Respiratory failure, Etoh overdose  HISTORY OF PRESENT ILLNESS:   61yoM with hx DM, HTN, Depression, Etoh abuse, Polysubstance abuse, GERD, Psoriasis, who lives in a hotel and was found this AM unresponsive by housekeeping in his hotel room. Reportedly he was slumped over a desk and had soiled himself. The housekeeping left the room without intervention. They came back 2 hours later and found him still unresponsive so called 911. He was brought to the ER where he was found to be unresponsive or minimally responsive, opening eyes only to pain, documented as tachycardic and tachypneic with increased work of breathing, no gag. Patient monitored in ER from 1:30pm until 10pm at which time patient required intubation for acute hypercapneic respiratory failure and failure to protect his airway. At time of my exam patient is intubated/sedated and no family members are present in the ER.   PAST MEDICAL HISTORY :  He  has a past medical history of Anxiety, DDD (degenerative disc disease), Depression, GERD (gastroesophageal reflux disease), Hyperlipidemia, Hypertension, Psoriasis, Substance abuse (HCC), Substance induced mood disorder (HCC), and Varicose vein.  PAST SURGICAL HISTORY: He  has a past surgical history that includes Vein ligation and stripping; Cholecystectomy; and Sinusotomy.  Allergies  Allergen Reactions  . Ceclor [Cefaclor]     unknown  . Ciprofloxacin     unknown  . Citalopram     Hyper   . Seroquel [Quetiapine Fumarate]     "hung over" feeling    No current facility-administered medications on file prior to encounter.    Current Outpatient Medications on File Prior to Encounter  Medication Sig  . atorvastatin (LIPITOR) 80 MG tablet Take 80 mg by mouth daily.   .  Cholecalciferol (VITAMIN D-3) 5000 UNITS TABS Take 5,000 Units by mouth daily.   . clonazePAM (KLONOPIN) 2 MG tablet Take 1 tablet (2 mg total) by mouth 2 (two) times daily.  . fluticasone (FLONASE) 50 MCG/ACT nasal spray Place 1 spray into both nostrils daily as needed for allergies or rhinitis.  Marland Kitchen insulin glargine (LANTUS) 100 UNIT/ML injection Inject 15 Units into the skin at bedtime.  . Magnesium 250 MG TABS Take 250 mg by mouth daily.   . metFORMIN (GLUCOPHAGE) 500 MG tablet Take 1,000 mg by mouth 2 (two) times daily with a meal.   . Multiple Vitamin (MULTIVITAMIN WITH MINERALS) TABS tablet Take 1 tablet by mouth every morning.  . sitaGLIPtin (JANUVIA) 50 MG tablet Take 50 mg by mouth daily.  . sertraline (ZOLOFT) 100 MG tablet Take 1 tablet (100 mg total) by mouth 2 (two) times daily. For depression/anxiety (Patient not taking: Reported on 01/01/2018)   FAMILY HISTORY:  His indicated that his mother is deceased. He indicated that his father is deceased.  SOCIAL HISTORY: He  reports that he has been smoking cigarettes.  He has been smoking about 0.50 packs per day. He has never used smokeless tobacco. He reports that he drinks alcohol. He reports that he does not use drugs.  REVIEW OF SYSTEMS:   Review of Systems  Unable to perform ROS: Critical illness   SUBJECTIVE:  Intubated and Sedated   VITAL SIGNS: BP 115/85 (BP Location: Right Arm)   Pulse 92   Temp (!) 95.7 F (35.4 C) (Bladder)   Resp (!) 21   Ht  6' (1.829 m)   Wt 108.9 kg (240 lb)   SpO2 97%   BMI 32.55 kg/m   VENTILATOR SETTINGS: Vent Mode: PRVC FiO2 (%):  [50 %-100 %] 50 % Set Rate:  [20 bmp-24 bmp] 24 bmp Vt Set:  [540[620 mL] 620 mL PEEP:  [5 cmH20] 5 cmH20 Plateau Pressure:  [20 cmH20] 20 cmH20  INTAKE / OUTPUT: I/O last 3 completed shifts: In: 2000 [IV Piggyback:2000] Out: -   PHYSICAL EXAMINATION: General: WDWN Adult male, Intubated and Sedated, Critically ill Neuro: PERRL, No response to sternal rub  (is on propofol) HEENT: OP clear, MM moist, Orally intubated, NG tube present; some blood present around mouth from traumatic intubation Cardiovascular: RRR no m/r/g Lungs: CTA b/l Abdomen: Soft NTND, BS+ Musculoskeletal: no LE edema Skin: no rashes; erythema "sunburned appearance" on face and chest   LABS:  BMET Recent Labs  Lab 01/01/18 1340  NA 141  K 4.7  CL 104  CO2 22  BUN 7  CREATININE 1.06  GLUCOSE 178*   Electrolytes Recent Labs  Lab 01/01/18 1340  CALCIUM 9.0   CBC Recent Labs  Lab 01/01/18 1340  WBC 11.9*  HGB 17.6*  HCT 51.4  PLT 325   Coag's No results for input(s): APTT, INR in the last 168 hours.  Sepsis Markers Recent Labs  Lab 01/01/18 1426 01/01/18 1957  LATICACIDVEN 3.58* 2.5*   ABG Recent Labs  Lab 01/01/18 2045 01/01/18 2215  PHART 7.212* 7.225*  PCO2ART 60.9* 52.8*  PO2ART 131* 292*   Liver Enzymes Recent Labs  Lab 01/01/18 1340  AST 96*  ALT 75*  ALKPHOS 79  BILITOT 0.6  ALBUMIN 4.0   Cardiac Enzymes No results for input(s): TROPONINI, PROBNP in the last 168 hours.  Glucose No results for input(s): GLUCAP in the last 168 hours.  Imaging Dg Abdomen 1 View  Result Date: 01/01/2018 CLINICAL DATA:  NG tube placement EXAM: ABDOMEN - 1 VIEW COMPARISON:  None. FINDINGS: Enteric tube tip is in the medial left upper quadrant with proximal side hole projected at or just above the EG junction. Advancement is suggested if placement in the stomach is desired. Surgical clips in the right upper quadrant. Postoperative changes in the lower lumbar spine. Mildly dilated gas-filled central small-bowel with suggestion of small bowel fold thickening. This could indicate early/partial obstruction or enteritis. No radiopaque stones. IMPRESSION: Enteric tube tip projects over the upper stomach with proximal side hole likely above the EG junction. Advancement is suggested. Mildly dilated gas-filled small bowel with suggestion of fold thickening.  This could indicate early/partial obstruction or enteritis. Electronically Signed   By: Burman NievesWilliam  Stevens M.D.   On: 01/01/2018 22:31   Ct Head Wo Contrast  Result Date: 01/01/2018 CLINICAL DATA:  Found unresponsive.  Intubated. EXAM: CT HEAD WITHOUT CONTRAST TECHNIQUE: Contiguous axial images were obtained from the base of the skull through the vertex without intravenous contrast. COMPARISON:  None. FINDINGS: Brain: Moderate generalized atrophy. No evidence of old or acute focal infarction, mass lesion, hemorrhage, hydrocephalus or extra-axial collection. Vascular: No abnormal vascular finding. Skull: Negative Sinuses/Orbits: Clear/normal Other: None IMPRESSION: No acute finding by CT.  Moderate atrophy. Electronically Signed   By: Paulina FusiMark  Shogry M.D.   On: 01/01/2018 14:51   Dg Chest Portable 1 View  Result Date: 01/01/2018 CLINICAL DATA:  Endotracheal tube placement EXAM: PORTABLE CHEST 1 VIEW COMPARISON:  01/01/2018 FINDINGS: Endotracheal tube placed with tip measuring 4.7 cm above the carina. Enteric tube tip projects over the left  upper quadrant, likely in the upper stomach. Shallow inspiration with atelectasis in the lung bases. Blunting of the costophrenic angles suggesting small effusions. Heart size is normal for technique. No pneumothorax. Calcification of the aorta. IMPRESSION: Endotracheal tube appears in satisfactory position. Shallow inspiration with atelectasis in the lung bases. Aortic atherosclerosis. Electronically Signed   By: Burman Nieves M.D.   On: 01/01/2018 22:32   Dg Chest Port 1 View  Result Date: 01/01/2018 CLINICAL DATA:  Pt unresponsive. Pt comes from [facility]. Pt was found unresponsive in hotel room by house cleaning and did not call 911 until about an hour later. Pt was found face down with ETOH bottles all over room. Smoker. EXAM: PORTABLE CHEST 1 VIEW COMPARISON:  09/01/2013 FINDINGS: Lung volumes are low. There is hazy opacity at the right lung base that is likely  atelectasis. Pneumonia is not excluded but felt less likely. Remainder of the lungs is clear. No evidence of pulmonary edema. No pneumothorax. Cardiac silhouette is normal in size. No mediastinal or hilar masses. Skeletal structures are grossly intact. IMPRESSION: Mild right lung base opacity accentuated by low lung volumes. This is most likely atelectasis. Consider pneumonia or aspiration pneumonitis in the proper clinical setting. Electronically Signed   By: Amie Portland M.D.   On: 01/01/2018 13:59   CULTURES: Sputum culture: ordered UA: ordered Blood cultures: ordered  ANTIBIOTICS: Zosyn 4/7 >>  SIGNIFICANT EVENTS: 4/7: found down in hotel room, Etoh overdose, brought to ER where required intubation for hypercapnea and failure to protect airway  LINES/TUBES: PIV's ETT 4/7 >> Foley catheter 4/7 >> NG tube 4/7 >>  DISCUSSION: 61yoM with hx DM, HTN, Depression, Etoh abuse, Polysubstance abuse, GERD, Psoriasis, presents to ER when found unresponsive, Etoh overdose, with Acute hypercapneic respiratory failure requiring intubation and mechanical ventilation.   ASSESSMENT / PLAN:  PULMONARY 1. Acute hypercapneic Respiratory failure; Aspiration pneumonia: - ABG prior to intubation showed acute hypercapnea; repeat ABG post intubation continued to show acute hypercapnea minimally improved. Increased RR from 20 to 24. Repeat ABG in 2 hours.  - Continue mechanical ventilation - CXR on my review shows bibasilar infiltrates that could be consistent with atelectasis vs pna; given that the infiltrates persisted post-intubation, I feel they are less likely representative of atelectasis. The prolonged downtime and not protecting airway is high risk for aspiration; therefore this is likely representative of an aspiration pneumonia. - Obtain sputum culture; start Zosyn.  CARDIOVASCULAR 1. Hx HTN: - hold home antihypertensives as BP is currently soft post-intubation  RENAL No active issues    GASTROINTESTINAL 1. Hx GERD: - NPO; GI prophylaxis  HEMATOLOGIC No active issues; DVT prophylaxis  INFECTIOUS 1. Severe sepsis due to Aspiration pneumonia: - lactate initially 3.58, improved to 2.5 following 2L IVF bolus. Will give an additional 1L IVF to complete his 30cc/kg bolus. - continue to trend lactate. Check procalcitonin. Panculture - Start zosyn.   ENDOCRINE 1. Hx DM: - NPO; SSI  NEUROLOGIC 1. Acute Encephalopathy; Etoh overdose; Benzodiazepine overdose: - Head CT showed no acute process - UDS: positive for benzos - Etoh level 424 - APAP and Salicylate negative - Start folate, thiamine, multivitamin; monitor for signs of Etoh withdrawal with CIWA  FAMILY  - Updates: no family present in ER at time of my exam.  - Inter-disciplinary family meet or Palliative Care meeting due by: 01/07/18  60 minutes critical care time  Milana Obey, MD  Pulmonary and Critical Care Medicine [facility] Pager: [phone]  01/01/2018,  10:42 PM

## 2018-01-01 NOTE — ED Triage Notes (Signed)
Pt was brought in VIA [facility]. [facility] comes [facility]. Pt brought in without any clothes. Pt was found Unresponsive in hotel room by house cleaning and did not call 911 until about an hour later. Pt was found face down with ETOH bottles all over room. Pt was sating at 81%. NPA and Non-re-breather and sating at 98% now. Pt only opens eyes to pain. Pt does have wallet in room.

## 2018-01-01 NOTE — ED Notes (Signed)
Pt attempting to pull at ETT and sit up. Fentanyl Drip adjusted. Versed to be administered for agitation.

## 2018-01-01 NOTE — ED Notes (Signed)
Dr.James notified of neurological assessment. Order for Narcan to be placed.

## 2018-01-02 ENCOUNTER — Inpatient Hospital Stay (HOSPITAL_COMMUNITY): Payer: Medicare Other

## 2018-01-02 DIAGNOSIS — F332 Major depressive disorder, recurrent severe without psychotic features: Secondary | ICD-10-CM

## 2018-01-02 LAB — URINALYSIS, ROUTINE W REFLEX MICROSCOPIC
Bilirubin Urine: NEGATIVE
Glucose, UA: NEGATIVE mg/dL
KETONES UR: NEGATIVE mg/dL
Leukocytes, UA: NEGATIVE
Nitrite: NEGATIVE
Specific Gravity, Urine: >1.03 — ABNORMAL HIGH (ref 1.005–1.030)
pH: 5.5 (ref 5.0–8.0)

## 2018-01-02 LAB — MAGNESIUM
MAGNESIUM: 1.2 mg/dL — AB (ref 1.7–2.4)
Magnesium: 1.2 mg/dL — ABNORMAL LOW (ref 1.7–2.4)

## 2018-01-02 LAB — GLUCOSE, CAPILLARY
GLUCOSE-CAPILLARY: 165 mg/dL — AB (ref 65–99)
GLUCOSE-CAPILLARY: 184 mg/dL — AB (ref 65–99)
GLUCOSE-CAPILLARY: 172 mg/dL — AB (ref 65–99)
Glucose-Capillary: 122 mg/dL — ABNORMAL HIGH (ref 65–99)
Glucose-Capillary: 111 mg/dL — ABNORMAL HIGH (ref 65–99)
Glucose-Capillary: 192 mg/dL — ABNORMAL HIGH (ref 65–99)

## 2018-01-02 LAB — PHOSPHORUS
PHOSPHORUS: 1.9 mg/dL — AB (ref 2.5–4.6)
Phosphorus: 2.9 mg/dL (ref 2.5–4.6)

## 2018-01-02 LAB — CK TOTAL AND CKMB (NOT AT ARMC)
CK, MB: 2.3 ng/mL (ref 0.5–5.0)
RELATIVE INDEX: INVALID (ref 0.0–2.5)
Total CK: 52 U/L (ref 49–397)

## 2018-01-02 LAB — BLOOD GAS, ARTERIAL
ACID-BASE DEFICIT: 3.6 mmol/L — AB (ref 0.0–2.0)
BICARBONATE: 21.2 mmol/L (ref 20.0–28.0)
DRAWN BY: 232811
FIO2: 50
LHR: 24 {breaths}/min
O2 SAT: 92.8 %
PEEP/CPAP: 5 cmH2O
PH ART: 7.342 — AB (ref 7.350–7.450)
Patient temperature: 37.3
VT: 620 mL
pCO2 arterial: 40.3 mmHg (ref 32.0–48.0)
pO2, Arterial: 73.7 mmHg — ABNORMAL LOW (ref 83.0–108.0)

## 2018-01-02 LAB — LACTIC ACID, PLASMA
Lactic Acid, Venous: 3.2 mmol/L (ref 0.5–1.9)
Lactic Acid, Venous: 4.1 mmol/L (ref 0.5–1.9)
Lactic Acid, Venous: 2.1 mmol/L (ref 0.5–1.9)

## 2018-01-02 LAB — BASIC METABOLIC PANEL
Anion gap: 11 (ref 5–15)
BUN: 7 mg/dL (ref 6–20)
CALCIUM: 7.9 mg/dL — AB (ref 8.9–10.3)
CO2: 21 mmol/L — AB (ref 22–32)
Chloride: 113 mmol/L — ABNORMAL HIGH (ref 101–111)
Creatinine, Ser: 0.89 mg/dL (ref 0.61–1.24)
GFR calc Af Amer: >60 mL/min (ref >60)
GFR calc non Af Amer: >60 mL/min (ref >60)
GLUCOSE: 141 mg/dL — AB (ref 65–99)
Potassium: 4.5 mmol/L (ref 3.5–5.1)
Sodium: 145 mmol/L (ref 135–145)

## 2018-01-02 LAB — PROCALCITONIN: Procalcitonin: <0.1 ng/mL

## 2018-01-02 LAB — HIV ANTIBODY (ROUTINE TESTING W REFLEX): HIV Screen 4th Generation wRfx: NONREACTIVE

## 2018-01-02 LAB — MRSA PCR SCREENING: MRSA by PCR: NEGATIVE

## 2018-01-02 LAB — TRIGLYCERIDES: Triglycerides: 309 mg/dL — ABNORMAL HIGH (ref <150)

## 2018-01-02 MED ORDER — ATORVASTATIN CALCIUM 40 MG PO TABS: 80.0000 mg | ORAL_TABLET | Freq: Every day | ORAL | Status: DC

## 2018-01-02 MED ORDER — ACETAMINOPHEN 325 MG PO TABS: 650.0000 mg | ORAL_TABLET | Freq: Four times a day (QID) | ORAL | Status: DC | PRN

## 2018-01-02 MED ORDER — CLONAZEPAM 1 MG PO TABS: 2.0000 mg | ORAL_TABLET | Freq: Two times a day (BID) | ORAL | Status: DC

## 2018-01-02 MED ORDER — FOLIC ACID 1 MG PO TABS: 1.0000 mg | ORAL_TABLET | Freq: Every day | ORAL | Status: DC

## 2018-01-02 MED ORDER — INSULIN GLARGINE 100 UNIT/ML ~~LOC~~ SOLN
16.0000 [IU] | Freq: Every day | SUBCUTANEOUS | Status: DC
  Filled 2018-01-02: qty 0.16

## 2018-01-02 MED ORDER — ALUM & MAG HYDROXIDE-SIMETH 200-200-20 MG/5ML PO SUSP: 30.0000 mL | ORAL | Status: DC | PRN

## 2018-01-02 MED ORDER — TRAZODONE HCL 50 MG PO TABS: 100.0000 mg | ORAL_TABLET | Freq: Every evening | ORAL | Status: DC | PRN

## 2018-01-02 MED ORDER — VITAL HIGH PROTEIN PO LIQD
1000.0000 mL | ORAL | Status: DC
  Administered 2018-01-02 – 2018-01-03 (×2): 1000 mL
  Filled 2018-01-02 (×4): qty 1000

## 2018-01-02 MED ORDER — MAGNESIUM HYDROXIDE 400 MG/5ML PO SUSP
30.0000 mL | Freq: Every day | ORAL | Status: DC | PRN
Start: 2018-01-02 — End: 2018-01-02

## 2018-01-02 MED ORDER — SODIUM CHLORIDE 0.9 % IV BOLUS
1000.0000 mL | Freq: Once | INTRAVENOUS | Status: AC
Start: 2018-01-02 — End: 2018-01-02
  Administered 2018-01-02: 1000 mL via INTRAVENOUS

## 2018-01-02 MED ORDER — PRO-STAT SUGAR FREE PO LIQD
60.0000 mL | Freq: Two times a day (BID) | ORAL | Status: DC
  Administered 2018-01-02 – 2018-01-03 (×4): 60 mL
  Filled 2018-01-02 (×4): qty 60

## 2018-01-02 MED ORDER — LORAZEPAM 2 MG/ML IJ SOLN: 1.0000 mg | Freq: Four times a day (QID) | INTRAMUSCULAR | Status: DC | PRN

## 2018-01-02 MED ORDER — LORAZEPAM 1 MG PO TABS: 1.0000 mg | ORAL_TABLET | Freq: Four times a day (QID) | ORAL | Status: DC | PRN

## 2018-01-02 MED ORDER — SERTRALINE HCL 100 MG PO TABS: 100.0000 mg | ORAL_TABLET | Freq: Two times a day (BID) | ORAL | Status: DC

## 2018-01-02 MED ORDER — PIPERACILLIN-TAZOBACTAM 3.375 G IVPB
3.3750 g | Freq: Three times a day (TID) | INTRAVENOUS | Status: DC
  Administered 2018-01-02 – 2018-01-03 (×4): 3.375 g via INTRAVENOUS
  Filled 2018-01-02 (×3): qty 50

## 2018-01-02 MED ORDER — SODIUM CHLORIDE 0.9 % IV BOLUS
1000.0000 mL | Freq: Once | INTRAVENOUS | Status: AC
  Administered 2018-01-02: 1000 mL via INTRAVENOUS

## 2018-01-02 MED ORDER — ADULT MULTIVITAMIN W/MINERALS CH: 1.0000 | ORAL_TABLET | Freq: Every day | ORAL | Status: DC

## 2018-01-02 MED ORDER — METFORMIN HCL 500 MG PO TABS
1000.0000 mg | ORAL_TABLET | Freq: Two times a day (BID) | ORAL | Status: DC
  Filled 2018-01-02: qty 2

## 2018-01-02 MED ORDER — LINAGLIPTIN 5 MG PO TABS: 5.0000 mg | ORAL_TABLET | Freq: Every day | ORAL | Status: DC

## 2018-01-02 NOTE — Progress Notes (Signed)
CRITICAL VALUE ALERT  Critical Value:  Lactic acid 2.1  Date & Time Notied:  01/02/18 1530  Pt lactic acid level has decreased from 4.1 to 2.1.

## 2018-01-02 NOTE — Progress Notes (Signed)
Note from Psychiatry counselor mentioning a consult. Neither I nor the ER MD asked for a psychiatry consult. Do not see a full consult note from psychiatry on this patient but see many new orders from psychiatry staff. We do not desire a psychiatry consult at this time. Will d/c orders that have been placed.

## 2018-01-02 NOTE — Progress Notes (Signed)
eLink Physician-Brief Progress Note Patient Name: Cory MoralesRandall M Rund Jr. DOB: [birth year] MRN: [id number]   Date of Service  01/02/2018  HPI/Events of Note  Lactic Acid = 2.5 --> 3.2 --> 4.1. Hgb = 17.6. Suspect that the patient may be intravascularly dry.   eICU Interventions  Will bolus with 0.9 NaCl 1 liter IC over 1 hour now.      Intervention Category Major Interventions: Acid-Base disturbance - evaluation and management  Sommer,Steven Eugene 01/02/2018, 6:05 AM

## 2018-01-02 NOTE — Progress Notes (Signed)
TTS consult ordered for pt who is in ICU. TTS notified EDP Dr. Blinda LeatherwoodPollina, MD that a psych consult will need to be ordered instead of a TTS consult. EDP states he will remove the TTS and order a psych consult.  Princess BruinsAquicha Viraat Vanpatten, MSW, LCSW Therapeutic Triage Specialist  [phone]

## 2018-01-02 NOTE — Progress Notes (Signed)
PULMONARY / CRITICAL CARE MEDICINE   Name: Cory Foster. MRN: [id number] DOB: 25-Jul-1956    ADMISSION DATE:  01/01/2018 CONSULTATION DATE:  01/01/2018  REFERRING MD:  Dr. Fayrene Fearing  CHIEF COMPLAINT:  Found down  HISTORY OF PRESENT ILLNESS:   62 y/o male with a history of EtOH abuse and polystubstance abuse was admitted on 4/7 after he was found unresponsive at a hotel. Intubated in the ER.  Has pneumonia.  SUBJECTIVE:  Intubated last night Hypoxemic this morning confused  VITAL SIGNS: BP 106/68   Pulse 93   Temp 99.4 F (37.4 C) (Axillary)   Resp (!) 25   Ht 6' (1.829 m)   Wt 255 lb 15.3 oz (116.1 kg)   SpO2 93%   BMI 34.71 kg/m   HEMODYNAMICS:    VENTILATOR SETTINGS: Vent Mode: PRVC FiO2 (%):  [50 %-100 %] 50 % Set Rate:  [20 bmp-24 bmp] 24 bmp Vt Set:  [409 mL] 620 mL PEEP:  [5 cmH20] 5 cmH20 Plateau Pressure:  [16 cmH20-20 cmH20] 16 cmH20  INTAKE / OUTPUT: I/O last 3 completed shifts: In: 2541.7 [I.V.:541.7; IV Piggyback:2000] Out: -   PHYSICAL EXAMINATION:  General:  In bed on vent HENT: NCAT ETT in place PULM: Rhonchi bilaterally B, vent supported breathing CV: RRR, no mgr GI: BS+, soft, nontender MSK: normal bulk and tone Neuro: sedated on vent    LABS:  BMET Recent Labs  Lab 01/01/18 1340 01/02/18 0021  NA 141 145  K 4.7 4.5  CL 104 113*  CO2 22 21*  BUN 7 7  CREATININE 1.06 0.89  GLUCOSE 178* 141*    Electrolytes Recent Labs  Lab 01/01/18 1340 01/02/18 0021  CALCIUM 9.0 7.9*    CBC Recent Labs  Lab 01/01/18 1340  WBC 11.9*  HGB 17.6*  HCT 51.4  PLT 325    Coag's No results for input(s): APTT, INR in the last 168 hours.  Sepsis Markers Recent Labs  Lab 01/01/18 1957 01/02/18 0021 01/02/18 0033 01/02/18 0334  LATICACIDVEN 2.5*  --  3.2* 4.1*  PROCALCITON  --  <0.10  --   --     ABG Recent Labs  Lab 01/01/18 2045 01/01/18 2215 01/02/18 0020  PHART 7.212* 7.225* 7.342*  PCO2ART 60.9* 52.8* 40.3   PO2ART 131* 292* 73.7*    Liver Enzymes Recent Labs  Lab 01/01/18 1340  AST 96*  ALT 75*  ALKPHOS 79  BILITOT 0.6  ALBUMIN 4.0    Cardiac Enzymes No results for input(s): TROPONINI, PROBNP in the last 168 hours.  Glucose Recent Labs  Lab 01/01/18 2336 01/02/18 0333 01/02/18 0731  GLUCAP 138* 122* 111*    Imaging Dg Abdomen 1 View  Result Date: 01/01/2018 CLINICAL DATA:  NG tube placement EXAM: ABDOMEN - 1 VIEW COMPARISON:  None. FINDINGS: Enteric tube tip is in the medial left upper quadrant with proximal side hole projected at or just above the EG junction. Advancement is suggested if placement in the stomach is desired. Surgical clips in the right upper quadrant. Postoperative changes in the lower lumbar spine. Mildly dilated gas-filled central small-bowel with suggestion of small bowel fold thickening. This could indicate early/partial obstruction or enteritis. No radiopaque stones. IMPRESSION: Enteric tube tip projects over the upper stomach with proximal side hole likely above the EG junction. Advancement is suggested. Mildly dilated gas-filled small bowel with suggestion of fold thickening. This could indicate early/partial obstruction or enteritis. Electronically Signed   By: Marisa Cyphers.D.  On: 01/01/2018 22:31   Ct Head Wo Contrast  Result Date: 01/01/2018 CLINICAL DATA:  Found unresponsive.  Intubated. EXAM: CT HEAD WITHOUT CONTRAST TECHNIQUE: Contiguous axial images were obtained from the base of the skull through the vertex without intravenous contrast. COMPARISON:  None. FINDINGS: Brain: Moderate generalized atrophy. No evidence of old or acute focal infarction, mass lesion, hemorrhage, hydrocephalus or extra-axial collection. Vascular: No abnormal vascular finding. Skull: Negative Sinuses/Orbits: Clear/normal Other: None IMPRESSION: No acute finding by CT.  Moderate atrophy. Electronically Signed   By: Paulina Fusi M.D.   On: 01/01/2018 14:51   Dg Chest Port 1  View  Result Date: 01/02/2018 CLINICAL DATA:  62 y/o  M; respiratory failure. EXAM: PORTABLE CHEST 1 VIEW COMPARISON:  01/01/2018 chest radiograph FINDINGS: Endotracheal tube 5.6 cm from carina. Right lower lobe platelike atelectasis. No new focal consolidation. Stable cardiac silhouette. Calcific atherosclerosis of aorta. Enteric tube tip in proximal stomach. Bones are unremarkable. IMPRESSION: Stable platelike atelectasis in right lung base. No new active disease. Electronically Signed   By: Mitzi Hansen M.D.   On: 01/02/2018 05:47   Dg Chest Portable 1 View  Result Date: 01/01/2018 CLINICAL DATA:  Endotracheal tube placement EXAM: PORTABLE CHEST 1 VIEW COMPARISON:  01/01/2018 FINDINGS: Endotracheal tube placed with tip measuring 4.7 cm above the carina. Enteric tube tip projects over the left upper quadrant, likely in the upper stomach. Shallow inspiration with atelectasis in the lung bases. Blunting of the costophrenic angles suggesting small effusions. Heart size is normal for technique. No pneumothorax. Calcification of the aorta. IMPRESSION: Endotracheal tube appears in satisfactory position. Shallow inspiration with atelectasis in the lung bases. Aortic atherosclerosis. Electronically Signed   By: Burman Nieves M.D.   On: 01/01/2018 22:32   Dg Chest Port 1 View  Result Date: 01/01/2018 CLINICAL DATA:  Pt unresponsive. Pt comes from [employer]. Pt was found unresponsive in hotel room by house cleaning and did not call 911 until about an hour later. Pt was found face down with ETOH bottles all over room. Smoker. EXAM: PORTABLE CHEST 1 VIEW COMPARISON:  09/01/2013 FINDINGS: Lung volumes are low. There is hazy opacity at the right lung base that is likely atelectasis. Pneumonia is not excluded but felt less likely. Remainder of the lungs is clear. No evidence of pulmonary edema. No pneumothorax. Cardiac silhouette is normal in size. No mediastinal or hilar masses. Skeletal structures  are grossly intact. IMPRESSION: Mild right lung base opacity accentuated by low lung volumes. This is most likely atelectasis. Consider pneumonia or aspiration pneumonitis in the proper clinical setting. Electronically Signed   By: Amie Portland M.D.   On: 01/01/2018 13:59     CULTURES: Sputum culture: ordered UA: ordered Blood cultures: ordered  ANTIBIOTICS: Zosyn 4/7 >>  SIGNIFICANT EVENTS: 4/7: found down in hotel room, Etoh overdose, brought to ER where required intubation for hypercapnea and failure to protect airway  LINES/TUBES: PIV's ETT 4/7 >> Foley catheter 4/7 >> NG tube 4/7 >>    DISCUSSION: 62 y/o male with Depression, polysubstance abuse, GERD admitted with acute respiratory failure with hypoxemia in the setting of polysubstance overdose.  Now with severe hypoxemia and pneumonia.  ASSESSMENT / PLAN:  PULMONARY A: Acute respiratory failure with hypoxemia Aspiration pneumonia P:   Full mechanical vent support VAP prevention Daily WUA/SBT See ID  CARDIOVASCULAR A:  No acute issues Rising lactic acid, uncertain etiology as not in shock P:  Tele Monitor hemodynamics Repeat Lactic acid  RENAL A:  No acute issues P:   Monitor BMET and UOP Replace electrolytes as needed   GASTROINTESTINAL A:   No acute issues P:   Start tube feedings Continue ranitidine  HEMATOLOGIC A:   No acute issues P:  Monitor for bleeding  INFECTIOUS A:   Aspiration pneumonia P:   Monitor resp cultures Continue zosyn  ENDOCRINE A:   No acute issues   P:   Monitor glucose  NEUROLOGIC A:   Polysubstance abuse P:   RASS goal: -1 PAD protocol, continue fentanyl infusion, prn versed   FAMILY  - Updates: none bedside  - Inter-disciplinary family meet or Palliative Care meeting due by:  day 7  My cc time 35 minutes  Heber CarolinaBrent Ayo Smoak, MD  PCCM Pager: [phone] Cell: [phone] After 3pm or if no response, call [phone]   01/02/2018, 9:47  AM

## 2018-01-02 NOTE — ED Notes (Signed)
ED TO INPATIENT HANDOFF REPORT  Name/Age/Gender [name]. 62 y.o. male  Code Status    Code Status Orders  (From admission, onward)        Start     Ordered   01/01/18 2229  Full code  Continuous     01/01/18 2233    Code Status History    Date Active Date Inactive Code Status Order ID Comments User Context   08/20/2013 1953 08/21/2013 0152 Full Code [id number]  Ward, Delice Bison, DO ED   08/17/2013 2042 08/18/2013 1815 Full Code [id number]  Montine Circle, PA-C ED   08/07/2013 1909 08/08/2013 0319 Full Code [id number]  Margarita Sermons ED      Home/SNF/Other Home  Chief Complaint etoh - nonresponsive  Level of Care/Admitting Diagnosis ED Disposition    ED Disposition Condition [facility] Area: [facility] [[id number]]  Level of Care: ICU [6]  Diagnosis: Acute respiratory failure with hypercapnia [facility]) [[id number]]  Admitting Physician: Reyne Dumas [[id number]]  Attending Physician: Reyne Dumas [[id number]]  Estimated length of stay: 3 - 4 days  Certification:: I certify this patient will need inpatient services for at least 2 midnights  PT Class (Do Not Modify): Inpatient [101]  PT Acc Code (Do Not Modify): Private [1]       Medical History Past Medical History:  Diagnosis Date  . Anxiety   . DDD (degenerative disc disease)   . Depression   . GERD (gastroesophageal reflux disease)   . Hyperlipidemia   . Hypertension   . Psoriasis   . Substance abuse ([place])   . Substance induced mood disorder ([place])   . Varicose vein     Allergies Allergies  Allergen Reactions  . Ceclor [Cefaclor]     unknown  . Ciprofloxacin     unknown  . Citalopram     Hyper   . Seroquel [Quetiapine Fumarate]     "hung over" feeling    IV Location/Drains/Wounds Patient Lines/Drains/Airways Status   Active Line/Drains/Airways    Name:   Placement date:   Placement time:   Site:   Days:   Peripheral IV 01/01/18 Left  Antecubital   01/01/18    1342    Antecubital   1   Peripheral IV 01/01/18 Right Hand   01/01/18    2037    Hand   1   Peripheral IV 01/02/18 Right Antecubital   01/02/18    0020    Antecubital   less than 1   NG/OG Tube Nasogastric 18 Fr. Left nare Aucultation;Xray   01/01/18    2154    Left nare   1   Urethral Catheter Ellowyn Rieves C. RN Temperature probe 14 Fr.   01/01/18    2230    Temperature probe   1   Airway 7.5 mm   01/01/18    2143     1          Labs/Imaging Results for orders placed or performed during the hospital encounter of 01/01/18 (from the past 48 hour(s))  CBC with Differential/Platelet     Status: Abnormal   Collection Time: 01/01/18  1:40 PM  Result Value Ref Range   WBC 11.9 (H) 4.0 - 10.5 K/uL   RBC 4.69 4.22 - 5.81 MIL/uL   Hemoglobin 17.6 (H) 13.0 - 17.0 g/dL   HCT 51.4 39.0 - 52.0 %   MCV 109.6 (H) 78.0 - 100.0 fL   MCH 37.5 (  H) 26.0 - 34.0 pg   MCHC 34.2 30.0 - 36.0 g/dL   RDW 18.4 (H) 11.5 - 15.5 %   Platelets 325 150 - 400 K/uL   Neutrophils Relative % 66 %   Neutro Abs 7.9 (H) 1.7 - 7.7 K/uL   Lymphocytes Relative 25 %   Lymphs Abs 2.9 0.7 - 4.0 K/uL   Monocytes Relative 6 %   Monocytes Absolute 0.7 0.1 - 1.0 K/uL   Eosinophils Relative 2 %   Eosinophils Absolute 0.3 0.0 - 0.7 K/uL   Basophils Relative 1 %   Basophils Absolute 0.1 0.0 - 0.1 K/uL    Comment: Performed at [facility], [place] [name] [place], [place] [address]  Comprehensive metabolic panel     Status: Abnormal   Collection Time: 01/01/18  1:40 PM  Result Value Ref Range   Sodium 141 135 - 145 mmol/L   Potassium 4.7 3.5 - 5.1 mmol/L   Chloride 104 101 - 111 mmol/L   CO2 22 22 - 32 mmol/L   Glucose, Bld 178 (H) 65 - 99 mg/dL   BUN 7 6 - 20 mg/dL   Creatinine, Ser 1.06 0.61 - 1.24 mg/dL   Calcium 9.0 8.9 - 10.3 mg/dL   Total Protein 8.6 (H) 6.5 - 8.1 g/dL   Albumin 4.0 3.5 - 5.0 g/dL   AST 96 (H) 15 - 41 U/L   ALT 75 (H) 17 - 63 U/L   Alkaline Phosphatase 79  38 - 126 U/L   Total Bilirubin 0.6 0.3 - 1.2 mg/dL   GFR calc non Af Amer >60 >60 mL/min   GFR calc Af Amer >60 >60 mL/min    Comment: (NOTE) The eGFR has been calculated using the CKD EPI equation. This calculation has not been validated in all clinical situations. eGFR's persistently <60 mL/min signify possible Chronic Kidney Disease.    Anion gap 15 5 - 15    Comment: Performed at [facility], [place] [address] [place], [place] [address]  Ethanol     Status: Abnormal   Collection Time: 01/01/18  1:40 PM  Result Value Ref Range   Alcohol, Ethyl (B) 424 (HH) <10 mg/dL    Comment:        LOWEST DETECTABLE LIMIT FOR SERUM ALCOHOL IS 10 mg/dL FOR MEDICAL PURPOSES ONLY CRITICAL RESULT CALLED TO, READ BACK BY AND VERIFIED WITH: BARHAM,R RN [id number] COVINGTON,N Performed at [place] [address] [place], [place] [address]   Rapid urine drug screen (hospital performed)     Status: Abnormal   Collection Time: 01/01/18  1:40 PM  Result Value Ref Range   Opiates NONE DETECTED NONE DETECTED   Cocaine NONE DETECTED NONE DETECTED   Benzodiazepines POSITIVE (A) NONE DETECTED   Amphetamines NONE DETECTED NONE DETECTED   Tetrahydrocannabinol NONE DETECTED NONE DETECTED   Barbiturates NONE DETECTED NONE DETECTED    Comment: (NOTE) DRUG SCREEN FOR MEDICAL PURPOSES ONLY.  IF CONFIRMATION IS NEEDED FOR ANY PURPOSE, NOTIFY LAB WITHIN 5 DAYS. LOWEST DETECTABLE LIMITS FOR URINE DRUG SCREEN Drug Class                     Cutoff (ng/mL) Amphetamine and metabolites    1000 Barbiturate and metabolites    200 Benzodiazepine                 802 Tricyclics and metabolites     300 Opiates and metabolites  300 Cocaine and metabolites        300 THC                            50 Performed at [facility], [place] [address], [place], [place] [address]   Acetaminophen level     Status: Abnormal   Collection Time: 01/01/18  1:40  PM  Result Value Ref Range   Acetaminophen (Tylenol), Serum <10 (L) 10 - 30 ug/mL    Comment:        THERAPEUTIC CONCENTRATIONS VARY SIGNIFICANTLY. A RANGE OF 10-30 ug/mL MAY BE AN EFFECTIVE CONCENTRATION FOR MANY PATIENTS. HOWEVER, SOME ARE BEST TREATED AT CONCENTRATIONS OUTSIDE THIS RANGE. ACETAMINOPHEN CONCENTRATIONS >150 ug/mL AT 4 HOURS AFTER INGESTION AND >50 ug/mL AT 12 HOURS AFTER INGESTION ARE OFTEN ASSOCIATED WITH TOXIC REACTIONS. Performed at [facility], [place] [address], [place], [place] [address]   Salicylate level     Status: None   Collection Time: 01/01/18  1:40 PM  Result Value Ref Range   Salicylate Lvl <8.6 2.8 - 30.0 mg/dL    Comment: Performed at [facility], [place] [address], [place], [place] [address]  Ammonia     Status: Abnormal   Collection Time: 01/01/18  1:40 PM  Result Value Ref Range   Ammonia 41 (H) 9 - 35 umol/L    Comment: Performed at [facility], [place] [address], [place], [place] [address]  I-stat troponin, ED     Status: None   Collection Time: 01/01/18  2:24 PM  Result Value Ref Range   Troponin i, poc 0.01 0.00 - 0.08 ng/mL   Comment 3            Comment: Due to the release kinetics of cTnI, a negative result within the first hours of the onset of symptoms does not rule out myocardial infarction with certainty. If myocardial infarction is still suspected, repeat the test at appropriate intervals.   I-Stat CG4 Lactic Acid, ED     Status: Abnormal   Collection Time: 01/01/18  2:26 PM  Result Value Ref Range   Lactic Acid, Venous 3.58 (HH) 0.5 - 1.9 mmol/L   Comment NOTIFIED PHYSICIAN   Lactic acid, plasma     Status: Abnormal   Collection Time: 01/01/18  7:57 PM  Result Value Ref Range   Lactic Acid, Venous 2.5 (HH) 0.5 - 1.9 mmol/L    Comment: CRITICAL RESULT CALLED TO, READ BACK BY AND VERIFIED WITH: TAKINGTON,J AT 2120 ON 01/01/18 BY MOHAMED,A Performed at [facility], [place] [address] [place], [place] [address]   Blood gas, arterial     Status: Abnormal   Collection Time: 01/01/18  8:45 PM  Result Value Ref Range   O2 Content 15.0 L/min   Delivery systems NON-REBREATHER OXYGEN MASK    pH, Arterial 7.212 (L) 7.350 - 7.450   pCO2 arterial 60.9 (H) 32.0 - 48.0 mmHg   pO2, Arterial 131 (H) 83.0 - 108.0 mmHg   Bicarbonate 23.5 20.0 - 28.0 mmol/L   Acid-base deficit 5.4 (H) 0.0 - 2.0 mmol/L   O2 Saturation 97.8 %   Patient temperature 37.0    Collection site ARTERIAL    Drawn by DR Jeneen Rinks    Sample type ARTERIAL    Allens test (pass/fail) PASS PASS    Comment: Performed at [facility] [address], [place],  [address]  CK total and CKMB (cardiac)not at [facility]     Status: None   Collection Time: 01/01/18  8:54 PM  Result Value Ref Range   Total CK 52 49 - 397 U/L   CK, MB 2.3 0.5 - 5.0 ng/mL   Relative Index RELATIVE INDEX IS INVALID 0.0 - 2.5    Comment: WHEN CK < 100 U/L        Performed at [facility] [address] [place], [place] [address]   Blood gas, arterial     Status: Abnormal   Collection Time: 01/01/18 10:15 PM  Result Value Ref Range   FIO2 100.00    Delivery systems VENTILATOR    Mode PRESSURE REGULATED VOLUME CONTROL    VT 620 mL   LHR 20 resp/min   Peep/cpap 5.0 cm H20   pH, Arterial 7.225 (L) 7.350 - 7.450   pCO2 arterial 52.8 (H) 32.0 - 48.0 mmHg   pO2, Arterial 292 (H) 83.0 - 108.0 mmHg   Bicarbonate 21.0 20.0 - 28.0 mmol/L   Acid-base deficit 6.9 (H) 0.0 - 2.0 mmol/L   O2 Saturation 99.2 %   Patient temperature 99.0    Collection site RIGHT RADIAL    Drawn by [id number]    Sample type ARTERIAL    Allens test (pass/fail) PASS PASS    Comment: Performed at [facility], [place] [address] [place], [place] [address]  CBG monitoring, ED     Status: Abnormal   Collection Time: 01/01/18 11:36 PM  Result Value Ref Range   Glucose-Capillary 138 (H) 65 -  99 mg/dL  Blood gas, arterial     Status: Abnormal   Collection Time: 01/02/18 12:20 AM  Result Value Ref Range   FIO2 50.00    Delivery systems VENTILATOR    Mode PRESSURE REGULATED VOLUME CONTROL    VT 620 mL   LHR 24 resp/min   Peep/cpap 5.0 cm H20   pH, Arterial 7.342 (L) 7.350 - 7.450   pCO2 arterial 40.3 32.0 - 48.0 mmHg   pO2, Arterial 73.7 (L) 83.0 - 108.0 mmHg   Bicarbonate 21.2 20.0 - 28.0 mmol/L   Acid-base deficit 3.6 (H) 0.0 - 2.0 mmol/L   O2 Saturation 92.8 %   Patient temperature 37.3    Collection site RIGHT RADIAL    Drawn by [id number]    Sample type ARTERIAL    Allens test (pass/fail) PASS PASS    Comment: Performed at [facility], [place] [address] [place], [place] [address]   Dg Abdomen 1 View  Result Date: 01/01/2018 CLINICAL DATA:  NG tube placement EXAM: ABDOMEN - 1 VIEW COMPARISON:  None. FINDINGS: Enteric tube tip is in the medial left upper quadrant with proximal side hole projected at or just above the EG junction. Advancement is suggested if placement in the stomach is desired. Surgical clips in the right upper quadrant. Postoperative changes in the lower lumbar spine. Mildly dilated gas-filled central small-bowel with suggestion of small bowel fold thickening. This could indicate early/partial obstruction or enteritis. No radiopaque stones. IMPRESSION: Enteric tube tip projects over the upper stomach with proximal side hole likely above the EG junction. Advancement is suggested. Mildly dilated gas-filled small bowel with suggestion of fold thickening. This could indicate early/partial obstruction or enteritis. Electronically Signed   By: Lucienne Capers M.D.   On: 01/01/2018 22:31   Ct Head Wo Contrast  Result Date: 01/01/2018 CLINICAL DATA:  Found unresponsive.  Intubated. EXAM: CT HEAD WITHOUT  CONTRAST TECHNIQUE: Contiguous axial images were obtained from the base of the skull through the vertex without intravenous contrast. COMPARISON:  None.  FINDINGS: Brain: Moderate generalized atrophy. No evidence of old or acute focal infarction, mass lesion, hemorrhage, hydrocephalus or extra-axial collection. Vascular: No abnormal vascular finding. Skull: Negative Sinuses/Orbits: Clear/normal Other: None IMPRESSION: No acute finding by CT.  Moderate atrophy. Electronically Signed   By: Nelson Chimes M.D.   On: 01/01/2018 14:51   Dg Chest Portable 1 View  Result Date: 01/01/2018 CLINICAL DATA:  Endotracheal tube placement EXAM: PORTABLE CHEST 1 VIEW COMPARISON:  01/01/2018 FINDINGS: Endotracheal tube placed with tip measuring 4.7 cm above the carina. Enteric tube tip projects over the left upper quadrant, likely in the upper stomach. Shallow inspiration with atelectasis in the lung bases. Blunting of the costophrenic angles suggesting small effusions. Heart size is normal for technique. No pneumothorax. Calcification of the aorta. IMPRESSION: Endotracheal tube appears in satisfactory position. Shallow inspiration with atelectasis in the lung bases. Aortic atherosclerosis. Electronically Signed   By: Lucienne Capers M.D.   On: 01/01/2018 22:32   Dg Chest Port 1 View  Result Date: 01/01/2018 CLINICAL DATA:  Pt unresponsive. Pt comes from [facility]. Pt was found unresponsive in hotel room by house cleaning and did not call 911 until about an hour later. Pt was found face down with ETOH bottles all over room. Smoker. EXAM: PORTABLE CHEST 1 VIEW COMPARISON:  09/01/2013 FINDINGS: Lung volumes are low. There is hazy opacity at the right lung base that is likely atelectasis. Pneumonia is not excluded but felt less likely. Remainder of the lungs is clear. No evidence of pulmonary edema. No pneumothorax. Cardiac silhouette is normal in size. No mediastinal or hilar masses. Skeletal structures are grossly intact. IMPRESSION: Mild right lung base opacity accentuated by low lung volumes. This is most likely atelectasis. Consider pneumonia or aspiration pneumonitis  in the proper clinical setting. Electronically Signed   By: Lajean Manes M.D.   On: 01/01/2018 13:59    Pending Labs Unresulted Labs (From admission, onward)   Start     Ordered   01/01/18 2343  Culture, blood (routine x 2)  BLOOD CULTURE X 2,   R     01/01/18 2342   01/01/18 2233  Procalcitonin  STAT,   R     01/01/18 2233   01/01/18 2233  Triglycerides  (propofol (DIPRIVAN))  Every 72 hours,   R    Comments:  While on propofol (DIPRIVAN)    01/01/18 2233   01/01/18 2231  Culture, respiratory (tracheal aspirate)  STAT,   R     01/01/18 2233   01/01/18 2231  Urinalysis, Routine w reflex microscopic  Once,   R     01/01/18 2233   01/01/18 2230  Lactic acid, plasma  STAT Now then every 3 hours,   R     01/01/18 2233   01/01/18 2229  HIV antibody (Routine Testing)  Once,   R     01/01/18 2233   01/01/18 9191  Basic metabolic panel  STAT,   STAT     01/01/18 2044      Vitals/Pain Today's Vitals   01/01/18 2345 01/02/18 0000 01/02/18 0008 01/02/18 0030  BP: (!) 107/96 122/88 122/88 117/81  Pulse: 87 90 89 90  Resp: (!) 25 (!) 24 (!) 24 (!) 22  Temp: 98.6 F (37 C) 98.8 F (37.1 C) 99 F (37.2 C) 99.1 F (37.3 C)  TempSrc:  SpO2: 97% 97% 96% 95%  Weight:      Height:      PainSc:        Isolation Precautions No active isolations  Medications Medications  succinylcholine (ANECTINE) injection 150 mg (150 mg Intravenous See Procedure Record 01/01/18 2208)  propofol (DIPRIVAN) 1000 MG/100ML infusion (10 mcg/kg/min  108.9 kg Intravenous Rate/Dose Change 01/01/18 2318)  etomidate (AMIDATE) injection 20 mg (20 mg Intravenous See Procedure Record 01/01/18 2207)  0.9 %  sodium chloride infusion (has no administration in time range)  heparin injection 5,000 Units (has no administration in time range)  famotidine (PEPCID) 40 MG/5ML suspension 20 mg (has no administration in time range)  0.9 %  sodium chloride infusion ( Intravenous New Bag/Given 01/01/18 2235)  ondansetron  (ZOFRAN) injection 4 mg (has no administration in time range)  albuterol (PROVENTIL) (2.5 MG/3ML) 0.083% nebulizer solution 2.5 mg (has no administration in time range)  chlorhexidine gluconate (MEDLINE KIT) (PERIDEX) 0.12 % solution 15 mL (has no administration in time range)  MEDLINE mouth rinse (has no administration in time range)  insulin aspart (novoLOG) injection 2-6 Units (has no administration in time range)  fentaNYL (SUBLIMAZE) injection 50 mcg (50 mcg Intravenous See Procedure Record 01/01/18 2249)  fentaNYL 2549mg in NS 2549m(1071mml) infusion-PREMIX (200 mcg/hr Intravenous Rate/Dose Change 01/02/18 0039)  fentaNYL (SUBLIMAZE) bolus via infusion 50 mcg (50 mcg Intravenous Bolus from Bag 01/01/18 2248)  midazolam (VERSED) injection 2 mg (2 mg Intravenous Given 01/02/18 0032)  midazolam (VERSED) injection 2 mg (has no administration in time range)  docusate (COLACE) 50 MG/5ML liquid 100 mg (has no administration in time range)  acetaminophen (TYLENOL) suppository 650 mg (has no administration in time range)    Or  acetaminophen (TYLENOL) solution 650 mg (has no administration in time range)  midazolam (VERSED) injection 1-2 mg (has no administration in time range)  thiamine (VITAMIN B-1) tablet 100 mg (has no administration in time range)  folic acid (FOLVITE) tablet 1 mg (has no administration in time range)  multivitamin liquid 15 mL (has no administration in time range)  piperacillin-tazobactam (ZOSYN) IVPB 3.375 g (has no administration in time range)  piperacillin-tazobactam (ZOSYN) IVPB 3.375 g (has no administration in time range)  sodium chloride 0.9 % bolus 1,000 mL (0 mLs Intravenous Stopped 01/01/18 1518)  sodium chloride 0.9 % bolus 1,000 mL (0 mLs Intravenous Stopped 01/01/18 1809)  naloxone (NARCAN) injection 0.4 mg (0.4 mg Intravenous Given 01/01/18 2042)  etomidate (AMIDATE) injection (20 mg Intravenous Given 01/01/18 2138)  succinylcholine (ANECTINE) injection (50 mg  Intravenous Given 01/01/18 2148)  0.9 %  sodium chloride infusion ( Intravenous Stopped 01/01/18 2235)  propofol (DIPRIVAN) 1000 MG/100ML infusion (5 mcg/kg/min  108.9 kg Intravenous New Bag/Given 01/01/18 2152)  sodium chloride 0.9 % bolus 1,000 mL (1,000 mLs Intravenous New Bag/Given 01/02/18 0030)    Mobility walks

## 2018-01-02 NOTE — Progress Notes (Signed)
Initial Nutrition Assessment  DOCUMENTATION CODES:   Obesity unspecified  INTERVENTION:  - Will order [id number]:Vital High Protein @ 50 mL/hr with 60 mL Prostat BID. This regimen will provide 1600 kcal, 165 grams of protein, and 1003 mL free water. - Feel that pt is not at refeeding risk at this time but will need to monitor and update this if warranted.  - Free water flush to be per CCM.   NUTRITION DIAGNOSIS:   Inadequate oral intake related to inability to eat as evidenced by NPO status.  GOAL:   Provide needs based on ASPEN/SCCM guidelines  MONITOR:   Vent status, TF tolerance, Weight trends, Labs  REASON FOR ASSESSMENT:   Ventilator, Consult  ASSESSMENT:   62 yo male with hx of DM, HTN, depression, Etoh abuse, polysubstance abuse, GERD, psoriasis. He lives in a hotel and was found 4/7 AM unresponsive by housekeeping in his hotel room. Reportedly he was slumped over a desk and had soiled himself. The housekeeping left the room without intervention. They came back 2 hours later and found him still unresponsive so called 911. He was brought to the ER where he was found to be unresponsive or minimally responsive, opening eyes only to pain, documented as tachycardic and tachypneic with increased work of breathing, no gag. Patient required intubation for acute hypercapneic respiratory failure and failure to protect his airway.   Pt intubated, sedated, with NGT in place in L nare. Per review of abdominal x-ray results report from yesterday: "Enteric tube tip projects over the upper stomach with proximal side hole likely above the EG junction. Advancement is suggested." Chest x-ray results report from today states "Enteric tube tip in proximal stomach." No family or visitors present at this time to provide PTA information. Per review of Care Everywhere, [facility]; consistent with current weight. Dr. Kendrick FriesMcQuaid gave RD verbal order for TF order.   Per Dr. Merlene PullingHammonds' note  yesterday PM: acute hypercapneic respiratory failure, severe sepsis with aspiration PNA, acute encephalopathy, alcohol overdose, benzo overdose.  Patient is currently intubated on ventilator support MV: 14 L/min Temp (24hrs), Avg:98.6 F (37 C), Min:95.7 F (35.4 C), Max:99.4 F (37.4 C) Propofol: none BP: 114/69 and MAP: 83  Medications reviewed; 100 mg Colace per NGT BID, 1 mg folic acid per NGT/day, sliding scale Novolog, 15 mL liquid multivitamin per NGT/day, 100 mg thiamine per NGT/day.  Labs reviewed; CBGs: 122 and 111 mg/dL today, Cl: 147113 mmol/L, Ca: 7.9 mg/dL.  IVF: NS @ 75 mL/hr. Drip: Fentanyl @ 300 mcg/hr.      NUTRITION - FOCUSED PHYSICAL EXAM:  Completed/assessed with no muscle and no fat wasting noted, no edema at this time.  Diet Order:  Diet NPO time specified  EDUCATION NEEDS:   No education needs have been identified at this time  Skin:  Skin Assessment: Reviewed RN Assessment  Last BM:  PTA/unknown  Height:   Ht Readings from Last 1 Encounters:  01/01/18 6' (1.829 m)    Weight:   Wt Readings from Last 1 Encounters:  01/02/18 255 lb 15.3 oz (116.1 kg)    Ideal Body Weight:  80.91 kg  BMI:  Body mass index is 34.71 kg/m.  Estimated Nutritional Needs:   Kcal:  [id number] (11-14 kcal/kg)  Protein:  >/= 162 grams (2 grams/kg IBW)  Fluid:  >/= 2 L/day      Trenton GammonJessica Najae Rathert, MS, RD, LDN, [facility] Inpatient Clinical Dietitian Pager # [phone] After hours/weekend pager # [phone]

## 2018-01-02 NOTE — Progress Notes (Signed)
Pt transported from ED to ICU 1236 on vent 100% fio2.  Pt tolerated transport well without incident. 

## 2018-01-02 NOTE — Progress Notes (Signed)
eLink Physician-Brief Progress Note Patient Name: Cory MoralesRandall M Pomplun Jr. DOB: 1956-08-16 MRN: [id number]   Date of Service  01/02/2018  HPI/Events of Note  Multiple issues: 1. Lactic Acid - 2.3 --> 3.2 and 2. Triglyceride level = 309  eICU Interventions  Will order: 1. Bolus with 0.9 NaCl 1 liter IV over 1 hour now.  2. D/C Profolol IV infusion. 3. Try to manage patient with Fentanyl IV infusion and Versed IV PRN. 4. If not able to manage sedation without Propofol IV infusion, will consider a Precedex IV infusion.      Intervention Category Major Interventions: Delirium, psychosis, severe agitation - evaluation and management  Siris Hoos [place] 01/02/2018, 2:19 AM

## 2018-01-03 ENCOUNTER — Inpatient Hospital Stay (HOSPITAL_COMMUNITY): Payer: Medicare Other

## 2018-01-03 LAB — BASIC METABOLIC PANEL
Anion gap: 11 (ref 5–15)
BUN: 15 mg/dL (ref 6–20)
CALCIUM: 8.1 mg/dL — AB (ref 8.9–10.3)
CHLORIDE: 109 mmol/L (ref 101–111)
CO2: 22 mmol/L (ref 22–32)
Creatinine, Ser: 0.94 mg/dL (ref 0.61–1.24)
GFR calc Af Amer: >60 mL/min (ref >60)
GFR calc non Af Amer: >60 mL/min (ref >60)
GLUCOSE: 169 mg/dL — AB (ref 65–99)
Potassium: 4 mmol/L (ref 3.5–5.1)
Sodium: 142 mmol/L (ref 135–145)

## 2018-01-03 LAB — PHOSPHORUS
Phosphorus: 1.6 mg/dL — ABNORMAL LOW (ref 2.5–4.6)
Phosphorus: 2 mg/dL — ABNORMAL LOW (ref 2.5–4.6)

## 2018-01-03 LAB — GLUCOSE, CAPILLARY
GLUCOSE-CAPILLARY: 151 mg/dL — AB (ref 65–99)
GLUCOSE-CAPILLARY: 169 mg/dL — AB (ref 65–99)
GLUCOSE-CAPILLARY: 264 mg/dL — AB (ref 65–99)
Glucose-Capillary: 188 mg/dL — ABNORMAL HIGH (ref 65–99)
Glucose-Capillary: 211 mg/dL — ABNORMAL HIGH (ref 65–99)
Glucose-Capillary: 223 mg/dL — ABNORMAL HIGH (ref 65–99)

## 2018-01-03 LAB — CBC WITH DIFFERENTIAL/PLATELET
BASOS PCT: 0 %
Basophils Absolute: 0 10*3/uL (ref 0.0–0.1)
EOS ABS: 0.1 10*3/uL (ref 0.0–0.7)
EOS PCT: 1 %
HEMATOCRIT: 39 % (ref 39.0–52.0)
HEMOGLOBIN: 12.8 g/dL — AB (ref 13.0–17.0)
Lymphocytes Relative: 28 %
Lymphs Abs: 2.1 10*3/uL (ref 0.7–4.0)
MCH: 36.4 pg — AB (ref 26.0–34.0)
MCHC: 32.8 g/dL (ref 30.0–36.0)
MCV: 110.8 fL — AB (ref 78.0–100.0)
MONOS PCT: 13 %
Monocytes Absolute: 1 10*3/uL (ref 0.1–1.0)
NEUTROS ABS: 4.2 10*3/uL (ref 1.7–7.7)
Neutrophils Relative %: 58 %
Platelets: 100 10*3/uL — ABNORMAL LOW (ref 150–400)
RBC: 3.52 MIL/uL — ABNORMAL LOW (ref 4.22–5.81)
RDW: 18.3 % — ABNORMAL HIGH (ref 11.5–15.5)
WBC: 7.4 10*3/uL (ref 4.0–10.5)

## 2018-01-03 LAB — MAGNESIUM
MAGNESIUM: 1.2 mg/dL — AB (ref 1.7–2.4)
MAGNESIUM: 1.9 mg/dL (ref 1.7–2.4)

## 2018-01-03 MED ORDER — SODIUM CHLORIDE 0.9 % IV SOLN
3.0000 g | Freq: Four times a day (QID) | INTRAVENOUS | Status: AC
Start: 2018-01-03 — End: 2018-01-07
  Administered 2018-01-03 – 2018-01-07 (×16): 3 g via INTRAVENOUS
  Filled 2018-01-03 (×16): qty 3

## 2018-01-03 MED ORDER — MAGNESIUM SULFATE 4 GM/100ML IV SOLN
4.0000 g | Freq: Once | INTRAVENOUS | Status: AC
  Administered 2018-01-03: 4 g via INTRAVENOUS
  Filled 2018-01-03: qty 100

## 2018-01-03 MED ORDER — DEXMEDETOMIDINE HCL IN NACL 200 MCG/50ML IV SOLN
0.4000 ug/kg/h | INTRAVENOUS | Status: DC
  Administered 2018-01-03 – 2018-01-04 (×5): 0.4 ug/kg/h via INTRAVENOUS
  Filled 2018-01-03 (×5): qty 50

## 2018-01-03 MED ORDER — POTASSIUM & SODIUM PHOSPHATES 280-160-250 MG PO PACK
1.0000 | PACK | Freq: Three times a day (TID) | ORAL | Status: AC
  Administered 2018-01-03 – 2018-01-04 (×4): 1 via ORAL
  Filled 2018-01-03 (×6): qty 1

## 2018-01-03 MED ORDER — FUROSEMIDE 10 MG/ML IJ SOLN
40.0000 mg | Freq: Four times a day (QID) | INTRAMUSCULAR | Status: AC
  Administered 2018-01-03 (×2): 40 mg via INTRAVENOUS
  Filled 2018-01-03 (×2): qty 4

## 2018-01-03 MED ORDER — CHLORHEXIDINE GLUCONATE 0.12% ORAL RINSE (MEDLINE KIT): 15.0000 mL | Freq: Two times a day (BID) | OROMUCOSAL | Status: DC

## 2018-01-03 MED ORDER — ORAL CARE MOUTH RINSE: 15.0000 mL | Freq: Four times a day (QID) | OROMUCOSAL | Status: DC

## 2018-01-03 MED ORDER — ALBUTEROL SULFATE (2.5 MG/3ML) 0.083% IN NEBU: 2.5000 mg | INHALATION_SOLUTION | RESPIRATORY_TRACT | Status: DC | PRN

## 2018-01-03 MED ORDER — INSULIN ASPART 100 UNIT/ML ~~LOC~~ SOLN
0.0000 [IU] | SUBCUTANEOUS | Status: DC
  Administered 2018-01-03: 5 [IU] via SUBCUTANEOUS
  Administered 2018-01-03: 8 [IU] via SUBCUTANEOUS
  Administered 2018-01-04 (×2): 3 [IU] via SUBCUTANEOUS
  Administered 2018-01-04 (×2): 2 [IU] via SUBCUTANEOUS
  Administered 2018-01-04 – 2018-01-05 (×2): 3 [IU] via SUBCUTANEOUS
  Administered 2018-01-05: 2 [IU] via SUBCUTANEOUS
  Administered 2018-01-05: 3 [IU] via SUBCUTANEOUS
  Administered 2018-01-05 – 2018-01-06 (×2): 2 [IU] via SUBCUTANEOUS
  Administered 2018-01-06: 3 [IU] via SUBCUTANEOUS
  Administered 2018-01-06: 2 [IU] via SUBCUTANEOUS
  Administered 2018-01-06: 3 [IU] via SUBCUTANEOUS
  Administered 2018-01-07 – 2018-01-08 (×9): 2 [IU] via SUBCUTANEOUS
  Administered 2018-01-09 (×2): 3 [IU] via SUBCUTANEOUS
  Administered 2018-01-09 (×4): 2 [IU] via SUBCUTANEOUS
  Administered 2018-01-09: 3 [IU] via SUBCUTANEOUS
  Administered 2018-01-10: 2 [IU] via SUBCUTANEOUS
  Administered 2018-01-10: 3 [IU] via SUBCUTANEOUS
  Administered 2018-01-11 – 2018-01-14 (×12): 2 [IU] via SUBCUTANEOUS
  Administered 2018-01-15 (×2): 3 [IU] via SUBCUTANEOUS
  Administered 2018-01-15 – 2018-01-16 (×3): 2 [IU] via SUBCUTANEOUS
  Administered 2018-01-16: 3 [IU] via SUBCUTANEOUS
  Administered 2018-01-16 (×2): 2 [IU] via SUBCUTANEOUS

## 2018-01-03 NOTE — Progress Notes (Signed)
PULMONARY / CRITICAL CARE MEDICINE   Name: Cory Foster. MRN: [id number] DOB: 08/11/1956    ADMISSION DATE:  01/01/2018 CONSULTATION DATE:  01/01/2018  REFERRING MD:  Dr. Fayrene Fearing  CHIEF COMPLAINT:  Found down  HISTORY OF PRESENT ILLNESS:   62 y/o male with a history of EtOH abuse and polystubstance abuse was admitted on 4/7 after he was found unresponsive at a hotel. Intubated in the ER.  Has pneumonia.  SUBJECTIVE:  Some agitation Hypoxemia improved Fever  VITAL SIGNS: BP 131/80   Pulse 94   Temp (!) 101.6 F (38.7 C) (Oral)   Resp (!) 25   Ht 6' (1.829 m)   Wt 263 lb 10.7 oz (119.6 kg)   SpO2 95%   BMI 35.76 kg/m   HEMODYNAMICS:    VENTILATOR SETTINGS: Vent Mode: PRVC FiO2 (%):  [30 %-60 %] 30 % Set Rate:  [24 bmp] 24 bmp Vt Set:  [409 mL] 620 mL PEEP:  [5 cmH20] 5 cmH20 Plateau Pressure:  [9 cmH20-21 cmH20] 21 cmH20  INTAKE / OUTPUT: I/O last 3 completed shifts: In: 3517.2 [I.V.:2698.9; NG/GT:668.3; IV Piggyback:150] Out: 1210 [Urine:1210]  PHYSICAL EXAMINATION:  General:  In bed on vent HENT: NCAT ETT in place PULM: CTA B, vent supported breathing CV: RRR, no mgr GI: BS+, soft, nontender MSK: normal bulk and tone Neuro: sedated on vent    LABS:  BMET Recent Labs  Lab 01/01/18 1340 01/02/18 0021 01/03/18 0523  NA 141 145 142  K 4.7 4.5 4.0  CL 104 113* 109  CO2 22 21* 22  BUN 7 7 15   CREATININE 1.06 0.89 0.94  GLUCOSE 178* 141* 169*    Electrolytes Recent Labs  Lab 01/01/18 1340 01/02/18 0021 01/02/18 1255 01/02/18 1716 01/03/18 0523  CALCIUM 9.0 7.9*  --   --  8.1*  MG  --   --  1.2* 1.2* 1.2*  PHOS  --   --  2.9 1.9* 1.6*    CBC Recent Labs  Lab 01/01/18 1340 01/03/18 0523  WBC 11.9* 7.4  HGB 17.6* 12.8*  HCT 51.4 39.0  PLT 325 100*    Coag's No results for input(s): APTT, INR in the last 168 hours.  Sepsis Markers Recent Labs  Lab 01/02/18 0021 01/02/18 0033 01/02/18 0334 01/02/18 1442  LATICACIDVEN   --  3.2* 4.1* 2.1*  PROCALCITON <0.10  --   --   --     ABG Recent Labs  Lab 01/01/18 2045 01/01/18 2215 01/02/18 0020  PHART 7.212* 7.225* 7.342*  PCO2ART 60.9* 52.8* 40.3  PO2ART 131* 292* 73.7*    Liver Enzymes Recent Labs  Lab 01/01/18 1340  AST 96*  ALT 75*  ALKPHOS 79  BILITOT 0.6  ALBUMIN 4.0    Cardiac Enzymes No results for input(s): TROPONINI, PROBNP in the last 168 hours.  Glucose Recent Labs  Lab 01/02/18 1112 01/02/18 1548 01/02/18 1922 01/02/18 2306 01/03/18 0321 01/03/18 0722  GLUCAP 165* 192* 184* 172* 188* 151*    Imaging Dg Chest Port 1 View  Result Date: 01/03/2018 CLINICAL DATA:  Acute respiratory failure with hypoxia. History of major depression, alcohol dependence and other substance abuse, diabetes, current smoker. EXAM: PORTABLE CHEST 1 VIEW COMPARISON:  Chest x-ray of January 02, 2018 FINDINGS: The lungs are mildly hypoinflated. There are bibasilar densities which are more conspicuous today. There is no large pleural effusion and no pneumothorax. The heart is top-normal in size. The pulmonary vascularity is not clearly engorged. The endotracheal tube  tip measures 9.2 cm above the carina. The esophagogastric tube tip and proximal port project below the GE junction. IMPRESSION: Bibasilar atelectasis or pneumonia more conspicuous today. Top-normal cardiac size with mild pulmonary vascular congestion. High positioning of the endotracheal tube. Advancement by 3-4 cm is recommended. Electronically Signed   By: David  SwazilandJordan M.D.   On: 01/03/2018 07:18     CULTURES: Sputum culture 4/8 >  UA 4/8 >  Blood cultures 4/8 >   ANTIBIOTICS: Zosyn 4/7 >>  SIGNIFICANT EVENTS: 4/7: found down in hotel room, EtOH overdose, brought to ER where required intubation for hypercapnea and failure to protect airway  LINES/TUBES: PIV's ETT 4/7 >> Foley catheter 4/7 >> NG tube 4/7 >>    DISCUSSION: 62 y/o male with Depression, polysubstance abuse, GERD  admitted with acute respiratory failure with hypoxemia in the setting of polysubstance overdose.  Now with severe hypoxemia and pneumonia.  ASSESSMENT / PLAN:  PULMONARY A: Acute respiratory failure with hypoxemia Aspiration pneumonia P:   Full mechanical vent support VAP prevention Daily WUA/SBT Diurese 4/9 SBT today when more awake, hoping for extubation  CARDIOVASCULAR A:  No acute issues Lactic acid stabilzied P:  Tele Monitor hemodynamics Repeat lactic acid   RENAL A:   No acute issues P:   Monitor BMET and UOP Replace electrolytes as needed   GASTROINTESTINAL A:   No acute issues P:   Continue tube feedings and ranitidine  HEMATOLOGIC A:   No acute issues P:  Monitor for bleeding  INFECTIOUS A:   Aspiration pneumonia P:   Monitor resp cultures Change zosyn to unasyn 4/9   ENDOCRINE A:   No acute issues   P:   Monitor glucose  NEUROLOGIC A:   Polysubstance abuse P:   RASS goal -1 PAD protocol, continue fentanyl infusion, prn versed   FAMILY  - Updates: none bedside  - Inter-disciplinary family meet or Palliative Care meeting due by:  day 7  My cc time 35 minutes  Heber CarolinaBrent Denis Carreon, MD [place] [facility] Pager: [phone] Cell: [phone] After 3pm or if no response, call [phone]   01/03/2018, 8:28 AM

## 2018-01-03 NOTE — Progress Notes (Signed)
Pharmacy Antibiotic Note  Cory MoralesRandall M Giroux Jr. is a 62 y.o. male admitted on 01/01/2018 with pneumonia.  Pharmacy has been consulted for zosyn dosing on 4/7, changing to Unasyn for aspiration PNA per CCM orders on 4/9  Plan: Unasyn 3g IV q6 Will sign off  Height: 6' (182.9 cm) Weight: 263 lb 10.7 oz (119.6 kg) IBW/kg (Calculated) : 77.6  Temp (24hrs), Avg:100.7 F (38.2 C), Min:99.6 F (37.6 C), Max:101.6 F (38.7 C)  Recent Labs  Lab 01/01/18 1340 01/01/18 1426 01/01/18 1957 01/02/18 0021 01/02/18 0033 01/02/18 0334 01/02/18 1442 01/03/18 0523  WBC 11.9*  --   --   --   --   --   --  7.4  CREATININE 1.06  --   --  0.89  --   --   --  0.94  LATICACIDVEN  --  3.58* 2.5*  --  3.2* 4.1* 2.1*  --     Estimated Creatinine Clearance: 111.6 mL/min (by C-G formula based on SCr of 0.94 mg/dL).    Allergies  Allergen Reactions  . Ceclor [Cefaclor]     unknown  . Ciprofloxacin     unknown  . Citalopram     Hyper   . Seroquel [Quetiapine Fumarate]     "hung over" feeling    Thank you for allowing pharmacy to be a part of this patient's care.   Hessie KnowsJustin M Brisha Mccabe, PharmD, BCPS Pager [phone] 01/03/2018 10:14 AM

## 2018-01-03 NOTE — Progress Notes (Signed)
eLink Physician-Brief Progress Note Patient Name: Hal MoralesRandall M Kuzel Jr. DOB: Jun 04, 1956 MRN: [id number]   Date of Service  01/03/2018  HPI/Events of Note  Hyperglycemia - Blood glucose = 264.   eICU Interventions  Will change Q 4 standard Novolog SSI to Q 4 hour moderate Novolog SSI.      Intervention Category Major Interventions: Hyperglycemia - active titration of insulin therapy  Lenell AntuSommer,Steven Eugene 01/03/2018, 8:17 PM

## 2018-01-03 NOTE — Progress Notes (Signed)
eLink Physician-Brief Progress Note Patient Name: Cory MoralesRandall M Janak Jr. DOB: April 29, 1956 MRN: [id number]   Date of Service  01/03/2018  HPI/Events of Note  Agitation - Request for bilateral soft wrist restraints.   eICU Interventions  Will order bilateral soft wrist restraints.      Intervention Category Minor Interventions: Agitation / anxiety - evaluation and management  Sommer,Steven Eugene 01/03/2018, 4:18 AM

## 2018-01-04 ENCOUNTER — Inpatient Hospital Stay (HOSPITAL_COMMUNITY): Payer: Medicare Other

## 2018-01-04 LAB — GLUCOSE, CAPILLARY
GLUCOSE-CAPILLARY: 179 mg/dL — AB (ref 65–99)
GLUCOSE-CAPILLARY: 176 mg/dL — AB (ref 65–99)
GLUCOSE-CAPILLARY: 195 mg/dL — AB (ref 65–99)
GLUCOSE-CAPILLARY: 169 mg/dL — AB (ref 65–99)
Glucose-Capillary: 141 mg/dL — ABNORMAL HIGH (ref 65–99)

## 2018-01-04 LAB — CBC WITH DIFFERENTIAL/PLATELET
BASOS ABS: 0.1 10*3/uL (ref 0.0–0.1)
Basophils Relative: 1 %
Eosinophils Absolute: 0.1 10*3/uL (ref 0.0–0.7)
Eosinophils Relative: 1 %
HEMATOCRIT: 36.5 % — AB (ref 39.0–52.0)
HEMOGLOBIN: 12 g/dL — AB (ref 13.0–17.0)
LYMPHS PCT: 23 %
Lymphs Abs: 1.5 10*3/uL (ref 0.7–4.0)
MCH: 36.3 pg — ABNORMAL HIGH (ref 26.0–34.0)
MCHC: 32.9 g/dL (ref 30.0–36.0)
MCV: 110.3 fL — ABNORMAL HIGH (ref 78.0–100.0)
MONOS PCT: 9 %
Monocytes Absolute: 0.6 10*3/uL (ref 0.1–1.0)
NEUTROS ABS: 4.3 10*3/uL (ref 1.7–7.7)
NEUTROS PCT: 66 %
Platelets: 97 10*3/uL — ABNORMAL LOW (ref 150–400)
RBC: 3.31 MIL/uL — ABNORMAL LOW (ref 4.22–5.81)
RDW: 17.9 % — ABNORMAL HIGH (ref 11.5–15.5)
WBC: 6.6 10*3/uL (ref 4.0–10.5)

## 2018-01-04 LAB — BASIC METABOLIC PANEL
ANION GAP: 11 (ref 5–15)
BUN: 20 mg/dL (ref 6–20)
CO2: 27 mmol/L (ref 22–32)
Calcium: 8.3 mg/dL — ABNORMAL LOW (ref 8.9–10.3)
Chloride: 108 mmol/L (ref 101–111)
Creatinine, Ser: 1 mg/dL (ref 0.61–1.24)
GFR calc Af Amer: >60 mL/min (ref >60)
GLUCOSE: 183 mg/dL — AB (ref 65–99)
POTASSIUM: 3.4 mmol/L — AB (ref 3.5–5.1)
Sodium: 146 mmol/L — ABNORMAL HIGH (ref 135–145)

## 2018-01-04 MED ORDER — HALOPERIDOL LACTATE 5 MG/ML IJ SOLN
INTRAMUSCULAR | Status: AC
  Administered 2018-01-04: 5 mg via INTRAMUSCULAR
  Filled 2018-01-04: qty 1

## 2018-01-04 MED ORDER — LORAZEPAM 2 MG/ML IJ SOLN
INTRAMUSCULAR | Status: AC
  Administered 2018-01-04: 2 mg via INTRAVENOUS
  Filled 2018-01-04: qty 1

## 2018-01-04 MED ORDER — DEXMEDETOMIDINE HCL IN NACL 200 MCG/50ML IV SOLN
0.4000 ug/kg/h | INTRAVENOUS | Status: AC
  Administered 2018-01-04: 0.5 ug/kg/h via INTRAVENOUS
  Administered 2018-01-04 – 2018-01-05 (×2): 0.4 ug/kg/h via INTRAVENOUS
  Filled 2018-01-04 (×2): qty 50
  Filled 2018-01-04 (×2): qty 100
  Filled 2018-01-04: qty 50

## 2018-01-04 NOTE — Procedures (Signed)
Extubation Procedure Note  Patient Details:   Name: Cory MoralesRandall M Neumeier Jr. DOB: [birth year] MRN: [id number]   Airway Documentation:     Evaluation  O2 sats: 90 complications: none Patient tolerated procedure well. Bilateral Breath Sounds: Clear   Pt able to speak  Per CCM order, pt extubated and placed on nasal cannula  Makeyla Govan S 01/04/2018, 10:14 AM

## 2018-01-04 NOTE — Plan of Care (Signed)
  Problem: Education: Goal: Knowledge of General Education information will improve Outcome: Progressing   Problem: Health Behavior/Discharge Planning: Goal: Ability to manage health-related needs will improve Outcome: Progressing   Problem: Clinical Measurements: Goal: Ability to maintain clinical measurements within normal limits will improve Outcome: Progressing Goal: Will remain free from infection Outcome: Progressing Goal: Diagnostic test results will improve Outcome: Progressing Goal: Respiratory complications will improve Outcome: Progressing Goal: Cardiovascular complication will be avoided Outcome: Progressing   Problem: Activity: Goal: Risk for activity intolerance will decrease Outcome: Progressing   Problem: Nutrition: Goal: Adequate nutrition will be maintained Outcome: Progressing   Problem: Coping: Goal: Level of anxiety will decrease Outcome: Progressing   Problem: Elimination: Goal: Will not experience complications related to bowel motility Outcome: Not Applicable Goal: Will not experience complications related to urinary retention Outcome: Not Applicable   Problem: Pain Managment: Goal: General experience of comfort will improve Outcome: Progressing   Problem: Safety: Goal: Ability to remain free from injury will improve Outcome: Progressing   Problem: Skin Integrity: Goal: Risk for impaired skin integrity will decrease Outcome: Progressing   Problem: Elimination: Goal: Will not experience complications related to bowel motility Outcome: Not Applicable Goal: Will not experience complications related to urinary retention Outcome: Not Applicable

## 2018-01-04 NOTE — Progress Notes (Signed)
PULMONARY / CRITICAL CARE MEDICINE   Name: Cory MoralesRandall M Sigl Jr. MRN: [id number] DOB: 1956-07-26    ADMISSION DATE:  01/01/2018 CONSULTATION DATE:  01/01/2018  REFERRING MD:  Dr. Fayrene FearingJames  CHIEF COMPLAINT:  Found down  HISTORY OF PRESENT ILLNESS:   62 y/o male with a history of EtOH abuse and polystubstance abuse was admitted on 4/7 after he was found unresponsive at a hotel. Intubated in the ER.  Has pneumonia.  SUBJECTIVE:  Quiet night Remains on fentanyl drip  VITAL SIGNS: BP 92/61   Pulse 71   Temp 99.9 F (37.7 C)   Resp (!) 25   Ht 6' (1.829 m)   Wt 117.9 kg (259 lb 14.8 oz)   SpO2 96%   BMI 35.25 kg/m   HEMODYNAMICS:    VENTILATOR SETTINGS: Vent Mode: PSV;CPAP FiO2 (%):  [30 %-50 %] 40 % Set Rate:  [14 bmp] 14 bmp Vt Set:  [620 mL] 620 mL PEEP:  [5 cmH20] 5 cmH20 Pressure Support:  [5 cmH20] 5 cmH20 Plateau Pressure:  [8 cmH20-24 cmH20] 17 cmH20  INTAKE / OUTPUT: I/O last 3 completed shifts: In: 5829.3 [I.V.:3450.1; Other:10; NG/GT:1919.2; IV Piggyback:450] Out: 4460 [Urine:4460]  PHYSICAL EXAMINATION:  General:  In bed on vent HENT: NCAT ETT in place PULM: CTA B, vent supported breathing CV: RRR, no mgr GI: BS+, soft, nontender MSK: normal bulk and tone Neuro: awake on vent, follows commands   LABS:  BMET Recent Labs  Lab 01/02/18 0021 01/03/18 0523 01/04/18 0332  NA 145 142 146*  K 4.5 4.0 3.4*  CL 113* 109 108  CO2 21* 22 27  BUN 7 15 20   CREATININE 0.89 0.94 1.00  GLUCOSE 141* 169* 183*    Electrolytes Recent Labs  Lab 01/02/18 0021  01/02/18 1716 01/03/18 0523 01/03/18 1655 01/04/18 0332  CALCIUM 7.9*  --   --  8.1*  --  8.3*  MG  --    < > 1.2* 1.2* 1.9  --   PHOS  --    < > 1.9* 1.6* 2.0*  --    < > = values in this interval not displayed.    CBC Recent Labs  Lab 01/01/18 1340 01/03/18 0523 01/04/18 0332  WBC 11.9* 7.4 6.6  HGB 17.6* 12.8* 12.0*  HCT 51.4 39.0 36.5*  PLT 325 100* 97*    Coag's No results for  input(s): APTT, INR in the last 168 hours.  Sepsis Markers Recent Labs  Lab 01/02/18 0021 01/02/18 0033 01/02/18 0334 01/02/18 1442  LATICACIDVEN  --  3.2* 4.1* 2.1*  PROCALCITON <0.10  --   --   --     ABG Recent Labs  Lab 01/01/18 2045 01/01/18 2215 01/02/18 0020  PHART 7.212* 7.225* 7.342*  PCO2ART 60.9* 52.8* 40.3  PO2ART 131* 292* 73.7*    Liver Enzymes Recent Labs  Lab 01/01/18 1340  AST 96*  ALT 75*  ALKPHOS 79  BILITOT 0.6  ALBUMIN 4.0    Cardiac Enzymes No results for input(s): TROPONINI, PROBNP in the last 168 hours.  Glucose Recent Labs  Lab 01/03/18 1144 01/03/18 1516 01/03/18 1938 01/03/18 2304 01/04/18 0305 01/04/18 0745  GLUCAP 169* 211* 264* 223* 179* 176*    Imaging Dg Chest Port 1 View  Result Date: 01/04/2018 CLINICAL DATA:  Respiratory failure EXAM: PORTABLE CHEST 1 VIEW COMPARISON:  01/03/2018 FINDINGS: Endotracheal tube and nasogastric catheter are noted in satisfactory position. Cardiac shadow is stable. The overall inspiratory effort is poor although no focal  infiltrate is seen. Improved aeration in the bases is noted when compare with the prior exam. No bony abnormality is seen. IMPRESSION: Improved aeration when compare with the prior exam. Electronically Signed   By: Alcide Clever M.D.   On: 01/04/2018 07:42     CULTURES: Sputum culture 4/8 >  UA 4/8 >  Blood cultures 4/8 >   ANTIBIOTICS: Zosyn 4/7 >>  SIGNIFICANT EVENTS: 4/7: found down in hotel room, EtOH overdose, brought to ER where required intubation for hypercapnea and failure to protect airway  LINES/TUBES: PIV's ETT 4/7 >> Foley catheter 4/7 >> NG tube 4/7 >>    DISCUSSION: 62 y/o male with Depression, polysubstance abuse, GERD admitted with acute respiratory failure with hypoxemia in the setting of polysubstance overdose.  Now with severe hypoxemia and pneumonia.  ASSESSMENT / PLAN:  PULMONARY A: Acute respiratory failure with  hypoxemia Aspiration pneumonia P:   Extubate today Monitor O2 saturation Aspiration precautions post extubation  CARDIOVASCULAR A:  No acute issues P:  Tele Monitor hemodynamics  RENAL A:   No acute issues P:   Monitor BMET and UOP Replace electrolytes as needed   GASTROINTESTINAL A:   No acute issues P:   Advance diet post extubation  HEMATOLOGIC A:   No acute issues P:  Monitor for bleeding  INFECTIOUS A:   Aspiration pneumonia P:   Continue unasyn, plan 5 days  ENDOCRINE A:   No acute issues   P:   Monitor glucose  NEUROLOGIC A:   Polysubstance abuse P:   D/c sedation protocol Monitor for withdrawal   FAMILY  - Updates: none bedside  - Inter-disciplinary family meet or Palliative Care meeting due by:  day 7  My cc time 32 minutes  Heber [place], MD [place] [facility] Pager: [phone] Cell: [phone] After 3pm or if no response, call [phone]   01/04/2018, 9:36 AM

## 2018-01-05 LAB — BASIC METABOLIC PANEL
Anion gap: 10 (ref 5–15)
BUN: 18 mg/dL (ref 6–20)
CO2: 27 mmol/L (ref 22–32)
CREATININE: 0.83 mg/dL (ref 0.61–1.24)
Calcium: 8.4 mg/dL — ABNORMAL LOW (ref 8.9–10.3)
Chloride: 112 mmol/L — ABNORMAL HIGH (ref 101–111)
GFR calc Af Amer: >60 mL/min (ref >60)
GLUCOSE: 142 mg/dL — AB (ref 65–99)
POTASSIUM: 3.4 mmol/L — AB (ref 3.5–5.1)
Sodium: 149 mmol/L — ABNORMAL HIGH (ref 135–145)

## 2018-01-05 LAB — GLUCOSE, CAPILLARY
GLUCOSE-CAPILLARY: 120 mg/dL — AB (ref 65–99)
GLUCOSE-CAPILLARY: 136 mg/dL — AB (ref 65–99)
Glucose-Capillary: 132 mg/dL — ABNORMAL HIGH (ref 65–99)
Glucose-Capillary: 133 mg/dL — ABNORMAL HIGH (ref 65–99)
Glucose-Capillary: 141 mg/dL — ABNORMAL HIGH (ref 65–99)
Glucose-Capillary: 151 mg/dL — ABNORMAL HIGH (ref 65–99)
Glucose-Capillary: 165 mg/dL — ABNORMAL HIGH (ref 65–99)

## 2018-01-05 MED ORDER — ZIPRASIDONE MESYLATE 20 MG IM SOLR
20.0000 mg | Freq: Once | INTRAMUSCULAR | Status: AC
  Administered 2018-01-05: 20 mg via INTRAMUSCULAR

## 2018-01-05 MED ORDER — DEXMEDETOMIDINE HCL IN NACL 400 MCG/100ML IV SOLN
0.4000 ug/kg/h | INTRAVENOUS | Status: DC
  Administered 2018-01-05: 1.2 ug/kg/h via INTRAVENOUS
  Administered 2018-01-06: 0.8 ug/kg/h via INTRAVENOUS
  Filled 2018-01-05 (×3): qty 100

## 2018-01-05 MED ORDER — CLONAZEPAM 0.5 MG PO TBDP
2.0000 mg | ORAL_TABLET | Freq: Two times a day (BID) | ORAL | Status: DC
  Administered 2018-01-05 – 2018-01-09 (×7): 2 mg via ORAL
  Filled 2018-01-05 (×7): qty 4

## 2018-01-05 MED ORDER — DEXMEDETOMIDINE HCL IN NACL 200 MCG/50ML IV SOLN
0.4000 ug/kg/h | INTRAVENOUS | Status: DC
  Administered 2018-01-05: 0.4 ug/kg/h via INTRAVENOUS
  Filled 2018-01-05 (×2): qty 100

## 2018-01-05 MED ORDER — ZIPRASIDONE MESYLATE 20 MG IM SOLR
20.0000 mg | Freq: Once | INTRAMUSCULAR | Status: AC
  Administered 2018-01-05: 20 mg via INTRAMUSCULAR
  Filled 2018-01-05: qty 20

## 2018-01-05 MED ORDER — ADULT MULTIVITAMIN W/MINERALS CH
1.0000 | ORAL_TABLET | Freq: Every day | ORAL | Status: DC
  Administered 2018-01-09 – 2018-01-16 (×8): 1 via ORAL
  Filled 2018-01-05 (×8): qty 1

## 2018-01-05 MED ORDER — LORAZEPAM 2 MG/ML IJ SOLN
2.0000 mg | INTRAMUSCULAR | Status: DC | PRN
  Administered 2018-01-05 (×3): 4 mg via INTRAVENOUS
  Administered 2018-01-06 (×2): 2 mg via INTRAVENOUS
  Filled 2018-01-05 (×3): qty 2
  Filled 2018-01-05 (×2): qty 1

## 2018-01-05 NOTE — Progress Notes (Addendum)
eLink Physician-Brief Progress Note Patient Name: Cory MoralesRandall M Slimp Jr. DOB: [birth year] MRN: [id number]   Date of Service  01/05/2018  HPI/Events of Note  Agitated, sitting on bed . Vitals ok. But pulled PIV oit  eICU Interventions  statt geodon 20mg  IM x 1 Place OPIv Start precedex gtt     Intervention Category Minor Interventions: Agitation / anxiety - evaluation and management  Kalman ShanMurali Jemarcus Dougal 01/05/2018, 7:10 PM

## 2018-01-05 NOTE — Progress Notes (Signed)
At 1845, Pt became extremely combative. RN tried to reassure and redirect patient to no avail. Patient climbing out of bed, trying to strike staff, pulling off monitor leads. CCM notified of situation awaiting orders.

## 2018-01-05 NOTE — Progress Notes (Signed)
PULMONARY / CRITICAL CARE MEDICINE   Name: Cory MoralesRandall M Fester Jr. MRN: [id number] DOB: 01/21/1956    ADMISSION DATE:  01/01/2018 CONSULTATION DATE:  01/01/2018  REFERRING MD:  Dr. Fayrene FearingJames  CHIEF COMPLAINT:  Found down  BRIEF SUMMARY:   62 y/o male with a history of EtOH abuse and polystubstance abuse was admitted on 4/7 after he was found unresponsive at a hotel. Intubated in the ER.  Aspiration PNA and prolonged agitated delirium. Extubated 4/10.     SUBJECTIVE: RN reports weaning precedex (now on 0.4 mcg).  Intermittent agitation mixed with periods of sedation. Afebrile.    VITAL SIGNS: BP 134/90   Pulse 68   Temp 97.7 F (36.5 C) (Oral)   Resp (!) 9   Ht 6' (1.829 m)   Wt 265 lb 3.4 oz (120.3 kg)   SpO2 96%   BMI 35.97 kg/m   HEMODYNAMICS:    VENTILATOR SETTINGS:    INTAKE / OUTPUT: I/O last 3 completed shifts: In: 4737.3 [P.O.:360; I.V.:3027.3; NG/GT:750; IV Piggyback:600] Out: 860 [Urine:860]  PHYSICAL EXAMINATION: General: chronically ill appearing male in NAD lying in bed, 4 point restraints  HEENT: MM pink/dry, crusting to tongue / lips  PSY: calm / easily reoriented  Neuro: Awakens, alert, oriented to self, re-directs  CV: s1s2 rrr, no m/r/g PULM: even/non-labored, lungs bilaterally clear BJ:YNWGGI:soft, non-tender, bsx4 active  Extremities: warm/dry, no edema  Skin: multiple scattered abrasions / bruises to skin  LABS:  BMET Recent Labs  Lab 01/03/18 0523 01/04/18 0332 01/05/18 0311  NA 142 146* 149*  K 4.0 3.4* 3.4*  CL 109 108 112*  CO2 22 27 27   BUN 15 20 18   CREATININE 0.94 1.00 0.83  GLUCOSE 169* 183* 142*    Electrolytes Recent Labs  Lab 01/02/18 1716 01/03/18 0523 01/03/18 1655 01/04/18 0332 01/05/18 0311  CALCIUM  --  8.1*  --  8.3* 8.4*  MG 1.2* 1.2* 1.9  --   --   PHOS 1.9* 1.6* 2.0*  --   --     CBC Recent Labs  Lab 01/01/18 1340 01/03/18 0523 01/04/18 0332  WBC 11.9* 7.4 6.6  HGB 17.6* 12.8* 12.0*  HCT 51.4 39.0 36.5*   PLT 325 100* 97*    Coag's No results for input(s): APTT, INR in the last 168 hours.  Sepsis Markers Recent Labs  Lab 01/02/18 0021 01/02/18 0033 01/02/18 0334 01/02/18 1442  LATICACIDVEN  --  3.2* 4.1* 2.1*  PROCALCITON <0.10  --   --   --     ABG Recent Labs  Lab 01/01/18 2045 01/01/18 2215 01/02/18 0020  PHART 7.212* 7.225* 7.342*  PCO2ART 60.9* 52.8* 40.3  PO2ART 131* 292* 73.7*    Liver Enzymes Recent Labs  Lab 01/01/18 1340  AST 96*  ALT 75*  ALKPHOS 79  BILITOT 0.6  ALBUMIN 4.0    Cardiac Enzymes No results for input(s): TROPONINI, PROBNP in the last 168 hours.  Glucose Recent Labs  Lab 01/04/18 1147 01/04/18 1632 01/04/18 1934 01/04/18 2337 01/05/18 0329 01/05/18 0732  GLUCAP 195* 169* 141* 133* 132* 141*    Imaging No results found.   CULTURES: Sputum culture 4/8 >> UA 4/8 >> negative Blood cultures 4/8 >>  ANTIBIOTICS: Zosyn 4/7 >> 4/9 Unasyn 4/9 >>   SIGNIFICANT EVENTS: 4/07 Admit, found down in hotel room, EtOH, intubated for hypercapnea & airway protection  4/09 Weaning precedex off, added back home klonopin   LINES/TUBES: PIV's ETT 4/7 >> Foley catheter 4/7 >> NG  tube 4/7 >>   DISCUSSION: 62 y/o male with depression, polysubstance abuse, GERD admitted with acute respiratory failure with hypoxemia in the setting of polysubstance overdose.  Developed severe hypoxemia and aspiration pneumonia.  Extubated 4/10.    ASSESSMENT / PLAN:  PULMONARY A: Acute respiratory failure with hypoxemia Aspiration pneumonia P:   Pulmonary hygiene - IS, mobilize O2 to support sats 90-95% Aspiration precautions  Follow intermittent CXR  CARDIOVASCULAR A:  No acute issues P:  Tele monitoring   RENAL A:   No acute issues P:   Trend BMP / urinary output Replace electrolytes as indicated Avoid nephrotoxic agents, ensure adequate renal perfusion NS @ 54ml/hr until taking PO's   GASTROINTESTINAL A:   No acute issues P:    NPO if not alert.  Otherwise advance diet as tolerated  HEMATOLOGIC A:   No acute issues P:  Trend CBC  Heparin SQ for DVT prophylaxis   INFECTIOUS A:   Aspiration pneumonia P:   Continue unasyn, D3.  Plan for 5 days / stop date in place Follow cultures    ENDOCRINE A:   DM P:   Hold home januvia, lantus, glucophage Moderate SSI   NEUROLOGIC A:   Polysubstance Abuse Agitated Delirium  Baseline Benzodiazepine Dependent  P:   Wean precedex gtt to off after klonopin given OOB to chair if able  Resume home klonopin, change to ODT given intermittent agitation   FAMILY  - Updates:  No family at bedside 4/11.    - Inter-disciplinary family meet or Palliative Care meeting due by:  day 7  - Global: change to SDU status, transfer to [facility] as of 4/12 am.   Canary Brim, NP-C [facility] Pgr: [phone] or if no answer [phone] 01/05/2018, 11:15 AM

## 2018-01-05 NOTE — Progress Notes (Signed)
Upon start of shift, pt was sitting up in bed with legs hanging off the side in 2pt restraints, being verbally abusive to staff, forcefully yanking at restraints, shaking the bed, and threatening to get out of bed. Elink MD was called and gave orders for bilat wrist restraints and a one time dose of 20mg  Geodon IM (pt had pulled out his only IV).  Pt continued to escalate and become more aggressive. Security was called for help to hold pt down to give Geodon. Elink MD was called again; this RN requested 4pt restraints to keep the pt safe in bed. Security arrived, placed pt in bed w/ 4pt restraints. Geodon given in R deltoid.  Pt resting in bed now. Will continue to monitor.

## 2018-01-05 NOTE — Progress Notes (Signed)
Nutrition Follow-up  DOCUMENTATION CODES:   Obesity unspecified  INTERVENTION:  - Diet advancement as medically feasible. - RD will monitor for nutrition-related needs at follow-up.  NUTRITION DIAGNOSIS:   Inadequate oral intake related to inability to eat as evidenced by NPO status. -ongoing  GOAL:   Patient will meet greater than or equal to 90% of their needs -unmet/unable to meet at this time.   MONITOR:   Diet advancement, Weight trends, Labs  ASSESSMENT:   62 yo male with hx of DM, HTN, depression, Etoh abuse, polysubstance abuse, GERD, psoriasis. He lives in a hotel and was found 4/7 AM unresponsive by housekeeping in his hotel room. Reportedly he was slumped over a desk and had soiled himself. The housekeeping left the room without intervention. They came back 2 hours later and found him still unresponsive so called 911. He was brought to the ER where he was found to be unresponsive or minimally responsive, opening eyes only to pain, documented as tachycardic and tachypneic with increased work of breathing, no gag. Patient required intubation for acute hypercapneic respiratory failure and failure to protect his airway.   Weight trending up since admission and weight is now +10 lbs/4.2 kg since admission. Pt was extubated yesterday at around 10:15 AM and OGT removed at that time. Pt remains NPO s/p extubation. Pt is disoriented x4.  Per Dr. Ulyses JarredMcQuaid's note yesterday AM: aspiration PNA to remain on aspiration precautions s/p extubation, plan to advance diet s/p extubation, monitor for withdrawals.   Medications reviewed; sliding scale Novolog, liquid multivitamin ordered (unable to be given with pt NPO and OGT removed). Labs reviewed; CBGs: 132 and 141 mg/dL this AM, Na: 161149 mmol/L, K: 3.4 mmol/L, Cl: 112 mmol/L, Ca: 8.4 mg/dL.  IVF: NS @ 75 mL/hr.     Diet Order:  No diet orders on file  EDUCATION NEEDS:   No education needs have been identified at this time  Skin:   Skin Assessment: Reviewed RN Assessment  Last BM:  PTA/unknown  Height:   Ht Readings from Last 1 Encounters:  01/01/18 6' (1.829 m)    Weight:   Wt Readings from Last 1 Encounters:  01/05/18 265 lb 3.4 oz (120.3 kg)    Ideal Body Weight:  80.91 kg  BMI:  Body mass index is 35.97 kg/m.  Estimated Nutritional Needs:   Kcal:  [id number] (17-19 kcal/kg)  Protein:  115-128 grams (1-1.1 grams/kg)  Fluid:  >/= 2 L/day      Trenton GammonJessica Shamia Uppal, MS, RD, LDN, [facility] Inpatient Clinical Dietitian Pager # [phone] After hours/weekend pager # [phone]

## 2018-01-06 ENCOUNTER — Inpatient Hospital Stay (HOSPITAL_COMMUNITY): Payer: Medicare Other

## 2018-01-06 ENCOUNTER — Other Ambulatory Visit: Payer: Self-pay

## 2018-01-06 LAB — CBC
HEMATOCRIT: 34.9 % — AB (ref 39.0–52.0)
HEMOGLOBIN: 11.3 g/dL — AB (ref 13.0–17.0)
MCH: 36 pg — ABNORMAL HIGH (ref 26.0–34.0)
MCHC: 32.4 g/dL (ref 30.0–36.0)
MCV: 111.1 fL — AB (ref 78.0–100.0)
Platelets: 92 10*3/uL — ABNORMAL LOW (ref 150–400)
RBC: 3.14 MIL/uL — AB (ref 4.22–5.81)
RDW: 17.9 % — AB (ref 11.5–15.5)
WBC: 4.1 10*3/uL (ref 4.0–10.5)

## 2018-01-06 LAB — BASIC METABOLIC PANEL
ANION GAP: 11 (ref 5–15)
BUN: 16 mg/dL (ref 6–20)
CHLORIDE: 110 mmol/L (ref 101–111)
CO2: 27 mmol/L (ref 22–32)
Calcium: 8.6 mg/dL — ABNORMAL LOW (ref 8.9–10.3)
Creatinine, Ser: 0.83 mg/dL (ref 0.61–1.24)
GFR calc Af Amer: >60 mL/min (ref >60)
GFR calc non Af Amer: >60 mL/min (ref >60)
GLUCOSE: 159 mg/dL — AB (ref 65–99)
POTASSIUM: 3.5 mmol/L (ref 3.5–5.1)
Sodium: 148 mmol/L — ABNORMAL HIGH (ref 135–145)

## 2018-01-06 LAB — GLUCOSE, CAPILLARY
GLUCOSE-CAPILLARY: 117 mg/dL — AB (ref 65–99)
Glucose-Capillary: 100 mg/dL — ABNORMAL HIGH (ref 65–99)
Glucose-Capillary: 108 mg/dL — ABNORMAL HIGH (ref 65–99)
Glucose-Capillary: 144 mg/dL — ABNORMAL HIGH (ref 65–99)
Glucose-Capillary: 153 mg/dL — ABNORMAL HIGH (ref 65–99)
Glucose-Capillary: 160 mg/dL — ABNORMAL HIGH (ref 65–99)

## 2018-01-06 MED ORDER — ZIPRASIDONE HCL 20 MG PO CAPS
20.0000 mg | ORAL_CAPSULE | Freq: Two times a day (BID) | ORAL | Status: DC
  Filled 2018-01-06: qty 1

## 2018-01-06 MED ORDER — LORAZEPAM 2 MG/ML IJ SOLN
2.0000 mg | INTRAMUSCULAR | Status: DC | PRN
  Administered 2018-01-06: 3 mg via INTRAVENOUS
  Administered 2018-01-06: 2 mg via INTRAVENOUS
  Administered 2018-01-06 (×4): 3 mg via INTRAVENOUS
  Administered 2018-01-06: 2 mg via INTRAVENOUS
  Administered 2018-01-07 (×3): 3 mg via INTRAVENOUS
  Filled 2018-01-06: qty 1
  Filled 2018-01-06: qty 2
  Filled 2018-01-06: qty 1
  Filled 2018-01-06 (×7): qty 2

## 2018-01-06 MED ORDER — HALOPERIDOL LACTATE 5 MG/ML IJ SOLN
INTRAMUSCULAR | Status: AC
  Administered 2018-01-06: 5 mg via INTRAVENOUS
  Filled 2018-01-06: qty 1

## 2018-01-06 MED ORDER — ZIPRASIDONE HCL 20 MG PO CAPS
20.0000 mg | ORAL_CAPSULE | Freq: Two times a day (BID) | ORAL | Status: DC
  Administered 2018-01-06 – 2018-01-10 (×6): 20 mg via ORAL
  Filled 2018-01-06 (×9): qty 1

## 2018-01-06 MED ORDER — HALOPERIDOL LACTATE 5 MG/ML IJ SOLN
5.0000 mg | Freq: Four times a day (QID) | INTRAMUSCULAR | Status: DC | PRN
  Administered 2018-01-06 – 2018-01-16 (×5): 5 mg via INTRAVENOUS
  Filled 2018-01-06 (×5): qty 1

## 2018-01-06 MED ORDER — IPRATROPIUM-ALBUTEROL 0.5-2.5 (3) MG/3ML IN SOLN
3.0000 mL | Freq: Four times a day (QID) | RESPIRATORY_TRACT | Status: DC
  Administered 2018-01-07 – 2018-01-09 (×9): 3 mL via RESPIRATORY_TRACT
  Filled 2018-01-06 (×10): qty 3

## 2018-01-06 NOTE — Progress Notes (Addendum)
PULMONARY / CRITICAL CARE MEDICINE   Name: Cory Foster. MRN: [id number] DOB: [birth year]    ADMISSION DATE:  01/01/2018 CONSULTATION DATE:  01/01/2018  REFERRING MD:  Dr. Fayrene Fearing  CHIEF COMPLAINT:  Found down  BRIEF SUMMARY:   62 y/o male with a history of EtOH abuse and polystubstance abuse was admitted on 4/7 after he was found unresponsive at a hotel. Intubated in the ER.  Aspiration PNA and prolonged agitated delirium. Extubated 4/10.     SUBJECTIVE:  RN reports pt became agitated overnight, required precedex gtt (now off).  States patient becomes agitated every afternoon around 5pm.  Has periods of calmness mixed with agitation.  Pt reports he smokes.  VITAL SIGNS: BP (!) 152/96   Pulse 63   Temp (!) 97.4 F (36.3 C) (Oral)   Resp 12   Ht 6' (1.829 m)   Wt 266 lb 15.6 oz (121.1 kg)   SpO2 95%   BMI 36.21 kg/m   HEMODYNAMICS:    VENTILATOR SETTINGS:    INTAKE / OUTPUT: I/O last 3 completed shifts: In: 3828.6 [I.V.:3228.6; IV Piggyback:600] Out: 1750 [Urine:1750]  PHYSICAL EXAMINATION: General: adult male lying in bed HEENT: MM pink/dry PSY: calm Neuro: Awake, alert, oriented to self only, MAE CV: s1s2 rrr, no m/r/g PULM: even/non-labored, lungs bilaterally with wheezing WU:JWJX, non-tender, bsx4 active  Extremities: warm/dry, no edema  Skin: no rashes or lesions   LABS:  BMET Recent Labs  Lab 01/04/18 0332 01/05/18 0311 01/06/18 0329  NA 146* 149* 148*  K 3.4* 3.4* 3.5  CL 108 112* 110  CO2 27 27 27   BUN 20 18 16   CREATININE 1.00 0.83 0.83  GLUCOSE 183* 142* 159*    Electrolytes Recent Labs  Lab 01/02/18 1716 01/03/18 0523 01/03/18 1655 01/04/18 0332 01/05/18 0311 01/06/18 0329  CALCIUM  --  8.1*  --  8.3* 8.4* 8.6*  MG 1.2* 1.2* 1.9  --   --   --   PHOS 1.9* 1.6* 2.0*  --   --   --     CBC Recent Labs  Lab 01/03/18 0523 01/04/18 0332 01/06/18 0329  WBC 7.4 6.6 4.1  HGB 12.8* 12.0* 11.3*  HCT 39.0 36.5* 34.9*  PLT 100*  97* 92*    Coag's No results for input(s): APTT, INR in the last 168 hours.  Sepsis Markers Recent Labs  Lab 01/02/18 0021 01/02/18 0033 01/02/18 0334 01/02/18 1442  LATICACIDVEN  --  3.2* 4.1* 2.1*  PROCALCITON <0.10  --   --   --     ABG Recent Labs  Lab 01/01/18 2045 01/01/18 2215 01/02/18 0020  PHART 7.212* 7.225* 7.342*  PCO2ART 60.9* 52.8* 40.3  PO2ART 131* 292* 73.7*    Liver Enzymes Recent Labs  Lab 01/01/18 1340  AST 96*  ALT 75*  ALKPHOS 79  BILITOT 0.6  ALBUMIN 4.0    Cardiac Enzymes No results for input(s): TROPONINI, PROBNP in the last 168 hours.  Glucose Recent Labs  Lab 01/05/18 1126 01/05/18 1529 01/05/18 1951 01/05/18 2324 01/06/18 0321 01/06/18 0736  GLUCAP 151* 120* 165* 136* 153* 160*    Imaging Dg Chest Port 1 View  Result Date: 01/06/2018 CLINICAL DATA:  Follow-up aspiration pneumonia EXAM: PORTABLE CHEST 1 VIEW COMPARISON:  01/04/2018 FINDINGS: Endotracheal tube and nasogastric catheter have been removed in the interval. The cardiac shadow is stable. Poor inspiratory effort is noted with crowding of the vascular markings. Mild bibasilar atelectatic changes are seen. No bony abnormality is noted.  IMPRESSION: Poor inspiratory effort with vascular crowding. Mild bibasilar atelectatic changes are noted. Electronically Signed   By: Alcide CleverMark  Lukens M.D.   On: 01/06/2018 07:52     CULTURES: Sputum culture 4/8 >> UA 4/8 >> negative Blood cultures 4/8 >>  ANTIBIOTICS: Zosyn 4/7 >> 4/9 Unasyn 4/9 >>   SIGNIFICANT EVENTS: 4/07 Admit, found down in hotel room, EtOH, intubated for hypercapnea & airway protection  4/09 Weaning precedex off, added back home klonopin   LINES/TUBES: PIV's ETT 4/7 >> 4/10 Foley catheter 4/7 >> NG tube 4/7 >> 4/10   DISCUSSION: 62 y/o male with depression, polysubstance abuse, GERD admitted with acute respiratory failure with hypoxemia in the setting of polysubstance overdose.  Developed severe  hypoxemia and aspiration pneumonia.  Extubated 4/10.    ASSESSMENT / PLAN:  PULMONARY A: Acute respiratory failure with hypoxemia Aspiration pneumonia P:   Pulmonary hygiene - IS, mobilize Continue unasyn, D4/5.  Stop date in place Follow cultures O2 if needed to support sats 90-95% Add Duoneb Q6  Aspiration precautions  Intermittent CXR Decrease IVF to 20 ml/hr.  May need lasix.    ENDOCRINE A:   DM P:   Per Primary   NEUROLOGIC A:   Polysubstance Abuse Agitated Delirium  Baseline Benzodiazepine Dependent  P:   Added SDU CIWA protocol  Discontinue precedex  Continue home klonopin BID (changed to ODT as he has had intermittent agitation) OOB to chair as able   FAMILY  - Updates:  No family at bedside 4/12 am.      - Global: TRH primary SVC.  PCCM will be available PRN.  Please call back if new needs arise.    Canary BrimBrandi Marliss Buttacavoli, NP-C [facility] Pgr: [phone] or if no answer [phone] 01/06/2018, 10:37 AM

## 2018-01-06 NOTE — Progress Notes (Signed)
eLink Physician-Brief Progress Note Patient Name: Hal MoralesRandall M Jellison Jr. DOB: 1956-09-19 MRN: [id number]   Date of Service  01/06/2018  HPI/Events of Note  600cc urine in bladder  eICU Interventions  Inert foley     Intervention Category Intermediate Interventions: Other:  Kalman ShanMurali Braydon Kullman 01/06/2018, 3:37 AM

## 2018-01-07 LAB — BLOOD GAS, ARTERIAL
ACID-BASE EXCESS: 5.1 mmol/L — AB (ref 0.0–2.0)
BICARBONATE: 29.8 mmol/L — AB (ref 20.0–28.0)
Drawn by: 257701
FIO2: 45
O2 Saturation: 93.1 %
PO2 ART: 68.5 mmHg — AB (ref 83.0–108.0)
Patient temperature: 98.6
pCO2 arterial: 45.7 mmHg (ref 32.0–48.0)
pH, Arterial: 7.429 (ref 7.350–7.450)

## 2018-01-07 LAB — CULTURE, BLOOD (ROUTINE X 2)
CULTURE: NO GROWTH
Culture: NO GROWTH
Special Requests: ADEQUATE
Special Requests: ADEQUATE

## 2018-01-07 LAB — GLUCOSE, CAPILLARY
GLUCOSE-CAPILLARY: 132 mg/dL — AB (ref 65–99)
GLUCOSE-CAPILLARY: 127 mg/dL — AB (ref 65–99)
GLUCOSE-CAPILLARY: 132 mg/dL — AB (ref 65–99)
Glucose-Capillary: 131 mg/dL — ABNORMAL HIGH (ref 65–99)
Glucose-Capillary: 128 mg/dL — ABNORMAL HIGH (ref 65–99)

## 2018-01-07 MED ORDER — LABETALOL HCL 5 MG/ML IV SOLN
20.0000 mg | INTRAVENOUS | Status: DC | PRN
  Administered 2018-01-07: 20 mg via INTRAVENOUS
  Filled 2018-01-07 (×2): qty 4

## 2018-01-07 MED ORDER — LORAZEPAM 2 MG/ML IJ SOLN
0.5000 mg | INTRAMUSCULAR | Status: DC | PRN
  Administered 2018-01-08: 0.5 mg via INTRAVENOUS
  Administered 2018-01-08: 2 mg via INTRAVENOUS
  Filled 2018-01-07 (×2): qty 1

## 2018-01-07 MED ORDER — DEXMEDETOMIDINE HCL IN NACL 200 MCG/50ML IV SOLN
0.4000 ug/kg/h | INTRAVENOUS | Status: DC
  Administered 2018-01-07: 0.4 ug/kg/h via INTRAVENOUS
  Administered 2018-01-09 (×2): 0.8 ug/kg/h via INTRAVENOUS
  Administered 2018-01-09: 0.4 ug/kg/h via INTRAVENOUS
  Administered 2018-01-09: 1 ug/kg/h via INTRAVENOUS
  Administered 2018-01-09: 0.4 ug/kg/h via INTRAVENOUS
  Administered 2018-01-10 (×2): 0.8 ug/kg/h via INTRAVENOUS
  Filled 2018-01-07: qty 50
  Filled 2018-01-07: qty 100
  Filled 2018-01-07 (×6): qty 50

## 2018-01-07 NOTE — Progress Notes (Signed)
PULMONARY / CRITICAL CARE MEDICINE   Name: Cory Foster. MRN: [id number] DOB: 1956/05/10    ADMISSION DATE:  01/01/2018 CONSULTATION DATE:  01/01/2018 REFERRING MD:  Fayrene Fearing, ED.  CHIEF COMPLAINT:  Found down.  HISTORY OF PRESENT ILLNESS:   62 y/o male with a history of EtOH abuse and polystubstance abuse was admitted on 4/7 after he was found unresponsive at a hotel. Intubated in the ER.  Aspiration PNA and prolonged agitated delirium. Extubated 4/10.      PAST MEDICAL HISTORY :  He  has a past medical history of Anxiety, DDD (degenerative disc disease), Depression, GERD (gastroesophageal reflux disease), Hyperlipidemia, Hypertension, Psoriasis, Substance abuse (HCC), Substance induced mood disorder (HCC), and Varicose vein.  PAST SURGICAL HISTORY: He  has a past surgical history that includes Vein ligation and stripping; Cholecystectomy; and Sinusotomy.  Allergies  Allergen Reactions  . Ceclor [Cefaclor]     unknown  . Ciprofloxacin     unknown  . Citalopram     Hyper   . Seroquel [Quetiapine Fumarate]     "hung over" feeling    No current facility-administered medications on file prior to encounter.    Current Outpatient Medications on File Prior to Encounter  Medication Sig  . atorvastatin (LIPITOR) 80 MG tablet Take 80 mg by mouth daily.   . Cholecalciferol (VITAMIN D-3) 5000 UNITS TABS Take 5,000 Units by mouth daily.   . clonazePAM (KLONOPIN) 2 MG tablet Take 1 tablet (2 mg total) by mouth 2 (two) times daily.  . fluticasone (FLONASE) 50 MCG/ACT nasal spray Place 1 spray into both nostrils daily as needed for allergies or rhinitis.  Marland Kitchen insulin glargine (LANTUS) 100 UNIT/ML injection Inject 15 Units into the skin at bedtime.  . Magnesium 250 MG TABS Take 250 mg by mouth daily.   . metFORMIN (GLUCOPHAGE) 500 MG tablet Take 1,000 mg by mouth 2 (two) times daily with a meal.   . Multiple Vitamin (MULTIVITAMIN WITH MINERALS) TABS tablet Take 1 tablet by mouth every  morning.  . sitaGLIPtin (JANUVIA) 50 MG tablet Take 50 mg by mouth daily.  . sertraline (ZOLOFT) 100 MG tablet Take 1 tablet (100 mg total) by mouth 2 (two) times daily. For depression/anxiety (Patient not taking: Reported on 01/01/2018)    FAMILY HISTORY:  His indicated that his mother is deceased. He indicated that his father is deceased.   SOCIAL HISTORY: He  reports that he has been smoking cigarettes.  He has been smoking about 0.50 packs per day. He has never used smokeless tobacco. He reports that he drinks alcohol. He reports that he does not use drugs.   SUBJECTIVE:  Patient was initially intubated.  And treated for an aspiration pneumonia.  He was eventually extubated.  He has been having issues with periodic agitation usually in the evenings.  He has required significant doses of lorazepam yesterday evening and has been somnolent todaY.  When evaluated this afternoon he is more awake.  Remains confused.  But denies any specific pain or discomfort.  VITAL SIGNS: BP (!) 147/74   Pulse (!) 56   Temp 98.4 F (36.9 C) (Axillary)   Resp 20   Ht 6' (1.829 m)   Wt 269 lb 2.9 oz (122.1 kg)   SpO2 96%   BMI 36.51 kg/m   HEMODYNAMICS:  Currently on no vasopressor medications.  VENTILATOR SETTINGS: FiO2 (%):  [40 %] 40 % continues to require oxygen.  Patient is bedbound.  INTAKE / OUTPUT: I/O last 3  completed shifts: In: 1698.5 [P.O.:60; I.V.:1138.5; IV Piggyback:500] Out: 4845 [Urine:4845]  PHYSICAL EXAMINATION: General: Morbidly obese disheveled appearing man.  Somnolent most of the time.  Will occasionally interact appropriately. Neuro: No focal neurological deficits. HEENT: Trachea is midline.  No scleral icterus. Cardiovascular: JVP is not elevated.  First and second heart sounds are unremarkable.  No edema. Lungs: Chest is clear to auscultation bilaterally. Abdomen: Abdomen is soft and nontender with no organomegaly or masses. Musculoskeletal: No active joint  disease. Skin: Skin remains intact.  LABS:  BMET Recent Labs  Lab 01/04/18 0332 01/05/18 0311 01/06/18 0329  NA 146* 149* 148*  K 3.4* 3.4* 3.5  CL 108 112* 110  CO2 27 27 27   BUN 20 18 16   CREATININE 1.00 0.83 0.83  GLUCOSE 183* 142* 159*    Electrolytes Recent Labs  Lab 01/02/18 1716 01/03/18 0523 01/03/18 1655 01/04/18 0332 01/05/18 0311 01/06/18 0329  CALCIUM  --  8.1*  --  8.3* 8.4* 8.6*  MG 1.2* 1.2* 1.9  --   --   --   PHOS 1.9* 1.6* 2.0*  --   --   --     CBC Recent Labs  Lab 01/03/18 0523 01/04/18 0332 01/06/18 0329  WBC 7.4 6.6 4.1  HGB 12.8* 12.0* 11.3*  HCT 39.0 36.5* 34.9*  PLT 100* 97* 92*    Coag's No results for input(s): APTT, INR in the last 168 hours.  Sepsis Markers Recent Labs  Lab 01/02/18 0021 01/02/18 0033 01/02/18 0334 01/02/18 1442  LATICACIDVEN  --  3.2* 4.1* 2.1*  PROCALCITON <0.10  --   --   --     ABG Recent Labs  Lab 01/01/18 2215 01/02/18 0020 01/07/18 1148  PHART 7.225* 7.342* 7.429  PCO2ART 52.8* 40.3 45.7  PO2ART 292* 73.7* 68.5*    Liver Enzymes Recent Labs  Lab 01/01/18 1340  AST 96*  ALT 75*  ALKPHOS 79  BILITOT 0.6  ALBUMIN 4.0    Cardiac Enzymes No results for input(s): TROPONINI, PROBNP in the last 168 hours.  Glucose Recent Labs  Lab 01/06/18 2341 01/07/18 0344 01/07/18 0739 01/07/18 1220 01/07/18 1641 01/07/18 2002  GLUCAP 100* 131* 132* 127* 132* 128*    Imaging No results found.   ANTIBIOTICS: Has completed course of antibiotics for aspiration pneumonia  SIGNIFICANT EVENTS: Overnight agitation  LINES/TUBES: Peripheral IV and urethral catheter.  Sites appear intact.  DISCUSSION: Ongoing delirium following alcohol abuse.  He is now out of the period of withdrawal.  He now has significant sleep-wake reversal with agitated delirium (sundowning).  We will attempt to restore his normal sleep-wake cycle anticipatory use of ziprasidone and dexmedetomidine in the late  afternoon.  ASSESSMENT / PLAN:  PULMONARY A: Currently hypoxic respiratory insufficiency due to atelectasis. P:   Continue oxygen therapy and encourage incentive spirometry when compliant.  CARDIOVASCULAR A:  Normotensive in sinus rhythm.  Hypertensive. P:  No cardiac therapies at this time.  When cleared for swallowing will initiate antihypertensive medication  RENAL A:   Clinically euvolemic.  Normal renal function. P:   Lower ongoing volume autoregulation.  Foley catheter required to measure urine output and for skin protection.  GASTROINTESTINAL A:   At low nutritional risk.  Abdomen is soft nontender. P:   P.o. for now until mental status clears.  HEMATOLOGIC A:   Anemia of chronic disease.  Mild thrombocytopenia likely related to alcohol use. P:  No indications for transfusion  INFECTIOUS A:   No leukocytosis.  Complete  therapy for aspiration pneumonia. P:   Antibody therapy at this time.  ENDOCRINE A:   Glycemic on correction sliding scale only. P:   Continue current strategy.  NEUROLOGIC A:   Agitated delirium with sundowning P:   Preemptive nighttime sedation to avoid daytime somnolence and to restore normal sleep wake cycle.   FAMILY  - Updates: No family has been present    Pulmonary and Critical Care Medicine [facility] Pager: [phone]  01/07/2018, 8:35 PM

## 2018-01-07 NOTE — Progress Notes (Signed)
Pt awake and able to tell me his name, that [facility] and that he is here because he "fell out". Pt also asking if his son has been here.

## 2018-01-07 NOTE — Progress Notes (Signed)
PRN labetalol given for increased b/p 182/52

## 2018-01-08 LAB — GLUCOSE, CAPILLARY
GLUCOSE-CAPILLARY: 107 mg/dL — AB (ref 65–99)
GLUCOSE-CAPILLARY: 136 mg/dL — AB (ref 65–99)
GLUCOSE-CAPILLARY: 140 mg/dL — AB (ref 65–99)
GLUCOSE-CAPILLARY: 147 mg/dL — AB (ref 65–99)
Glucose-Capillary: 123 mg/dL — ABNORMAL HIGH (ref 65–99)
Glucose-Capillary: 150 mg/dL — ABNORMAL HIGH (ref 65–99)
Glucose-Capillary: 136 mg/dL — ABNORMAL HIGH (ref 65–99)

## 2018-01-08 NOTE — Progress Notes (Signed)
PULMONARY / CRITICAL CARE MEDICINE   Name: Cory Foster. MRN: [id number] DOB: 05-16-1956    ADMISSION DATE:  01/01/2018 CONSULTATION DATE:  01/01/2018 REFERRING MD:  Fayrene Fearing, ED.  CHIEF COMPLAINT:  Found down.  HISTORY OF PRESENT ILLNESS:   62 y/o male with a history of EtOH abuse and polystubstance abuse was admitted on 4/7 after he was found unresponsive at a hotel. Intubated in the ER.  Aspiration PNA and prolonged agitated delirium. Extubated 4/10.    Continues to have delirium with significant sundowning.  PAST MEDICAL HISTORY :  He  has a past medical history of Anxiety, DDD (degenerative disc disease), Depression, GERD (gastroesophageal reflux disease), Hyperlipidemia, Hypertension, Psoriasis, Substance abuse (HCC), Substance induced mood disorder (HCC), and Varicose vein.  PAST SURGICAL HISTORY: He  has a past surgical history that includes Vein ligation and stripping; Cholecystectomy; and Sinusotomy.  Allergies  Allergen Reactions  . Ceclor [Cefaclor]     unknown  . Ciprofloxacin     unknown  . Citalopram     Hyper   . Seroquel [Quetiapine Fumarate]     "hung over" feeling    No current facility-administered medications on file prior to encounter.    Current Outpatient Medications on File Prior to Encounter  Medication Sig  . atorvastatin (LIPITOR) 80 MG tablet Take 80 mg by mouth daily.   . Cholecalciferol (VITAMIN D-3) 5000 UNITS TABS Take 5,000 Units by mouth daily.   . clonazePAM (KLONOPIN) 2 MG tablet Take 1 tablet (2 mg total) by mouth 2 (two) times daily.  . fluticasone (FLONASE) 50 MCG/ACT nasal spray Place 1 spray into both nostrils daily as needed for allergies or rhinitis.  Marland Kitchen insulin glargine (LANTUS) 100 UNIT/ML injection Inject 15 Units into the skin at bedtime.  . Magnesium 250 MG TABS Take 250 mg by mouth daily.   . metFORMIN (GLUCOPHAGE) 500 MG tablet Take 1,000 mg by mouth 2 (two) times daily with a meal.   . Multiple Vitamin (MULTIVITAMIN WITH  MINERALS) TABS tablet Take 1 tablet by mouth every morning.  . sitaGLIPtin (JANUVIA) 50 MG tablet Take 50 mg by mouth daily.  . sertraline (ZOLOFT) 100 MG tablet Take 1 tablet (100 mg total) by mouth 2 (two) times daily. For depression/anxiety (Patient not taking: Reported on 01/01/2018)    FAMILY HISTORY:  His indicated that his mother is deceased. He indicated that his father is deceased.   SOCIAL HISTORY: He  reports that he has been smoking cigarettes.  He has been smoking about 0.50 packs per day. He has never used smokeless tobacco. He reports that he drinks alcohol. He reports that he does not use drugs.   SUBJECTIVE:  Patient was initially intubated.  And treated for an aspiration pneumonia.  He was eventually extubated.  He has been having issues with periodic agitation usually in the evenings.   More awake today than previously. Had been on Precedex overnight.    Remains confused.  But denies any specific pain or discomfort.  VITAL SIGNS: BP 130/62   Pulse 78   Temp 100.1 F (37.8 C) (Axillary)   Resp (!) 30   Ht 6' (1.829 m)   Wt 269 lb 2.9 oz (122.1 kg)   SpO2 92%   BMI 36.51 kg/m   HEMODYNAMICS:  Currently on no vasopressor medications.  VENTILATOR SETTINGS: FiO2 (%):  [40 %-50 %] 50 % continues to require oxygen.  Patient is bedbound.  INTAKE / OUTPUT: I/O last 3 completed shifts: In: 1272.5 [  P.O.:60; I.V.:912.5; IV Piggyback:300] Out: 4320 [Urine:4320]  PHYSICAL EXAMINATION: General: Morbidly obese disheveled appearing man.  Somnolent most of the time.  Will occasionally interact appropriately. Neuro: No focal neurological deficits. HEENT: Trachea is midline.  No scleral icterus. Cardiovascular: JVP is not elevated.  First and second heart sounds are unremarkable.  No edema. Lungs: Chest is clear to auscultation bilaterally. Abdomen: Abdomen is soft and nontender with no organomegaly or masses. Musculoskeletal: No active joint disease. Skin: Skin  remains intact.  LABS:  BMET Recent Labs  Lab 01/04/18 0332 01/05/18 0311 01/06/18 0329  NA 146* 149* 148*  K 3.4* 3.4* 3.5  CL 108 112* 110  CO2 27 27 27   BUN 20 18 16   CREATININE 1.00 0.83 0.83  GLUCOSE 183* 142* 159*    Electrolytes Recent Labs  Lab 01/02/18 1716 01/03/18 0523 01/03/18 1655 01/04/18 0332 01/05/18 0311 01/06/18 0329  CALCIUM  --  8.1*  --  8.3* 8.4* 8.6*  MG 1.2* 1.2* 1.9  --   --   --   PHOS 1.9* 1.6* 2.0*  --   --   --     CBC Recent Labs  Lab 01/03/18 0523 01/04/18 0332 01/06/18 0329  WBC 7.4 6.6 4.1  HGB 12.8* 12.0* 11.3*  HCT 39.0 36.5* 34.9*  PLT 100* 97* 92*    Coag's No results for input(s): APTT, INR in the last 168 hours.  Sepsis Markers Recent Labs  Lab 01/02/18 0021 01/02/18 0033 01/02/18 0334 01/02/18 1442  LATICACIDVEN  --  3.2* 4.1* 2.1*  PROCALCITON <0.10  --   --   --     ABG Recent Labs  Lab 01/01/18 2215 01/02/18 0020 01/07/18 1148  PHART 7.225* 7.342* 7.429  PCO2ART 52.8* 40.3 45.7  PO2ART 292* 73.7* 68.5*    Liver Enzymes Recent Labs  Lab 01/01/18 1340  AST 96*  ALT 75*  ALKPHOS 79  BILITOT 0.6  ALBUMIN 4.0    Cardiac Enzymes No results for input(s): TROPONINI, PROBNP in the last 168 hours.  Glucose Recent Labs  Lab 01/07/18 1220 01/07/18 1641 01/07/18 2002 01/08/18 0017 01/08/18 0333 01/08/18 0828  GLUCAP 127* 132* 128* 107* 123* 150*    Imaging No results found.   ANTIBIOTICS: Has completed course of antibiotics for aspiration pneumonia  SIGNIFICANT EVENTS: Overnight agitation  LINES/TUBES: Peripheral IV and urethral catheter.  Sites appear intact.  DISCUSSION: Ongoing delirium following alcohol abuse.  He is now out of the period of withdrawal.  He now has significant sleep-wake reversal with agitated delirium (sundowning).  We will attempt to restore his normal sleep-wake cycle anticipatory use of ziprasidone and dexmedetomidine in the late afternoon.  ASSESSMENT  / PLAN:  PULMONARY A: Currently hypoxic respiratory insufficiency due to atelectasis. P:   Continue oxygen therapy and encourage incentive spirometry when compliant.  CARDIOVASCULAR A:  Normotensive in sinus rhythm.  Hypertensive. P:  No cardiac therapies at this time.  When cleared for swallowing will initiate antihypertensive medication  RENAL A:   Clinically euvolemic.  Normal renal function. P:   Lower ongoing volume autoregulation.  Foley catheter required to measure urine output and for skin protection.  GASTROINTESTINAL A:   At low nutritional risk.  Abdomen is soft nontender. P:   P.o. for now until mental status clears.  HEMATOLOGIC A:   Anemia of chronic disease.  Mild thrombocytopenia likely related to alcohol use. P:  No indications for transfusion  INFECTIOUS A:   No leukocytosis.  Complete therapy for aspiration pneumonia.  P:   Antibody therapy at this time.  ENDOCRINE A:   Glycemic on correction sliding scale only. P:   Continue current strategy.  NEUROLOGIC A:   Agitated delirium with sundowning. Better today. May take several days to restore normal sleep wake cycle.  P:   Continue strategy of preemptive nighttime sedation to avoid daytime somnolence and to restore normal sleep wake cycle.   FAMILY  - Updates: No family has been present    Pulmonary and Critical Care Medicine [facility] Pager: [phone]  01/08/2018, 11:06 AM

## 2018-01-09 LAB — CBC WITH DIFFERENTIAL/PLATELET
BASOS PCT: 1 %
Basophils Absolute: 0 10*3/uL (ref 0.0–0.1)
EOS ABS: 0.1 10*3/uL (ref 0.0–0.7)
Eosinophils Relative: 2 %
HEMATOCRIT: 34.9 % — AB (ref 39.0–52.0)
HEMOGLOBIN: 11.6 g/dL — AB (ref 13.0–17.0)
Lymphocytes Relative: 23 %
Lymphs Abs: 1.3 10*3/uL (ref 0.7–4.0)
MCH: 36.1 pg — ABNORMAL HIGH (ref 26.0–34.0)
MCHC: 33.2 g/dL (ref 30.0–36.0)
MCV: 108.7 fL — ABNORMAL HIGH (ref 78.0–100.0)
MONOS PCT: 21 %
Monocytes Absolute: 1.2 10*3/uL — ABNORMAL HIGH (ref 0.1–1.0)
NEUTROS ABS: 3 10*3/uL (ref 1.7–7.7)
NEUTROS PCT: 53 %
Platelets: 120 10*3/uL — ABNORMAL LOW (ref 150–400)
RBC: 3.21 MIL/uL — AB (ref 4.22–5.81)
RDW: 18 % — ABNORMAL HIGH (ref 11.5–15.5)
WBC: 5.5 10*3/uL (ref 4.0–10.5)

## 2018-01-09 LAB — BASIC METABOLIC PANEL
ANION GAP: 12 (ref 5–15)
Anion gap: 12 (ref 5–15)
BUN: 11 mg/dL (ref 6–20)
BUN: 13 mg/dL (ref 6–20)
CALCIUM: 8.6 mg/dL — AB (ref 8.9–10.3)
CHLORIDE: 105 mmol/L (ref 101–111)
CO2: 23 mmol/L (ref 22–32)
CO2: 25 mmol/L (ref 22–32)
CREATININE: 1.02 mg/dL (ref 0.61–1.24)
CREATININE: 0.89 mg/dL (ref 0.61–1.24)
Calcium: 8.4 mg/dL — ABNORMAL LOW (ref 8.9–10.3)
Chloride: 105 mmol/L (ref 101–111)
GFR calc Af Amer: >60 mL/min (ref >60)
GFR calc non Af Amer: >60 mL/min (ref >60)
GFR calc non Af Amer: >60 mL/min (ref >60)
GLUCOSE: 187 mg/dL — AB (ref 65–99)
Glucose, Bld: 180 mg/dL — ABNORMAL HIGH (ref 65–99)
POTASSIUM: 2.9 mmol/L — AB (ref 3.5–5.1)
Potassium: 3.6 mmol/L (ref 3.5–5.1)
SODIUM: 140 mmol/L (ref 135–145)
Sodium: 142 mmol/L (ref 135–145)

## 2018-01-09 LAB — GLUCOSE, CAPILLARY
GLUCOSE-CAPILLARY: 132 mg/dL — AB (ref 65–99)
GLUCOSE-CAPILLARY: 124 mg/dL — AB (ref 65–99)
Glucose-Capillary: 161 mg/dL — ABNORMAL HIGH (ref 65–99)
Glucose-Capillary: 131 mg/dL — ABNORMAL HIGH (ref 65–99)
Glucose-Capillary: 188 mg/dL — ABNORMAL HIGH (ref 65–99)
Glucose-Capillary: 153 mg/dL — ABNORMAL HIGH (ref 65–99)

## 2018-01-09 LAB — PHOSPHORUS: Phosphorus: 3.3 mg/dL (ref 2.5–4.6)

## 2018-01-09 LAB — MAGNESIUM: MAGNESIUM: 1.4 mg/dL — AB (ref 1.7–2.4)

## 2018-01-09 MED ORDER — POTASSIUM CHLORIDE CRYS ER 20 MEQ PO TBCR
40.0000 meq | EXTENDED_RELEASE_TABLET | Freq: Two times a day (BID) | ORAL | Status: AC
  Administered 2018-01-09 – 2018-01-11 (×4): 40 meq via ORAL
  Filled 2018-01-09 (×4): qty 2

## 2018-01-09 MED ORDER — POTASSIUM CHLORIDE 10 MEQ/100ML IV SOLN
10.0000 meq | INTRAVENOUS | Status: AC
  Administered 2018-01-09 (×6): 10 meq via INTRAVENOUS
  Filled 2018-01-09 (×6): qty 100

## 2018-01-09 MED ORDER — IPRATROPIUM-ALBUTEROL 0.5-2.5 (3) MG/3ML IN SOLN: 3.0000 mL | Freq: Four times a day (QID) | RESPIRATORY_TRACT | Status: DC | PRN

## 2018-01-09 MED ORDER — CLONAZEPAM 1 MG PO TBDP
2.0000 mg | ORAL_TABLET | Freq: Every day | ORAL | Status: DC
  Administered 2018-01-09 – 2018-01-15 (×7): 2 mg via ORAL
  Filled 2018-01-09: qty 4
  Filled 2018-01-09: qty 2
  Filled 2018-01-09 (×3): qty 4
  Filled 2018-01-09: qty 2
  Filled 2018-01-09: qty 4

## 2018-01-09 MED ORDER — PREMIER PROTEIN SHAKE
11.0000 [oz_av] | ORAL | Status: DC
  Filled 2018-01-09: qty 325.31

## 2018-01-09 MED ORDER — DOCUSATE SODIUM 100 MG PO CAPS
100.0000 mg | ORAL_CAPSULE | Freq: Two times a day (BID) | ORAL | Status: DC
  Administered 2018-01-09 – 2018-01-16 (×12): 100 mg via ORAL
  Filled 2018-01-09 (×13): qty 1

## 2018-01-09 MED ORDER — POTASSIUM CHLORIDE 20 MEQ PO PACK: 40.0000 meq | PACK | Freq: Two times a day (BID) | ORAL | Status: DC

## 2018-01-09 MED ORDER — POTASSIUM CHLORIDE 10 MEQ/50ML IV SOLN: 10.0000 meq | INTRAVENOUS | Status: DC

## 2018-01-09 MED ORDER — POLYETHYLENE GLYCOL 3350 17 G PO PACK: 17.0000 g | PACK | Freq: Every day | ORAL | Status: DC | PRN

## 2018-01-09 NOTE — Progress Notes (Deleted)
Wife informed RN that she trashed all the patient's clothes (from admission). Will have a new set of clothes available at the time of discharge.

## 2018-01-09 NOTE — Progress Notes (Signed)
PULMONARY / CRITICAL CARE MEDICINE   Name: Hal MoralesRandall M Wik Jr. MRN: [id number] DOB: 11-02-1955    ADMISSION DATE:  01/01/2018 CONSULTATION DATE:  01/01/2018 REFERRING MD:  Fayrene FearingJames, ED.  CHIEF COMPLAINT:  Found down.  HISTORY OF PRESENT ILLNESS:   62 y/o male with a history of EtOH abuse and polystubstance abuse was admitted on 4/7 after he was found unresponsive at a hotel. Intubated in the ER.  Aspiration PNA and prolonged agitated delirium. Extubated 4/10.    Continues to have delirium with significant sundowning.  PAST MEDICAL HISTORY :  He  has a past medical history of Anxiety, DDD (degenerative disc disease), Depression, GERD (gastroesophageal reflux disease), Hyperlipidemia, Hypertension, Psoriasis, Substance abuse (HCC), Substance induced mood disorder (HCC), and Varicose vein.  PAST SURGICAL HISTORY: He  has a past surgical history that includes Vein ligation and stripping; Cholecystectomy; and Sinusotomy.  Allergies  Allergen Reactions  . Ceclor [Cefaclor]     unknown  . Ciprofloxacin     unknown  . Citalopram     Hyper   . Seroquel [Quetiapine Fumarate]     "hung over" feeling    No current facility-administered medications on file prior to encounter.    Current Outpatient Medications on File Prior to Encounter  Medication Sig  . atorvastatin (LIPITOR) 80 MG tablet Take 80 mg by mouth daily.   . Cholecalciferol (VITAMIN D-3) 5000 UNITS TABS Take 5,000 Units by mouth daily.   . clonazePAM (KLONOPIN) 2 MG tablet Take 1 tablet (2 mg total) by mouth 2 (two) times daily.  . fluticasone (FLONASE) 50 MCG/ACT nasal spray Place 1 spray into both nostrils daily as needed for allergies or rhinitis.  Marland Kitchen. insulin glargine (LANTUS) 100 UNIT/ML injection Inject 15 Units into the skin at bedtime.  . Magnesium 250 MG TABS Take 250 mg by mouth daily.   . metFORMIN (GLUCOPHAGE) 500 MG tablet Take 1,000 mg by mouth 2 (two) times daily with a meal.   . Multiple Vitamin (MULTIVITAMIN WITH  MINERALS) TABS tablet Take 1 tablet by mouth every morning.  . sitaGLIPtin (JANUVIA) 50 MG tablet Take 50 mg by mouth daily.  . sertraline (ZOLOFT) 100 MG tablet Take 1 tablet (100 mg total) by mouth 2 (two) times daily. For depression/anxiety (Patient not taking: Reported on 01/01/2018)    FAMILY HISTORY:  His indicated that his mother is deceased. He indicated that his father is deceased.   SOCIAL HISTORY: He  reports that he has been smoking cigarettes.  He has been smoking about 0.50 packs per day. He has never used smokeless tobacco. He reports that he drinks alcohol. He reports that he does not use drugs.   SUBJECTIVE:  Patient was initially intubated.  And treated for an aspiration pneumonia.  He was eventually extubated.  He has been having issues with periodic agitation usually in the evenings.   More awake today than previously. Had been on Precedex overnight.    Remains confused.  But denies any specific pain or discomfort. More alert today than previously. Received no additional lorazepam overnight.  VITAL SIGNS: BP 133/72   Pulse 91   Temp 97.9 F (36.6 C) (Oral)   Resp (!) 33   Ht 6' (1.829 m)   Wt 255 lb 8.2 oz (115.9 kg)   SpO2 92%   BMI 34.65 kg/m   HEMODYNAMICS:  Currently on no vasopressor medications.  VENTILATOR SETTINGS:   continues to require oxygen.  Patient is bedbound.  INTAKE / OUTPUT: I/O last 3  completed shifts: In: 1276.2 [P.O.:240; I.V.:1036.2] Out: 1550 [Urine:1550]  PHYSICAL EXAMINATION: General: Morbidly obese disheveled appearing man.  Somnolent most of the time.  Will occasionally interact appropriately. Neuro: No focal neurological deficits. HEENT: Trachea is midline.  No scleral icterus. Cardiovascular: JVP is not elevated.  First and second heart sounds are unremarkable.  No edema. Lungs: Chest is clear to auscultation bilaterally. Abdomen: Abdomen is soft and nontender with no organomegaly or masses. Musculoskeletal: No active  joint disease. Skin: Skin remains intact.  LABS:  BMET Recent Labs  Lab 01/05/18 0311 01/06/18 0329 01/09/18 0504  NA 149* 148* 140  K 3.4* 3.5 2.9*  CL 112* 110 105  CO2 27 27 23   BUN 18 16 11   CREATININE 0.83 0.83 1.02  GLUCOSE 142* 159* 180*    Electrolytes Recent Labs  Lab 01/03/18 0523 01/03/18 1655  01/05/18 0311 01/06/18 0329 01/09/18 0504  CALCIUM 8.1*  --    < > 8.4* 8.6* 8.4*  MG 1.2* 1.9  --   --   --  1.4*  PHOS 1.6* 2.0*  --   --   --  3.3   < > = values in this interval not displayed.    CBC Recent Labs  Lab 01/04/18 0332 01/06/18 0329 01/09/18 0504  WBC 6.6 4.1 5.5  HGB 12.0* 11.3* 11.6*  HCT 36.5* 34.9* 34.9*  PLT 97* 92* 120*    Coag's No results for input(s): APTT, INR in the last 168 hours.  Sepsis Markers No results for input(s): LATICACIDVEN, PROCALCITON, O2SATVEN in the last 168 hours.  ABG Recent Labs  Lab 01/07/18 1148  PHART 7.429  PCO2ART 45.7  PO2ART 68.5*    Liver Enzymes No results for input(s): AST, ALT, ALKPHOS, BILITOT, ALBUMIN in the last 168 hours.  Cardiac Enzymes No results for input(s): TROPONINI, PROBNP in the last 168 hours.  Glucose Recent Labs  Lab 01/08/18 1625 01/08/18 1936 01/08/18 2314 01/09/18 0318 01/09/18 0732 01/09/18 1119  GLUCAP 136* 140* 147* 161* 132* 131*    Imaging No results found.   ANTIBIOTICS: Has completed course of antibiotics for aspiration pneumonia  SIGNIFICANT EVENTS: Overnight agitation  LINES/TUBES: Peripheral IV and urethral catheter.  Sites appear intact.  DISCUSSION: Ongoing delirium following alcohol abuse.  He is now out of the period of withdrawal.  He now has significant sleep-wake reversal with agitated delirium (sundowning).  We will attempt to restore his normal sleep-wake cycle anticipatory use of ziprasidone and dexmedetomidine in the late afternoon.  ASSESSMENT / PLAN:  PULMONARY A: Currently hypoxic respiratory insufficiency due to  atelectasis. P:   Continue oxygen therapy and encourage incentive spirometry when compliant.  CARDIOVASCULAR A:  Normotensive in sinus rhythm.  Hypertensive. P:  No cardiac therapies at this time.  When cleared for swallowing will initiate antihypertensive medication  RENAL A:   Clinically euvolemic.  Normal renal function. P:   Lower ongoing volume autoregulation.  Foley catheter required to measure urine output and for skin protection.  GASTROINTESTINAL A:   At low nutritional risk.  Abdomen is soft nontender. P:   P.o. for now until mental status clears.  HEMATOLOGIC A:   Anemia of chronic disease.  Mild thrombocytopenia likely related to alcohol use. P:  No indications for transfusion  INFECTIOUS A:   No leukocytosis.  Complete therapy for aspiration pneumonia. P:   Antibody therapy at this time.  ENDOCRINE A:   Glycemic on correction sliding scale only. P:   Continue current strategy.  NEUROLOGIC A:  Agitated delirium with sundowning. Better today. May take several days to restore normal sleep wake cycle.  P:   Continue strategy of preemptive nighttime sedation to avoid daytime somnolence and to restore normal sleep wake cycle. - Decrease daytime sedatives. - PT consultation.   FAMILY  - Updates: No family has been present    Pulmonary and Critical Care Medicine [facility] Pager: [phone]  01/09/2018, 3:38 PM

## 2018-01-09 NOTE — Progress Notes (Signed)
Nutrition Follow-up  DOCUMENTATION CODES:   Obesity unspecified  INTERVENTION:  - Will order Premier Protein once/day, this supplement provides 160 kcal and 30 grams of protein.  - Continue to advancement diet as medically feasible. - Continue to encourage PO intakes.   NUTRITION DIAGNOSIS:   Inadequate oral intake related to inability to eat as evidenced by NPO status. -diet advanced to FLD on 4/11; diet not meeting needs  GOAL:   Patient will meet greater than or equal to 90% of their needs -unmet at this time.   MONITOR:   PO intake, Supplement acceptance, Weight trends, Labs  ASSESSMENT:   62 yo male with hx of DM, HTN, depression, Etoh abuse, polysubstance abuse, GERD, psoriasis. He lives in a hotel and was found 4/7 AM unresponsive by housekeeping in his hotel room. Reportedly he was slumped over a desk and had soiled himself. The housekeeping left the room without intervention. They came back 2 hours later and found him still unresponsive so called 911. He was brought to the ER where he was found to be unresponsive or minimally responsive, opening eyes only to pain, documented as tachycardic and tachypneic with increased work of breathing, no gag. Patient required intubation for acute hypercapneic respiratory failure and failure to protect his airway.   Weight mainly stable. Diet advanced from NPO to FLD on 4/11. Most recently, he consumed 75% of dinner last night. Will order Premier Protein to increase protein provision.   Medications reviewed; 100 mg Colace BID, sliding scale Novolog, daily multivitamin with minerals, 10 mEq IV KCl x6 runs today, 40 mEq oral KCl x2 doses today. Labs reviewed; CBGs: 161 and 132 mg/dL today, K: 2.9 mmol/L, Mg: 1.4 mg/dL, Ca: 8.4 mg/dL.   IVF: NS @ 20 mL/hr.     Diet Order:  Diet full liquid Room service appropriate? Yes; Fluid consistency: Thin  EDUCATION NEEDS:   No education needs have been identified at this time  Skin:  Skin  Assessment: Reviewed RN Assessment  Last BM:  PTA/unknown  Height:   Ht Readings from Last 1 Encounters:  01/01/18 6' (1.829 m)    Weight:   Wt Readings from Last 1 Encounters:  01/09/18 255 lb 8.2 oz (115.9 kg)    Ideal Body Weight:  80.91 kg  BMI:  Body mass index is 34.65 kg/m.  Estimated Nutritional Needs:   Kcal:  1610-96041975-2205 (17-19 kcal/kg)  Protein:  115-128 grams (1-1.1 grams/kg)  Fluid:  >/= 2 L/day      Trenton GammonJessica Annely Sliva, MS, RD, LDN, [facility] Inpatient Clinical Dietitian Pager # [phone] After hours/weekend pager # [phone]

## 2018-01-09 NOTE — Progress Notes (Signed)
eLink Physician Progress Note and Electrolyte Replacement  Patient Name: Cory MoralesRandall M Dinius Jr. DOB: 22-Jul-1956 MRN: [id number]  Date of Service  01/09/2018   HPI/Events of Note   Recent Labs  Lab 01/02/18 1255 01/02/18 1716  01/03/18 0523 01/03/18 1655 01/04/18 0332 01/05/18 0311 01/06/18 0329 01/09/18 0504  NA  --   --   --  142  --  146* 149* 148* 140  K  --   --    < > 4.0  --  3.4* 3.4* 3.5 2.9*  CL  --   --   --  109  --  108 112* 110 105  CO2  --   --   --  22  --  27 27 27 23   GLUCOSE  --   --   --  169*  --  183* 142* 159* 180*  BUN  --   --   --  15  --  20 18 16 11   CREATININE  --   --   --  0.94  --  1.00 0.83 0.83 1.02  CALCIUM  --   --   --  8.1*  --  8.3* 8.4* 8.6* 8.4*  MG 1.2* 1.2*  --  1.2* 1.9  --   --   --   --   PHOS 2.9 1.9*  --  1.6* 2.0*  --   --   --   --    < > = values in this interval not displayed.    Estimated Creatinine Clearance: 101.2 mL/min (by C-G formula based on SCr of 1.02 mg/dL).  Intake/Output      04/14 0701 - 04/15 0700   P.O. 240   I.V. (mL/kg) 636.5 (5.5)   Total Intake(mL/kg) 876.5 (7.6)   Urine (mL/kg/hr) 700 (0.3)   Total Output 700   Net +176.5        - I/O DETAILED x 24h    Total I/O In: 519.4 [I.V.:519.4] Out: 500 [Urine:500] - I/O THIS SHIFT    ASSESSMENT Low K - severe. EKG monitor looks ok  At risk for low mag and phos  eICURN Interventions  Add mag and phos to earlier collection reolete K - all IV - 10meq x 6 runs   ASSESSMENT: MAJOR ELECTROLYTE      Dr. Kalman ShanMurali Korrina Zern, M.D., [facility]C.P Pulmonary and Critical Care Medicine Staff Physician [facility] [place] [facility] Pager: [phone], If no answer or between  15:00h - 7:00h: call [phone]  01/09/2018 6:49 AM

## 2018-01-10 ENCOUNTER — Inpatient Hospital Stay (HOSPITAL_COMMUNITY): Payer: Medicare Other

## 2018-01-10 LAB — GLUCOSE, CAPILLARY
GLUCOSE-CAPILLARY: 118 mg/dL — AB (ref 65–99)
Glucose-Capillary: 147 mg/dL — ABNORMAL HIGH (ref 65–99)
Glucose-Capillary: 140 mg/dL — ABNORMAL HIGH (ref 65–99)
Glucose-Capillary: 117 mg/dL — ABNORMAL HIGH (ref 65–99)
Glucose-Capillary: 118 mg/dL — ABNORMAL HIGH (ref 65–99)
Glucose-Capillary: 118 mg/dL — ABNORMAL HIGH (ref 65–99)

## 2018-01-10 MED ORDER — ZIPRASIDONE HCL 40 MG PO CAPS
40.0000 mg | ORAL_CAPSULE | Freq: Two times a day (BID) | ORAL | Status: DC
  Administered 2018-01-10 – 2018-01-13 (×5): 40 mg via ORAL
  Filled 2018-01-10 (×7): qty 1

## 2018-01-10 MED ORDER — NICOTINE 21 MG/24HR TD PT24
21.0000 mg | MEDICATED_PATCH | Freq: Every day | TRANSDERMAL | Status: DC
  Administered 2018-01-10 – 2018-01-16 (×4): 21 mg via TRANSDERMAL
  Filled 2018-01-10 (×7): qty 1

## 2018-01-10 MED ORDER — IBUPROFEN 200 MG PO TABS
200.0000 mg | ORAL_TABLET | Freq: Four times a day (QID) | ORAL | Status: DC | PRN
Start: 2018-01-10 — End: 2018-01-16
  Administered 2018-01-11 – 2018-01-16 (×12): 200 mg via ORAL
  Filled 2018-01-10 (×12): qty 1

## 2018-01-10 MED ORDER — SERTRALINE HCL 100 MG PO TABS
100.0000 mg | ORAL_TABLET | Freq: Every day | ORAL | Status: DC
  Administered 2018-01-10 – 2018-01-16 (×7): 100 mg via ORAL
  Filled 2018-01-10 (×8): qty 1

## 2018-01-10 NOTE — Progress Notes (Addendum)
PULMONARY / CRITICAL CARE MEDICINE   Name: Cory Foster. MRN: [id number] DOB: 08-Sep-1956    ADMISSION DATE:  01/01/2018 CONSULTATION DATE:  01/01/2018 REFERRING MD:  Fayrene Fearing, ED.  CHIEF COMPLAINT:  Found down.  HISTORY OF PRESENT ILLNESS:   62 y/o male with a history of EtOH abuse and polystubstance abuse was admitted on 4/7 after he was found unresponsive at a hotel. Intubated in the ER.  Aspiration PNA and prolonged agitated delirium. Extubated 4/10.  Continues to have delirium with significant sundowning.    SUBJECTIVE:  RN reports the patient has ongoing confusion, concerned about pt's left foot > has complained of intermittent pain.  Off precedex. Patient asking to go place his bets, smoke a cigarette and get his wallet.  Thinks the window is the betting boards.   VITAL SIGNS: BP 131/83 (BP Location: Left Arm)   Pulse 66   Temp 98.2 F (36.8 C) (Oral)   Resp (!) 26   Ht 6' (1.829 m)   Wt 257 lb 15 oz (117 kg)   SpO2 90%   BMI 34.98 kg/m   HEMODYNAMICS:      VENTILATOR SETTINGS:      INTAKE / OUTPUT: I/O last 3 completed shifts: In: 2531 [P.O.:600; I.V.:1231; IV Piggyback:700] Out: 1350 [Urine:1350]  PHYSICAL EXAMINATION: General: chronically ill appearing male, unkempt appearance   HEENT: MM pink/moist Neuro: Awake, alert, not oriented, speech clear, pt thinks he is at his home at times, MAE CV: s1s2 rrr, no m/r/g PULM: even/non-labored, lungs bilaterally clear  WU:JWJX, non-tender, bsx4 active  Extremities: warm/dry, no edema, tenderness to palpation of left foot Skin: no rashes or lesions, scattered bruising   LABS:  BMET Recent Labs  Lab 01/06/18 0329 01/09/18 0504 01/09/18 1533  NA 148* 140 142  K 3.5 2.9* 3.6  CL 110 105 105  CO2 27 23 25   BUN 16 11 13   CREATININE 0.83 1.02 0.89  GLUCOSE 159* 180* 187*    Electrolytes Recent Labs  Lab 01/03/18 1655  01/06/18 0329 01/09/18 0504 01/09/18 1533  CALCIUM  --    < > 8.6* 8.4* 8.6*  MG  1.9  --   --  1.4*  --   PHOS 2.0*  --   --  3.3  --    < > = values in this interval not displayed.    CBC Recent Labs  Lab 01/04/18 0332 01/06/18 0329 01/09/18 0504  WBC 6.6 4.1 5.5  HGB 12.0* 11.3* 11.6*  HCT 36.5* 34.9* 34.9*  PLT 97* 92* 120*    Coag's No results for input(s): APTT, INR in the last 168 hours.  Sepsis Markers No results for input(s): LATICACIDVEN, PROCALCITON, O2SATVEN in the last 168 hours.  ABG Recent Labs  Lab 01/07/18 1148  PHART 7.429  PCO2ART 45.7  PO2ART 68.5*    Liver Enzymes No results for input(s): AST, ALT, ALKPHOS, BILITOT, ALBUMIN in the last 168 hours.  Cardiac Enzymes No results for input(s): TROPONINI, PROBNP in the last 168 hours.  Glucose Recent Labs  Lab 01/09/18 1119 01/09/18 1548 01/09/18 1935 01/09/18 2316 01/10/18 0313 01/10/18 0826  GLUCAP 131* 188* 124* 153* 147* 140*    Imaging No results found.   CULTURES: UA 4/8 >> negative Blood cultures 4/8 >> negative  ANTIBIOTICS: Zosyn 4/7 >> 4/9 Unasyn 4/9 >> 4/13  SIGNIFICANT EVENTS: 4/07 Admit, found down in hotel room, EtOH, intubated for hypercapnea & airway protection  4/09 Weaning precedex off, added back home klonopin  4/13  Ongoing issues with evening delirium / agitation  4/16  Off precedex, ongoing agitated delirium   LINES/TUBES: ETT 4/7 >> 4/10 Foley catheter 4/7 >>  NG tube 4/7 >> 4/10   DISCUSSION: Ongoing delirium in the setting of alcohol abuse.  He is now out of the period of withdrawal.  He now has significant sleep-wake reversal with agitated delirium (sundowning).  We will attempt to restore his normal sleep-wake cycle anticipatory use of ziprasidone and dexmedetomidine in the late afternoon.  Precedex discontinued afternoon 4/16.    ASSESSMENT / PLAN:  PULMONARY A: Acute hypoxic respiratory failure - resovled P:   O2 as needed to support sats >90% Smoking cessation counseling when Cory  Nicotine patch  Mobilize -  OOB  CARDIOVASCULAR A:  Hx Hypertension, HLD P:  SDU monitoring   RENAL A:   No acute issues  P:   Monitor I/O's  Trend BMP  GASTROINTESTINAL A:   Obesity  P:   Diet as tolerated  Aspiration precautions  Colace BID   HEMATOLOGIC A:   Anemia of chronic disease.   Macrocytosis - suspect in setting of ETOH Mild thrombocytopenia - likely related to alcohol use. P:  Trend CBC  Heparin SQ for DVT prophylaxis   INFECTIOUS A:   CAP - resolved P:   Monitor fever curve / WBC trend   ENDOCRINE A:   Hyperglycemia P:   SSI  NEUROLOGIC A:   Agitated delirium with sundowning.  Slowly resolving. May take several days to restore normal sleep wake cycle.  P:   Promote sleep / wake cycle  Lights on during day / off at night.  Minimize sleep interruptions  Increase Geodon to 40 mg PO BID  Resume zoloft  Continue klonopin  PT efforts  ORTHO  A: Left Foot Pain  P: Assess plain films of left foot   FAMILY  - Updates: No family at bedside 4/16.  Attempted to update patient on plan of care.   Canary BrimBrandi Perian Tedder, NP-C [place] [facility] Pgr: [phone] or if no answer [phone] 01/10/2018, 2:32 PM

## 2018-01-10 NOTE — Evaluation (Signed)
Physical Therapy Evaluation Patient Details Name: Cory Foster. MRN: [id number] DOB: Apr 22, 1956 Today's Date: 01/10/2018   History of Present Illness  62 yo male admitted with acute respiratory failure, aspiration pna, delirium. Extubated 4/10  Clinical Impression  On eval, pt required Mod assist +2 for mobility. Pt is currently unable to stand/ambulate due to severe bil LE pain, L worse than R. He sat EOB for ~5 minutes with Min guard assist. He was able to perform lateral scoots toward [facility] with assistance. He was mildly agitated during session. At this time, recommendation is for SNF. Will follow and progress activity as able.     Follow Up Recommendations SNF (depending on progress and if pt will agree)    Equipment Recommendations  Rolling walker with 5" wheels    Recommendations for Other Services       Precautions / Restrictions Precautions Precautions: Fall Precaution Comments: agitation Restrictions Weight Bearing Restrictions: No      Mobility  Bed Mobility Overal bed mobility: Needs Assistance Bed Mobility: Supine to Sit;Sit to Supine     Supine to sit: Mod assist;+2 for physical assistance;+2 for safety/equipment Sit to supine: Mod assist;+2 for physical assistance;+2 for safety/equipment   General bed mobility comments: Assist for trunk and bil LEs. Increased time. Pt sat EOB for ~5 mintues with Min guard assist.   Transfers Overall transfer level: Needs assistance   Transfers: Lateral/Scoot Transfers          Lateral/Scoot Transfers: Mod assist General transfer comment: Pt unable to attempt standing on today due to pain  Ambulation/Gait                Stairs            Wheelchair Mobility    Modified Rankin (Stroke Patients Only)       Balance                                             Pertinent Vitals/Pain Pain Assessment: Faces Faces Pain Scale: Hurts worst Pain Location: both LEs. L worse than  R Pain Descriptors / Indicators: Tender;Grimacing;Guarding Pain Intervention(s): Limited activity within patient's tolerance    Home Living Family/patient expects to be discharged to:: Unsure Living Arrangements: Alone             Home Equipment: None      Prior Function Level of Independence: Independent               Hand Dominance        Extremity/Trunk Assessment   Upper Extremity Assessment Upper Extremity Assessment: Overall WFL for tasks assessed    Lower Extremity Assessment Lower Extremity Assessment: Generalized weakness;LLE deficits/detail;RLE deficits/detail RLE Deficits / Details: swelling foot/ankle RLE: Unable to fully assess due to pain LLE Deficits / Details: swelling foot/ankle LLE: Unable to fully assess due to pain    Cervical / Trunk Assessment Cervical / Trunk Assessment: Normal  Communication   Communication: No difficulties  Cognition Arousal/Alertness: Awake/alert Behavior During Therapy: WFL for tasks assessed/performed Overall Cognitive Status: No family/caregiver present to determine baseline cognitive functioning Area of Impairment: Safety/judgement                         Safety/Judgement: Decreased awareness of safety            General Comments  Exercises     Assessment/Plan    PT Assessment Patient needs continued PT services  PT Problem List Decreased mobility;Decreased strength;Decreased balance;Decreased cognition;Pain;Decreased activity tolerance;Decreased knowledge of use of DME       PT Treatment Interventions DME instruction;Gait training;Functional mobility training;Therapeutic activities;Balance training;Patient/family education;Therapeutic exercise    PT Goals (Current goals can be found in the Care Plan section)  Acute Rehab PT Goals Patient Stated Goal: less pain PT Goal Formulation: With patient Time For Goal Achievement: 01/17/18 Potential to Achieve Goals: Good    Frequency  Min 3X/week   Barriers to discharge        Co-evaluation               AM-PAC PT "6 Clicks" Daily Activity  Outcome Measure Difficulty turning over in bed (including adjusting bedclothes, sheets and blankets)?: A Lot Difficulty moving from lying on back to sitting on the side of the bed? : Unable Difficulty sitting down on and standing up from a chair with arms (e.g., wheelchair, bedside commode, etc,.)?: Unable Help needed moving to and from a bed to chair (including a wheelchair)?: Total Help needed walking in hospital room?: Total Help needed climbing 3-5 steps with a railing? : Total 6 Click Score: 7    End of Session   Activity Tolerance: Patient limited by pain Patient left: in bed;with call bell/phone within reach;with bed alarm set;with restraints reapplied   PT Visit Diagnosis: Pain;Muscle weakness (generalized) (M62.81);Difficulty in walking, not elsewhere classified (R26.2);Other abnormalities of gait and mobility (R26.89);Unsteadiness on feet (R26.81) Pain - Right/Left: (R and L) Pain - part of body: Leg;Ankle and joints of foot    Time: [id number] PT Time Calculation (min) (ACUTE ONLY): 22 min   Charges:   PT Evaluation $PT Eval Moderate Complexity: 1 Mod     PT G Codes:         Rebeca AlertJannie Milano Rosevear, MPT Pager: [phone]

## 2018-01-10 NOTE — Plan of Care (Signed)
Patient slept well and has been calm through night.  Will turn off sedation and remove restrains in AM to evaluate for current mentation as requested by Md.

## 2018-01-10 NOTE — Progress Notes (Signed)
Patient has been off Precedex drip since this morning. Has periods of calm with intermittent periods of agitation.  When patient was trying to get out of bed , RN noticed his left foot turn purple on dangling. The patient complained of left foot pain on trying to elevate it. Left foot looks  slightly more swollen than the right. Will notify MD.

## 2018-01-10 NOTE — Progress Notes (Signed)
eLink Physician-Brief Progress Note Patient Name: Cory MoralesRandall M Chrostowski Jr. DOB: 08/21/1956 MRN: [id number]   Date of Service  01/10/2018  HPI/Events of Note  Leg pain  eICU Interventions  Ibuprofen PRN     Intervention Category Minor Interventions: Other:  Gillian Kluever 01/10/2018, 11:41 PM

## 2018-01-11 LAB — CBC WITH DIFFERENTIAL/PLATELET
BASOS ABS: 0 10*3/uL (ref 0.0–0.1)
Basophils Relative: 0 %
Eosinophils Absolute: 0.3 10*3/uL (ref 0.0–0.7)
Eosinophils Relative: 5 %
HEMATOCRIT: 32.6 % — AB (ref 39.0–52.0)
Hemoglobin: 10.9 g/dL — ABNORMAL LOW (ref 13.0–17.0)
LYMPHS ABS: 1.6 10*3/uL (ref 0.7–4.0)
LYMPHS PCT: 27 %
MCH: 36.6 pg — ABNORMAL HIGH (ref 26.0–34.0)
MCHC: 33.4 g/dL (ref 30.0–36.0)
MCV: 109.4 fL — AB (ref 78.0–100.0)
MONO ABS: 0.7 10*3/uL (ref 0.1–1.0)
MONOS PCT: 12 %
NEUTROS ABS: 3.3 10*3/uL (ref 1.7–7.7)
Neutrophils Relative %: 56 %
Platelets: 168 10*3/uL (ref 150–400)
RBC: 2.98 MIL/uL — ABNORMAL LOW (ref 4.22–5.81)
RDW: 17.9 % — AB (ref 11.5–15.5)
WBC: 5.8 10*3/uL (ref 4.0–10.5)

## 2018-01-11 LAB — BASIC METABOLIC PANEL
ANION GAP: 9 (ref 5–15)
BUN: 15 mg/dL (ref 6–20)
CALCIUM: 8.6 mg/dL — AB (ref 8.9–10.3)
CO2: 23 mmol/L (ref 22–32)
Chloride: 110 mmol/L (ref 101–111)
Creatinine, Ser: 0.99 mg/dL (ref 0.61–1.24)
GFR calc Af Amer: >60 mL/min (ref >60)
GFR calc non Af Amer: >60 mL/min (ref >60)
GLUCOSE: 154 mg/dL — AB (ref 65–99)
Potassium: 4 mmol/L (ref 3.5–5.1)
Sodium: 142 mmol/L (ref 135–145)

## 2018-01-11 LAB — GLUCOSE, CAPILLARY
GLUCOSE-CAPILLARY: 119 mg/dL — AB (ref 65–99)
GLUCOSE-CAPILLARY: 126 mg/dL — AB (ref 65–99)
GLUCOSE-CAPILLARY: 133 mg/dL — AB (ref 65–99)
GLUCOSE-CAPILLARY: 137 mg/dL — AB (ref 65–99)
Glucose-Capillary: 133 mg/dL — ABNORMAL HIGH (ref 65–99)
Glucose-Capillary: 125 mg/dL — ABNORMAL HIGH (ref 65–99)

## 2018-01-11 LAB — MAGNESIUM: Magnesium: 1.7 mg/dL (ref 1.7–2.4)

## 2018-01-11 LAB — PHOSPHORUS: Phosphorus: 4.4 mg/dL (ref 2.5–4.6)

## 2018-01-12 LAB — GLUCOSE, CAPILLARY
GLUCOSE-CAPILLARY: 116 mg/dL — AB (ref 65–99)
GLUCOSE-CAPILLARY: 111 mg/dL — AB (ref 65–99)
Glucose-Capillary: 127 mg/dL — ABNORMAL HIGH (ref 65–99)
Glucose-Capillary: 135 mg/dL — ABNORMAL HIGH (ref 65–99)
Glucose-Capillary: 117 mg/dL — ABNORMAL HIGH (ref 65–99)

## 2018-01-12 MED ORDER — LORAZEPAM 2 MG/ML IJ SOLN
INTRAMUSCULAR | Status: AC
  Administered 2018-01-12: 2 mg
  Filled 2018-01-12: qty 1

## 2018-01-12 MED ORDER — LORAZEPAM 2 MG/ML IJ SOLN
2.0000 mg | Freq: Once | INTRAMUSCULAR | Status: AC
  Administered 2018-01-12: 2 mg via INTRAVENOUS

## 2018-01-12 NOTE — Progress Notes (Signed)
Pt suddenly agitated trying to get OOB, not able to calm pt down, security called , MD notified ativan 2 mg given also c/o of nausea, Zofran given.  Placed back to bed and placed in soft wrist restraints

## 2018-01-12 NOTE — Progress Notes (Signed)
PULMONARY / CRITICAL CARE MEDICINE   Name: Cory Foster. MRN: [id number] DOB: 1956-03-16    ADMISSION DATE:  01/01/2018 CONSULTATION DATE:  01/01/2018 REFERRING MD:  Fayrene Fearing, ED.  CHIEF COMPLAINT:  Found down.  HISTORY OF PRESENT ILLNESS:   62 y/o male with a history of EtOH abuse and polystubstance abuse was admitted on 4/7 after he was found unresponsive at a hotel. Intubated in the ER.  Aspiration PNA and prolonged agitated delirium. Extubated 4/10.  Continues to have delirium with significant sundowning.    SUBJECTIVE:  Staff report patient has been off precedex x48 hours.  Remains calm but not oriented. Remains on geodon, klonopin.    VITAL SIGNS: BP (!) 159/95   Pulse 83   Temp 98.6 F (37 C) (Oral)   Resp 18   Ht 6' (1.829 m)   Wt 259 lb 0.7 oz (117.5 kg)   SpO2 90%   BMI 35.13 kg/m   HEMODYNAMICS:      VENTILATOR SETTINGS:      INTAKE / OUTPUT: I/O last 3 completed shifts: In: 960 [P.O.:240; I.V.:720] Out: 1900 [Urine:1900]  PHYSICAL EXAMINATION: General: disheveled adult male in NAD HEENT: MM pink/moist, poor dentition, dry plaques on face with flaking of skin PSY: calm but not oriented Neuro: Awake, states he is in [place], needs to have his eyes checked CV: s1s2 rrr, no m/r/g PULM: even/non-labored, lungs bilaterally diminished but clear  WU:JWJX, non-tender, bsx4 active  Extremities: warm/dry, trace LE edema  Skin: no rashes or lesions  LABS:  BMET Recent Labs  Lab 01/09/18 0504 01/09/18 1533 01/11/18 0437  NA 140 142 142  K 2.9* 3.6 4.0  CL 105 105 110  CO2 BUN CREATININE 1.02 0.89 0.99  GLUCOSE 180* 187* 154*    Electrolytes Recent Labs  Lab 01/09/18 0504 01/09/18 1533 01/11/18 0437  CALCIUM 8.4* 8.6* 8.6*  MG 1.4*  --  1.7  PHOS 3.3  --  4.4    CBC Recent Labs  Lab 01/06/18 0329 01/09/18 0504 01/11/18 0437  WBC 4.1 5.5 5.8  HGB 11.3* 11.6* 10.9*  HCT 34.9* 34.9* 32.6*  PLT 92* 120* 168     Coag's No results for input(s): APTT, INR in the last 168 hours.  Sepsis Markers No results for input(s): LATICACIDVEN, PROCALCITON, O2SATVEN in the last 168 hours.  ABG Recent Labs  Lab 01/07/18 1148  PHART 7.429  PCO2ART 45.7  PO2ART 68.5*    Liver Enzymes No results for input(s): AST, ALT, ALKPHOS, BILITOT, ALBUMIN in the last 168 hours.  Cardiac Enzymes No results for input(s): TROPONINI, PROBNP in the last 168 hours.  Glucose Recent Labs  Lab 01/11/18 1223 01/11/18 1651 01/11/18 1921 01/11/18 2330 01/12/18 0309 01/12/18 0727  GLUCAP 133* 133* 125* 119* 127* 116*    Imaging No results found.   CULTURES: UA 4/8 >> negative Blood cultures 4/8 >> negative  ANTIBIOTICS: Zosyn 4/7 >> 4/9 Unasyn 4/9 >> 4/13  SIGNIFICANT EVENTS: 4/07 Admit, found down in hotel room, EtOH, intubated for hypercapnea & airway protection  4/09 Weaning precedex off, added back home klonopin  4/13  Ongoing issues with evening delirium / agitation  4/16  Off precedex, ongoing agitated delirium   LINES/TUBES: ETT 4/7 >> 4/10 Foley catheter 4/7 >>  NG tube 4/7 >> 4/10  STUDIES: L Foot XR 4/16 >> generalized soft tissue swelling, no acute fracture or dislocation  DISCUSSION: Ongoing delirium in the setting of alcohol abuse.  He is now out of the period of withdrawal.  He has suffered from significant sleep-wake reversal with agitated delirium (sundowning).  He was on/off precedex until 4/16 but has remained off since.    ASSESSMENT / PLAN:  PULMONARY A: Acute hypoxic respiratory failure - resovled P:   Wean O2 for sats >88% Smoking cessation counseling when able  Nicotine patch  Mobilize - OOB  CARDIOVASCULAR A:  Hx Hypertension, HLD P:  SDU monitoring   RENAL A:   No acute issues  P:   Monitor I/O's  Trend BMP   GASTROINTESTINAL A:   Obesity  P:   Diet as tolerated  Aspiration precautions  Colace BID   HEMATOLOGIC A:   Anemia of chronic  disease.   Macrocytosis - suspect in setting of ETOH Mild thrombocytopenia - likely related to alcohol use. P:  Trend CBC  Heparin SQ for DVT prophylaxis   INFECTIOUS A:   CAP - resolved P:   Monitor off abx Trend WBC / fever curve  ENDOCRINE A:   Hyperglycemia P:   SSI   NEUROLOGIC A:   Agitated delirium with sundowning.  Slowly resolving. May take several days to restore normal sleep wake cycle.  P:   Promote sleep / wake cycle  Lights on during day / off at night > let him sleep if possible  Minimize sleep interruptions  Consider reduction of Geodon 4/19 with taper to off  Continue zoloft, klonopin  PT efforts as able   ORTHO  A: Left Foot Pain - negative XRAY P: Supportive care  FAMILY  - Updates: No family at bedside 4/18.    Transfer to [facility] 4/19.  SDU status.    Canary Brim, NP-C [facility] Pgr: [phone] or if no answer [phone] 01/12/2018, 11:19 AM

## 2018-01-12 NOTE — Progress Notes (Signed)
Physical Therapy Treatment Patient Details Name: Cory MoralesRandall M Mackel Jr. MRN: [id number] DOB: 10/26/1955 Today's Date: 01/12/2018    History of Present Illness 62 yo male admitted with acute respiratory failure, aspiration pna, delirium. Extubated 4/10, recently in       [facility] for ETOH, SI.    PT Comments    Patient found in 4 point restraints. Tolerated  Loosening to participate in therapy, at times, slight increase in agitation. Worked on bed mobility and sitting in bed, not safe to attempt OOB at this time. Continue PT.   Follow Up Recommendations  SNF     Equipment Recommendations  Rolling walker with 5" wheels    Recommendations for Other Services       Precautions / Restrictions Precautions Precautions: Fall Precaution Comments: agitation    Mobility  Bed Mobility Overal bed mobility: Needs Assistance Bed Mobility: Supine to Sit           General bed mobility comments: patient;s bed placed in chair position. patient able to lean forward and pull up socks. Self scooted buttocks back into bed.  Used UE's on rails to move self around. Did not attempt to sit at bed edge due to patient's MS and some increased agitation.  Transfers                    Ambulation/Gait                 Stairs             Wheelchair Mobility    Modified Rankin (Stroke Patients Only)       Balance Overall balance assessment: Needs assistance Sitting-balance support: No upper extremity supported;Feet supported Sitting balance-Leahy Scale: Fair                                      Cognition Arousal/Alertness: Awake/alert Behavior During Therapy: Agitated Overall Cognitive Status: No family/caregiver present to determine baseline cognitive functioning Area of Impairment: Orientation;Safety/judgement;Awareness                 Orientation Level: Place;Time;Situation       Safety/Judgement: Decreased awareness of safety     General  Comments: at times became more agitated, in all 4 restraints upon therapist entering. left with leg restraints and RN in room. Hands free of restraints      Exercises General Exercises - Upper Extremity Shoulder Flexion: AROM;Both;5 reps;Seated General Exercises - Lower Extremity Long Arc Quad: AROM;Both;10 reps;Seated    General Comments        Pertinent Vitals/Pain Faces Pain Scale: Hurts whole lot Pain Location: left knee Pain Descriptors / Indicators: Tender;Grimacing;Guarding Pain Intervention(s): Monitored during session    Home Living                      Prior Function            PT Goals (current goals can now be found in the care plan section) Progress towards PT goals: Progressing toward goals    Frequency    Min 2X/week      PT Plan Current plan remains appropriate;Frequency needs to be updated    Co-evaluation              AM-PAC PT "6 Clicks" Daily Activity  Outcome Measure  Difficulty turning over in bed (including adjusting bedclothes, sheets and blankets)?: A Lot Difficulty moving  from lying on back to sitting on the side of the bed? : Unable Difficulty sitting down on and standing up from a chair with arms (e.g., wheelchair, bedside commode, etc,.)?: Unable Help needed moving to and from a bed to chair (including a wheelchair)?: Total Help needed walking in hospital room?: Total Help needed climbing 3-5 steps with a railing? : Total 6 Click Score: 7    End of Session   Activity Tolerance: Patient tolerated treatment well Patient left: in bed;with call bell/phone within reach Nurse Communication: Mobility status;Need for lift equipment PT Visit Diagnosis: Pain;Muscle weakness (generalized) (M62.81);Difficulty in walking, not elsewhere classified (R26.2);Other abnormalities of gait and mobility (R26.89);Unsteadiness on feet (R26.81) Pain - Right/Left: Left Pain - part of body: Leg;Ankle and joints of foot;Knee     Time:  1610-9604 PT Time Calculation (min) (ACUTE ONLY): 35 min  Charges:  $Therapeutic Activity: 23-37 mins                    G CodesBlanchard Foster PT [phone]    Cory Foster 01/12/2018, 9:44 AM

## 2018-01-13 ENCOUNTER — Other Ambulatory Visit: Payer: Self-pay

## 2018-01-13 LAB — CBC WITH DIFFERENTIAL/PLATELET
BASOS ABS: 0 10*3/uL (ref 0.0–0.1)
BASOS PCT: 0 %
EOS ABS: 0.3 10*3/uL (ref 0.0–0.7)
Eosinophils Relative: 4 %
HCT: 32.8 % — ABNORMAL LOW (ref 39.0–52.0)
HEMOGLOBIN: 10.9 g/dL — AB (ref 13.0–17.0)
Lymphocytes Relative: 23 %
Lymphs Abs: 1.6 10*3/uL (ref 0.7–4.0)
MCH: 36 pg — ABNORMAL HIGH (ref 26.0–34.0)
MCHC: 33.2 g/dL (ref 30.0–36.0)
MCV: 108.3 fL — ABNORMAL HIGH (ref 78.0–100.0)
MONOS PCT: 10 %
Monocytes Absolute: 0.7 10*3/uL (ref 0.1–1.0)
NEUTROS PCT: 63 %
Neutro Abs: 4.3 10*3/uL (ref 1.7–7.7)
Platelets: 212 10*3/uL (ref 150–400)
RBC: 3.03 MIL/uL — ABNORMAL LOW (ref 4.22–5.81)
RDW: 17 % — ABNORMAL HIGH (ref 11.5–15.5)
WBC: 6.8 10*3/uL (ref 4.0–10.5)

## 2018-01-13 LAB — BASIC METABOLIC PANEL
ANION GAP: 9 (ref 5–15)
BUN: 15 mg/dL (ref 6–20)
CALCIUM: 8.7 mg/dL — AB (ref 8.9–10.3)
CO2: 26 mmol/L (ref 22–32)
Chloride: 104 mmol/L (ref 101–111)
Creatinine, Ser: 0.92 mg/dL (ref 0.61–1.24)
GFR calc non Af Amer: >60 mL/min (ref >60)
Glucose, Bld: 122 mg/dL — ABNORMAL HIGH (ref 65–99)
Potassium: 3.8 mmol/L (ref 3.5–5.1)
SODIUM: 139 mmol/L (ref 135–145)

## 2018-01-13 LAB — PHOSPHORUS: PHOSPHORUS: 4.6 mg/dL (ref 2.5–4.6)

## 2018-01-13 LAB — MAGNESIUM: MAGNESIUM: 1.9 mg/dL (ref 1.7–2.4)

## 2018-01-13 LAB — GLUCOSE, CAPILLARY
GLUCOSE-CAPILLARY: 139 mg/dL — AB (ref 65–99)
GLUCOSE-CAPILLARY: 135 mg/dL — AB (ref 65–99)
GLUCOSE-CAPILLARY: 115 mg/dL — AB (ref 65–99)
GLUCOSE-CAPILLARY: 115 mg/dL — AB (ref 65–99)
Glucose-Capillary: 117 mg/dL — ABNORMAL HIGH (ref 65–99)
Glucose-Capillary: 122 mg/dL — ABNORMAL HIGH (ref 65–99)

## 2018-01-13 MED ORDER — VITAMIN B-1 100 MG PO TABS
100.0000 mg | ORAL_TABLET | Freq: Every day | ORAL | Status: DC
  Administered 2018-01-13 – 2018-01-16 (×4): 100 mg via ORAL
  Filled 2018-01-13 (×4): qty 1

## 2018-01-13 MED ORDER — FOLIC ACID 1 MG PO TABS
1.0000 mg | ORAL_TABLET | Freq: Every day | ORAL | Status: DC
  Administered 2018-01-13 – 2018-01-16 (×4): 1 mg via ORAL
  Filled 2018-01-13 (×4): qty 1

## 2018-01-13 MED ORDER — ZIPRASIDONE HCL 40 MG PO CAPS
40.0000 mg | ORAL_CAPSULE | Freq: Every day | ORAL | Status: DC
  Administered 2018-01-14: 40 mg via ORAL
  Filled 2018-01-13: qty 1

## 2018-01-13 MED ORDER — CYCLOBENZAPRINE HCL 5 MG PO TABS
5.0000 mg | ORAL_TABLET | Freq: Three times a day (TID) | ORAL | Status: DC | PRN
  Administered 2018-01-13 – 2018-01-16 (×9): 5 mg via ORAL
  Filled 2018-01-13 (×9): qty 1

## 2018-01-13 MED ORDER — ATORVASTATIN CALCIUM 40 MG PO TABS
40.0000 mg | ORAL_TABLET | Freq: Every day | ORAL | Status: DC
  Administered 2018-01-13 – 2018-01-16 (×4): 40 mg via ORAL
  Filled 2018-01-13 (×4): qty 1

## 2018-01-13 NOTE — Progress Notes (Signed)
PROGRESS NOTE    Cory Foster.  ZOX:[id number] DOB: 1956/08/16 DOA: 01/01/2018 PCP: [facility]   Brief Narrative:  Patient is a 62 year old male with past medical history of chronic alcohol abuse was admitted on 4/7 after he was found to be unresponsive at a hotel.  He was intubated in the emergency department.  He had a prolonged hospital course due to aspiration pneumonia and agitation, delirium.  He was extubated on 4/10 Patient was transferred to hospital service on 01/13/18. Currently has been managed for delirium, agitation and sundowning.    Assessment & Plan:   Active Problems:   Severe recurrent major depression without psychotic features (HCC)   Acute respiratory failure with hypercapnia (HCC)  Acute respiratory failure with hypoxia: Status post extubation. Currently saturating fine on nasal cannula. We will try to mobilize. Acute respiratory failure with hypoxia most likely secondary to aspiration pneumonia on presentation.  He finished antibiotics course.  History of DM:  continue sliding scale insulin. On metformin at home  Hyperlipidemia: Continue statin  Anemia: Most likely chronic.  Might be associated with alcohol and nutritional deficiency.  Macrocytosis.  Mild thrombo-cytopenia.  Continue thiamine and folic acid.  Agitation/delirium: With  associated sundowning.  Promote sleep/wake cycle.  Minimize sleep interruptions.  If possible we will move him to a bright room with sun light.  Continue Zoloft, Klonopin. Patient continues to be agitated today.  Had to be given Ativan.  Also started on Geodon by PCCM. We will continue to taper. No signs of alcohol withdrawal.  This could be due permanent confusional state associated with alcohol dementia versus Korsakoff syndrome.  Left foot pain: Continue supportive care.Xray negative.   DVT prophylaxis: Hep [place] Code Status: Full Family Communication: None present at the bed side Disposition Plan:  Needs to be determined   Consultants: PCCM  Procedures: Intubation on 4/7  Antimicrobials: None  Subjective: Patient seen and examined the bedside this morning.  Not agitated during my evaluation.  Comfortable. Reported to be agitated during the day.  Objective: Vitals:   01/13/18 1040 01/13/18 1042 01/13/18 1200 01/13/18 1544  BP:   128/80   Pulse:   68   Resp:   18   Temp:   98 F (36.7 C) 98 F (36.7 C)  TempSrc:   Oral Oral  SpO2: (!) 89% 90% 91%   Weight:      Height:        Intake/Output Summary (Last 24 hours) at 01/13/2018 1601 Last data filed at 01/13/2018 1400 Gross per 24 hour  Intake 240 ml  Output 1675 ml  Net -1435 ml   Filed Weights   01/09/18 0500 01/10/18 0600 01/11/18 1325  Weight: 115.9 kg (255 lb 8.2 oz) 117 kg (257 lb 15 oz) 117.5 kg (259 lb 0.7 oz)    Examination:  General exam: Appears calm and comfortable ,Not in distress,average built HEENT:PERRL,Oral mucosa moist, Ear/Nose normal on gross exam Respiratory system: Bilateral equal air entry, normal vesicular breath sounds, no wheezes or crackles  Cardiovascular system: S1 & S2 heard, RRR. No JVD, murmurs, rubs, gallops or clicks. No pedal edema. Gastrointestinal system: Abdomen is nondistended, soft and nontender. No organomegaly or masses felt. Normal bowel sounds heard. Central nervous system: Alert and oriented. No focal neurological deficits. Extremities: No edema, no clubbing ,no cyanosis, distal peripheral pulses palpable. Skin: No rashes, lesions or ulcers,no icterus ,no pallor MSK: Normal muscle bulk,tone ,power Psychiatry: Judgement and insight appear normal. Mood & affect appropriate.  Data Reviewed: I have personally reviewed following labs and imaging studies  CBC: Recent Labs  Lab 01/09/18 0504 01/11/18 0437 01/13/18 0453  WBC 5.5 5.8 6.8  NEUTROABS 3.0 3.3 4.3  HGB 11.6* 10.9* 10.9*  HCT 34.9* 32.6* 32.8*  MCV 108.7* 109.4* 108.3*  PLT 120* 168 212   Basic  Metabolic Panel: Recent Labs  Lab 01/09/18 0504 01/09/18 1533 01/11/18 0437 01/13/18 0453  NA 140 142 142 139  K 2.9* 3.6 4.0 3.8  CL 105 105 110 104  CO2 GLUCOSE 180* 187* 154* 122*  BUN CREATININE 1.02 0.89 0.99 0.92  CALCIUM 8.4* 8.6* 8.6* 8.7*  MG 1.4*  --  1.7 1.9  PHOS 3.3  --  4.4 4.6   GFR: Estimated Creatinine Clearance: 113 mL/min (by C-G formula based on SCr of 0.92 mg/dL). Liver Function Tests: No results for input(s): AST, ALT, ALKPHOS, BILITOT, PROT, ALBUMIN in the last 168 hours. No results for input(s): LIPASE, AMYLASE in the last 168 hours. No results for input(s): AMMONIA in the last 168 hours. Coagulation Profile: No results for input(s): INR, PROTIME in the last 168 hours. Cardiac Enzymes: No results for input(s): CKTOTAL, CKMB, CKMBINDEX, TROPONINI in the last 168 hours. BNP (last 3 results) No results for input(s): PROBNP in the last 8760 hours. HbA1C: No results for input(s): HGBA1C in the last 72 hours. CBG: Recent Labs  Lab 01/12/18 1908 01/13/18 0312 01/13/18 0803 01/13/18 1202 01/13/18 1531  GLUCAP 111* 139* 117* 122* 135*   Lipid Profile: No results for input(s): CHOL, HDL, LDLCALC, TRIG, CHOLHDL, LDLDIRECT in the last 72 hours. Thyroid Function Tests: No results for input(s): TSH, T4TOTAL, FREET4, T3FREE, THYROIDAB in the last 72 hours. Anemia Panel: No results for input(s): VITAMINB12, FOLATE, FERRITIN, TIBC, IRON, RETICCTPCT in the last 72 hours. Sepsis Labs: No results for input(s): PROCALCITON, LATICACIDVEN in the last 168 hours.  No results found for this or any previous visit (from the past 240 hour(s)).       Radiology Studies: No results found.      Scheduled Meds: . clonazepam  2 mg Oral Daily  . docusate sodium  100 mg Oral BID  . heparin  5,000 Units Subcutaneous Q8H  . insulin aspart  0-15 Units Subcutaneous Q4H  . multivitamin with minerals  1 tablet Oral Daily  . nicotine  21 mg  Transdermal Daily  . sertraline  100 mg Oral Daily  . ziprasidone  40 mg Oral BID WC   Continuous Infusions: . sodium chloride    . sodium chloride Stopped (01/12/18 1225)     LOS: 12 days    Time spent: More than 50% of that time was spent in counseling and/or coordination of care.      Burnadette Pop, MD [facility] Pager [phone]  If 7PM-7AM, please contact night-coverage [email or web address] Password [email or web address] 01/13/2018, 4:01 PM

## 2018-01-14 LAB — GLUCOSE, CAPILLARY
GLUCOSE-CAPILLARY: 132 mg/dL — AB (ref 65–99)
GLUCOSE-CAPILLARY: 116 mg/dL — AB (ref 65–99)
Glucose-Capillary: 126 mg/dL — ABNORMAL HIGH (ref 65–99)
Glucose-Capillary: 126 mg/dL — ABNORMAL HIGH (ref 65–99)
Glucose-Capillary: 147 mg/dL — ABNORMAL HIGH (ref 65–99)
Glucose-Capillary: 155 mg/dL — ABNORMAL HIGH (ref 65–99)

## 2018-01-14 LAB — LIPID PANEL
CHOL/HDL RATIO: 7.1 ratio
Cholesterol: 113 mg/dL (ref 0–200)
HDL: 16 mg/dL — ABNORMAL LOW (ref >40)
LDL Cholesterol: 69 mg/dL (ref 0–99)
Triglycerides: 141 mg/dL (ref <150)
VLDL: 28 mg/dL (ref 0–40)

## 2018-01-14 MED ORDER — ZIPRASIDONE HCL 20 MG PO CAPS
20.0000 mg | ORAL_CAPSULE | Freq: Every day | ORAL | Status: AC
  Administered 2018-01-15: 20 mg via ORAL
  Filled 2018-01-14: qty 1

## 2018-01-14 NOTE — Progress Notes (Addendum)
PROGRESS NOTE    Cory Foster.  ZOX:[id number] DOB: 27-Feb-1956 DOA: 01/01/2018 PCP: [facility]   Brief Narrative:  Patient is a 62 year old male with past medical history of chronic alcohol abuse was admitted on 4/7 after he was found to be unresponsive at a hotel.  He was intubated in the emergency department.  He had a prolonged hospital course due to aspiration pneumonia and agitation, delirium.  He was extubated on 4/10 Patient was transferred to hospital service on 01/13/18. Currently has been managed for delirium, agitation and sundowning.    Assessment & Plan:   Active Problems:   Alcohol dependence (HCC)   Hyperlipidemia   Substance abuse (HCC)   Severe recurrent major depression without psychotic features (HCC)   Acute respiratory failure with hypercapnia (HCC)  Acute respiratory failure with hypoxia: Much improved.Status post extubation. Currently saturating fine on room air.We will try to mobilize. Acute respiratory failure with hypoxia most likely secondary to aspiration pneumonia on presentation.  He finished antibiotics course.  History of DM:  continue sliding scale insulin. On metformin at home  Hyperlipidemia: Continue statin  Anemia: Most likely chronic.  Might be associated with alcohol and nutritional deficiency.  Macrocytosis.  Mild thrombocytopenia.  Continue thiamine and folic acid.  Diabetes mellitus type 2: Continue sliding-scale insulin here.  Patient is on metformin at home.  Agitation/delirium: With  associated sundowning.  Promote sleep/wake cycle.  Minimize sleep interruptions.  If possible we will move him to a bright room with sun light.  Continue Zoloft, Klonopin. Patient appears very calm today.   Also started on Geodon by PCCM. We will continue to taper. No signs of alcohol withdrawal.   He is slightly confused but oriented to place and person .This could be due permanent confusional state associated with alcohol dementia  versus Korsakoff syndrome.  History of depression: Continue Zoloft.  Mood is stable at present  Deconditioning/debility: We will request for physical therapy evaluation.   DVT prophylaxis: Hep [place] Code Status: Full Family Communication: None present at the bed side Disposition Plan: Pending PT evaluation  Consultants: PCCM  Procedures: Intubation on 4/7  Antimicrobials: None  Subjective: Patient seen and examined the bedside this morning.  He was comfortable.  Off oxygen.  Respiratory status is stable.  Does not complain of any chest pain or shortness of breath.  Mental status has significantly improved this morning.  Objective: Vitals:   01/14/18 0351 01/14/18 0452 01/14/18 0800 01/14/18 1111  BP:    (!) 107/51  Pulse:    77  Resp:    16  Temp: 98 F (36.7 C)  (!) 97.4 F (36.3 C)   TempSrc: Oral  Oral   SpO2:    92%  Weight:  111.7 kg (246 lb 4.1 oz)    Height:        Intake/Output Summary (Last 24 hours) at 01/14/2018 1127 Last data filed at 01/13/2018 1800 Gross per 24 hour  Intake 240 ml  Output 950 ml  Net -710 ml   Filed Weights   01/10/18 0600 01/11/18 1325 01/14/18 0452  Weight: 117 kg (257 lb 15 oz) 117.5 kg (259 lb 0.7 oz) 111.7 kg (246 lb 4.1 oz)    Examination:  General exam: Appears calm and comfortable ,Not in distress,average built HEENT:PERRL,Oral mucosa moist, Ear/Nose normal on gross exam Respiratory system: Bilateral equal air entry, normal vesicular breath sounds, no wheezes or crackles  Cardiovascular system: S1 & S2 heard, RRR. No JVD, murmurs, rubs,  gallops or clicks. Gastrointestinal system: Abdomen is nondistended, soft and nontender. No organomegaly or masses felt. Normal bowel sounds heard. Central nervous system: Alert and oriented to place and person. No focal neurological deficits. Extremities: No edema, no clubbing ,no cyanosis, distal peripheral pulses palpable. Skin: No rashes, lesions or ulcers,no icterus ,no pallor   Data  Reviewed: I have personally reviewed following labs and imaging studies  CBC: Recent Labs  Lab 01/09/18 0504 01/11/18 0437 01/13/18 0453  WBC 5.5 5.8 6.8  NEUTROABS 3.0 3.3 4.3  HGB 11.6* 10.9* 10.9*  HCT 34.9* 32.6* 32.8*  MCV 108.7* 109.4* 108.3*  PLT 120* 168 212   Basic Metabolic Panel: Recent Labs  Lab 01/09/18 0504 01/09/18 1533 01/11/18 0437 01/13/18 0453  NA 140 142 142 139  K 2.9* 3.6 4.0 3.8  CL 105 105 110 104  CO2 23 25 23 26   GLUCOSE 180* 187* 154* 122*  BUN 11 13 15 15   CREATININE 1.02 0.89 0.99 0.92  CALCIUM 8.4* 8.6* 8.6* 8.7*  MG 1.4*  --  1.7 1.9  PHOS 3.3  --  4.4 4.6   GFR: Estimated Creatinine Clearance: 110.1 mL/min (by C-G formula based on SCr of 0.92 mg/dL). Liver Function Tests: No results for input(s): AST, ALT, ALKPHOS, BILITOT, PROT, ALBUMIN in the last 168 hours. No results for input(s): LIPASE, AMYLASE in the last 168 hours. No results for input(s): AMMONIA in the last 168 hours. Coagulation Profile: No results for input(s): INR, PROTIME in the last 168 hours. Cardiac Enzymes: No results for input(s): CKTOTAL, CKMB, CKMBINDEX, TROPONINI in the last 168 hours. BNP (last 3 results) No results for input(s): PROBNP in the last 8760 hours. HbA1C: No results for input(s): HGBA1C in the last 72 hours. CBG: Recent Labs  Lab 01/13/18 1531 01/13/18 1930 01/13/18 2315 01/14/18 0312 01/14/18 0758  GLUCAP 135* 115* 115* 132* 116*   Lipid Profile: Recent Labs    01/14/18 0312  CHOL 113  HDL 16*  LDLCALC 69  TRIG [id number]  CHOLHDL 7.1   Thyroid Function Tests: No results for input(s): TSH, T4TOTAL, FREET4, T3FREE, THYROIDAB in the last 72 hours. Anemia Panel: No results for input(s): VITAMINB12, FOLATE, FERRITIN, TIBC, IRON, RETICCTPCT in the last 72 hours. Sepsis Labs: No results for input(s): PROCALCITON, LATICACIDVEN in the last 168 hours.  No results found for this or any previous visit (from the past 240 hour(s)).        Radiology Studies: No results found.      Scheduled Meds: . atorvastatin  40 mg Oral q1800  . clonazepam  2 mg Oral Daily  . docusate sodium  100 mg Oral BID  . folic acid  1 mg Oral Daily  . heparin  5,000 Units Subcutaneous Q8H  . insulin aspart  0-15 Units Subcutaneous Q4H  . multivitamin with minerals  1 tablet Oral Daily  . nicotine  21 mg Transdermal Daily  . sertraline  100 mg Oral Daily  . thiamine  100 mg Oral Daily  . [START ON 01/15/2018] ziprasidone  20 mg Oral Daily   Continuous Infusions: . sodium chloride       LOS: 13 days    Time spent: More than 50% of that time was spent in counseling and/or coordination of care.      Burnadette PopAmrit Alfonso Shackett, MD [facility] Pager [phone]  If 7PM-7AM, please contact night-coverage [email or web address] Password [email or web address] 01/14/2018, 11:27 AM

## 2018-01-14 NOTE — Plan of Care (Signed)
  Problem: Clinical Measurements: Goal: Ability to maintain clinical measurements within normal limits will improve Outcome: Progressing Goal: Will remain free from infection Outcome: Progressing Goal: Diagnostic test results will improve Outcome: Progressing Goal: Respiratory complications will improve Outcome: Progressing Goal: Cardiovascular complication will be avoided Outcome: Progressing   Problem: Nutrition: Goal: Adequate nutrition will be maintained Outcome: Progressing   Problem: Coping: Goal: Level of anxiety will decrease Outcome: Progressing   Problem: Pain Managment: Goal: General experience of comfort will improve Outcome: Progressing   Problem: Safety: Goal: Ability to remain free from injury will improve Outcome: Progressing   Problem: Skin Integrity: Goal: Risk for impaired skin integrity will decrease Outcome: Progressing   

## 2018-01-15 DIAGNOSIS — F10230 Alcohol dependence with withdrawal, uncomplicated: Secondary | ICD-10-CM

## 2018-01-15 LAB — GLUCOSE, CAPILLARY
GLUCOSE-CAPILLARY: 126 mg/dL — AB (ref 65–99)
Glucose-Capillary: 104 mg/dL — ABNORMAL HIGH (ref 65–99)
Glucose-Capillary: 111 mg/dL — ABNORMAL HIGH (ref 65–99)
Glucose-Capillary: 116 mg/dL — ABNORMAL HIGH (ref 65–99)
Glucose-Capillary: 132 mg/dL — ABNORMAL HIGH (ref 65–99)
Glucose-Capillary: 174 mg/dL — ABNORMAL HIGH (ref 65–99)

## 2018-01-15 NOTE — Plan of Care (Signed)
  Problem: Education: Goal: Knowledge of General Education information will improve Outcome: Progressing   Problem: Health Behavior/Discharge Planning: Goal: Ability to manage health-related needs will improve Outcome: Progressing   Problem: Clinical Measurements: Goal: Ability to maintain clinical measurements within normal limits will improve Outcome: Progressing Goal: Will remain free from infection Outcome: Progressing Goal: Diagnostic test results will improve Outcome: Progressing Goal: Respiratory complications will improve Outcome: Progressing Goal: Cardiovascular complication will be avoided Outcome: Progressing   Problem: Nutrition: Goal: Adequate nutrition will be maintained Outcome: Progressing   Problem: Coping: Goal: Level of anxiety will decrease Outcome: Progressing   Problem: Pain Managment: Goal: General experience of comfort will improve Outcome: Progressing   Problem: Safety: Goal: Ability to remain free from injury will improve Outcome: Progressing   Problem: Skin Integrity: Goal: Risk for impaired skin integrity will decrease Outcome: Progressing   

## 2018-01-15 NOTE — Progress Notes (Signed)
PROGRESS NOTE    Cory Foster.  NWG:[id number] DOB: 09/20/1956 DOA: 01/01/2018 PCP: [facility]   Brief Narrative:  Patient is a 62 year old male with past medical history of chronic alcohol abuse was admitted on 4/7 after he was found to be unresponsive at a hotel.  He was intubated in the emergency department.  He had a prolonged hospital course due to aspiration pneumonia and agitation, delirium.  He was extubated on 4/10 Patient was transferred to hospital service on 01/13/18. He was also managed for  delirium, agitation and sundowning.  Currently his mental status stable.  He is waiting for skilled nursing facility bed.    Assessment & Plan:   Active Problems:   Alcohol dependence (HCC)   Hyperlipidemia   Substance abuse (HCC)   Severe recurrent major depression without psychotic features (HCC)   Acute respiratory failure with hypercapnia (HCC)  Acute respiratory failure with hypoxia: Much improved.Status post extubation. Currently saturating fine on room air.We will try to mobilize. Acute respiratory failure with hypoxia most likely secondary to aspiration pneumonia on presentation.  He finished antibiotics course.  History of DM:  continue sliding scale insulin. On metformin at home  Hyperlipidemia: Continue statin  Anemia: Most likely chronic.  Might be associated with alcohol and nutritional deficiency.  Macrocytosis.  Mild thrombocytopenia.  Continue thiamine and folic acid.  Diabetes mellitus type 2: Continue sliding-scale insulin here.  Patient is on metformin at home.  Agitation/delirium: With  associated sundowning.  Promote sleep/wake cycle.  Minimize sleep interruptions.  Continue Zoloft, Klonopin.Finised course of Geodon. Patient appears very calm today.  No signs of alcohol withdrawal.   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  History of depression: Continue Zoloft.  Mood is stable at present  Deconditioning/debility: PT evaluated him and recommended skilled nursing facility on discharge.  Social worker consulted.  DVT prophylaxis: Hep [place] Code Status: Full Family Communication: None present at the bed side Disposition Plan: Pending PT evaluation  Consultants: PCCM  Procedures: Intubation on 4/7  Antimicrobials: None  Subjective: Patient seen and examined the bedside this morning.  Remains comfortable.  Slightly confused.  No overnight fever, nausea or vomiting. Objective: Vitals:   01/14/18 2013 01/15/18 0425 01/15/18 0428 01/15/18 1358  BP: 111/71  117/74 116/74  Pulse: 77  72 65  Resp: 20  18 14   Temp: 98.9 F (37.2 C)  98 F (36.7 C) 97.9 F (36.6 C)  TempSrc: Oral  Oral Oral  SpO2: 93%  93% 94%  Weight:  108.6 kg (239 lb 6.7 oz)    Height:        Intake/Output Summary (Last 24 hours) at 01/15/2018 1440 Last data filed at 01/15/2018 1000 Gross per 24 hour  Intake 700 ml  Output 2175 ml  Net -1475 ml   Filed Weights   01/14/18 0452 01/14/18 1201 01/15/18 0425  Weight: 111.7 kg (246 lb 4.1 oz) 111.7 kg (246 lb 4.1 oz) 108.6 kg (239 lb 6.7 oz)    Examination:  General exam: Appears calm and comfortable ,Not in distress,average built HEENT:PERRL,Oral mucosa moist, Ear/Nose normal on gross exam Respiratory system: Bilateral equal air entry, normal vesicular breath sounds, no wheezes or crackles  Cardiovascular system: S1 & S2 heard, RRR. No JVD, murmurs, rubs, gallops or clicks. Gastrointestinal system: Abdomen is nondistended, soft and nontender. No organomegaly or masses felt. Normal bowel sounds heard. Central nervous  system: Alert and oriented to place,person. Forgetful. No focal neurological deficits. Extremities: No edema, no clubbing ,no cyanosis, distal peripheral pulses palpable. Skin: No rashes, lesions or ulcers,no icterus ,no pallor MSK: Normal muscle bulk,tone  ,power Psychiatry: Judgement and insight appear normal. Mood & affect appropriate.     Data Reviewed: I have personally reviewed following labs and imaging studies  CBC: Recent Labs  Lab 01/09/18 0504 01/11/18 0437 01/13/18 0453  WBC 5.5 5.8 6.8  NEUTROABS 3.0 3.3 4.3  HGB 11.6* 10.9* 10.9*  HCT 34.9* 32.6* 32.8*  MCV 108.7* 109.4* 108.3*  PLT 120* 168 212   Basic Metabolic Panel: Recent Labs  Lab 01/09/18 0504 01/09/18 1533 01/11/18 0437 01/13/18 0453  NA 140 142 142 139  K 2.9* 3.6 4.0 3.8  CL 105 105 110 104  CO2 23 25 23 26   GLUCOSE 180* 187* 154* 122*  BUN 11 13 15 15   CREATININE 1.02 0.89 0.99 0.92  CALCIUM 8.4* 8.6* 8.6* 8.7*  MG 1.4*  --  1.7 1.9  PHOS 3.3  --  4.4 4.6   GFR: Estimated Creatinine Clearance: 112.1 mL/min (by C-G formula based on SCr of 0.92 mg/dL). Liver Function Tests: No results for input(s): AST, ALT, ALKPHOS, BILITOT, PROT, ALBUMIN in the last 168 hours. No results for input(s): LIPASE, AMYLASE in the last 168 hours. No results for input(s): AMMONIA in the last 168 hours. Coagulation Profile: No results for input(s): INR, PROTIME in the last 168 hours. Cardiac Enzymes: No results for input(s): CKTOTAL, CKMB, CKMBINDEX, TROPONINI in the last 168 hours. BNP (last 3 results) No results for input(s): PROBNP in the last 8760 hours. HbA1C: No results for input(s): HGBA1C in the last 72 hours. CBG: Recent Labs  Lab 01/14/18 2009 01/14/18 2354 01/15/18 0425 01/15/18 0734 01/15/18 1228  GLUCAP 126* 155* 126* 132* 116*   Lipid Profile: Recent Labs    01/14/18 0312  CHOL 113  HDL 16*  LDLCALC 69  TRIG [id number]  CHOLHDL 7.1   Thyroid Function Tests: No results for input(s): TSH, T4TOTAL, FREET4, T3FREE, THYROIDAB in the last 72 hours. Anemia Panel: No results for input(s): VITAMINB12, FOLATE, FERRITIN, TIBC, IRON, RETICCTPCT in the last 72 hours. Sepsis Labs: No results for input(s): PROCALCITON, LATICACIDVEN in the last 168  hours.  No results found for this or any previous visit (from the past 240 hour(s)).       Radiology Studies: No results found.      Scheduled Meds: . atorvastatin  40 mg Oral q1800  . clonazepam  2 mg Oral Daily  . docusate sodium  100 mg Oral BID  . folic acid  1 mg Oral Daily  . heparin  5,000 Units Subcutaneous Q8H  . insulin aspart  0-15 Units Subcutaneous Q4H  . multivitamin with minerals  1 tablet Oral Daily  . nicotine  21 mg Transdermal Daily  . sertraline  100 mg Oral Daily  . thiamine  100 mg Oral Daily   Continuous Infusions: . sodium chloride       LOS: 14 days    Time spent: More than 50% of that time was spent in counseling and/or coordination of care.      Burnadette PopAmrit Akelia Husted, MD [facility] Pager [phone]  If 7PM-7AM, please contact night-coverage [email or web address] Password [email or web address] 01/15/2018, 2:40 PM

## 2018-01-16 DIAGNOSIS — J9601 Acute respiratory failure with hypoxia: Secondary | ICD-10-CM

## 2018-01-16 DIAGNOSIS — F1020 Alcohol dependence, uncomplicated: Secondary | ICD-10-CM

## 2018-01-16 LAB — CBC WITH DIFFERENTIAL/PLATELET
BASOS PCT: 1 %
Basophils Absolute: 0.1 10*3/uL (ref 0.0–0.1)
Eosinophils Absolute: 0.2 10*3/uL (ref 0.0–0.7)
Eosinophils Relative: 3 %
HEMATOCRIT: 37.9 % — AB (ref 39.0–52.0)
HEMOGLOBIN: 12.8 g/dL — AB (ref 13.0–17.0)
LYMPHS ABS: 1.9 10*3/uL (ref 0.7–4.0)
LYMPHS PCT: 31 %
MCH: 35.5 pg — AB (ref 26.0–34.0)
MCHC: 33.8 g/dL (ref 30.0–36.0)
MCV: 105 fL — ABNORMAL HIGH (ref 78.0–100.0)
MONOS PCT: 12 %
Monocytes Absolute: 0.7 10*3/uL (ref 0.1–1.0)
NEUTROS ABS: 3.3 10*3/uL (ref 1.7–7.7)
Neutrophils Relative %: 53 %
Platelets: 315 10*3/uL (ref 150–400)
RBC: 3.61 MIL/uL — ABNORMAL LOW (ref 4.22–5.81)
RDW: 16.6 % — ABNORMAL HIGH (ref 11.5–15.5)
WBC: 6.1 10*3/uL (ref 4.0–10.5)

## 2018-01-16 LAB — BASIC METABOLIC PANEL
Anion gap: 12 (ref 5–15)
BUN: 13 mg/dL (ref 6–20)
CHLORIDE: 103 mmol/L (ref 101–111)
CO2: 24 mmol/L (ref 22–32)
CREATININE: 1.06 mg/dL (ref 0.61–1.24)
Calcium: 9.2 mg/dL (ref 8.9–10.3)
GFR calc non Af Amer: >60 mL/min (ref >60)
Glucose, Bld: 130 mg/dL — ABNORMAL HIGH (ref 65–99)
Potassium: 3.8 mmol/L (ref 3.5–5.1)
Sodium: 139 mmol/L (ref 135–145)

## 2018-01-16 LAB — GLUCOSE, CAPILLARY
GLUCOSE-CAPILLARY: 132 mg/dL — AB (ref 65–99)
GLUCOSE-CAPILLARY: 177 mg/dL — AB (ref 65–99)
Glucose-Capillary: 139 mg/dL — ABNORMAL HIGH (ref 65–99)
Glucose-Capillary: 138 mg/dL — ABNORMAL HIGH (ref 65–99)

## 2018-01-16 LAB — RASBURICASE - URIC ACID: Uric Acid, Serum: 7.2 mg/dL (ref 4.4–7.6)

## 2018-01-16 MED ORDER — PREMIER PROTEIN SHAKE
11.0000 [oz_av] | ORAL | Status: DC
  Administered 2018-01-16: 11 [oz_av] via ORAL
  Filled 2018-01-16: qty 325.31

## 2018-01-16 MED ORDER — PREMIER PROTEIN SHAKE: 11.0000 [oz_av] | ORAL | 0 refills | Status: DC

## 2018-01-16 MED ORDER — DOCUSATE SODIUM 100 MG PO CAPS: 100.0000 mg | ORAL_CAPSULE | Freq: Two times a day (BID) | ORAL | 0 refills | Status: DC

## 2018-01-16 MED ORDER — THIAMINE HCL 100 MG PO TABS: 100.0000 mg | ORAL_TABLET | Freq: Every day | ORAL | 0 refills | Status: DC

## 2018-01-16 MED ORDER — CLONAZEPAM 2 MG PO TABS: 2.0000 mg | ORAL_TABLET | Freq: Two times a day (BID) | ORAL | 0 refills | Status: DC

## 2018-01-16 MED ORDER — CYCLOBENZAPRINE HCL 5 MG PO TABS: 5.0000 mg | ORAL_TABLET | Freq: Three times a day (TID) | ORAL | 0 refills | Status: DC | PRN

## 2018-01-16 MED ORDER — INSULIN GLARGINE 100 UNIT/ML ~~LOC~~ SOLN: 10.0000 [IU] | Freq: Every day | SUBCUTANEOUS | 11 refills | Status: DC

## 2018-01-16 MED ORDER — FOLIC ACID 1 MG PO TABS: 1.0000 mg | ORAL_TABLET | Freq: Every day | ORAL | 1 refills | Status: DC

## 2018-01-16 MED ORDER — NICOTINE 21 MG/24HR TD PT24: 21.0000 mg | MEDICATED_PATCH | Freq: Every day | TRANSDERMAL | 0 refills | Status: DC

## 2018-01-16 NOTE — NC FL2 (Addendum)
[place] MEDICAID FL2 LEVEL OF CARE SCREENING TOOL     IDENTIFICATION  Patient Name: Cory MoralesRandall M Glassburn Jr. Birthdate: Jul 15, 1956 Sex: male Admission Date (Current Location): 01/01/2018  [facility] [place]   Facility and Address:  [facility],  [address], [place] [id number]      Provider Number: [id number]  Attending Physician Name and Address:  Richarda OverlieAbrol, Nayana, MD  Relative Name and Phone Number:       Current Level of Care: Hospital Recommended Level of Care: Skilled Nursing Facility Prior Approval Number:    Date Approved/Denied:   PASRR Number: [id number]   Discharge Plan: SNF    Current Diagnoses: Patient Active Problem List   Diagnosis Date Noted  . Acute respiratory failure with hypercapnia (HCC) 01/01/2018  . Severe recurrent major depression without psychotic features (HCC) 12/14/2017  . Diabetes (HCC) 12/14/2017  . Hypertension   . Hyperlipidemia   . GERD (gastroesophageal reflux disease)   . Depression   . Substance abuse (HCC)   . Anxiety   . DDD (degenerative disc disease)   . Psoriasis   . Alcohol dependence (HCC) 08/08/2013  . MDD (major depressive disorder) 08/08/2013  . GAD (generalized anxiety disorder) 08/08/2013    Orientation RESPIRATION BLADDER Height & Weight     Self, Time, Situation, Place  Normal Incontinent Weight: 238 lb 1.6 oz (108 kg) Height:  6\' 2"  (188 cm)  BEHAVIORAL SYMPTOMS/MOOD NEUROLOGICAL BOWEL NUTRITION STATUS      Continent Diet(regular diet)  AMBULATORY STATUS COMMUNICATION OF NEEDS Skin   Extensive Assist Verbally Normal                       Personal Care Assistance Level of Assistance  Bathing, Feeding, Dressing Bathing Assistance: Limited assistance Feeding assistance: Independent Dressing Assistance: Limited assistance     Functional Limitations Info  Sight, Hearing, Speech Sight Info: Adequate Hearing Info: Adequate Speech Info: Adequate    SPECIAL CARE  FACTORS FREQUENCY  PT (By licensed PT), OT (By licensed OT)     PT Frequency: 5x OT Frequency: 5x            Contractures Contractures Info: Not present    Additional Factors Info  Code Status, Allergies Code Status Info: full code Allergies Info:  Ceclor Cefaclor, Ciprofloxacin, Citalopram, Seroquel Quetiapine Fumarate           Current Medications (01/16/2018):  This is the current hospital active medication list Current Facility-Administered Medications  Medication Dose Route Frequency Provider Last Rate Last Dose  . 0.9 %  sodium chloride infusion  250 mL Intravenous PRN Hammonds, Curt JewsKathleen H, MD      . acetaminophen (TYLENOL) suppository 650 mg  650 mg Rectal Q6H PRN Hammonds, Curt JewsKathleen H, MD   650 mg at 01/09/18 1646   Or  . acetaminophen (TYLENOL) solution 650 mg  650 mg Per Tube Q6H PRN Hammonds, Curt JewsKathleen H, MD   650 mg at 01/03/18 1524  . atorvastatin (LIPITOR) tablet 40 mg  40 mg Oral q1800 Burnadette PopAdhikari, Amrit, MD   40 mg at 01/15/18 1654  . clonazePAM (KLONOPIN) disintegrating tablet 2 mg  2 mg Oral Daily Lynnell CatalanAgarwala, Ravi, MD   2 mg at 01/15/18 2012  . cyclobenzaprine (FLEXERIL) tablet 5 mg  5 mg Oral TID PRN Burnadette PopAdhikari, Amrit, MD   5 mg at 01/16/18 0824  . docusate sodium (COLACE) capsule 100 mg  100 mg Oral BID Lynnell CatalanAgarwala, Ravi, MD   100 mg  at 01/16/18 0824  . folic acid (FOLVITE) tablet 1 mg  1 mg Oral Daily Adhikari, Amrit, MD   1 mg at 01/16/18 0824  . haloperidol lactate (HALDOL) injection 5 mg  5 mg Intravenous Q6H PRN Lynnell Catalan, MD   5 mg at 01/08/18 0846  . heparin injection 5,000 Units  5,000 Units Subcutaneous Q8H Hammonds, Curt Jews, MD   5,000 Units at 01/16/18 0503  . ibuprofen (ADVIL,MOTRIN) tablet 200 mg  200 mg Oral Q6H PRN Mannam, Praveen, MD   200 mg at 01/16/18 0823  . insulin aspart (novoLOG) injection 0-15 Units  0-15 Units Subcutaneous Q4H Karl Ito, MD   2 Units at 01/16/18 0825  . ipratropium-albuterol (DUONEB) 0.5-2.5 (3) MG/3ML nebulizer  solution 3 mL  3 mL Nebulization Q6H PRN Agarwala, Ravi, MD      . labetalol (NORMODYNE,TRANDATE) injection 20 mg  20 mg Intravenous Q4H PRN Henry Russel, MD   20 mg at 01/07/18 1528  . multivitamin with minerals tablet 1 tablet  1 tablet Oral Daily Ollis, Brandi L, NP   1 tablet at 01/16/18 0823  . nicotine (NICODERM CQ - dosed in mg/24 hours) patch 21 mg  21 mg Transdermal Daily Lynnell Catalan, MD   21 mg at 01/16/18 5409  . ondansetron (ZOFRAN) injection 4 mg  4 mg Intravenous Q6H PRN Hammonds, Curt Jews, MD   4 mg at 01/12/18 1325  . polyethylene glycol (MIRALAX / GLYCOLAX) packet 17 g  17 g Oral Daily PRN Agarwala, Daleen Bo, MD      . protein supplement (PREMIER PROTEIN) liquid  11 oz Oral Q24H Abrol, Nayana, MD      . sertraline (ZOLOFT) tablet 100 mg  100 mg Oral Daily Ollis, Brandi L, NP   100 mg at 01/16/18 0824  . thiamine (VITAMIN B-1) tablet 100 mg  100 mg Oral Daily Burnadette Pop, MD   100 mg at 01/16/18 8119     Discharge Medications: Please see discharge summary for a list of discharge medications.  Relevant Imaging Results:  Relevant Lab Results:   Additional Information SS# [id number]  Nelwyn Salisbury, LCSW

## 2018-01-16 NOTE — Clinical Social Work Note (Signed)
Clinical Social Work Assessment  Patient Details  Name: Cory Foster. MRN: [id number] Date of Birth: 02-Feb-1956  Date of referral:  01/16/18               Reason for consult:  Facility Placement                Permission sought to share information with:    Permission granted to share information::     Name::        Agency::     Relationship::     Contact Information:     Housing/Transportation Living arrangements for the past 2 months:  Apartment Source of Information:  Patient, Medical Team Patient Interpreter Needed:  None Criminal Activity/Legal Involvement Pertinent to Current Situation/Hospitalization:  No - Comment as needed Significant Relationships:  Siblings Lives with:  Siblings Do you feel safe going back to the place where you live?  Yes Need for family participation in patient care:  No (Coment)  Care giving concerns:  Pt lives at home with his sister, reports he was recently [facility] at [facility] for suicidal thoughts and upon DC went to stay in hotel rather than return to sister's home. States he is looking for apartment to rent for himself.  Pt reports he was completely independent with ambulating and ADLs prior to this admission.   Social Worker assessment / plan:  CSW consulted to assist with SNF placement. Met with pt at bedside- he was alert, oriented, and welcoming of CSW involvement. Reports he is in agreement to pursue SNF for ST rehab at DC.  Pt was fixated on discussing how "he thinks his sister lied and got him falsely arrested, I'm working with a Chief Executive Officer." Responded to redirection however and was appropriate. Pt reports he is seeing OP provider for psychiatric issues (was hospitalized at [facility] last month for SI) and currently denies SI or any other acute psychiatric symptoms. CSW completed FL2 and made referrals- submitted PASRR which went to manual review. Will send additional information when requested.  Pt selected [facility]  center who obtained [facility] Medicare [place]. Pt can admit once PASRR approved.  Employment status:  Disabled (Comment on whether or not currently receiving Disability)(receives disability) Insurance information:  Arboriculturist) PT Recommendations:  [place] / Referral to community resources:  [place]  Patient/Family's Response to care:  Pt appreciative  Patient/Family's Understanding of and Emotional Response to Diagnosis, Current Treatment, and Prognosis:  Pt was abrupt with CSW re: his care as he was fixated on irrelevant topics- see above. Emotionally seemed anxious but stable. States, "I just need to get walking again and then I'll get an apartment."  Emotional Assessment Appearance:  Appears stated age Attitude/Demeanor/Rapport:  Engaged Affect (typically observed):  Accepting, Calm Orientation:  Oriented to Self, Oriented to Place, Oriented to  Time, Oriented to Situation Alcohol / Substance use:  Not Applicable Psych involvement (Current and /or in the community):  Yes (Comment)(recent inpatient hospitalization [facility], outpt provider at [facility])  Discharge Needs  Concerns to be addressed:  Discharge Planning Concerns Readmission within the last 30 days:  Yes Current discharge risk:  None Barriers to Discharge:  [place] ([place])   Nila Nephew, LCSW 01/16/2018, 1:49 PM  [phone]

## 2018-01-16 NOTE — Clinical Social Work Placement (Signed)
Pt discharged with [facility]- report [phone].  Will arrange PTAR transportation. Pt states he will inform son. DC information provided via the HUB.    CLINICAL SOCIAL WORK PLACEMENT  NOTE  Date:  01/16/2018  Patient Details  Name: Cory MoralesRandall M Salway Jr. MRN: [id number] Date of Birth: 04-05-1956  Clinical Social Work is seeking post-discharge placement for this patient at the Skilled  Nursing Facility level of care (*CSW will initial, date and re-position this form in  chart as items are completed):  Yes   Patient/family provided with [facility] Clinical Social Work Department's list of facilities offering this level of care within the geographic area requested by the patient (or if unable, by the patient's family).  Yes   Patient/family informed of their freedom to choose among providers that offer the needed level of care, that participate in Medicare, Medicaid or managed care program needed by the patient, have an available bed and are willing to accept the patient.  Yes   Patient/family informed of [facility]'s ownership interest in [facility] and [facility], as well as of the fact that they are under no obligation to receive care at these facilities.  PASRR submitted to EDS on 01/16/18     PASRR number received on 01/16/18     Existing PASRR number confirmed on       FL2 transmitted to all facilities in geographic area requested by pt/family on 01/16/18     FL2 transmitted to all facilities within larger geographic area on       Patient informed that his/her managed care company has contracts with or will negotiate with certain facilities, including the following:        Yes   Patient/family informed of bed offers received.  [facility]     Physician recommends and patient [facility]    [facility] on 01/16/18.  Patient to be  transferred to facility by PTAR     Patient family notified on 01/16/18 of transfer.  Name of family member notified:  Pt states he notified son     PHYSICIAN       Additional Comment:    _______________________________________________ Nelwyn SalisburyMeghan R Rilea Arutyunyan, LCSW 01/16/2018, 2:41 PM  [phone]

## 2018-01-16 NOTE — Discharge Summary (Signed)
Physician Discharge Summary  [name]. MRN: [id number] DOB/AGE: 1956-03-03 62 y.o.  PCP: [facility]   Admit date: 01/01/2018 Discharge date: 01/16/2018  Discharge Diagnoses:    Active Problems:   Alcohol dependence (HCC)   Hyperlipidemia   Substance abuse (HCC)   Severe recurrent major depression without psychotic features ([place])   Acute respiratory failure with hypercapnia ([place])    Follow-up recommendations Follow-up with PCP in 3-5 days , including all  additional recommended appointments as below Follow-up CBC, CMP, magnesium  in 3-5 days       Allergies as of 01/16/2018      Reactions   Ceclor [cefaclor]    unknown   Ciprofloxacin    unknown   Citalopram    Hyper    Seroquel [quetiapine Fumarate]    "hung over" feeling      Medication List    TAKE these medications   atorvastatin 80 MG tablet Commonly known as:  LIPITOR Take 80 mg by mouth daily.   clonazePAM 2 MG tablet Commonly known as:  KLONOPIN Take 1 tablet (2 mg total) by mouth 2 (two) times daily for 2 days.   cyclobenzaprine 5 MG tablet Commonly known as:  FLEXERIL Take 1 tablet (5 mg total) by mouth 3 (three) times daily as needed for muscle spasms.   docusate sodium 100 MG capsule Commonly known as:  COLACE Take 1 capsule (100 mg total) by mouth 2 (two) times daily.   fluticasone 50 MCG/ACT nasal spray Commonly known as:  FLONASE Place 1 spray into both nostrils daily as needed for allergies or rhinitis.   folic acid 1 MG tablet Commonly known as:  FOLVITE Take 1 tablet (1 mg total) by mouth daily. Start taking on:  01/17/2018   insulin glargine 100 UNIT/ML injection Commonly known as:  LANTUS Inject 0.1 mLs (10 Units total) into the skin at bedtime. What changed:  how much to take   Magnesium 250 MG Tabs Take 250 mg by mouth daily.   metFORMIN 500 MG tablet Commonly known as:  GLUCOPHAGE Take 1,000 mg by mouth 2 (two) times daily with a meal.    multivitamin with minerals Tabs tablet Take 1 tablet by mouth every morning.   nicotine 21 mg/24hr patch Commonly known as:  NICODERM CQ - dosed in mg/24 hours Place 1 patch (21 mg total) onto the skin daily. Start taking on:  01/17/2018   protein supplement shake Liqd Commonly known as:  PREMIER PROTEIN Take 325 mLs (11 oz total) by mouth daily.   sertraline 100 MG tablet Commonly known as:  ZOLOFT Take 1 tablet (100 mg total) by mouth 2 (two) times daily. For depression/anxiety   sitaGLIPtin 50 MG tablet Commonly known as:  JANUVIA Take 50 mg by mouth daily.   thiamine 100 MG tablet Take 1 tablet (100 mg total) by mouth daily. Start taking on:  01/17/2018   Vitamin D-3 5000 units Tabs Take 5,000 Units by mouth daily.        Discharge Condition:stable   Discharge Instructions Get Medicines reviewed and adjusted: Please take all your medications with you for your next visit with your Primary MD  Please request your Primary MD to go over all hospital tests and procedure/radiological results at the follow up, please ask your Primary MD to get all Hospital records sent to his/her office.  If you experience worsening of your admission symptoms, develop shortness of breath, life threatening emergency, suicidal or homicidal thoughts you must  seek medical attention immediately by calling 911 or calling your MD immediately if symptoms less severe.  You must read complete instructions/literature along with all the possible adverse reactions/side effects for all the Medicines you take and that have been prescribed to you. Take any new Medicines after you have completely understood and accpet all the possible adverse reactions/side effects.   Do not drive when taking Pain medications.   Do not take more than prescribed Pain, Sleep and Anxiety Medications  Special Instructions: If you have smoked or chewed Tobacco in the last 2 yrs please stop smoking, stop any regular Alcohol  and or any Recreational drug use.  Wear Seat belts while driving.  Please note  You were cared for by a hospitalist during your hospital stay. Once you are discharged, your primary care physician will handle any further medical issues. Please note that NO REFILLS for any discharge medications will be authorized once you are discharged, as it is imperative that you return to your primary care physician (or establish a relationship with a primary care physician if you do not have one) for your aftercare needs so that they can reassess your need for medications and monitor your lab values.     Allergies  Allergen Reactions  . Ceclor [Cefaclor]     unknown  . Ciprofloxacin     unknown  . Citalopram     Hyper   . Seroquel [Quetiapine Fumarate]     "hung over" feeling      Disposition: Discharge disposition: 03-Skilled Nursing Facility        Consults:   Critical care    Significant Diagnostic Studies:  Dg Abdomen 1 View  Result Date: 01/01/2018 CLINICAL DATA:  NG tube placement EXAM: ABDOMEN - 1 VIEW COMPARISON:  None. FINDINGS: Enteric tube tip is in the medial left upper quadrant with proximal side hole projected at or just above the EG junction. Advancement is suggested if placement in the stomach is desired. Surgical clips in the right upper quadrant. Postoperative changes in the lower lumbar spine. Mildly dilated gas-filled central small-bowel with suggestion of small bowel fold thickening. This could indicate early/partial obstruction or enteritis. No radiopaque stones. IMPRESSION: Enteric tube tip projects over the upper stomach with proximal side hole likely above the EG junction. Advancement is suggested. Mildly dilated gas-filled small bowel with suggestion of fold thickening. This could indicate early/partial obstruction or enteritis. Electronically Signed   By: Lucienne Capers M.D.   On: 01/01/2018 22:31   Ct Head Wo Contrast  Result Date: 01/01/2018 CLINICAL DATA:   Found unresponsive.  Intubated. EXAM: CT HEAD WITHOUT CONTRAST TECHNIQUE: Contiguous axial images were obtained from the base of the skull through the vertex without intravenous contrast. COMPARISON:  None. FINDINGS: Brain: Moderate generalized atrophy. No evidence of old or acute focal infarction, mass lesion, hemorrhage, hydrocephalus or extra-axial collection. Vascular: No abnormal vascular finding. Skull: Negative Sinuses/Orbits: Clear/normal Other: None IMPRESSION: No acute finding by CT.  Moderate atrophy. Electronically Signed   By: Nelson Chimes M.D.   On: 01/01/2018 14:51   Dg Chest Port 1 View  Result Date: 01/06/2018 CLINICAL DATA:  Follow-up aspiration pneumonia EXAM: PORTABLE CHEST 1 VIEW COMPARISON:  01/04/2018 FINDINGS: Endotracheal tube and nasogastric catheter have been removed in the interval. The cardiac shadow is stable. Poor inspiratory effort is noted with crowding of the vascular markings. Mild bibasilar atelectatic changes are seen. No bony abnormality is noted. IMPRESSION: Poor inspiratory effort with vascular crowding. Mild bibasilar atelectatic changes are  noted. Electronically Signed   By: Inez Catalina M.D.   On: 01/06/2018 07:52   Dg Chest Port 1 View  Result Date: 01/04/2018 CLINICAL DATA:  Respiratory failure EXAM: PORTABLE CHEST 1 VIEW COMPARISON:  01/03/2018 FINDINGS: Endotracheal tube and nasogastric catheter are noted in satisfactory position. Cardiac shadow is stable. The overall inspiratory effort is poor although no focal infiltrate is seen. Improved aeration in the bases is noted when compare with the prior exam. No bony abnormality is seen. IMPRESSION: Improved aeration when compare with the prior exam. Electronically Signed   By: Inez Catalina M.D.   On: 01/04/2018 07:42   Dg Chest Port 1 View  Result Date: 01/03/2018 CLINICAL DATA:  Acute respiratory failure with hypoxia. History of major depression, alcohol dependence and other substance abuse, diabetes, current  smoker. EXAM: PORTABLE CHEST 1 VIEW COMPARISON:  Chest x-ray of January 02, 2018 FINDINGS: The lungs are mildly hypoinflated. There are bibasilar densities which are more conspicuous today. There is no large pleural effusion and no pneumothorax. The heart is top-normal in size. The pulmonary vascularity is not clearly engorged. The endotracheal tube tip measures 9.2 cm above the carina. The esophagogastric tube tip and proximal port project below the GE junction. IMPRESSION: Bibasilar atelectasis or pneumonia more conspicuous today. Top-normal cardiac size with mild pulmonary vascular congestion. High positioning of the endotracheal tube. Advancement by 3-4 cm is recommended. Electronically Signed   By: David  Martinique M.D.   On: 01/03/2018 07:18   Dg Chest Port 1 View  Result Date: 01/02/2018 CLINICAL DATA:  62 y/o  M; respiratory failure. EXAM: PORTABLE CHEST 1 VIEW COMPARISON:  01/01/2018 chest radiograph FINDINGS: Endotracheal tube 5.6 cm from carina. Right lower lobe platelike atelectasis. No new focal consolidation. Stable cardiac silhouette. Calcific atherosclerosis of aorta. Enteric tube tip in proximal stomach. Bones are unremarkable. IMPRESSION: Stable platelike atelectasis in right lung base. No new active disease. Electronically Signed   By: Kristine Garbe M.D.   On: 01/02/2018 05:47   Dg Chest Portable 1 View  Result Date: 01/01/2018 CLINICAL DATA:  Endotracheal tube placement EXAM: PORTABLE CHEST 1 VIEW COMPARISON:  01/01/2018 FINDINGS: Endotracheal tube placed with tip measuring 4.7 cm above the carina. Enteric tube tip projects over the left upper quadrant, likely in the upper stomach. Shallow inspiration with atelectasis in the lung bases. Blunting of the costophrenic angles suggesting small effusions. Heart size is normal for technique. No pneumothorax. Calcification of the aorta. IMPRESSION: Endotracheal tube appears in satisfactory position. Shallow inspiration with atelectasis in the  lung bases. Aortic atherosclerosis. Electronically Signed   By: Lucienne Capers M.D.   On: 01/01/2018 22:32   Dg Chest Port 1 View  Result Date: 01/01/2018 CLINICAL DATA:  Pt unresponsive. Pt comes from [employer]. Pt was found unresponsive in hotel room by house cleaning and did not call 911 until about an hour later. Pt was found face down with ETOH bottles all over room. Smoker. EXAM: PORTABLE CHEST 1 VIEW COMPARISON:  09/01/2013 FINDINGS: Lung volumes are low. There is hazy opacity at the right lung base that is likely atelectasis. Pneumonia is not excluded but felt less likely. Remainder of the lungs is clear. No evidence of pulmonary edema. No pneumothorax. Cardiac silhouette is normal in size. No mediastinal or hilar masses. Skeletal structures are grossly intact. IMPRESSION: Mild right lung base opacity accentuated by low lung volumes. This is most likely atelectasis. Consider pneumonia or aspiration pneumonitis in the proper clinical setting. Electronically Signed  By: Lajean Manes M.D.   On: 01/01/2018 13:59   Dg Foot Complete Left  Result Date: 01/10/2018 CLINICAL DATA:  Left foot pain common no known injury, initial encounter EXAM: LEFT FOOT - COMPLETE 3+ VIEW COMPARISON:  None. FINDINGS: Mild generalized soft tissue swelling is seen. No acute fracture or dislocation is noted. IMPRESSION: Generalized soft tissue swelling without acute bony abnormality. Electronically Signed   By: Inez Catalina M.D.   On: 01/10/2018 16:57        Filed Weights   01/14/18 1201 01/15/18 0425 01/16/18 0412  Weight: 111.7 kg (246 lb 4.1 oz) 108.6 kg (239 lb 6.7 oz) 108 kg (238 lb 1.6 oz)     Microbiology: No results found for this or any previous visit (from the past 240 hour(s)).     Blood Culture    Component Value Date/Time   SDES  01/02/2018 0021    BLOOD RIGHT ANTECUBITAL Performed at [facility], [place] [address] [place], [place] [address]    SPECREQUEST  01/02/2018 0021     BOTTLES DRAWN AEROBIC AND ANAEROBIC Blood Culture adequate volume Performed at [facility] [address] [place], [place] [address]    CULT  01/02/2018 0021    NO GROWTH 5 DAYS Performed at [facility] [address], [place], [place] [address]    REPTSTATUS 01/07/2018 FINAL 01/02/2018 0021      Labs: Results for orders placed or performed during the hospital encounter of 01/01/18 (from the past 48 hour(s))  Glucose, capillary     Status: Abnormal   Collection Time: 01/14/18 12:05 PM  Result Value Ref Range   Glucose-Capillary 147 (H) 65 - 99 mg/dL  Glucose, capillary     Status: Abnormal   Collection Time: 01/14/18  5:16 PM  Result Value Ref Range   Glucose-Capillary 126 (H) 65 - 99 mg/dL  Glucose, capillary     Status: Abnormal   Collection Time: 01/14/18  8:09 PM  Result Value Ref Range   Glucose-Capillary 126 (H) 65 - 99 mg/dL  Glucose, capillary     Status: Abnormal   Collection Time: 01/14/18 11:54 PM  Result Value Ref Range   Glucose-Capillary 155 (H) 65 - 99 mg/dL  Glucose, capillary     Status: Abnormal   Collection Time: 01/15/18  4:25 AM  Result Value Ref Range   Glucose-Capillary 126 (H) 65 - 99 mg/dL  Glucose, capillary     Status: Abnormal   Collection Time: 01/15/18  7:34 AM  Result Value Ref Range   Glucose-Capillary 132 (H) 65 - 99 mg/dL  Glucose, capillary     Status: Abnormal   Collection Time: 01/15/18 12:28 PM  Result Value Ref Range   Glucose-Capillary 116 (H) 65 - 99 mg/dL  Glucose, capillary     Status: Abnormal   Collection Time: 01/15/18  4:02 PM  Result Value Ref Range   Glucose-Capillary 111 (H) 65 - 99 mg/dL  Glucose, capillary     Status: Abnormal   Collection Time: 01/15/18  7:59 PM  Result Value Ref Range   Glucose-Capillary 174 (H) 65 - 99 mg/dL  Glucose, capillary     Status: Abnormal   Collection Time: 01/15/18 11:47 PM  Result Value Ref Range   Glucose-Capillary 104 (H) 65 - 99 mg/dL  Glucose,  capillary     Status: Abnormal   Collection Time: 01/16/18  4:09 AM  Result Value Ref Range   Glucose-Capillary 139 (H) 65 -  99 mg/dL  CBC with Differential/Platelet     Status: Abnormal   Collection Time: 01/16/18  5:22 AM  Result Value Ref Range   WBC 6.1 4.0 - 10.5 K/uL   RBC 3.61 (L) 4.22 - 5.81 MIL/uL   Hemoglobin 12.8 (L) 13.0 - 17.0 g/dL   HCT 37.9 (L) 39.0 - 52.0 %   MCV 105.0 (H) 78.0 - 100.0 fL   MCH 35.5 (H) 26.0 - 34.0 pg   MCHC 33.8 30.0 - 36.0 g/dL   RDW 16.6 (H) 11.5 - 15.5 %   Platelets 315 150 - 400 K/uL   Neutrophils Relative % 53 %   Neutro Abs 3.3 1.7 - 7.7 K/uL   Lymphocytes Relative 31 %   Lymphs Abs 1.9 0.7 - 4.0 K/uL   Monocytes Relative 12 %   Monocytes Absolute 0.7 0.1 - 1.0 K/uL   Eosinophils Relative 3 %   Eosinophils Absolute 0.2 0.0 - 0.7 K/uL   Basophils Relative 1 %   Basophils Absolute 0.1 0.0 - 0.1 K/uL    Comment: Performed at [facility], [place] [address], [place], [place] [address]  Basic metabolic panel     Status: Abnormal   Collection Time: 01/16/18  5:22 AM  Result Value Ref Range   Sodium 139 135 - 145 mmol/L   Potassium 3.8 3.5 - 5.1 mmol/L   Chloride 103 101 - 111 mmol/L   CO2 24 22 - 32 mmol/L   Glucose, Bld 130 (H) 65 - 99 mg/dL   BUN 13 6 - 20 mg/dL   Creatinine, Ser 1.06 0.61 - 1.24 mg/dL   Calcium 9.2 8.9 - 10.3 mg/dL   GFR calc non Af Amer >60 >60 mL/min   GFR calc Af Amer >60 >60 mL/min    Comment: (NOTE) The eGFR has been calculated using the CKD EPI equation. This calculation has not been validated in all clinical situations. eGFR's persistently <60 mL/min signify possible Chronic Kidney Disease.    Anion gap 12 5 - 15    Comment: Performed at [facility], [place] [address] [place], [place] [address]  Glucose, capillary     Status: Abnormal   Collection Time: 01/16/18  7:28 AM  Result Value Ref Range   Glucose-Capillary 132 (H) 65 - 99 mg/dL     Lipid Panel     Component  Value Date/Time   CHOL 113 01/14/2018 0312   TRIG 141 01/14/2018 0312   HDL 16 (L) 01/14/2018 0312   CHOLHDL 7.1 01/14/2018 0312   VLDL 28 01/14/2018 0312   LDLCALC 69 01/14/2018 0312     Lab Results  Component Value Date   HGBA1C 5.8 (H) 08/08/2013     Lab Results  Component Value Date   LDLCALC 69 01/14/2018   CREATININE 1.06 01/16/2018     HPI   62 year old male with past medical history of chronic alcohol abuse was admitted on 4/7 after he was found to be unresponsive at a hotel.  He was intubated in the emergency department.  He had a prolonged hospital course due to aspiration pneumonia and agitation, delirium.  He was extubated on 4/10 Patient was transferred to hospital service on 01/13/18. He was also managed for  delirium, agitation and sundowning.  Currently his mental status stable.  He is waiting for skilled nursing facility bed    HOSPITAL COURSE:   Acute respiratory failure with hypoxia: Much improved.Status post extubation. Currently saturating fine on room air.  Acute respiratory  failure with hypoxia most likely secondary to aspiration pneumonia on presentation.  He finished antibiotics course. Patient has been evaluated by physical therapy and deemed appropriate for SNF with a rolling walker. He is a high fall risk  History of DM:  , decrease dose of Lantus to 10 units,continue metformin and Januvia, continue Accu-Cheks 3 times a day  Hyperlipidemia: Continue statin  Anemia: Most likely chronic.  Might be associated with alcohol and nutritional deficiency.  Macrocytosis.  Mild thrombocytopenia.  Continue thiamine and folic acid.   Agitation/delirium: With  associated sundowning.  Promote sleep/wake cycle.  Minimize sleep interruptions.  Continue Zoloft, Klonopin.Finised course of Geodon. Patient appears very calm   No signs of alcohol withdrawal.   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  History of depression: Continue Zoloft.  Mood is stable at present  Deconditioning/debility: PT evaluated him and recommended skilled nursing facility on discharge.  Social worker consulted.      Discharge Exam:   Blood pressure 133/86, pulse 72, temperature 98 F (36.7 C), temperature source Oral, resp. rate 18, height '6\' 2"'  (1.88 m), weight 108 kg (238 lb 1.6 oz), SpO2 96 %.  General exam: Appears calm and comfortable ,Not in distress,average built HEENT:PERRL,Oral mucosa moist, Ear/Nose normal on gross exam Respiratory system: Bilateral equal air entry, normal vesicular breath sounds, no wheezes or crackles  Cardiovascular system: S1 & S2 heard, RRR. No JVD, murmurs, rubs, gallops or clicks. Gastrointestinal system: Abdomen is nondistended, soft and nontender. No organomegaly or masses felt. Normal bowel sounds heard. Central nervous system: Alert and oriented to place,person. Forgetful. No focal neurological deficits. Extremities: No edema, no clubbing ,no cyanosis, distal peripheral pulses palpable      Follow-up Information    [place]. Call.   Why:  follow-up with PCP in 3-5 days Contact information: [address] [place] [address] [place] [address] [phone]           Signed: Reyne Dumas 01/16/2018, 10:55 AM        Time spent >1 hour

## 2018-01-16 NOTE — Progress Notes (Signed)
Nutrition Follow-up  DOCUMENTATION CODES:   Obesity unspecified  INTERVENTION:   Provide Premier Protein once daily, each supplement provides 160kcal and 30g protein.   NUTRITION DIAGNOSIS:   Inadequate oral intake related to inability to eat as evidenced by NPO status.  Resolved  GOAL:   Patient will meet greater than or equal to 90% of their needs  Meeting  MONITOR:   PO intake, Supplement acceptance, Weight trends, Labs  REASON FOR ASSESSMENT:   Ventilator, Consult Enteral/tube feeding initiation and management  ASSESSMENT:   62 yo male with hx of DM, HTN, depression, Etoh abuse, polysubstance abuse, GERD, psoriasis. He lives in a hotel and was found 4/7 AM unresponsive by housekeeping in his hotel room. Reportedly he was slumped over a desk and had soiled himself. The housekeeping left the room without intervention. They came back 2 hours later and found him still unresponsive so called 911. He was brought to the ER where he was found to be unresponsive or minimally responsive, opening eyes only to pain, documented as tachycardic and tachypneic with increased work of breathing, no gag. Patient required intubation for acute hypercapneic respiratory failure and failure to protect his airway.    Pt reports appetite is back to baseline. Diet advanced to regular 4/18. Meal completions charted as 75-100% for his last five meals. Pt did not have breakfast this morning because he does not like the taste of breakfast. Pt has not received Premier Protein. RD to order. Weight noted to decrease 17 lb since last RD visit. Will continue to monitor trends.   UOP- 1850 ml yesterday   Medications reviewed and include: folic acid, SSI, thiamine Labs reviewed.   Diet Order:  Diet regular Room service appropriate? Yes; Fluid consistency: Thin  EDUCATION NEEDS:   No education needs have been identified at this time  Skin:  Skin Assessment: Reviewed RN Assessment  Last BM:   01/15/18  Height:   Ht Readings from Last 1 Encounters:  01/14/18 6\' 2"  (1.88 m)    Weight:   Wt Readings from Last 1 Encounters:  01/16/18 238 lb 1.6 oz (108 kg)    Ideal Body Weight:  80.91 kg  BMI:  Body mass index is 30.57 kg/m.  Estimated Nutritional Needs:   Kcal:  [id number] (17-19 kcal/kg)  Protein:  115-128 grams (1-1.1 grams/kg)  Fluid:  >/= 2 L/day    Cory Foster RD, LDN Clinical Nutrition Pager # - [phone]

## 2018-02-21 ENCOUNTER — Emergency Department (HOSPITAL_BASED_OUTPATIENT_CLINIC_OR_DEPARTMENT_OTHER): Payer: Medicare Other

## 2018-02-21 ENCOUNTER — Other Ambulatory Visit: Payer: Self-pay

## 2018-02-21 ENCOUNTER — Encounter (HOSPITAL_BASED_OUTPATIENT_CLINIC_OR_DEPARTMENT_OTHER): Payer: Self-pay

## 2018-02-21 ENCOUNTER — Emergency Department (HOSPITAL_COMMUNITY)
Admission: EM | Admit: 2018-02-21 | Discharge: 2018-02-21 | Disposition: A | Discharge status: Home / Self Care | Payer: Medicare Other | Attending: Emergency Medicine | Admitting: Emergency Medicine

## 2018-02-21 DIAGNOSIS — F17210 Nicotine dependence, cigarettes, uncomplicated: Secondary | ICD-10-CM | POA: Insufficient documentation

## 2018-02-21 DIAGNOSIS — G4751 Confusional arousals: Secondary | ICD-10-CM | POA: Insufficient documentation

## 2018-02-21 DIAGNOSIS — E119 Type 2 diabetes mellitus without complications: Secondary | ICD-10-CM

## 2018-02-21 DIAGNOSIS — Z79899 Other long term (current) drug therapy: Secondary | ICD-10-CM

## 2018-02-21 DIAGNOSIS — Z9049 Acquired absence of other specified parts of digestive tract: Secondary | ICD-10-CM

## 2018-02-21 DIAGNOSIS — I10 Essential (primary) hypertension: Secondary | ICD-10-CM | POA: Insufficient documentation

## 2018-02-21 DIAGNOSIS — Z794 Long term (current) use of insulin: Secondary | ICD-10-CM | POA: Insufficient documentation

## 2018-02-21 DIAGNOSIS — F1020 Alcohol dependence, uncomplicated: Secondary | ICD-10-CM

## 2018-02-21 DIAGNOSIS — X30XXXA Exposure to excessive natural heat, initial encounter: Secondary | ICD-10-CM

## 2018-02-21 DIAGNOSIS — T679XXA Effect of heat and light, unspecified, initial encounter: Secondary | ICD-10-CM | POA: Insufficient documentation

## 2018-02-21 DIAGNOSIS — F329 Major depressive disorder, single episode, unspecified: Secondary | ICD-10-CM

## 2018-02-21 DIAGNOSIS — F419 Anxiety disorder, unspecified: Secondary | ICD-10-CM | POA: Insufficient documentation

## 2018-02-21 DIAGNOSIS — R410 Disorientation, unspecified: Secondary | ICD-10-CM | POA: Insufficient documentation

## 2018-02-21 LAB — CBC
HCT: 41.6 % (ref 39.0–52.0)
HEMOGLOBIN: 14.7 g/dL (ref 13.0–17.0)
MCH: 35.4 pg — AB (ref 26.0–34.0)
MCHC: 35.3 g/dL (ref 30.0–36.0)
MCV: 100.2 fL — ABNORMAL HIGH (ref 78.0–100.0)
PLATELETS: 207 10*3/uL (ref 150–400)
RBC: 4.15 MIL/uL — ABNORMAL LOW (ref 4.22–5.81)
RDW: 16.1 % — AB (ref 11.5–15.5)
WBC: 9.3 10*3/uL (ref 4.0–10.5)

## 2018-02-21 LAB — COMPREHENSIVE METABOLIC PANEL
ALBUMIN: 3.3 g/dL — AB (ref 3.5–5.0)
ALT: 19 U/L (ref 17–63)
AST: 32 U/L (ref 15–41)
Alkaline Phosphatase: 86 U/L (ref 38–126)
Anion gap: 11 (ref 5–15)
BILIRUBIN TOTAL: 0.7 mg/dL (ref 0.3–1.2)
BUN: 5 mg/dL — AB (ref 6–20)
CALCIUM: 8.1 mg/dL — AB (ref 8.9–10.3)
CO2: 22 mmol/L (ref 22–32)
CREATININE: 0.9 mg/dL (ref 0.61–1.24)
Chloride: 103 mmol/L (ref 101–111)
GFR calc Af Amer: >60 mL/min (ref >60)
GFR calc non Af Amer: >60 mL/min (ref >60)
GLUCOSE: 169 mg/dL — AB (ref 65–99)
Potassium: 3.1 mmol/L — ABNORMAL LOW (ref 3.5–5.1)
SODIUM: 136 mmol/L (ref 135–145)
TOTAL PROTEIN: 7.4 g/dL (ref 6.5–8.1)

## 2018-02-21 LAB — CBG MONITORING, ED: Glucose-Capillary: 189 mg/dL — ABNORMAL HIGH (ref 65–99)

## 2018-02-21 LAB — ETHANOL: ALCOHOL ETHYL (B): 201 mg/dL — AB (ref <10)

## 2018-02-21 NOTE — ED Triage Notes (Addendum)
Per friend pt had repetitive speech started at 2pm-was last normal at 7am-pt NAD-A/O-NAD-states "don't feel too good"-presents to triage in w/c-answers all ?s appropriately-appears under the influence with slow movement and slurred speech-admits to ETOH use today-denies drug use-pt [facility] prior to triage [facility] ED-son and friend of son with pt

## 2018-02-22 ENCOUNTER — Emergency Department (HOSPITAL_BASED_OUTPATIENT_CLINIC_OR_DEPARTMENT_OTHER)
Admission: EM | Admit: 2018-02-22 | Discharge: 2018-02-22 | Disposition: A | Discharge status: Home / Self Care | Payer: Medicare Other | Source: Home / Self Care | Attending: Emergency Medicine | Admitting: Emergency Medicine

## 2018-02-22 DIAGNOSIS — T679XXA Effect of heat and light, unspecified, initial encounter: Secondary | ICD-10-CM

## 2018-02-22 DIAGNOSIS — R410 Disorientation, unspecified: Secondary | ICD-10-CM

## 2018-02-22 NOTE — Discharge Instructions (Addendum)
Drink plenty of fluids and get plenty of rest. ° °Follow-up with your primary doctor if symptoms are not improving in the next few days. °
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
## 2018-03-07 ENCOUNTER — Encounter (HOSPITAL_COMMUNITY): Payer: Self-pay | Admitting: Emergency Medicine

## 2018-03-07 ENCOUNTER — Emergency Department (HOSPITAL_COMMUNITY)
Admission: EM | Admit: 2018-03-07 | Discharge: 2018-03-07 | Disposition: A | Discharge status: Home / Self Care | Payer: Medicare Other | Attending: Emergency Medicine | Admitting: Emergency Medicine

## 2018-03-07 ENCOUNTER — Emergency Department (HOSPITAL_COMMUNITY): Payer: Medicare Other

## 2018-03-07 DIAGNOSIS — R443 Hallucinations, unspecified: Secondary | ICD-10-CM | POA: Diagnosis not present

## 2018-03-07 DIAGNOSIS — Z79899 Other long term (current) drug therapy: Secondary | ICD-10-CM | POA: Insufficient documentation

## 2018-03-07 DIAGNOSIS — I10 Essential (primary) hypertension: Secondary | ICD-10-CM | POA: Insufficient documentation

## 2018-03-07 DIAGNOSIS — F17210 Nicotine dependence, cigarettes, uncomplicated: Secondary | ICD-10-CM | POA: Insufficient documentation

## 2018-03-07 DIAGNOSIS — F329 Major depressive disorder, single episode, unspecified: Secondary | ICD-10-CM | POA: Diagnosis not present

## 2018-03-07 DIAGNOSIS — F22 Delusional disorders: Secondary | ICD-10-CM

## 2018-03-07 DIAGNOSIS — Z7984 Long term (current) use of oral hypoglycemic drugs: Secondary | ICD-10-CM | POA: Insufficient documentation

## 2018-03-07 DIAGNOSIS — E785 Hyperlipidemia, unspecified: Secondary | ICD-10-CM | POA: Diagnosis not present

## 2018-03-07 LAB — CBC WITH DIFFERENTIAL/PLATELET
Basophils Absolute: 0 K/uL (ref 0.0–0.1)
Basophils Relative: 0 %
Eosinophils Absolute: 0.1 K/uL (ref 0.0–0.7)
Eosinophils Relative: 1 %
HCT: 41.1 % (ref 39.0–52.0)
Hemoglobin: 14.2 g/dL (ref 13.0–17.0)
Lymphocytes Relative: 18 %
Lymphs Abs: 1.9 K/uL (ref 0.7–4.0)
MCH: 34.9 pg — ABNORMAL HIGH (ref 26.0–34.0)
MCHC: 34.5 g/dL (ref 30.0–36.0)
MCV: 101 fL — ABNORMAL HIGH (ref 78.0–100.0)
Monocytes Absolute: 0.8 K/uL (ref 0.1–1.0)
Monocytes Relative: 8 %
Neutro Abs: 8 K/uL — ABNORMAL HIGH (ref 1.7–7.7)
Neutrophils Relative %: 73 %
Platelets: 154 K/uL (ref 150–400)
RBC: 4.07 MIL/uL — ABNORMAL LOW (ref 4.22–5.81)
RDW: 16.2 % — ABNORMAL HIGH (ref 11.5–15.5)
WBC: 10.8 K/uL — ABNORMAL HIGH (ref 4.0–10.5)

## 2018-03-07 LAB — I-STAT CHEM 8, ED
BUN: 11 mg/dL (ref 6–20)
CALCIUM ION: 1 mmol/L — AB (ref 1.15–1.40)
Chloride: 99 mmol/L — ABNORMAL LOW (ref 101–111)
Creatinine, Ser: 0.9 mg/dL (ref 0.61–1.24)
Glucose, Bld: 191 mg/dL — ABNORMAL HIGH (ref 65–99)
HEMATOCRIT: 42 % (ref 39.0–52.0)
Hemoglobin: 14.3 g/dL (ref 13.0–17.0)
POTASSIUM: 3.1 mmol/L — AB (ref 3.5–5.1)
SODIUM: 140 mmol/L (ref 135–145)
TCO2: 22 mmol/L (ref 22–32)

## 2018-03-07 LAB — URINALYSIS, ROUTINE W REFLEX MICROSCOPIC
Glucose, UA: NEGATIVE mg/dL
Hgb urine dipstick: NEGATIVE
Ketones, ur: 5 mg/dL — AB
Leukocytes, UA: NEGATIVE
Nitrite: NEGATIVE
Protein, ur: 100 mg/dL — AB
Specific Gravity, Urine: 1.03 (ref 1.005–1.030)
pH: 5 (ref 5.0–8.0)

## 2018-03-07 LAB — I-STAT TROPONIN, ED: Troponin i, poc: 0 ng/mL (ref 0.00–0.08)

## 2018-03-07 LAB — ETHANOL: Alcohol, Ethyl (B): <10 mg/dL (ref <10)

## 2018-03-07 LAB — SALICYLATE LEVEL: Salicylate Lvl: <7 mg/dL (ref 2.8–30.0)

## 2018-03-07 LAB — ACETAMINOPHEN LEVEL: Acetaminophen (Tylenol), Serum: <10 ug/mL — ABNORMAL LOW (ref 10–30)

## 2018-03-07 LAB — RAPID URINE DRUG SCREEN, HOSP PERFORMED
Amphetamines: NOT DETECTED
BARBITURATES: NOT DETECTED
BENZODIAZEPINES: POSITIVE — AB
Cocaine: NOT DETECTED
Opiates: NOT DETECTED
Tetrahydrocannabinol: NOT DETECTED

## 2018-03-07 LAB — BASIC METABOLIC PANEL WITH GFR
Anion gap: 12 (ref 5–15)
BUN: 13 mg/dL (ref 6–20)
CO2: 25 mmol/L (ref 22–32)
Calcium: 8.1 mg/dL — ABNORMAL LOW (ref 8.9–10.3)
Chloride: 102 mmol/L (ref 101–111)
Creatinine, Ser: 1.05 mg/dL (ref 0.61–1.24)
GFR calc Af Amer: >60 mL/min (ref >60)
GFR calc non Af Amer: >60 mL/min (ref >60)
Glucose, Bld: 194 mg/dL — ABNORMAL HIGH (ref 65–99)
Potassium: 3.1 mmol/L — ABNORMAL LOW (ref 3.5–5.1)
Sodium: 139 mmol/L (ref 135–145)
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
## 2018-03-07 NOTE — ED Notes (Signed)
Bed: RESB Expected date:  Expected time:  Means of arrival:  Comments: 62 yo M/Hallucinations-tachycardic

## 2018-03-07 NOTE — BH Assessment (Signed)
[facility] Assessment Progress Note  Per Jacqueline Norman, DO, this pt does not require psychiatric hospitalization at this time.  [facility] is to be discharged from [facility] with recommendation to follow up with [facility].  This has been included in pt's discharge instructions.  Pt would also benefit from seeing Peer Support Specialists; they will be asked to speak to pt.  Pt's nurse has been notified.  Jerusha Reising, MA Triage Specialist [phone]     

## 2018-03-07 NOTE — Patient Outreach (Signed)
[facility]PSS met with the patient and provided substance use recovery support. Patient states that he does not have a substance use issue with alcohol. Patient states that his son experiences drug addiction with heroin, cocaine, and other drugs. CPSS asked if he could provide substance use recovery resources for his son, and patient stated that he does not want to see his son ever again. CPSS also talked to the patient about Nar-anon support group, but patient was not interested. CPSS still provided [facility]PSS contact information. CPSS highly encouraged the patient to contact [facility] for substance use recovery support or help with recovery resources for him or for his son.

## 2018-03-07 NOTE — Discharge Instructions (Signed)
For your behavioral health needs, you are advised to follow up with [facility].  Contact them at your earliest opportunity to ask about scheduling an intake appointment: ° °     [facility] °     [address] °     [place], [place] [address] °     [phone] °

## 2018-03-07 NOTE — ED Provider Notes (Signed)
[facility]-EMERGENCY DEPT Provider Note   CSN: [id number] Arrival date & time: 03/07/18  0226     History   Chief Complaint Chief Complaint  Patient presents with  . Medical Clearance    HPI Cory Foster. is a 62 y.o. male.  HPI  This is a 62 year old male with history of depression, hypertension, hyperlipidemia, substance abuse who presents by EMS with reported hallucinations.  Per EMS, patient was hallucinating that "girls were good to shoot him up with drugs."  He has been living out of his car.  EMS noted that he was very anxious but cooperative.  Patient reports to me that his son has been "doing lots of drugs."  He reports that "he is trying to get these girls to shoot me out with heroin."  Patient states he has been out of all of his medications over the last 10 days or so.  He reports that he fell 1 week ago onto his left side and reports some persistent left chest pain and left hip pain.  He has been ambulatory.  Pain is currently 5 out of 10.  Patient denies any alcohol or drug use tonight.  He does have a history of alcohol dependence.  Patient denies any suicidal or homicidal ideas.  Past Medical History:  Diagnosis Date  . Anxiety   . DDD (degenerative disc disease)   . Depression   . GERD (gastroesophageal reflux disease)   . Hyperlipidemia   . Hypertension   . Psoriasis   . Substance abuse (HCC)   . Substance induced mood disorder (HCC)   . Varicose vein     Patient Active Problem List   Diagnosis Date Noted  . Acute respiratory failure with hypercapnia (HCC) 01/01/2018  . Severe recurrent major depression without psychotic features (HCC) 12/14/2017  . Diabetes (HCC) 12/14/2017  . Hypertension   . Hyperlipidemia   . GERD (gastroesophageal reflux disease)   . Depression   . Substance abuse (HCC)   . Anxiety   . DDD (degenerative disc disease)   . Psoriasis   . Alcohol dependence (HCC) 08/08/2013  . MDD (major depressive  disorder) 08/08/2013  . GAD (generalized anxiety disorder) 08/08/2013    Past Surgical History:  Procedure Laterality Date  . CHOLECYSTECTOMY    . SINUSOTOMY    . VEIN LIGATION AND STRIPPING          Home Medications    Prior to Admission medications   Medication Sig Start Date End Date Taking? Authorizing Provider  atorvastatin (LIPITOR) 80 MG tablet Take 80 mg by mouth daily.    Yes [provider]  clonazePAM (KLONOPIN) 2 MG tablet Take 1 tablet (2 mg total) by mouth 2 (two) times daily for 2 days. 01/16/18 03/07/18 Yes Richarda Overlie, MD  insulin glargine (LANTUS) 100 UNIT/ML injection Inject 0.1 mLs (10 Units total) into the skin at bedtime. Patient taking differently: Inject 16 Units into the skin at bedtime as needed (BS).  01/16/18  Yes Richarda Overlie, MD  metFORMIN (GLUCOPHAGE) 500 MG tablet Take 1,000 mg by mouth 2 (two) times daily with a meal.    Yes [provider]  sertraline (ZOLOFT) 100 MG tablet Take 1 tablet (100 mg total) by mouth 2 (two) times daily. For depression/anxiety 08/15/13  Yes Rachael Fee, MD  vitamin B-12 (CYANOCOBALAMIN) 1000 MCG tablet Take 1,000 mcg by mouth daily.   Yes [provider]  cyclobenzaprine (FLEXERIL) 5 MG tablet Take 1 tablet (5  mg total) by mouth 3 (three) times daily as needed for muscle spasms. Patient not taking: Reported on 03/07/2018 01/16/18   Richarda Overlie, MD  docusate sodium (COLACE) 100 MG capsule Take 1 capsule (100 mg total) by mouth 2 (two) times daily. Patient not taking: Reported on 03/07/2018 01/16/18   Richarda Overlie, MD  folic acid (FOLVITE) 1 MG tablet Take 1 tablet (1 mg total) by mouth daily. Patient not taking: Reported on 03/07/2018 01/17/18   Richarda Overlie, MD  nicotine (NICODERM CQ - DOSED IN MG/24 HOURS) 21 mg/24hr patch Place 1 patch (21 mg total) onto the skin daily. Patient not taking: Reported on 03/07/2018 01/17/18   Richarda Overlie, MD  protein supplement shake (PREMIER PROTEIN) LIQD Take  325 mLs (11 oz total) by mouth daily. Patient not taking: Reported on 03/07/2018 01/16/18   Richarda Overlie, MD  thiamine 100 MG tablet Take 1 tablet (100 mg total) by mouth daily. Patient not taking: Reported on 03/07/2018 01/17/18   Richarda Overlie, MD    Family History Family History  Problem Relation Age of Onset  . Depression Mother   . Anxiety disorder Mother   . Cancer Father     Social History Social History   Tobacco Use  . Smoking status: Current Every Day Smoker    Packs/day: 0.50    Types: Cigarettes  . Smokeless tobacco: Never Used  Substance Use Topics  . Alcohol use: Yes    Comment: daily  . Drug use: No     Allergies   Ceclor [cefaclor]; Ciprofloxacin; Citalopram; and Seroquel [quetiapine fumarate]   Review of Systems Review of Systems  Constitutional: Negative for fever.  Respiratory: Negative for shortness of breath.   Cardiovascular: Positive for chest pain.  Gastrointestinal: Negative for abdominal pain, nausea and vomiting.  Genitourinary: Negative for dysuria.  Musculoskeletal:       Left hip pain  Skin: Positive for color change and wound.  Psychiatric/Behavioral: Negative for suicidal ideas. The patient is nervous/anxious.   All other systems reviewed and are negative.    Physical Exam Updated Vital Signs BP 111/67 (BP Location: Left Arm)   Pulse 96   Temp 98.1 F (36.7 C) (Oral)   Resp 14   Ht 6\' 2"  (1.88 m)   Wt 97.5 kg (215 lb)   SpO2 93%   BMI 27.60 kg/m   Physical Exam  Constitutional: He is oriented to person, place, and time. He appears well-developed and well-nourished.  Disheveled appearing and anxious, nontoxic  HENT:  Head: Normocephalic and atraumatic.  Eyes: Pupils are equal, round, and reactive to light.  Neck: Neck supple.  Cardiovascular: Normal rate, regular rhythm and normal heart sounds.  No murmur heard. Pulmonary/Chest: Effort normal and breath sounds normal. No respiratory distress. He has no wheezes.    Tenderness palpation left chest wall without crepitus  Abdominal: Soft. Bowel sounds are normal. There is no tenderness. There is no rebound.  Musculoskeletal: He exhibits no edema.  Normal range of motion of the left hip  Lymphadenopathy:    He has no cervical adenopathy.  Neurological: He is alert and oriented to person, place, and time.  Skin: Skin is warm and dry.  Extensive bruising noted over the left hip and left lateral thigh  Psychiatric: He has a normal mood and affect.  Anxious appearing but cooperative  Nursing note and vitals reviewed.    ED Treatments / Results  Labs (all labs ordered are listed, but only abnormal results are displayed) Labs Reviewed  CBC WITH DIFFERENTIAL/PLATELET - Abnormal; Notable for the following components:      Result Value   WBC 10.8 (*)    RBC 4.07 (*)    MCV 101.0 (*)    MCH 34.9 (*)    RDW 16.2 (*)    Neutro Abs 8.0 (*)    All other components within normal limits  BASIC METABOLIC PANEL - Abnormal; Notable for the following components:   Potassium 3.1 (*)    Glucose, Bld 194 (*)    Calcium 8.1 (*)    All other components within normal limits  ACETAMINOPHEN LEVEL - Abnormal; Notable for the following components:   Acetaminophen (Tylenol), Serum <10 (*)    All other components within normal limits  URINALYSIS, ROUTINE W REFLEX MICROSCOPIC - Abnormal; Notable for the following components:   Color, Urine AMBER (*)    Bilirubin Urine MODERATE (*)    Ketones, ur 5 (*)    Protein, ur 100 (*)    Bacteria, UA RARE (*)    All other components within normal limits  RAPID URINE DRUG SCREEN, HOSP PERFORMED - Abnormal; Notable for the following components:   Benzodiazepines POSITIVE (*)    All other components within normal limits  I-STAT CHEM 8, ED - Abnormal; Notable for the following components:   Potassium 3.1 (*)    Chloride 99 (*)    Glucose, Bld 191 (*)    Calcium, Ion 1.00 (*)    All other components within normal limits   ETHANOL  SALICYLATE LEVEL  I-STAT TROPONIN, ED    EKG EKG Interpretation  Date/Time:  Tuesday March 07 2018 02:37:44 EDT Ventricular Rate:  105 PR Interval:    QRS Duration: 82 QT Interval:  392 QTC Calculation: 519 R Axis:   64 Text Interpretation:  Sinus tachycardia Prolonged QT interval Confirmed by Ross Marcus ([id number]) on 03/07/2018 3:37:08 AM   Radiology Dg Chest 2 View  Result Date: 03/07/2018 CLINICAL DATA:  Fall in the shower 3 days ago striking left side on tub. Left lower chest pain. Shortness of breath. EXAM: CHEST - 2 VIEW COMPARISON:  Chest radiograph 01/06/2018 FINDINGS: The cardiomediastinal contours are normal. The lungs are clear. Pulmonary vasculature is normal. No consolidation, pleural effusion, or pneumothorax. No visualized rib fracture. IMPRESSION: No acute findings.  No visualized rib fracture. Electronically Signed   By: Rubye Oaks M.D.   On: 03/07/2018 03:59   Dg Pelvis 1-2 Views  Result Date: 03/07/2018 CLINICAL DATA:  Fall in the shower 3 days ago striking left side on tub. Left hip hematoma. EXAM: PELVIS - 1-2 VIEW COMPARISON:  None. FINDINGS: The cortical margins of the bony pelvis are intact. No fracture. Pubic symphysis and sacroiliac joints are congruent. Both femoral heads are well-seated in the respective acetabula. Surgical hardware in the lower lumbar spine partially included. IMPRESSION: No pelvic fracture. Electronically Signed   By: Rubye Oaks M.D.   On: 03/07/2018 04:00    Procedures Procedures (including critical care time)  Medications Ordered in ED Medications - No data to display   Initial Impression / Assessment and Plan / ED Course  I have reviewed the triage vital signs and the nursing notes.  Pertinent labs & imaging results that were available during my care of the patient were reviewed by me and considered in my medical decision making (see chart for details).     Patient presents with reported  hallucinations.  He is cooperative with me.  His vital signs are reassuring.  Does  report some chest and left hip pain after fall last week.  He denies any active hallucinations to me.  I am unable to obtain any collateral information at this time.  It is unclear whether he actually hallucinated or is telling the truth.  He was seen several days ago for some altered mental status and was noted to have alcohol intoxication at this time.  Today his blood alcohol level is negative.  Will have TTS evaluate and attempt to further corroborate patient's story.  Final Clinical Impressions(s) / ED Diagnoses   Final diagnoses:  None    ED Discharge Orders    None       Ulysse Siemen, Mayer Maskerourtney F, MD 03/07/18 [phone]

## 2018-03-07 NOTE — ED Triage Notes (Signed)
Pt arriving via EMS with anxiety and hallucinations. Pts son states pt was hallucinating and stating he saw a girl in the back seat of their car and that she was trying to "shoot him up with drugs". Pt has been living out of his car. Large bruising on left side and hip noted. Pt reports he fell a couple days ago and may have cracked a rib. Pt noticeably anxious but cooperative.

## 2018-04-06 ENCOUNTER — Other Ambulatory Visit: Payer: Self-pay

## 2018-04-06 ENCOUNTER — Encounter (HOSPITAL_COMMUNITY): Payer: Self-pay

## 2018-04-06 ENCOUNTER — Emergency Department (HOSPITAL_COMMUNITY)
Admission: EM | Admit: 2018-04-06 | Discharge: 2018-04-06 | Disposition: A | Discharge status: Home / Self Care | Payer: Medicare Other | Attending: Emergency Medicine | Admitting: Emergency Medicine

## 2018-04-06 DIAGNOSIS — E119 Type 2 diabetes mellitus without complications: Secondary | ICD-10-CM | POA: Insufficient documentation

## 2018-04-06 DIAGNOSIS — F17210 Nicotine dependence, cigarettes, uncomplicated: Secondary | ICD-10-CM | POA: Insufficient documentation

## 2018-04-06 DIAGNOSIS — Z590 Homelessness: Secondary | ICD-10-CM | POA: Insufficient documentation

## 2018-04-06 DIAGNOSIS — E785 Hyperlipidemia, unspecified: Secondary | ICD-10-CM | POA: Insufficient documentation

## 2018-04-06 DIAGNOSIS — Z79899 Other long term (current) drug therapy: Secondary | ICD-10-CM | POA: Insufficient documentation

## 2018-04-06 DIAGNOSIS — Z794 Long term (current) use of insulin: Secondary | ICD-10-CM | POA: Insufficient documentation

## 2018-04-06 DIAGNOSIS — I10 Essential (primary) hypertension: Secondary | ICD-10-CM | POA: Insufficient documentation

## 2018-04-06 DIAGNOSIS — T675XXA Heat exhaustion, unspecified, initial encounter: Secondary | ICD-10-CM

## 2018-04-06 LAB — CBG MONITORING, ED: GLUCOSE-CAPILLARY: 193 mg/dL — AB (ref 70–99)

## 2018-04-06 NOTE — ED Provider Notes (Signed)
[facility]-EMERGENCY DEPT Provider Note   CSN: [id number] Arrival date & time: 04/06/18  1847     History   Chief Complaint Chief Complaint  Patient presents with  . Heat Exposure    HPI Cory MoralesRandall M Handel Jr. is a 62 y.o. male.  HPI Patient is a [age] year old male who currently has nowhere to stay.  [place] [facility] [place] [place].  He was states he been walking for greater than 6 hours and became tired and fatigued [facility]  They [facility] him there and started IV fluids and transferred him here for further evaluation.  The patient has received 1 L normal saline.  He feels much better at this time.  He has no focus complaints at this time except for some mild cramping in his sides.  He states he is hungry and would like something to eat.  Is been given a [place] sandwich and juice and milk.  No fevers or chills.  No chest pain or shortness of breath.  No abdominal pain.  No headache.  No weakness of his arms or legs   Past Medical History:  Diagnosis Date  . Anxiety   . DDD (degenerative disc disease)   . Depression   . GERD (gastroesophageal reflux disease)   . Hyperlipidemia   . Hypertension   . Psoriasis   . Substance abuse (HCC)   . Substance induced mood disorder (HCC)   . Varicose vein     Patient Active Problem List   Diagnosis Date Noted  . Acute respiratory failure with hypercapnia (HCC) 01/01/2018  . Severe recurrent major depression without psychotic features (HCC) 12/14/2017  . Diabetes (HCC) 12/14/2017  . Hypertension   . Hyperlipidemia   . GERD (gastroesophageal reflux disease)   . Depression   . Substance abuse (HCC)   . Anxiety   . DDD (degenerative disc disease)   . Psoriasis   . Alcohol dependence (HCC) 08/08/2013  . MDD (major depressive disorder) 08/08/2013  . GAD (generalized anxiety disorder)  08/08/2013    Past Surgical History:  Procedure Laterality Date  . CHOLECYSTECTOMY    . SINUSOTOMY    . VEIN LIGATION AND STRIPPING          Home Medications    Prior to Admission medications   Medication Sig Start Date End Date Taking? Authorizing Provider  atorvastatin (LIPITOR) 80 MG tablet Take 80 mg by mouth daily.     [provider]  clonazePAM (KLONOPIN) 2 MG tablet Take 1 tablet (2 mg total) by mouth 2 (two) times daily for 2 days. 01/16/18 03/07/18  Richarda OverlieAbrol, Nayana, MD  cyclobenzaprine (FLEXERIL) 5 MG tablet Take 1 tablet (5 mg total) by mouth 3 (three) times daily as needed for muscle spasms. Patient not taking: Reported on 03/07/2018 01/16/18   Richarda OverlieAbrol, Nayana, MD  docusate sodium (COLACE) 100 MG capsule Take 1 capsule (100 mg total) by mouth 2 (two) times daily. Patient not taking: Reported on 03/07/2018 01/16/18   Richarda OverlieAbrol, Nayana, MD  folic acid (FOLVITE) 1 MG tablet Take 1 tablet (1 mg total) by mouth daily. Patient not taking: Reported on 03/07/2018 01/17/18   Richarda OverlieAbrol, Nayana, MD  insulin glargine (LANTUS) 100 UNIT/ML injection Inject 0.1 mLs (10 Units total) into the skin at bedtime. Patient taking differently: Inject 16 Units into the skin at bedtime as needed (BS).  01/16/18  Richarda Overlie, MD  metFORMIN (GLUCOPHAGE) 500 MG tablet Take 1,000 mg by mouth 2 (two) times daily with a meal.     [provider]  nicotine (NICODERM CQ - DOSED IN MG/24 HOURS) 21 mg/24hr patch Place 1 patch (21 mg total) onto the skin daily. Patient not taking: Reported on 03/07/2018 01/17/18   Richarda Overlie, MD  protein supplement shake (PREMIER PROTEIN) LIQD Take 325 mLs (11 oz total) by mouth daily. Patient not taking: Reported on 03/07/2018 01/16/18   Richarda Overlie, MD  sertraline (ZOLOFT) 100 MG tablet Take 1 tablet (100 mg total) by mouth 2 (two) times daily. For depression/anxiety 08/15/13   Rachael Fee, MD  thiamine 100 MG tablet Take 1 tablet (100 mg total) by mouth  daily. Patient not taking: Reported on 03/07/2018 01/17/18   Richarda Overlie, MD  vitamin B-12 (CYANOCOBALAMIN) 1000 MCG tablet Take 1,000 mcg by mouth daily.    [provider]    Family History Family History  Problem Relation Age of Onset  . Depression Mother   . Anxiety disorder Mother   . Cancer Father     Social History Social History   Tobacco Use  . Smoking status: Current Every Day Smoker    Packs/day: 0.50    Types: Cigarettes  . Smokeless tobacco: Never Used  Substance Use Topics  . Alcohol use: Yes    Comment: daily  . Drug use: No     Allergies   Ceclor [cefaclor]; Ciprofloxacin; Citalopram; and Seroquel [quetiapine fumarate]   Review of Systems Review of Systems  All other systems reviewed and are negative.    Physical Exam Updated Vital Signs BP 131/87 (BP Location: Left Arm) Comment: Simultaneous filing. User may not have seen previous data.  Pulse 95 Comment: Simultaneous filing. User may not have seen previous data.  Resp 18   Ht 6\' 2"  (1.88 m)   Wt 97.5 kg (215 lb)   SpO2 95% Comment: Simultaneous filing. User may not have seen previous data.  BMI 27.60 kg/m   Physical Exam  Constitutional: He is oriented to person, place, and time. He appears well-developed and well-nourished.  HENT:  Head: Normocephalic and atraumatic.  Eyes: EOM are normal.  Neck: Normal range of motion.  Cardiovascular: Normal rate, regular rhythm, normal heart sounds and intact distal pulses.  Pulmonary/Chest: Effort normal and breath sounds normal. No respiratory distress.  Abdominal: Soft. He exhibits no distension. There is no tenderness.  Musculoskeletal: Normal range of motion.  Neurological: He is alert and oriented to person, place, and time.  Skin: Skin is warm and dry.  Psychiatric: He has a normal mood and affect. Judgment normal.  Nursing note and vitals reviewed.    ED Treatments / Results  Labs (all labs ordered are listed, but only abnormal  results are displayed) Labs Reviewed  CBG MONITORING, ED - Abnormal; Notable for the following components:      Result Value   Glucose-Capillary 193 (*)    All other components within normal limits    EKG None  Radiology No results found.  Procedures Procedures (including critical care time)  Medications Ordered in ED Medications - No data to display   Initial Impression / Assessment and Plan / ED Course  I have reviewed the triage vital signs and the nursing notes.  Pertinent labs & imaging results that were available during my care of the patient were reviewed by me and considered in my medical decision making (see chart for details).  Patient is stable for discharge from the emergency department.  His vital signs are stable.  He has been given something to eat and drink.  He is told that he is welcome to wait in our waiting room to the night.  He is been given a list of shelters and social services here in the community.  He is on disability.  He states he will receive his next disability check on 3 August.  Final Clinical Impressions(s) / ED Diagnoses   Final diagnoses:  Heat exhaustion, initial encounter    ED Discharge Orders    None       Azalia Bilis, MD 04/06/18 1921

## 2018-04-06 NOTE — ED Notes (Signed)
Bed: [id number] Expected date:  Expected time:  Means of arrival:  Comments: Heat exposure

## 2018-04-06 NOTE — ED Notes (Signed)
Discharge instructions reviewed with pt. Pt verbalized understanding. Pt given references for support in the area. Pt ambulatory to waiting room.

## 2018-04-06 NOTE — ED Triage Notes (Signed)
Per EMS- Patient has been outside walking x 6 hours and was feeling lightheaded with nausea. [facility]. EKG-NSR. Patient received NS 300 ml prior to arrival to the ED. Lungs clear, alert and oriented.

## 2018-08-01 ENCOUNTER — Encounter: Payer: Self-pay | Admitting: *Deleted

## 2018-08-01 ENCOUNTER — Emergency Department
Admission: EM | Admit: 2018-08-01 | Discharge: 2018-08-02 | Disposition: A | Discharge status: Home / Self Care | Payer: Medicare Other | Attending: Emergency Medicine | Admitting: Emergency Medicine

## 2018-08-01 ENCOUNTER — Other Ambulatory Visit: Payer: Self-pay

## 2018-08-01 DIAGNOSIS — F1020 Alcohol dependence, uncomplicated: Secondary | ICD-10-CM | POA: Diagnosis present

## 2018-08-01 DIAGNOSIS — F1994 Other psychoactive substance use, unspecified with psychoactive substance-induced mood disorder: Secondary | ICD-10-CM | POA: Diagnosis not present

## 2018-08-01 DIAGNOSIS — E119 Type 2 diabetes mellitus without complications: Secondary | ICD-10-CM | POA: Diagnosis not present

## 2018-08-01 DIAGNOSIS — F329 Major depressive disorder, single episode, unspecified: Secondary | ICD-10-CM | POA: Insufficient documentation

## 2018-08-01 DIAGNOSIS — Z794 Long term (current) use of insulin: Secondary | ICD-10-CM | POA: Diagnosis not present

## 2018-08-01 DIAGNOSIS — F17210 Nicotine dependence, cigarettes, uncomplicated: Secondary | ICD-10-CM | POA: Insufficient documentation

## 2018-08-01 DIAGNOSIS — F1010 Alcohol abuse, uncomplicated: Secondary | ICD-10-CM | POA: Insufficient documentation

## 2018-08-01 DIAGNOSIS — Z79899 Other long term (current) drug therapy: Secondary | ICD-10-CM | POA: Diagnosis not present

## 2018-08-01 DIAGNOSIS — R45851 Suicidal ideations: Secondary | ICD-10-CM | POA: Diagnosis not present

## 2018-08-01 DIAGNOSIS — I10 Essential (primary) hypertension: Secondary | ICD-10-CM | POA: Insufficient documentation

## 2018-08-01 DIAGNOSIS — F332 Major depressive disorder, recurrent severe without psychotic features: Secondary | ICD-10-CM | POA: Diagnosis present

## 2018-08-01 LAB — CBC
HEMATOCRIT: 43.4 % (ref 39.0–52.0)
Hemoglobin: 15.2 g/dL (ref 13.0–17.0)
MCH: 33 pg (ref 26.0–34.0)
MCHC: 35 g/dL (ref 30.0–36.0)
MCV: 94.3 fL (ref 80.0–100.0)
Platelets: 167 10*3/uL (ref 150–400)
RBC: 4.6 MIL/uL (ref 4.22–5.81)
RDW: 14.4 % (ref 11.5–15.5)
WBC: 7.4 10*3/uL (ref 4.0–10.5)
nRBC: 0 % (ref 0.0–0.2)

## 2018-08-01 LAB — COMPREHENSIVE METABOLIC PANEL
ALT: 25 U/L (ref 0–44)
ANION GAP: 9 (ref 5–15)
AST: 33 U/L (ref 15–41)
Albumin: 3.3 g/dL — ABNORMAL LOW (ref 3.5–5.0)
Alkaline Phosphatase: 70 U/L (ref 38–126)
BILIRUBIN TOTAL: 0.8 mg/dL (ref 0.3–1.2)
CO2: 31 mmol/L (ref 22–32)
CREATININE: 0.76 mg/dL (ref 0.61–1.24)
Calcium: 8.5 mg/dL — ABNORMAL LOW (ref 8.9–10.3)
Chloride: 105 mmol/L (ref 98–111)
GFR calc Af Amer: >60 mL/min (ref >60)
Glucose, Bld: 117 mg/dL — ABNORMAL HIGH (ref 70–99)
POTASSIUM: 3.8 mmol/L (ref 3.5–5.1)
Sodium: 145 mmol/L (ref 135–145)
TOTAL PROTEIN: 6.5 g/dL (ref 6.5–8.1)

## 2018-08-01 LAB — ETHANOL: ALCOHOL ETHYL (B): 298 mg/dL — AB (ref <10)

## 2018-08-01 LAB — SALICYLATE LEVEL: Salicylate Lvl: <7 mg/dL (ref 2.8–30.0)

## 2018-08-01 LAB — ACETAMINOPHEN LEVEL: Acetaminophen (Tylenol), Serum: <10 ug/mL — ABNORMAL LOW (ref 10–30)

## 2018-08-01 MED ORDER — LORAZEPAM 2 MG PO TABS
0.0000 mg | ORAL_TABLET | Freq: Four times a day (QID) | ORAL | Status: DC
  Administered 2018-08-02: 1 mg via ORAL
  Administered 2018-08-02: 2 mg via ORAL
  Filled 2018-08-01 (×2): qty 1

## 2018-08-01 MED ORDER — LORAZEPAM 2 MG/ML IJ SOLN: 0.0000 mg | Freq: Four times a day (QID) | INTRAMUSCULAR | Status: DC

## 2018-08-01 MED ORDER — LORAZEPAM 2 MG/ML IJ SOLN: 0.0000 mg | Freq: Two times a day (BID) | INTRAMUSCULAR | Status: DC

## 2018-08-01 MED ORDER — LORAZEPAM 2 MG PO TABS: 0.0000 mg | ORAL_TABLET | Freq: Two times a day (BID) | ORAL | Status: DC

## 2018-08-01 MED ORDER — VITAMIN B-1 100 MG PO TABS
100.0000 mg | ORAL_TABLET | Freq: Every day | ORAL | Status: DC
  Administered 2018-08-02: 100 mg via ORAL
  Filled 2018-08-01: qty 1

## 2018-08-01 MED ORDER — THIAMINE HCL 100 MG/ML IJ SOLN: 100.0000 mg | Freq: Every day | INTRAMUSCULAR | Status: DC

## 2018-08-01 NOTE — ED Notes (Signed)
BEHAVIORAL HEALTH ROUNDING Patient sleeping: Yes.   Patient alert and oriented: not applicable SLEEPING Behavior appropriate: Yes.  ; If no, describe: SLEEPING Nutrition and fluids offered: No SLEEPING Toileting and hygiene offered: NoSLEEPING Sitter present: not applicable, Q 15 min safety rounds and observation via security camera. Law enforcement present: Yes ODS 

## 2018-08-01 NOTE — ED Notes (Addendum)
Pt brought into ED BHU via sally port and wand with metal detector for safety by [employer] officer. Patient oriented to unit/care area: Pt informed of unit policies and procedures.  Informed that, for their safety, care areas are designed for safety and monitored by security cameras at all times; and visiting hours explained to patient. Patient verbalizes understanding, and verbal contract for safety obtained.Pt shown to their room.   BEHAVIORAL HEALTH ROUNDING  Patient sleeping: No.  Patient alert and oriented: yes  Behavior appropriate: Yes. ; If no, describe:  Nutrition and fluids offered: Yes  Toileting and hygiene offered: Yes  Sitter present: not applicable, Q 15 min safety rounds and observation via security camera. Law enforcement present: Yes ODS  ENVIRONMENTAL ASSESSMENT  Potentially harmful objects out of patient reach: Yes.  Personal belongings secured: Yes.  Patient dressed in hospital provided attire only: Yes.  Plastic bags out of patient reach: Yes.  Patient care equipment (cords, cables, call bells, lines, and drains) shortened, removed, or accounted for: Yes.  Equipment and supplies removed from bottom of stretcher: Yes.  Potentially toxic materials out of patient reach: Yes.  Sharps container removed or out of patient reach: Yes.   ED BHU PLACEMENT JUSTIFICATION  Is the patient under IVC or is there intent for IVC: Yes.  Is the patient medically cleared: Yes.  Is there vacancy in the ED BHU: Yes.  Is the population mix appropriate for patient: Yes.  Is the patient awaiting placement in inpatient or outpatient setting: Yes.  Has the patient had a psychiatric consult: Yes.  Survey of unit performed for contraband, proper placement and condition of furniture, tampering with fixtures in bathroom, shower, and each patient room: Yes. ; Findings: All clear  APPEARANCE/BEHAVIOR  Agitated but cooperative and adequate rapport can be established  NEURO ASSESSMENT  Orientation: time,  place and person  Hallucinations: No.None noted (Hallucinations)  Speech: Normal  Gait: normal  RESPIRATORY ASSESSMENT  WNL  CARDIOVASCULAR ASSESSMENT  WNL  GASTROINTESTINAL ASSESSMENT  WNL  EXTREMITIES  WNL  PLAN OF CARE  Provide calm/safe environment. Vital signs assessed TID. ED BHU Assessment once each 12-hour shift. Collaborate with TTS daily or as condition indicates. Assure the ED provider has rounded once each shift. Provide and encourage hygiene. Provide redirection as needed. Assess for escalating behavior; address immediately and inform ED provider.  Assess family dynamic and appropriateness for visitation as needed: Yes. ; If necessary, describe findings:  Educate the patient/family about BHU procedures/visitation: Yes. ; If necessary, describe findings: Pt is calm and cooperative at this time. Pt understanding and accepting of unit procedures/rules. Will continue to monitor with Q 15 min safety rounds and observation via security camera.   Sandwich tray and drink given.

## 2018-08-01 NOTE — ED Provider Notes (Signed)
[facility] Emergency Department Provider Note  Time seen: 7:11 PM  I have reviewed the triage vital signs and the nursing notes.   HISTORY  Chief Complaint Suicidal    HPI Cory Agard. is a 62 y.o. male with a past medical history of anxiety, depression, gastric reflux, hypertension, hyperlipidemia, substance abuse, presents to the emergency department for depression, dark stool alcohol intoxication.  According to the patient he ran out of Klonopin 1 week ago.  States when he runs out of Klonopin he begins drinking heavily.  States when he drinks heavily he develops black stool.  States he has had dark stool incontinence today.  Patient is very depressed and tearful saying he does not care if he lives anymore.  Denies any recreational drugs.  Denies any abdominal pain chest pain largely negative review of systems otherwise.   Past Medical History:  Diagnosis Date  . Anxiety   . DDD (degenerative disc disease)   . Depression   . GERD (gastroesophageal reflux disease)   . Hyperlipidemia   . Hypertension   . Psoriasis   . Substance abuse (HCC)   . Substance induced mood disorder (HCC)   . Varicose vein     Patient Active Problem List   Diagnosis Date Noted  . Acute respiratory failure with hypercapnia (HCC) 01/01/2018  . Severe recurrent major depression without psychotic features (HCC) 12/14/2017  . Diabetes (HCC) 12/14/2017  . Hypertension   . Hyperlipidemia   . GERD (gastroesophageal reflux disease)   . Depression   . Substance abuse (HCC)   . Anxiety   . DDD (degenerative disc disease)   . Psoriasis   . Alcohol dependence (HCC) 08/08/2013  . MDD (major depressive disorder) 08/08/2013  . GAD (generalized anxiety disorder) 08/08/2013    Past Surgical History:  Procedure Laterality Date  . CHOLECYSTECTOMY    . SINUSOTOMY    . VEIN LIGATION AND STRIPPING      Prior to Admission medications   Medication Sig Start Date End Date  Taking? Authorizing Provider  atorvastatin (LIPITOR) 80 MG tablet Take 80 mg by mouth daily.     [provider]  clonazePAM (KLONOPIN) 2 MG tablet Take 1 tablet (2 mg total) by mouth 2 (two) times daily for 2 days. 01/16/18 03/07/18  Richarda Overlie, MD  cyclobenzaprine (FLEXERIL) 5 MG tablet Take 1 tablet (5 mg total) by mouth 3 (three) times daily as needed for muscle spasms. Patient not taking: Reported on 03/07/2018 01/16/18   Richarda Overlie, MD  docusate sodium (COLACE) 100 MG capsule Take 1 capsule (100 mg total) by mouth 2 (two) times daily. Patient not taking: Reported on 03/07/2018 01/16/18   Richarda Overlie, MD  folic acid (FOLVITE) 1 MG tablet Take 1 tablet (1 mg total) by mouth daily. Patient not taking: Reported on 03/07/2018 01/17/18   Richarda Overlie, MD  insulin glargine (LANTUS) 100 UNIT/ML injection Inject 0.1 mLs (10 Units total) into the skin at bedtime. Patient taking differently: Inject 16 Units into the skin at bedtime as needed (BS).  01/16/18   Richarda Overlie, MD  metFORMIN (GLUCOPHAGE) 500 MG tablet Take 1,000 mg by mouth 2 (two) times daily with a meal.     [provider]  nicotine (NICODERM CQ - DOSED IN MG/24 HOURS) 21 mg/24hr patch Place 1 patch (21 mg total) onto the skin daily. Patient not taking: Reported on 03/07/2018 01/17/18   Richarda Overlie, MD  protein supplement shake (PREMIER PROTEIN) LIQD Take 325  mLs (11 oz total) by mouth daily. Patient not taking: Reported on 03/07/2018 01/16/18   Richarda Overlie, MD  sertraline (ZOLOFT) 100 MG tablet Take 1 tablet (100 mg total) by mouth 2 (two) times daily. For depression/anxiety 08/15/13   Rachael Fee, MD  thiamine 100 MG tablet Take 1 tablet (100 mg total) by mouth daily. Patient not taking: Reported on 03/07/2018 01/17/18   Richarda Overlie, MD  vitamin B-12 (CYANOCOBALAMIN) 1000 MCG tablet Take 1,000 mcg by mouth daily.    [provider]    Allergies  Allergen Reactions  . Ceclor [Cefaclor]     unknown   . Ciprofloxacin     unknown  . Citalopram     Hyper   . Seroquel [Quetiapine Fumarate]     "hung over" feeling    Family History  Problem Relation Age of Onset  . Depression Mother   . Anxiety disorder Mother   . Cancer Father     Social History Social History   Tobacco Use  . Smoking status: Current Every Day Smoker    Packs/day: 0.50    Types: Cigarettes  . Smokeless tobacco: Never Used  Substance Use Topics  . Alcohol use: Yes    Comment: daily  . Drug use: No    Review of Systems Constitutional: Negative for fever. Cardiovascular: Negative for chest pain. Respiratory: Negative for shortness of breath. Gastrointestinal: Negative for abdominal pain, vomiting.  Positive for dark stool. Genitourinary: Negative for urinary compaints Musculoskeletal: Negative for musculoskeletal complaints Skin: Negative for skin complaints  Neurological: Negative for headache All other ROS negative  ____________________________________________   PHYSICAL EXAM:  VITAL SIGNS: ED Triage Vitals  Enc Vitals Group     BP 08/01/18 1857 116/66     Pulse Rate 08/01/18 1857 87     Resp 08/01/18 1857 20     Temp 08/01/18 1857 98.2 F (36.8 C)     Temp Source 08/01/18 1857 Oral     SpO2 08/01/18 1857 98 %     Weight 08/01/18 1858 214 lb (97.1 kg)     Height 08/01/18 1858 6\' 2"  (1.88 m)     Head Circumference --      Peak Flow --      Pain Score 08/01/18 1858 0     Pain Loc --      Pain Edu? --      Excl. in GC? --    Constitutional:is awake and alert somewhat slurred speech admits alcohol intoxication.  Tearful. Eyes: Normal exam ENT   Head: Normocephalic and atraumatic.   Mouth/Throat: Mucous membranes are moist. Cardiovascular: Normal rate, regular rhythm.  Respiratory: Normal respiratory effort without tachypnea nor retractions. Breath sounds are clear Gastrointestinal: Soft and nontender. No distention. Musculoskeletal: Nontender with normal range of motion in  all extremities. Neurologic: Slightly slurred speech and normal language. No gross focal neurologic deficits Skin:  Skin is warm, dry and intact.  Psychiatric: Mood and affect are normal.  ____________________________________________   INITIAL IMPRESSION / ASSESSMENT AND PLAN / ED COURSE  Pertinent labs & imaging results that were available during my care of the patient were reviewed by me and considered in my medical decision making (see chart for details).  Patient presents to the emergency department for alcohol intoxication depression, suicidal ideation.  We will place the patient under IVC, have psychiatry see the patient as well as TTS.  We will check labs.  As far as the patient is dark stool complaint rectal exam performed  by myself shows light brown stool, guaiac negative.  We will still check basic labs as precaution.  Patient agreeable to plan of care.  Patient's labs are resulted with elevated alcohol level 298 otherwise largely at baseline.  Patient awaiting tele-psychiatry and TTS evaluation.  ____________________________________________   FINAL CLINICAL IMPRESSION(S) / ED DIAGNOSES  Alcohol intoxication Suicidal ideation Depression    Minna Antis, MD 08/02/18 0002

## 2018-08-01 NOTE — ED Triage Notes (Signed)
Pt brought in via ems from a boarding home with feelings of SI.  Pt has been out of klonopin for several weeks.  Pt reports drinking 30 beers in 2 days.  Pt has had no food in 3 days.  Pt tearful

## 2018-08-01 NOTE — ED Notes (Signed)
Patient dressed out by this Clinical research associate and ED tech. Patient had 2 pks cigarettes, black shorts, black shirt and black sandals, black smart phone and charger. Patient denies HI/AVH. Patient states having SI to OD on medications.

## 2018-08-02 DIAGNOSIS — F1994 Other psychoactive substance use, unspecified with psychoactive substance-induced mood disorder: Secondary | ICD-10-CM

## 2018-08-02 LAB — GLUCOSE, CAPILLARY
GLUCOSE-CAPILLARY: 128 mg/dL — AB (ref 70–99)
Glucose-Capillary: 110 mg/dL — ABNORMAL HIGH (ref 70–99)

## 2018-08-02 MED ORDER — ATORVASTATIN CALCIUM 20 MG PO TABS: 80.0000 mg | ORAL_TABLET | Freq: Every day | ORAL | Status: DC

## 2018-08-02 MED ORDER — METFORMIN HCL 500 MG PO TABS
1000.0000 mg | ORAL_TABLET | Freq: Two times a day (BID) | ORAL | Status: DC
  Administered 2018-08-02: 1000 mg via ORAL
  Filled 2018-08-02: qty 2

## 2018-08-02 NOTE — ED Notes (Addendum)
Hourly rounding reveals patient sleeping in room. No complaints, stable, in no acute distress. Q15 minute rounds and monitoring via Security Cameras to continue. 
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
## 2018-08-02 NOTE — Consult Note (Signed)
[facility] Face-to-Face Psychiatry Consult   Reason for Consult: Consult for this 62 year old man with a history of alcohol abuse and depression who came into the emergency room last night intoxicated Referring Physician: Mariea Clonts Patient Identification: Cory Foster. MRN:  [id number] Principal Diagnosis: Substance induced mood disorder ([place]) Diagnosis:   Patient Active Problem List   Diagnosis Date Noted  . Substance induced mood disorder ([place]) [F19.94] 08/02/2018  . Acute respiratory failure with hypercapnia ([place]) [J96.02] 01/01/2018  . Severe recurrent major depression without psychotic features ([place]) [F33.2] 12/14/2017  . Diabetes ([place]) [E11.9] 12/14/2017  . Hypertension [I10]   . Hyperlipidemia [E78.5]   . GERD (gastroesophageal reflux disease) [K21.9]   . Depression [F32.9]   . Substance abuse ([place]) [F19.10]   . Anxiety [F41.9]   . DDD (degenerative disc disease) [IMO0002]   . Psoriasis [L40.9]   . Alcohol dependence ([place]) [F10.20] 08/08/2013  . MDD (major depressive disorder) [F32.9] 08/08/2013  . GAD (generalized anxiety disorder) [F41.1] 08/08/2013    Total Time spent with patient: 1 hour  Subjective:   Cory Foster. is a 62 y.o. male patient admitted with "I am a lot better today".  HPI: Patient seen and chart reviewed.  62 year old man with alcohol abuse problems came into the emergency room last night with a blood alcohol level around 300.  Patient was described at that time as being very sad and depressed and saying he did not care whether he lived or died.  Today the patient apparently is in much better spirits.  He says he got upset yesterday when his sister came over and started arguing with him because she did not approve of how he was managing his money.  He says whenever that happens his mood gets very bad and he escalates his drinking.  Patient denies currently having any thought or wish to harm himself.  Denies suicidal or homicidal ideation.  Denies any  psychosis.  He admits that he has been drinking but minimizes it saying he is only drinking "about 5 beers" a day.  Denies other drug use.  Patient blames his active substance abuse on the fact that he ran out of his clonazepam recently.  It sounds like he gets his clonazepam prescribed by his primary care doctor and then runs out of it almost every month because he is probably overtaking it.  She has refused to refill it early this time.  Social history: Patient lives in a boardinghouse.  Maintains sporadic con tact with his family of origin.  Not working.  Medical history: History of hypertension diabetes gastric reflux symptoms  Substance abuse history: Long history of alcohol abuse.  No known history of seizures or delirium tremens.  Has been in detox programs and has made some effort to stay sober in the past although his insight about it seems to be minimal.  Past Psychiatric History: Patient has been seen many times previously in our emergency room's.  He has had previous admissions also.  There have been times when he has continued to endorse depression with suicidal thoughts even when sober but at this time he appears to be coming back to her baseline without any suicidality.  History of noncompliance with outpatient treatment.  Risk to Self:   Risk to Others:   Prior Inpatient Therapy:   Prior Outpatient Therapy:    Past Medical History:  Past Medical History:  Diagnosis Date  . Anxiety   . DDD (degenerative disc disease)   . Depression   .  GERD (gastroesophageal reflux disease)   . Hyperlipidemia   . Hypertension   . Psoriasis   . Substance abuse ([place])   . Substance induced mood disorder ([place])   . Varicose vein     Past Surgical History:  Procedure Laterality Date  . CHOLECYSTECTOMY    . SINUSOTOMY    . VEIN LIGATION AND STRIPPING     Family History:  Family History  Problem Relation Age of Onset  . Depression Mother   . Anxiety disorder Mother   . Cancer Father     Family Psychiatric  History: Unknown Social History:  Social History   Substance and Sexual Activity  Alcohol Use Yes   Comment: daily     Social History   Substance and Sexual Activity  Drug Use No    Social History   Socioeconomic History  . Marital status: Divorced    Spouse name: Not on file  . Number of children: Not on file  . Years of education: Not on file  . Highest education level: Not on file  Occupational History  . Not on file  Social Needs  . Financial resource strain: Not on file  . Food insecurity:    Worry: Not on file    Inability: Not on file  . Transportation needs:    Medical: Not on file    Non-medical: Not on file  Tobacco Use  . Smoking status: Current Every Day Smoker    Packs/day: 0.50    Types: Cigarettes  . Smokeless tobacco: Never Used  Substance and Sexual Activity  . Alcohol use: Yes    Comment: daily  . Drug use: No  . Sexual activity: Not on file  Lifestyle  . Physical activity:    Days per week: Not on file    Minutes per session: Not on file  . Stress: Not on file  Relationships  . Social connections:    Talks on phone: Not on file    Gets together: Not on file    Attends religious service: Not on file    Active member of club or organization: Not on file    Attends meetings of clubs or organizations: Not on file    Relationship status: Not on file  Other Topics Concern  . Not on file  Social History Narrative  . Not on file   Additional Social History:    Allergies:   Allergies  Allergen Reactions  . Ceclor [Cefaclor]     unknown  . Ciprofloxacin     unknown  . Citalopram     Hyper   . Seroquel [Quetiapine Fumarate]     "hung over" feeling    Labs:  Results for orders placed or performed during the hospital encounter of 08/01/18 (from the past 48 hour(s))  CBC     Status: None   Collection Time: 08/01/18  8:10 PM  Result Value Ref Range   WBC 7.4 4.0 - 10.5 K/uL   RBC 4.60 4.22 - 5.81 MIL/uL    Hemoglobin 15.2 13.0 - 17.0 g/dL   HCT 43.4 39.0 - 52.0 %   MCV 94.3 80.0 - 100.0 fL   MCH 33.0 26.0 - 34.0 pg   MCHC 35.0 30.0 - 36.0 g/dL   RDW 14.4 11.5 - 15.5 %   Platelets 167 150 - 400 K/uL   nRBC 0.0 0.0 - 0.2 %    Comment: Performed at [facility], [address], [place], [place] [address]  Comprehensive metabolic panel  Status: Abnormal   Collection Time: 08/01/18  8:10 PM  Result Value Ref Range   Sodium 145 135 - 145 mmol/L   Potassium 3.8 3.5 - 5.1 mmol/L   Chloride 105 98 - 111 mmol/L   CO2 31 22 - 32 mmol/L   Glucose, Bld 117 (H) 70 - 99 mg/dL   BUN <5 (L) 8 - 23 mg/dL   Creatinine, Ser 0.76 0.61 - 1.24 mg/dL   Calcium 8.5 (L) 8.9 - 10.3 mg/dL   Total Protein 6.5 6.5 - 8.1 g/dL   Albumin 3.3 (L) 3.5 - 5.0 g/dL   AST 33 15 - 41 U/L   ALT 25 0 - 44 U/L   Alkaline Phosphatase 70 38 - 126 U/L   Total Bilirubin 0.8 0.3 - 1.2 mg/dL   GFR calc non Af Amer >60 >60 mL/min   GFR calc Af Amer >60 >60 mL/min    Comment: (NOTE) The eGFR has been calculated using the CKD EPI equation. This calculation has not been validated in all clinical situations. eGFR's persistently <60 mL/min signify possible Chronic Kidney Disease.    Anion gap 9 5 - 15    Comment: Performed at [facility], [address] [place], [place] [address]  Ethanol     Status: Abnormal   Collection Time: 08/01/18  8:10 PM  Result Value Ref Range   Alcohol, Ethyl (B) 298 (H) <10 mg/dL    Comment: (NOTE) Lowest detectable limit for serum alcohol is 10 mg/dL. For medical purposes only. Performed at [facility], [address] [place], [place] [address]   Acetaminophen level     Status: Abnormal   Collection Time: 08/01/18  8:10 PM  Result Value Ref Range   Acetaminophen (Tylenol), Serum <10 (L) 10 - 30 ug/mL    Comment: (NOTE) Therapeutic concentrations vary significantly. A range of 10-30 ug/mL  may be an effective concentration for many patients. However, some   are best treated at concentrations outside of this range. Acetaminophen concentrations >150 ug/mL at 4 hours after ingestion  and >50 ug/mL at 12 hours after ingestion are often associated with  toxic reactions. Performed at [facility], [address] [place], [place] [address]   Salicylate level     Status: None   Collection Time: 08/01/18  8:10 PM  Result Value Ref Range   Salicylate Lvl <1.3 2.8 - 30.0 mg/dL    Comment: Performed at [facility], [address] [place],  [address]  Glucose, capillary     Status: Abnormal   Collection Time: 08/02/18  7:55 AM  Result Value Ref Range   Glucose-Capillary 128 (H) 70 - 99 mg/dL  Glucose, capillary     Status: Abnormal   Collection Time: 08/02/18 12:18 PM  Result Value Ref Range   Glucose-Capillary 110 (H) 70 - 99 mg/dL    Current Facility-Administered Medications  Medication Dose Route Frequency Provider Last Rate Last Dose  . atorvastatin (LIPITOR) tablet 80 mg  80 mg Oral q1800 Eula Listen, MD      . LORazepam (ATIVAN) injection 0-4 mg  0-4 mg Intravenous Q6H Harvest Dark, MD       Or  . LORazepam (ATIVAN) tablet 0-4 mg  0-4 mg Oral Q6H Harvest Dark, MD   1 mg at 08/02/18 1014  . [START ON 08/04/2018] LORazepam (ATIVAN) injection 0-4 mg  0-4 mg Intravenous Q12H Harvest Dark, MD       Or  . Derrill Memo ON 08/04/2018] LORazepam (  ATIVAN) tablet 0-4 mg  0-4 mg Oral Q12H Paduchowski, Lennette Bihari, MD      . metFORMIN (GLUCOPHAGE) tablet 1,000 mg  1,000 mg Oral BID WC Gregor Hams, MD   1,000 mg at 08/02/18 0805  . thiamine (VITAMIN B-1) tablet 100 mg  100 mg Oral Daily Harvest Dark, MD   100 mg at 08/02/18 1000   Or  . thiamine (B-1) injection 100 mg  100 mg Intravenous Daily Harvest Dark, MD       Current Outpatient Medications  Medication Sig Dispense Refill  . atorvastatin (LIPITOR) 80 MG tablet Take 80 mg by mouth daily.     . clonazePAM (KLONOPIN) 2 MG tablet Take 1  tablet (2 mg total) by mouth 2 (two) times daily for 2 days. 15 tablet 0  . cyclobenzaprine (FLEXERIL) 5 MG tablet Take 1 tablet (5 mg total) by mouth 3 (three) times daily as needed for muscle spasms. (Patient not taking: Reported on 03/07/2018) 30 tablet 0  . docusate sodium (COLACE) 100 MG capsule Take 1 capsule (100 mg total) by mouth 2 (two) times daily. (Patient not taking: Reported on 03/07/2018) 10 capsule 0  . folic acid (FOLVITE) 1 MG tablet Take 1 tablet (1 mg total) by mouth daily. (Patient not taking: Reported on 03/07/2018) 30 tablet 1  . insulin glargine (LANTUS) 100 UNIT/ML injection Inject 0.1 mLs (10 Units total) into the skin at bedtime. (Patient taking differently: Inject 16 Units into the skin at bedtime as needed (BS). ) 10 mL 11  . metFORMIN (GLUCOPHAGE) 500 MG tablet Take 1,000 mg by mouth 2 (two) times daily with a meal.     . nicotine (NICODERM CQ - DOSED IN MG/24 HOURS) 21 mg/24hr patch Place 1 patch (21 mg total) onto the skin daily. (Patient not taking: Reported on 03/07/2018) 28 patch 0  . protein supplement shake (PREMIER PROTEIN) LIQD Take 325 mLs (11 oz total) by mouth daily. (Patient not taking: Reported on 03/07/2018) 30 Can 0  . sertraline (ZOLOFT) 100 MG tablet Take 1 tablet (100 mg total) by mouth 2 (two) times daily. For depression/anxiety 60 tablet 0  . thiamine 100 MG tablet Take 1 tablet (100 mg total) by mouth daily. (Patient not taking: Reported on 03/07/2018) 30 tablet 0  . vitamin B-12 (CYANOCOBALAMIN) 1000 MCG tablet Take 1,000 mcg by mouth daily.      Musculoskeletal: Strength & Muscle Tone: within normal limits Gait & Station: normal Patient leans: N/A  Psychiatric Specialty Exam: Physical Exam  Nursing note and vitals reviewed. Constitutional: He appears well-developed and well-nourished.  HENT:  Head: Normocephalic and atraumatic.  Eyes: Pupils are equal, round, and reactive to light. Conjunctivae are normal.  Neck: Normal range of motion.   Cardiovascular: Regular rhythm and normal heart sounds.  Respiratory: Effort normal. No respiratory distress.  GI: Soft.  Musculoskeletal: Normal range of motion.  Neurological: He is alert.  Skin: Skin is warm and dry.  Psychiatric: He has a normal mood and affect. His speech is normal and behavior is normal. He expresses impulsivity. He expresses no homicidal and no suicidal ideation. He exhibits abnormal recent memory.    Review of Systems  Constitutional: Negative.   HENT: Negative.   Eyes: Negative.   Respiratory: Negative.   Cardiovascular: Negative.   Gastrointestinal: Negative.   Musculoskeletal: Negative.   Skin: Negative.   Neurological: Negative.   Psychiatric/Behavioral: Positive for memory loss and substance abuse. Negative for depression, hallucinations and suicidal ideas. The patient is nervous/anxious.  The patient does not have insomnia.     Blood pressure (!) 150/91, pulse 85, temperature 98 F (36.7 C), temperature source Oral, resp. rate 18, height 6' 2" (1.88 m), weight 97.1 kg, SpO2 98 %.Body mass index is 27.48 kg/m.  General Appearance: Disheveled  Eye Contact:  Fair  Speech:  Clear and Coherent  Volume:  Decreased  Mood:  Euthymic  Affect:  Constricted  Thought Process:  Coherent  Orientation:  Full (Time, Place, and Person)  Thought Content:  Logical  Suicidal Thoughts:  No  Homicidal Thoughts:  No  Memory:  Immediate;   Fair Recent;   Poor Remote;   Fair  Judgement:  Impaired  Insight:  Shallow  Psychomotor Activity:  Decreased  Concentration:  Concentration: Fair  Recall:  [employer] of Knowledge:  Fair  Language:  Fair  Akathisia:  No  Handed:  Right  AIMS (if indicated):     Assets:  Desire for Improvement Housing Social Support  ADL's:  Impaired  Cognition:  WNL  Sleep:        Treatment Plan Summary: Plan 62 year old man with alcohol abuse has now sobered up.  He is completely denying any suicidal thoughts.  No evidence of acute  psychosis.  Patient is asking to be released home.  No longer meets commitment criteria.  Did some psychoeducation and supportive counseling encouraging him to focus more on trying to stop drinking.  No change to any medicine.  Case reviewed with emergency room doctor and TTS.  Discontinue IVC.  Disposition: No evidence of imminent risk to self or others at present.   Patient does not meet criteria for psychiatric inpatient admission. Supportive therapy provided about ongoing stressors. Discussed crisis plan, support from social network, calling 911, coming to the Emergency Department, and calling Suicide Hotline.  Alethia Berthold, MD 08/02/2018 1:14 PM

## 2018-08-02 NOTE — ED Notes (Signed)
Pt restless, states hearing people singing. No one on the unit is awake or singing.

## 2018-08-02 NOTE — ED Notes (Signed)
Patient discharged home, patient received discharge papers and a local bus pass. Patient received belongings and verbalized he has received all of his belongings. Patient appropriate and cooperative, Denies SI/HI AVH. Vital signs taken. NAD noted.

## 2018-08-02 NOTE — ED Notes (Signed)
BEHAVIORAL HEALTH ROUNDING Patient sleeping: Yes.   Patient alert and oriented: not applicable SLEEPING Behavior appropriate: Yes.  ; If no, describe: SLEEPING Nutrition and fluids offered: No SLEEPING Toileting and hygiene offered: NoSLEEPING Sitter present: not applicable, Q 15 min safety rounds and observation via security camera. Law enforcement present: Yes ODS 

## 2018-08-02 NOTE — ED Notes (Signed)
Pt up and to the bathroom. Ambulated with steady gait. This RN requested urine sample but pt states he is going to have a BM.

## 2018-08-02 NOTE — ED Notes (Signed)
BEHAVIORAL HEALTH ROUNDING  Patient sleeping: No.  Patient alert and oriented: yes  Behavior appropriate: Yes. ; If no, describe:  Nutrition and fluids offered: Yes  Toileting and hygiene offered: Yes  Sitter present: not applicable, Q 15 min safety rounds and observation via security camera. Law enforcement present: Yes ODS  

## 2018-08-02 NOTE — ED Notes (Addendum)
Cory Foster sister Pura Spice ([phone])) came to visit, informed her that visitation was later, she and I talked and she discussed that she was searching to find Cory Foster a place to live because he was getting evicted from his current home, she also wanted assistance in finding him therapy. Sister appeared to be very concerned and supportive. Sister said she can be contacted all day today, she was concerned because she says this is the worse she has ever seen him (Cory Foster).

## 2018-08-02 NOTE — Discharge Instructions (Signed)
Drink alcohol in moderation, or do not drink at all.  Do not stop drinking cold [place] without talking to your primary care physician.  Return to the emergency department if you develop severe pain, lightheadedness or fainting, seizures, thoughts of hurting yourself or anyone else, hallucinations, or any other symptoms concerning to you.

## 2018-08-02 NOTE — ED Notes (Signed)
BEHAVIORAL HEALTH ROUNDING Patient sleeping: Yes.   Patient alert and oriented: not applicable SLEEPING Behavior appropriate: Yes.  ; If no, describe: SLEEPING Nutrition and fluids offered: No SLEEPING Toileting and hygiene offered: NoSLEEPING Sitter present: not applicable, Q 15 min safety rounds and observation. Law enforcement present: Yes ODS 

## 2018-08-02 NOTE — ED Notes (Signed)
Pt requesting klonopin. Dr Lenard Lance informed and does not wish to order. This RN also requested orders for home meds and CIWA protocol. Verbal order received for CIWA.

## 2018-10-07 ENCOUNTER — Encounter (HOSPITAL_COMMUNITY): Payer: Self-pay

## 2018-10-07 ENCOUNTER — Emergency Department (HOSPITAL_COMMUNITY)
Admission: EM | Admit: 2018-10-07 | Discharge: 2018-10-07 | Disposition: A | Discharge status: Home / Self Care | Payer: Medicare PPO | Attending: Emergency Medicine | Admitting: Emergency Medicine

## 2018-10-07 DIAGNOSIS — Z79899 Other long term (current) drug therapy: Secondary | ICD-10-CM | POA: Diagnosis not present

## 2018-10-07 DIAGNOSIS — F10920 Alcohol use, unspecified with intoxication, uncomplicated: Secondary | ICD-10-CM

## 2018-10-07 DIAGNOSIS — F17210 Nicotine dependence, cigarettes, uncomplicated: Secondary | ICD-10-CM | POA: Insufficient documentation

## 2018-10-07 DIAGNOSIS — E119 Type 2 diabetes mellitus without complications: Secondary | ICD-10-CM | POA: Insufficient documentation

## 2018-10-07 DIAGNOSIS — Z794 Long term (current) use of insulin: Secondary | ICD-10-CM | POA: Insufficient documentation

## 2018-10-07 DIAGNOSIS — I10 Essential (primary) hypertension: Secondary | ICD-10-CM | POA: Diagnosis not present

## 2018-10-07 NOTE — ED Triage Notes (Signed)
Pt intoxicated has been drinking "a few" 16oz of beer.  Also reports he is here to get his sisters phone number.
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
## 2018-10-07 NOTE — ED Notes (Signed)
Taxi vocher given by social work to transport back home to [place]

## 2018-10-07 NOTE — ED Notes (Signed)
Pt stable, ambulatory, states understanding of discharge instructions 

## 2018-10-07 NOTE — ED Provider Notes (Signed)
[facility] EMERGENCY DEPARTMENT Provider Note   CSN: [id number] Arrival date & time: 10/07/18  1408     History   Chief Complaint Chief Complaint  Patient presents with  . Alcohol Intoxication    HPI Cory Foster. is a 63 y.o. male with a past medical history of substance abuse, hypertension, anxiety, depression presents to ED requesting to use a phone.  Patient states that he drank 5 beers between last night and this morning.  He does not drink daily.  He is staying in a motel with his son as they are trying to find a place to live.  States that he got angry with his son, is wanting his sister to come pick him up.  He does not have a phone.  He denies any pain, SI, AVH.  HPI  Past Medical History:  Diagnosis Date  . Anxiety   . DDD (degenerative disc disease)   . Depression   . GERD (gastroesophageal reflux disease)   . Hyperlipidemia   . Hypertension   . Psoriasis   . Substance abuse (HCC)   . Substance induced mood disorder (HCC)   . Varicose vein     Patient Active Problem List   Diagnosis Date Noted  . Substance induced mood disorder (HCC) 08/02/2018  . Acute respiratory failure with hypercapnia (HCC) 01/01/2018  . Severe recurrent major depression without psychotic features (HCC) 12/14/2017  . Diabetes (HCC) 12/14/2017  . Hypertension   . Hyperlipidemia   . GERD (gastroesophageal reflux disease)   . Depression   . Substance abuse (HCC)   . Anxiety   . DDD (degenerative disc disease)   . Psoriasis   . Alcohol dependence (HCC) 08/08/2013  . MDD (major depressive disorder) 08/08/2013  . GAD (generalized anxiety disorder) 08/08/2013    Past Surgical History:  Procedure Laterality Date  . CHOLECYSTECTOMY    . SINUSOTOMY    . VEIN LIGATION AND STRIPPING          Home Medications    Prior to Admission medications   Medication Sig Start Date End Date Taking? Authorizing Provider  atorvastatin (LIPITOR) 80 MG tablet Take 80  mg by mouth daily.     [provider]  clonazePAM (KLONOPIN) 2 MG tablet Take 1 tablet (2 mg total) by mouth 2 (two) times daily for 2 days. 01/16/18 03/07/18  Richarda Overlie, MD  cyclobenzaprine (FLEXERIL) 5 MG tablet Take 1 tablet (5 mg total) by mouth 3 (three) times daily as needed for muscle spasms. Patient not taking: Reported on 03/07/2018 01/16/18   Richarda Overlie, MD  docusate sodium (COLACE) 100 MG capsule Take 1 capsule (100 mg total) by mouth 2 (two) times daily. Patient not taking: Reported on 03/07/2018 01/16/18   Richarda Overlie, MD  folic acid (FOLVITE) 1 MG tablet Take 1 tablet (1 mg total) by mouth daily. Patient not taking: Reported on 03/07/2018 01/17/18   Richarda Overlie, MD  insulin glargine (LANTUS) 100 UNIT/ML injection Inject 0.1 mLs (10 Units total) into the skin at bedtime. Patient taking differently: Inject 16 Units into the skin at bedtime as needed (BS).  01/16/18   Richarda Overlie, MD  metFORMIN (GLUCOPHAGE) 500 MG tablet Take 1,000 mg by mouth 2 (two) times daily with a meal.     [provider]  nicotine (NICODERM CQ - DOSED IN MG/24 HOURS) 21 mg/24hr patch Place 1 patch (21 mg total) onto the skin daily. Patient not taking: Reported on 03/07/2018 01/17/18  Richarda Overlie, MD  protein supplement shake (PREMIER PROTEIN) LIQD Take 325 mLs (11 oz total) by mouth daily. Patient not taking: Reported on 03/07/2018 01/16/18   Richarda Overlie, MD  sertraline (ZOLOFT) 100 MG tablet Take 1 tablet (100 mg total) by mouth 2 (two) times daily. For depression/anxiety 08/15/13   Rachael Fee, MD  thiamine 100 MG tablet Take 1 tablet (100 mg total) by mouth daily. Patient not taking: Reported on 03/07/2018 01/17/18   Richarda Overlie, MD  vitamin B-12 (CYANOCOBALAMIN) 1000 MCG tablet Take 1,000 mcg by mouth daily.    [provider]    Family History Family History  Problem Relation Age of Onset  . Depression Mother   . Anxiety disorder Mother   . Cancer Father      Social History Social History   Tobacco Use  . Smoking status: Current Every Day Smoker    Packs/day: 0.50    Types: Cigarettes  . Smokeless tobacco: Never Used  Substance Use Topics  . Alcohol use: Yes    Comment: daily  . Drug use: No     Allergies   Ceclor [cefaclor]; Ciprofloxacin; Citalopram; and Seroquel [quetiapine fumarate]   Review of Systems Review of Systems  Constitutional: Negative for chills and fever.  Gastrointestinal: Negative for vomiting.  Neurological: Negative for weakness.     Physical Exam Updated Vital Signs BP 126/78   Pulse 97   Temp 98.8 F (37.1 C) (Oral)   Resp 16   SpO2 100%   Physical Exam Vitals signs and nursing note reviewed.  Constitutional:      General: He is not in acute distress.    Appearance: He is well-developed. He is not diaphoretic.     Comments: Slurring words.  NAD. Normal gait. No tremors.  HENT:     Head: Normocephalic and atraumatic.  Eyes:     General: No scleral icterus.    Conjunctiva/sclera: Conjunctivae normal.  Neck:     Musculoskeletal: Normal range of motion.  Cardiovascular:     Rate and Rhythm: Normal rate and regular rhythm.     Heart sounds: Normal heart sounds.  Pulmonary:     Effort: Pulmonary effort is normal. No respiratory distress.     Breath sounds: Normal breath sounds.  Skin:    Findings: No rash.  Neurological:     Mental Status: He is alert.     Cranial Nerves: No cranial nerve deficit.      ED Treatments / Results  Labs (all labs ordered are listed, but only abnormal results are displayed) Labs Reviewed - No data to display  EKG None  Radiology No results found.  Procedures Procedures (including critical care time)  Medications Ordered in ED Medications - No data to display   Initial Impression / Assessment and Plan / ED Course  I have reviewed the triage vital signs and the nursing notes.  Pertinent labs & imaging results that were available during my  care of the patient were reviewed by me and considered in my medical decision making (see chart for details).    63 year old male presents to ED requesting to use a phone.  States that he drank 5 beers in between last night and this morning.  He is angry with his son, whom he is staying with in a motel as a try to find a place to live.  He states that "I am trying to get away from him, that is why I want my sister to come pick  me up."  He denies any injuries, falls, pain, SI, HI, AVH. We will have patient attempt to call his sister. He appears disheveled but NAD. Able to ambulate here without difficulty, several to and from his room to nurse desk. I was able to find sister's phone number in patient's emergency contact list. He tried to call his sister, however her voice mailbox is full and she is not answering her phone. He is requesting that we look up a friend of his to find their phone number. I told patient we are not allowed to do this.  I will consult social work to assist with transportation. I see no further workup indicated at this time.  Patient is hemodynamically stable, in NAD, and able to ambulate in the ED. Evaluation does not show pathology that would require ongoing emergent intervention or inpatient treatment. I explained the diagnosis to the patient. Pain has been managed and has no complaints prior to discharge. Patient is comfortable with above plan and is stable for discharge at this time. All questions were answered prior to disposition. Strict return precautions for returning to the ED were discussed. Encouraged follow up with PCP.    Portions of this note were generated with Scientist, clinical (histocompatibility and immunogenetics)Dragon dictation software. Dictation errors may occur despite best attempts at proofreading.  Final Clinical Impressions(s) / ED Diagnoses   Final diagnoses:  Alcoholic intoxication without complication [facility](HCC)    ED Discharge Orders    None       Dietrich PatesKhatri, Janete Quilling, PA-C 10/07/18 1448    Terrilee FilesButler, Michael  C, MD 10/07/18 [phone]

## 2018-10-13 ENCOUNTER — Emergency Department
Admission: EM | Admit: 2018-10-13 | Discharge: 2018-10-14 | Disposition: A | Discharge status: Other Acute Inpatient Hospital | Payer: Medicare PPO | Attending: Emergency Medicine | Admitting: Emergency Medicine

## 2018-10-13 ENCOUNTER — Other Ambulatory Visit: Payer: Self-pay

## 2018-10-13 DIAGNOSIS — E119 Type 2 diabetes mellitus without complications: Secondary | ICD-10-CM | POA: Insufficient documentation

## 2018-10-13 DIAGNOSIS — Z79899 Other long term (current) drug therapy: Secondary | ICD-10-CM | POA: Insufficient documentation

## 2018-10-13 DIAGNOSIS — F17210 Nicotine dependence, cigarettes, uncomplicated: Secondary | ICD-10-CM | POA: Diagnosis not present

## 2018-10-13 DIAGNOSIS — R45851 Suicidal ideations: Secondary | ICD-10-CM

## 2018-10-13 DIAGNOSIS — Z7984 Long term (current) use of oral hypoglycemic drugs: Secondary | ICD-10-CM | POA: Insufficient documentation

## 2018-10-13 DIAGNOSIS — Z760 Encounter for issue of repeat prescription: Secondary | ICD-10-CM | POA: Diagnosis not present

## 2018-10-13 DIAGNOSIS — F1010 Alcohol abuse, uncomplicated: Secondary | ICD-10-CM | POA: Diagnosis not present

## 2018-10-13 DIAGNOSIS — F329 Major depressive disorder, single episode, unspecified: Secondary | ICD-10-CM | POA: Diagnosis present

## 2018-10-13 DIAGNOSIS — I10 Essential (primary) hypertension: Secondary | ICD-10-CM | POA: Diagnosis not present
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
## 2018-10-13 NOTE — ED Triage Notes (Signed)
Pt is here for medication refill. Pt states he is out of his lantus, metformin and "anxiety and depression" medication. FSBS per ems was 87. Pt in no acute distress.

## 2018-10-14 ENCOUNTER — Encounter: Payer: Self-pay | Admitting: *Deleted

## 2018-10-14 ENCOUNTER — Inpatient Hospital Stay
Admission: AD | Admit: 2018-10-14 | Discharge: 2018-10-28 | DRG: 897 | Disposition: A | Discharge status: Home / Self Care | Payer: Medicare PPO | Source: Intra-hospital | Attending: Family Medicine | Admitting: Family Medicine

## 2018-10-14 ENCOUNTER — Other Ambulatory Visit: Payer: Self-pay

## 2018-10-14 DIAGNOSIS — X58XXXA Exposure to other specified factors, initial encounter: Secondary | ICD-10-CM | POA: Diagnosis present

## 2018-10-14 DIAGNOSIS — R45851 Suicidal ideations: Secondary | ICD-10-CM | POA: Diagnosis present

## 2018-10-14 DIAGNOSIS — Z79899 Other long term (current) drug therapy: Secondary | ICD-10-CM

## 2018-10-14 DIAGNOSIS — F329 Major depressive disorder, single episode, unspecified: Secondary | ICD-10-CM | POA: Diagnosis present

## 2018-10-14 DIAGNOSIS — E119 Type 2 diabetes mellitus without complications: Secondary | ICD-10-CM | POA: Diagnosis present

## 2018-10-14 DIAGNOSIS — F10239 Alcohol dependence with withdrawal, unspecified: Principal | ICD-10-CM | POA: Diagnosis present

## 2018-10-14 DIAGNOSIS — Z9049 Acquired absence of other specified parts of digestive tract: Secondary | ICD-10-CM | POA: Diagnosis not present

## 2018-10-14 DIAGNOSIS — G4700 Insomnia, unspecified: Secondary | ICD-10-CM | POA: Diagnosis present

## 2018-10-14 DIAGNOSIS — Z818 Family history of other mental and behavioral disorders: Secondary | ICD-10-CM | POA: Diagnosis not present

## 2018-10-14 DIAGNOSIS — F332 Major depressive disorder, recurrent severe without psychotic features: Secondary | ICD-10-CM | POA: Diagnosis present

## 2018-10-14 DIAGNOSIS — I8390 Asymptomatic varicose veins of unspecified lower extremity: Secondary | ICD-10-CM | POA: Diagnosis present

## 2018-10-14 DIAGNOSIS — Z881 Allergy status to other antibiotic agents status: Secondary | ICD-10-CM | POA: Diagnosis not present

## 2018-10-14 DIAGNOSIS — E785 Hyperlipidemia, unspecified: Secondary | ICD-10-CM | POA: Diagnosis present

## 2018-10-14 DIAGNOSIS — T383X6A Underdosing of insulin and oral hypoglycemic [antidiabetic] drugs, initial encounter: Secondary | ICD-10-CM | POA: Diagnosis present

## 2018-10-14 DIAGNOSIS — Z590 Homelessness: Secondary | ICD-10-CM

## 2018-10-14 DIAGNOSIS — Z765 Malingerer [conscious simulation]: Secondary | ICD-10-CM | POA: Diagnosis not present

## 2018-10-14 DIAGNOSIS — Z794 Long term (current) use of insulin: Secondary | ICD-10-CM

## 2018-10-14 DIAGNOSIS — F411 Generalized anxiety disorder: Secondary | ICD-10-CM | POA: Diagnosis present

## 2018-10-14 DIAGNOSIS — F1020 Alcohol dependence, uncomplicated: Secondary | ICD-10-CM | POA: Diagnosis present

## 2018-10-14 DIAGNOSIS — F419 Anxiety disorder, unspecified: Secondary | ICD-10-CM | POA: Diagnosis present

## 2018-10-14 DIAGNOSIS — R059 Cough, unspecified: Secondary | ICD-10-CM

## 2018-10-14 DIAGNOSIS — I959 Hypotension, unspecified: Secondary | ICD-10-CM | POA: Diagnosis present

## 2018-10-14 DIAGNOSIS — K219 Gastro-esophageal reflux disease without esophagitis: Secondary | ICD-10-CM | POA: Diagnosis present

## 2018-10-14 DIAGNOSIS — E1122 Type 2 diabetes mellitus with diabetic chronic kidney disease: Secondary | ICD-10-CM

## 2018-10-14 DIAGNOSIS — E782 Mixed hyperlipidemia: Secondary | ICD-10-CM | POA: Diagnosis present

## 2018-10-14 DIAGNOSIS — M25569 Pain in unspecified knee: Secondary | ICD-10-CM | POA: Diagnosis present

## 2018-10-14 DIAGNOSIS — N181 Chronic kidney disease, stage 1: Secondary | ICD-10-CM

## 2018-10-14 DIAGNOSIS — R05 Cough: Secondary | ICD-10-CM

## 2018-10-14 DIAGNOSIS — I10 Essential (primary) hypertension: Secondary | ICD-10-CM | POA: Diagnosis present

## 2018-10-14 DIAGNOSIS — F17210 Nicotine dependence, cigarettes, uncomplicated: Secondary | ICD-10-CM | POA: Diagnosis present

## 2018-10-14 DIAGNOSIS — F32A Depression, unspecified: Secondary | ICD-10-CM | POA: Diagnosis present

## 2018-10-14 DIAGNOSIS — Z888 Allergy status to other drugs, medicaments and biological substances status: Secondary | ICD-10-CM

## 2018-10-14 LAB — COMPREHENSIVE METABOLIC PANEL
ALK PHOS: 70 U/L (ref 38–126)
ALT: 58 U/L — AB (ref 0–44)
AST: 35 U/L (ref 15–41)
Albumin: 3.6 g/dL (ref 3.5–5.0)
Anion gap: 5 (ref 5–15)
BUN: 15 mg/dL (ref 8–23)
CALCIUM: 8.4 mg/dL — AB (ref 8.9–10.3)
CO2: 30 mmol/L (ref 22–32)
Chloride: 104 mmol/L (ref 98–111)
Creatinine, Ser: 1.1 mg/dL (ref 0.61–1.24)
GFR calc non Af Amer: >60 mL/min (ref >60)
GLUCOSE: 106 mg/dL — AB (ref 70–99)
Potassium: 3.4 mmol/L — ABNORMAL LOW (ref 3.5–5.1)
SODIUM: 139 mmol/L (ref 135–145)
Total Bilirubin: 1.1 mg/dL (ref 0.3–1.2)
Total Protein: 7.2 g/dL (ref 6.5–8.1)

## 2018-10-14 LAB — CBC WITH DIFFERENTIAL/PLATELET
ABS IMMATURE GRANULOCYTES: 0.02 10*3/uL (ref 0.00–0.07)
BASOS ABS: 0 10*3/uL (ref 0.0–0.1)
BASOS PCT: 1 %
Eosinophils Absolute: 0.2 10*3/uL (ref 0.0–0.5)
Eosinophils Relative: 3 %
HCT: 37.3 % — ABNORMAL LOW (ref 39.0–52.0)
HEMOGLOBIN: 12.7 g/dL — AB (ref 13.0–17.0)
IMMATURE GRANULOCYTES: 0 %
LYMPHS ABS: 1.5 10*3/uL (ref 0.7–4.0)
Lymphocytes Relative: 31 %
MCH: 33.5 pg (ref 26.0–34.0)
MCHC: 34 g/dL (ref 30.0–36.0)
MCV: 98.4 fL (ref 80.0–100.0)
MONOS PCT: 9 %
Monocytes Absolute: 0.4 10*3/uL (ref 0.1–1.0)
NRBC: 0 % (ref 0.0–0.2)
Neutro Abs: 2.7 10*3/uL (ref 1.7–7.7)
Neutrophils Relative %: 56 %
PLATELETS: 85 10*3/uL — AB (ref 150–400)
RBC: 3.79 MIL/uL — AB (ref 4.22–5.81)
RDW: 14.6 % (ref 11.5–15.5)
WBC: 4.8 10*3/uL (ref 4.0–10.5)

## 2018-10-14 LAB — GLUCOSE, CAPILLARY: Glucose-Capillary: 83 mg/dL (ref 70–99)

## 2018-10-14 LAB — ETHANOL

## 2018-10-14 LAB — SALICYLATE LEVEL

## 2018-10-14 LAB — ACETAMINOPHEN LEVEL

## 2018-10-14 MED ORDER — CHLORDIAZEPOXIDE HCL 25 MG PO CAPS
25.0000 mg | ORAL_CAPSULE | ORAL | Status: AC
  Administered 2018-10-16 – 2018-10-17 (×2): 25 mg via ORAL
  Filled 2018-10-14 (×2): qty 1

## 2018-10-14 MED ORDER — VITAMIN B-1 100 MG PO TABS
100.0000 mg | ORAL_TABLET | Freq: Every day | ORAL | Status: DC
  Administered 2018-10-14: 100 mg via ORAL
  Filled 2018-10-14: qty 1

## 2018-10-14 MED ORDER — METFORMIN HCL 500 MG PO TABS
1000.0000 mg | ORAL_TABLET | Freq: Two times a day (BID) | ORAL | Status: DC
  Administered 2018-10-14 – 2018-10-28 (×28): 1000 mg via ORAL
  Filled 2018-10-14 (×28): qty 2

## 2018-10-14 MED ORDER — SERTRALINE HCL 100 MG PO TABS
100.0000 mg | ORAL_TABLET | Freq: Two times a day (BID) | ORAL | Status: DC
  Administered 2018-10-14 – 2018-10-17 (×6): 100 mg via ORAL
  Filled 2018-10-14 (×6): qty 1

## 2018-10-14 MED ORDER — ACETAMINOPHEN 325 MG PO TABS
650.0000 mg | ORAL_TABLET | Freq: Four times a day (QID) | ORAL | Status: DC | PRN
  Administered 2018-10-15 – 2018-10-25 (×8): 650 mg via ORAL
  Filled 2018-10-14 (×8): qty 2

## 2018-10-14 MED ORDER — ATORVASTATIN CALCIUM 20 MG PO TABS: 80.0000 mg | ORAL_TABLET | Freq: Every day | ORAL | Status: DC

## 2018-10-14 MED ORDER — SERTRALINE HCL 50 MG PO TABS
100.0000 mg | ORAL_TABLET | Freq: Two times a day (BID) | ORAL | Status: DC
  Administered 2018-10-14 (×2): 100 mg via ORAL
  Filled 2018-10-14 (×2): qty 2

## 2018-10-14 MED ORDER — CHLORDIAZEPOXIDE HCL 25 MG PO CAPS
25.0000 mg | ORAL_CAPSULE | Freq: Four times a day (QID) | ORAL | Status: AC
  Administered 2018-10-14 – 2018-10-15 (×4): 25 mg via ORAL
  Filled 2018-10-14 (×4): qty 1

## 2018-10-14 MED ORDER — CHLORDIAZEPOXIDE HCL 25 MG PO CAPS
25.0000 mg | ORAL_CAPSULE | Freq: Three times a day (TID) | ORAL | Status: AC
  Administered 2018-10-15 – 2018-10-16 (×3): 25 mg via ORAL
  Filled 2018-10-14 (×3): qty 1

## 2018-10-14 MED ORDER — INSULIN GLARGINE 100 UNIT/ML ~~LOC~~ SOLN
16.0000 [IU] | Freq: Every evening | SUBCUTANEOUS | Status: DC | PRN
  Filled 2018-10-14: qty 0.16

## 2018-10-14 MED ORDER — VITAMIN B-1 100 MG PO TABS
100.0000 mg | ORAL_TABLET | Freq: Every day | ORAL | Status: DC
  Administered 2018-10-15 – 2018-10-28 (×14): 100 mg via ORAL
  Filled 2018-10-14 (×14): qty 1

## 2018-10-14 MED ORDER — ATORVASTATIN CALCIUM 20 MG PO TABS
80.0000 mg | ORAL_TABLET | Freq: Every day | ORAL | Status: DC
  Administered 2018-10-14 – 2018-10-27 (×14): 80 mg via ORAL
  Filled 2018-10-14 (×14): qty 4

## 2018-10-14 MED ORDER — INSULIN GLARGINE 100 UNIT/ML ~~LOC~~ SOLN
16.0000 [IU] | Freq: Every day | SUBCUTANEOUS | Status: DC
  Filled 2018-10-14 (×2): qty 0.16

## 2018-10-14 MED ORDER — CHLORDIAZEPOXIDE HCL 25 MG PO CAPS: 25.0000 mg | ORAL_CAPSULE | Freq: Every day | ORAL | Status: DC

## 2018-10-14 MED ORDER — ADULT MULTIVITAMIN W/MINERALS CH
1.0000 | ORAL_TABLET | Freq: Every day | ORAL | Status: DC
  Administered 2018-10-15 – 2018-10-28 (×14): 1 via ORAL
  Filled 2018-10-14 (×14): qty 1

## 2018-10-14 MED ORDER — THIAMINE HCL 100 MG PO TABS: 100.0000 mg | ORAL_TABLET | Freq: Every day | ORAL | Status: DC

## 2018-10-14 MED ORDER — TRAZODONE HCL 50 MG PO TABS
50.0000 mg | ORAL_TABLET | Freq: Every evening | ORAL | Status: DC | PRN
  Administered 2018-10-14 – 2018-10-15 (×2): 50 mg via ORAL
  Filled 2018-10-14 (×2): qty 1

## 2018-10-14 MED ORDER — LOPERAMIDE HCL 2 MG PO CAPS: 2.0000 mg | ORAL_CAPSULE | ORAL | Status: AC | PRN

## 2018-10-14 MED ORDER — CHLORDIAZEPOXIDE HCL 25 MG PO CAPS
100.0000 mg | ORAL_CAPSULE | Freq: Once | ORAL | Status: AC
  Administered 2018-10-14: 100 mg via ORAL
  Filled 2018-10-14: qty 4

## 2018-10-14 MED ORDER — VITAMIN B-1 100 MG PO TABS: 100.0000 mg | ORAL_TABLET | Freq: Every day | ORAL | Status: DC

## 2018-10-14 MED ORDER — ONDANSETRON 4 MG PO TBDP
4.0000 mg | ORAL_TABLET | Freq: Four times a day (QID) | ORAL | Status: AC | PRN
  Administered 2018-10-16 – 2018-10-17 (×2): 4 mg via ORAL
  Filled 2018-10-14 (×2): qty 1

## 2018-10-14 MED ORDER — MAGNESIUM HYDROXIDE 400 MG/5ML PO SUSP
30.0000 mL | Freq: Every day | ORAL | Status: DC | PRN
  Administered 2018-10-17 – 2018-10-25 (×3): 30 mL via ORAL
  Filled 2018-10-14 (×3): qty 30

## 2018-10-14 MED ORDER — FOLIC ACID 1 MG PO TABS
1.0000 mg | ORAL_TABLET | Freq: Every day | ORAL | Status: DC
  Administered 2018-10-14: 1 mg via ORAL
  Filled 2018-10-14: qty 1

## 2018-10-14 MED ORDER — FOLIC ACID 1 MG PO TABS
1.0000 mg | ORAL_TABLET | Freq: Every day | ORAL | Status: DC
  Administered 2018-10-15 – 2018-10-28 (×14): 1 mg via ORAL
  Filled 2018-10-14 (×14): qty 1

## 2018-10-14 MED ORDER — CHLORDIAZEPOXIDE HCL 25 MG PO CAPS
25.0000 mg | ORAL_CAPSULE | Freq: Four times a day (QID) | ORAL | Status: DC | PRN
  Administered 2018-10-14: 25 mg via ORAL

## 2018-10-14 MED ORDER — ALUM & MAG HYDROXIDE-SIMETH 200-200-20 MG/5ML PO SUSP: 30.0000 mL | ORAL | Status: DC | PRN

## 2018-10-14 MED ORDER — HYDROXYZINE HCL 25 MG PO TABS
25.0000 mg | ORAL_TABLET | Freq: Four times a day (QID) | ORAL | Status: DC | PRN
  Administered 2018-10-17: 25 mg via ORAL
  Filled 2018-10-14: qty 1

## 2018-10-14 NOTE — ED Notes (Signed)
Hourly rounding reveals patient in room. No complaints, stable, in no acute distress. Q15 minute rounds and monitoring via [employer] to continue. 
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
## 2018-10-14 NOTE — Tx Team (Signed)
Initial Treatment Plan 10/14/2018 2:19 PM Cory Foster [name]. IPJ:[id number]    PATIENT STRESSORS: Financial difficulties Legal issue Marital or family conflict Medication change or noncompliance   PATIENT STRENGTHS: Capable of independent living Metallurgist fund of knowledge   PATIENT IDENTIFIED PROBLEMS: Suicidal Ideation  Depression                   DISCHARGE CRITERIA:  Improved stabilization in mood, thinking, and/or behavior  PRELIMINARY DISCHARGE PLAN: Outpatient therapy  PATIENT/FAMILY INVOLVEMENT: This treatment plan has been presented to and reviewed with the patient, [name]., and/or family member, .  The patient and family have been given the opportunity to ask questions and make suggestions.  Shelia Media, RN 10/14/2018, 2:19 PM
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
## 2018-10-14 NOTE — BHH Suicide Risk Assessment (Signed)
[facility] Admission Suicide Risk Assessment   Nursing information obtained from:  Patient Demographic factors:  Male, Caucasian Current Mental Status:  Suicidal ideation indicated by patient Loss Factors:  Legal issues, Loss of significant relationship Historical Factors:  NA Risk Reduction Factors:  Positive coping skills or problem solving skills  Total Time spent with patient: 1 hour Principal Problem: Alcohol dependence (HCC) Diagnosis:  Principal Problem:   Alcohol dependence (HCC) Active Problems:   MDD (major depressive disorder)   GAD (generalized anxiety disorder)   Depression  Subjective Data:  See HPI  Continued Clinical Symptoms:    The "Alcohol Use Disorders Identification Test", Guidelines for Use in Primary Care, Second Edition.  World Science writer [facility]). Score between 0-7:  no or low risk or alcohol related problems. Score between 8-15:  moderate risk of alcohol related problems. Score between 16-19:  high risk of alcohol related problems. Score 20 or above:  warrants further diagnostic evaluation for alcohol dependence and treatment.   CLINICAL FACTORS:   Severe Anxiety and/or Agitation Alcohol/Substance Abuse/Dependencies   Musculoskeletal: Strength & Muscle Tone: within normal limits Gait & Station: normal Patient leans: N/A  Psychiatric Specialty Exam: See HPI    COGNITIVE FEATURES THAT CONTRIBUTE TO RISK:  Closed-mindedness    SUICIDE RISK:   Mild:  Suicidal ideation of limited frequency, intensity, duration, and specificity.  There are no identifiable plans, no associated intent, mild dysphoria and related symptoms, good self-control (both objective and subjective assessment), few other risk factors, and identifiable protective factors, including available and accessible social support.  PLAN OF CARE: see HPI  I certify that inpatient services furnished can reasonably be expected to improve the patient's condition.   Alyia Lacerte, MD 10/14/2018,  3:26 PM

## 2018-10-14 NOTE — ED Notes (Signed)
Pt. Dressed into burgundy scrubs.  Pt. Had two pair of jeans, blue t-shirt, pair of shoes, pair of socks, grey jacket, burgundy long underwear shirt, blue sweatshirt and a blue blanket.  Patients belongings placed in two bags.

## 2018-10-14 NOTE — BH Assessment (Signed)
Patient is to be admitted to [facility] by Dr.Smith.  Attending Physician will be Dr. Toni Amend.   Patient has been assigned to room 313, by [facility] Charge Nurse Marylu Lund.   Intake Paper Work has been signed and placed on patient chart.  ER staff is aware of the admission:  French Ana: ER Secretary    Dr. Karl Bales: ER MD   Gigi: Patient's Nurse   Marylene Land: Patient Access.

## 2018-10-14 NOTE — ED Provider Notes (Signed)
-----------------------------------------   4:00 AM on 10/14/2018 -----------------------------------------  Patient was evaluated by [facility] psychiatrist Dr. Hermelinda Medicus who recommends admission to inpatient psychiatry service.  No new medication recommendations.   Irean Hong, MD 10/14/18 [phone]

## 2018-10-14 NOTE — Progress Notes (Signed)
LCSW reviewed patient documentation and SOC recommended inpatient psychiatric stay. TTS will place this patient.    [employer] LCSW  [phone]

## 2018-10-14 NOTE — ED Notes (Signed)
Patient discharged to in patient unit via wheelchair.All belongings returned with patient.

## 2018-10-14 NOTE — Progress Notes (Addendum)
Inpatient Diabetes Program Recommendations  AACE/ADA: New Consensus Statement on Inpatient Glycemic Control  Target Ranges:  Prepandial:   less than 140 mg/dL      Peak postprandial:   less than 180 mg/dL (1-2 hours)      Critically ill patients:  140 - 180 mg/dL   Results for TSUYOSHI, WHARY (MRN [id number]) as of 10/14/2018 15:40  Ref. Range 10/13/2018 23:50  Glucose Latest Ref Range: 70 - 99 mg/dL 573 (H)   Review of Glycemic Control  Diabetes history: DM2 Outpatient Diabetes medications: Lantus 16 units QHS if needed, Metformin 1000 mg BID Current orders for Inpatient glycemic control: Metformin 1000 mg BID, Lantus 16 units QHS  Inpatient Diabetes Program Recommendations:   Insulin - Basal: Would not recommend ordering Lantus at this time. Will follow glucose trends and determine if Lantus is needed or not.  Correction (SSI): Please consider ordering CBGs with Novolog 0-9 units TID with meals and Novolog 0-5 units QHS.  HbgA1C: Please consider ordering an A1C to evaluate glycemic control over the past 2-3 months.  Note: Noted consult for Diabetes Coordinator. Diabetes Coordinator is not on campus over the weekend but available by pager from 8am to 5pm for questions or concerns. Chart reviewed.    Thanks, Orlando Penner, RN, MSN, CDE Diabetes Coordinator Inpatient Diabetes Program [phone] (Team Pager from 8am to 5pm)

## 2018-10-14 NOTE — ED Notes (Signed)
Wenda LowMatt Martin, rn has dressed pt out into scrubs-burgandy.
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
## 2018-10-15 LAB — GLUCOSE, CAPILLARY
GLUCOSE-CAPILLARY: 109 mg/dL — AB (ref 70–99)
Glucose-Capillary: 121 mg/dL — ABNORMAL HIGH (ref 70–99)

## 2018-10-15 LAB — HEMOGLOBIN A1C
Hgb A1c MFr Bld: 5.6 % (ref 4.8–5.6)
Mean Plasma Glucose: 114.02 mg/dL

## 2018-10-15 LAB — TSH: TSH: 1.38 u[IU]/mL (ref 0.350–4.500)

## 2018-10-15 MED ORDER — INSULIN ASPART 100 UNIT/ML ~~LOC~~ SOLN: 0.0000 [IU] | Freq: Three times a day (TID) | SUBCUTANEOUS | Status: DC

## 2018-10-15 MED ORDER — INSULIN ASPART 100 UNIT/ML ~~LOC~~ SOLN
0.0000 [IU] | Freq: Three times a day (TID) | SUBCUTANEOUS | Status: DC
  Administered 2018-10-15 – 2018-10-17 (×3): 1 [IU] via SUBCUTANEOUS
  Administered 2018-10-18 – 2018-10-19 (×2): 2 [IU] via SUBCUTANEOUS
  Administered 2018-10-19 – 2018-10-20 (×3): 1 [IU] via SUBCUTANEOUS
  Administered 2018-10-20: 2 [IU] via SUBCUTANEOUS
  Administered 2018-10-20 – 2018-10-26 (×10): 1 [IU] via SUBCUTANEOUS
  Administered 2018-10-26: 2 [IU] via SUBCUTANEOUS
  Administered 2018-10-27: 1 [IU] via SUBCUTANEOUS
  Administered 2018-10-27: 2 [IU] via SUBCUTANEOUS
  Administered 2018-10-28: 1 [IU] via SUBCUTANEOUS
  Filled 2018-10-15 (×15): qty 1

## 2018-10-15 MED ORDER — INSULIN ASPART 100 UNIT/ML ~~LOC~~ SOLN: 0.0000 [IU] | Freq: Every day | SUBCUTANEOUS | Status: DC

## 2018-10-15 MED ORDER — NICOTINE 21 MG/24HR TD PT24
21.0000 mg | MEDICATED_PATCH | Freq: Every day | TRANSDERMAL | Status: DC
  Administered 2018-10-15 – 2018-10-28 (×14): 21 mg via TRANSDERMAL
  Filled 2018-10-15 (×14): qty 1
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
## 2018-10-15 NOTE — BHH Group Notes (Signed)
LCSW Group Therapy Note 10/15/2018 1:15pm  Type of Therapy and Topic: Group Therapy: Feelings Around Returning Home & Establishing a Supportive Framework and Supporting Oneself When Supports Not Available  Participation Level: Active  Description of Group:  Patients first processed thoughts and feelings about upcoming discharge. These included fears of upcoming changes, lack of change, new living environments, judgements and expectations from others and overall stigma of mental health issues. The group then discussed the definition of a supportive framework, what that looks and feels like, and how do to discern it from an unhealthy non-supportive network. The group identified different types of supports as well as what to do when your family/friends are less than helpful or unavailable  Therapeutic Goals  1. Patient will identify one healthy supportive network that they can use at discharge. 2. Patient will identify one factor of a supportive framework and how to tell it from an unhealthy network. 3. Patient able to identify one coping skill to use when they do not have positive supports from others. 4. Patient will demonstrate ability to communicate their needs through discussion and/or role plays.  Summary of Patient Progress:  The patient reports he feels "like crap." Pt engaged during group session. As patients processed their anxiety about discharge and described healthy supports patient shared he is not ready to be discharge. He states he does not anywhere to go.  Patients identified at least one self-care tool they were willing to use after discharge.   Therapeutic Modalities Cognitive Behavioral Therapy Motivational Interviewing   Cory Foster  CUEBAS-COLON, LCSW 10/15/2018 9:14 AM

## 2018-10-15 NOTE — BHH Group Notes (Signed)
BHH Group Notes:  (Nursing/MHT/Case Management/Adjunct)  Date:  10/15/2018  Time:  9:17 PM  Type of Therapy:  Group Therapy  Participation Level:  Active  Participation Quality:  Sharing  Affect:  Appropriate and Said he cold his hand cold now.  Cognitive:  Alert  Insight:  Good  Engagement in Group:  Supportive  Modes of Intervention:  Support  Summary of Progress/Problems:  Mayra Neer 10/15/2018, 9:17 PM
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
## 2018-10-15 NOTE — Plan of Care (Signed)
Patient verbalizes understanding of the general information that's been provided to him and all questions and concerns have been addressed and answered at this time. Patient rated his depression and anxiety a "10/10" stating that he is "totally depressed" and has "anxiety attacks". Patient denies SI, HI, AVH, at this time stating "not lately, I'll let the Lord take care of it". Patient has been present in the milieu and has interacted well with staff and other members on the unit without any issues thus far. Patient has the ability to make informed decisions and has been in compliance with his prescribed therapeutic regimen. Patient has been free from injury and remains safe on the unit at this time.  Problem: Education: Goal: Knowledge of [facility] Education information/materials will improve Outcome: Progressing Goal: Emotional status will improve Outcome: Progressing Goal: Mental status will improve Outcome: Progressing   Problem: Activity: Goal: Interest or engagement in activities will improve Outcome: Progressing Goal: Sleeping patterns will improve Outcome: Progressing   Problem: Coping: Goal: Ability to verbalize frustrations and anger appropriately will improve Outcome: Progressing Goal: Ability to demonstrate self-control will improve Outcome: Progressing   Problem: Safety: Goal: Periods of time without injury will increase Outcome: Progressing   Problem: Education: Goal: Ability to make informed decisions regarding treatment will improve Outcome: Progressing   Problem: Coping: Goal: Coping ability will improve Outcome: Progressing   Problem: Health Behavior/Discharge Planning: Goal: Identification of resources available to assist in meeting health care needs will improve Outcome: Progressing   Problem: Medication: Goal: Compliance with prescribed medication regimen will improve Outcome: Progressing   Problem: Self-Concept: Goal: Ability to disclose and  discuss suicidal ideas will improve Outcome: Progressing Goal: Will verbalize positive feelings about self Outcome: Progressing   Problem: Education: Goal: Utilization of techniques to improve thought processes will improve Outcome: Progressing   Problem: Coping: Goal: Coping ability will improve Outcome: Progressing

## 2018-10-15 NOTE — Progress Notes (Signed)
D- Patient alert and oriented. Patient presents in a pleasant mood on assessment stating that he slept "so-so" last night, but had some complaints of pain in his hips, rating his pain level a "7/10", and he did request pain medication from this Clinical research associatewriter. Patient rated his depression and anxiety a "10/10" stating that he is "totally depressed" and has "anxiety attacks". Patient denies SI, HI, AVH, at this time stating "not lately, I'll let the Lord take care of it". Patient's goal for today is to "work on my not drinking, how to stay sober, and getting on Clonazepam".  A- Scheduled medications administered to patient, per MD orders. Support and encouragement provided.  Routine safety checks conducted every 15 minutes.  Patient informed to notify staff with problems or concerns.  R- No adverse drug reactions noted. Patient contracts for safety at this time. Patient compliant with medications and treatment plan. Patient receptive, calm, and cooperative. Patient interacts well with others on the unit.  Patient remains safe at this time.

## 2018-10-15 NOTE — Progress Notes (Signed)
Inpatient Diabetes Program Recommendations  AACE/ADA: New Consensus Statement on Inpatient Glycemic Control  Target Ranges:  Prepandial:   less than 140 mg/dL      Peak postprandial:   less than 180 mg/dL (1-2 hours)      Critically ill patients:  140 - 180 mg/dL  Results for MEARL, KELLOM (MRN [id number]) as of 10/15/2018 08:36  Ref. Range 10/14/2018 21:27  Glucose-Capillary Latest Ref Range: 70 - 99 mg/dL 83   Results for KEETON, GARGES (MRN [id number]) as of 10/14/2018 15:40  Ref. Range 10/13/2018 23:50  Glucose Latest Ref Range: 70 - 99 mg/dL 643 (H)   Review of Glycemic Control  Diabetes history: DM2 Outpatient Diabetes medications: Lantus 16 units QHS if needed, Metformin 1000 mg BID Current orders for Inpatient glycemic control: Metformin 1000 mg BID, Lantus 16 units QHS  Inpatient Diabetes Program Recommendations:   Insulin -  Basal:Patient did NOT receive Lantus last night.   Would not recommend ordering Lantus at this time. Will follow glucose trends and determine if Lantus is needed or not.  Correction (SSI): Please consider ordering CBGs with Novolog 0-9 units TID with meals and Novolog 0-5 units QHS.  HbgA1C: Please consider ordering an A1C to evaluate glycemic control over the past 2-3 months.  Note: Noted consult for Diabetes Coordinator. Diabetes Coordinator is not on campus over the weekend but available by pager from 8am to 5pm for questions or concerns. Chart reviewed.    Thanks, Orlando Penner, RN, MSN, CDE Diabetes Coordinator Inpatient Diabetes Program [phone] (Team Pager from 8am to 5pm)

## 2018-10-16 DIAGNOSIS — F1020 Alcohol dependence, uncomplicated: Secondary | ICD-10-CM

## 2018-10-16 LAB — GLUCOSE, CAPILLARY
Glucose-Capillary: 149 mg/dL — ABNORMAL HIGH (ref 70–99)
Glucose-Capillary: 113 mg/dL — ABNORMAL HIGH (ref 70–99)
Glucose-Capillary: 102 mg/dL — ABNORMAL HIGH (ref 70–99)
Glucose-Capillary: 151 mg/dL — ABNORMAL HIGH (ref 70–99)

## 2018-10-16 MED ORDER — TRAZODONE HCL 50 MG PO TABS
150.0000 mg | ORAL_TABLET | Freq: Every day | ORAL | Status: DC
  Administered 2018-10-16: 150 mg via ORAL
  Filled 2018-10-16: qty 1
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
## 2018-10-16 NOTE — Progress Notes (Signed)
Recreation Therapy Notes  INPATIENT RECREATION THERAPY ASSESSMENT  Patient Details Name: Cory AxonRandall McDougal Shatswell Jr. MRN: [id number] DOB: April 05, 1956 Today's Date: 10/16/2018       Information Obtained From: Patient  Able to Participate in Assessment/Interview: Yes  Patient Presentation: Responsive  Reason for Admission (Per Patient): Active Symptoms  Patient Stressors:    Coping Skills:   Substance Abuse, Sports  Leisure Interests (2+):  Sports - Swimming, Individual - TV, Sports - Golf, Sports - Basketball  Frequency of Recreation/Participation: Pharmacist, communityMonthly  Awareness of Community Resources:  Yes  Community Resources:  Library, Other (Comment)([facility])  Current Use:    If no, Barriers?:    Expressed Interest in [employer] Street CorporationCommunity Resource Information:    [place] of [place]idence:  [place]  Patient Main Form of Transportation: Walk  Patient Strengths:  helpful, giving  Patient Identified Areas of Improvement:  Quit drinking  Patient Goal for Hospitalization:  Detox and check on my blood pressure  Current SI (including self-harm):  Yes(I am suicidal everyday)  Current HI:  No  Current AVH: No  Staff Intervention Plan: Group Attendance, Collaborate with Interdisciplinary Treatment Team  Consent to Intern Participation: N/A  Huntleigh Doolen 10/16/2018, 2:48 PM

## 2018-10-16 NOTE — [facility] Group Notes (Signed)
Overcoming Obstacles  10/16/2018 1PM  Type of Therapy and Topic:  Group Therapy:  Overcoming Obstacles  Participation Level:  Did Not Attend    Description of Group:    In this group patients will be encouraged to explore what they see as obstacles to their own wellness and recovery. They will be guided to discuss their thoughts, feelings, and behaviors related to these obstacles. The group will process together ways to cope with barriers, with attention given to specific choices patients can make. Each patient will be challenged to identify changes they are motivated to make in order to overcome their obstacles. This group will be process-oriented, with patients participating in exploration of their own experiences as well as giving and receiving support and challenge from other group members.   Therapeutic Goals: 1. Patient will identify personal and current obstacles as they relate to admission. 2. Patient will identify barriers that currently interfere with their wellness or overcoming obstacles.  3. Patient will identify feelings, thought process and behaviors related to these barriers. 4. Patient will identify two changes they are willing to make to overcome these obstacles:      Summary of Patient Progress     Therapeutic Modalities:   Cognitive Behavioral Therapy Solution Focused Therapy Motivational Interviewing Relapse Prevention Therapy    Lowella Dandyarren Atom Solivan, MSW, LCSW 10/16/2018 2:21 PM

## 2018-10-16 NOTE — Progress Notes (Signed)
Patient is adjusting well in the unit, takes his medications , participate in group scheduled activities and maintaining safety no falls , behavior is appropriate , socialize well with peers, contract for safety of self and others , voice no concerns at this time, appetite is good no distress, 15 minute safety checks is in progress.

## 2018-10-16 NOTE — Progress Notes (Signed)
Pastoral Care Visit   10/16/18 1545  Clinical Encounter Type  Visited With Patient  Visit Type Initial  Referral From Nurse  Consult/Referral To Chaplain  Recommendations Ongoing support for housing and sobriety  Spiritual Encounters  Spiritual Needs Prayer  Stress Factors  Patient Stress Factors Exhausted;Financial concerns;Family relationships;Loss of control   Pt shared his struggles with alcohol and klonopin abuse.  He expressed that he is struggling with a depressed mood and that his desire to get sober and to be able to be get back on his feet financially. He is thankful for the support and care he has been receiving here at the hospital. He is especially thankful for the support to secure housing from the Child psychotherapist.  Pt shared his personal story. Chap listened to story, encouraged him to keep moving forward in his sobriety, and prayed with pt.  Milinda Antis, [address]

## 2018-10-16 NOTE — Progress Notes (Signed)
Recreation Therapy Notes  Date: 10/16/2018  Time: 9:30 am  Location: Craft Room  Behavioral response: Appropriate  Intervention Topic: Self- care  Discussion/Intervention:  Group content today was focused on Self-Care. The group defined self-care and some positive ways they care for themselves. Individuals expressed ways and reasons why they neglected any self-care in the past. Patients described ways to improve self-care in the future. The group explained what could happen if they did not do any self-care activities at all. The group participated in the intervention "self-care assessment" where they had a chance to discover some of their weaknesses and strengths in self- care. Patient came up with a self-care plan to improve themselves in the future.  Clinical Observations/Feedback:  Patient came to group and stated a way he could improve his self-care is to look out for himself and not use alcohol. Individual was social with peers and staff while participating in the intervention. Merwyn Hodapp LRT/CTRS           Desere Gwin 10/16/2018 10:58 AM

## 2018-10-16 NOTE — Plan of Care (Signed)
Patient stated that he could sleep good last night.Rated his depression 7/10 and hopelessness 10/10.Patient c/o nausea.PRN med given.Encouraged fluids.Denies SI,HI and AVH.Appetite and energy level good.Compliant with medications.Support and encouragement given.

## 2018-10-16 NOTE — Tx Team (Addendum)
Interdisciplinary Treatment and Diagnostic Plan Update  10/16/2018 Time of Session: 8:30AM Cory Foster Cory Foster. MRN: [id number]  Principal Diagnosis: Alcohol dependence (HCC)  Secondary Diagnoses: Principal Problem:   Alcohol dependence (HCC) Active Problems:   MDD (major depressive disorder)   GAD (generalized anxiety disorder)   Depression   Current Medications:  Current Facility-Administered Medications  Medication Dose Route Frequency Provider Last Rate Last Dose  . acetaminophen (TYLENOL) tablet 650 mg  650 mg Oral Q6H PRN He, Jun, MD   650 mg at 10/15/18 1449  . alum & mag hydroxide-simeth (MAALOX/MYLANTA) 200-200-20 MG/5ML suspension 30 mL  30 mL Oral Q4H PRN He, Jun, MD      . atorvastatin (LIPITOR) tablet 80 mg  80 mg Oral Daily He, Jun, MD   80 mg at 10/15/18 1710  . chlordiazePOXIDE (LIBRIUM) capsule 25 mg  25 mg Oral Q6H PRN He, Jun, MD   25 mg at 10/14/18 2124  . chlordiazePOXIDE (LIBRIUM) capsule 25 mg  25 mg Oral BH-qamhs He, Jun, MD       Followed by  . [START ON 10/18/2018] chlordiazePOXIDE (LIBRIUM) capsule 25 mg  25 mg Oral Daily He, Jun, MD      . folic acid (FOLVITE) tablet 1 mg  1 mg Oral Daily He, Jun, MD   1 mg at 10/16/18 0844  . hydrOXYzine (ATARAX/VISTARIL) tablet 25 mg  25 mg Oral Q6H PRN He, Jun, MD      . insulin aspart (novoLOG) injection 0-5 Units  0-5 Units Subcutaneous QHS He, Jun, MD      . insulin aspart (novoLOG) injection 0-9 Units  0-9 Units Subcutaneous TID WC He, Jun, MD   1 Units at 10/16/18 0802  . loperamide (IMODIUM) capsule 2-4 mg  2-4 mg Oral PRN He, Jun, MD      . magnesium hydroxide (MILK OF MAGNESIA) suspension 30 mL  30 mL Oral Daily PRN He, Jun, MD      . metFORMIN (GLUCOPHAGE) tablet 1,000 mg  1,000 mg Oral BID WC He, Jun, MD   1,000 mg at 10/16/18 0843  . multivitamin with minerals tablet 1 tablet  1 tablet Oral Daily He, Jun, MD   1 tablet at 10/16/18 0900  . nicotine (NICODERM CQ - dosed in mg/24 hours) patch 21 mg  21 mg  Transdermal Daily He, Jun, MD   21 mg at 10/16/18 0802  . ondansetron (ZOFRAN-ODT) disintegrating tablet 4 mg  4 mg Oral Q6H PRN He, Jun, MD   4 mg at 10/16/18 1133  . sertraline (ZOLOFT) tablet 100 mg  100 mg Oral BID He, Jun, MD   100 mg at 10/16/18 0904  . thiamine (VITAMIN B-1) tablet 100 mg  100 mg Oral Daily He, Jun, MD   100 mg at 10/16/18 0843  . traZODone (DESYREL) tablet 50 mg  50 mg Oral QHS PRN He, Jun, MD   50 mg at 10/15/18 2129   PTA Medications: Medications Prior to Admission  Medication Sig Dispense Refill Last Dose  . atorvastatin (LIPITOR) 80 MG tablet Take 80 mg by mouth daily.    Past Month at Unknown time  . folic acid (FOLVITE) 1 MG tablet Take 1 tablet (1 mg total) by mouth daily. (Patient not taking: Reported on 03/07/2018) 30 tablet 1 Not Taking at Unknown time  . insulin glargine (LANTUS) 100 UNIT/ML injection Inject 0.1 mLs (10 Units total) into the skin at bedtime. (Patient taking differently: Inject 16 Units into the skin at bedtime  as needed (BS). ) 10 mL 11 Past Month at Unknown time  . metFORMIN (GLUCOPHAGE) 500 MG tablet Take 1,000 mg by mouth 2 (two) times daily with a meal.    Past Month at Unknown time  . sertraline (ZOLOFT) 100 MG tablet Take 1 tablet (100 mg total) by mouth 2 (two) times daily. For depression/anxiety 60 tablet 0 Past Month at Unknown time  . thiamine 100 MG tablet Take 1 tablet (100 mg total) by mouth daily. (Patient not taking: Reported on 03/07/2018) 30 tablet 0 Not Taking at Unknown time    Patient Stressors: Financial difficulties Legal issue Marital or family conflict Medication change or noncompliance  Patient Strengths: Capable of independent living MetallurgistCommunication skills Financial means General fund of knowledge  Treatment Modalities: Medication Management, Group therapy, Case management,  1 to 1 session with clinician, Psychoeducation, Recreational therapy.   Physician Treatment Plan for Primary Diagnosis: Alcohol dependence  (HCC) Long Term Goal(s): Improvement in symptoms so as ready for discharge Improvement in symptoms so as ready for discharge   Short Term Goals: Ability to identify changes in lifestyle to reduce recurrence of condition will improve Ability to verbalize feelings will improve Ability to disclose and discuss suicidal ideas Ability to demonstrate self-control will improve Ability to identify and develop effective coping behaviors will improve Ability to maintain clinical measurements within normal limits will improve Compliance with prescribed medications will improve Ability to identify triggers associated with substance abuse/mental health issues will improve Ability to identify changes in lifestyle to reduce recurrence of condition will improve Ability to verbalize feelings will improve Ability to disclose and discuss suicidal ideas Ability to demonstrate self-control will improve Ability to identify and develop effective coping behaviors will improve Ability to maintain clinical measurements within normal limits will improve Compliance with prescribed medications will improve Ability to identify triggers associated with substance abuse/mental health issues will improve  Medication Management: Evaluate patient's response, side effects, and tolerance of medication regimen.  Therapeutic Interventions: 1 to 1 sessions, Unit Group sessions and Medication administration.  Evaluation of Outcomes: Progressing  Physician Treatment Plan for Secondary Diagnosis: Principal Problem:   Alcohol dependence (HCC) Active Problems:   MDD (major depressive disorder)   GAD (generalized anxiety disorder)   Depression  Long Term Goal(s): Improvement in symptoms so as ready for discharge Improvement in symptoms so as ready for discharge   Short Term Goals: Ability to identify changes in lifestyle to reduce recurrence of condition will improve Ability to verbalize feelings will improve Ability to  disclose and discuss suicidal ideas Ability to demonstrate self-control will improve Ability to identify and develop effective coping behaviors will improve Ability to maintain clinical measurements within normal limits will improve Compliance with prescribed medications will improve Ability to identify triggers associated with substance abuse/mental health issues will improve Ability to identify changes in lifestyle to reduce recurrence of condition will improve Ability to verbalize feelings will improve Ability to disclose and discuss suicidal ideas Ability to demonstrate self-control will improve Ability to identify and develop effective coping behaviors will improve Ability to maintain clinical measurements within normal limits will improve Compliance with prescribed medications will improve Ability to identify triggers associated with substance abuse/mental health issues will improve     Medication Management: Evaluate patient's response, side effects, and tolerance of medication regimen.  Therapeutic Interventions: 1 to 1 sessions, Unit Group sessions and Medication administration.  Evaluation of Outcomes: Progressing   RN Treatment Plan for Primary Diagnosis: Alcohol dependence (HCC) Long Term Goal(s):  Knowledge of disease and therapeutic regimen to maintain health will improve  Short Term Goals: Ability to demonstrate self-control, Ability to participate in decision making will improve, Ability to verbalize feelings will improve, Ability to identify and develop effective coping behaviors will improve and Compliance with prescribed medications will improve  Medication Management: RN will administer medications as ordered by provider, will assess and evaluate patient's response and provide education to patient for prescribed medication. RN will report any adverse and/or side effects to prescribing provider.  Therapeutic Interventions: 1 on 1 counseling sessions, Psychoeducation,  Medication administration, Evaluate responses to treatment, Monitor vital signs and CBGs as ordered, Perform/monitor CIWA, COWS, AIMS and Fall Risk screenings as ordered, Perform wound care treatments as ordered.  Evaluation of Outcomes: Progressing   LCSW Treatment Plan for Primary Diagnosis: Alcohol dependence (HCC) Long Term Goal(s): Safe transition to appropriate next level of care at discharge, Engage patient in therapeutic group addressing interpersonal concerns.  Short Term Goals: Engage patient in aftercare planning with referrals and resources, Increase social support, Increase ability to appropriately verbalize feelings, Identify triggers associated with mental health/substance abuse issues and Increase skills for wellness and recovery  Therapeutic Interventions: Assess for all discharge needs, 1 to 1 time with Social worker, Explore available resources and support systems, Assess for adequacy in community support network, Educate family and significant other(s) on suicide prevention, Complete Psychosocial Assessment, Interpersonal group therapy.  Evaluation of Outcomes: Progressing   Progress in Treatment: Attending groups: Yes. Participating in groups: Yes. Taking medication as prescribed: Yes. Toleration medication: Yes. Family/Significant other contact made: No, will contact:  once permission has been given. Patient understands diagnosis: Yes. Discussing patient identified problems/goals with staff: Yes. Medical problems stabilized or resolved: Yes. Denies suicidal/homicidal ideation: Yes. Issues/concerns per patient self-inventory: No. Other: none  New problem(s) identified: No, Describe:  none  New Short Term/Long Term Goal(s): detox, elimination of symptoms of psychosis, medication management for mood stabilization; elimination of SI thoughts; development of comprehensive mental wellness/sobriety plan.  Patient Goals:  "detox and stabilize medically.  I have high  blood pressure and diabetes."  Discharge Plan or Barriers: Patient reports that he would like a residential program at this time.  Patient is aligned with outpatient if not approved for residential due to physical health limitations.  Reason for Continuation of Hospitalization: Anxiety Depression Medication stabilization Withdrawal symptoms  Estimated Length of Stay: 1-5 days  Recreational Therapy: Patient Stressors: N/A  Patient Goal: Patient will identify 3 triggers for substance use within 5 recreation therapy group sessions  Attendees: Patient: Zacarias PontesRandall Rauda 10/16/2018 1:27 PM  Physician: Dr. Toni Amendlapacs, MD 10/16/2018 1:27 PM  Nursing:  10/16/2018 1:27 PM  RN Care Manager: 10/16/2018 1:27 PM  Social Worker: Penni HomansMichaela Stanfield, LCSW 10/16/2018 1:27 PM  Recreational Therapist:  10/16/2018 1:27 PM  Other:  10/16/2018 1:27 PM  Other:  10/16/2018 1:27 PM  Other: 10/16/2018 1:27 PM    Scribe for Treatment Team: Harden MoMichaela J Stanfield, LCSW 10/16/2018 1:27 PM

## 2018-10-16 NOTE — Progress Notes (Signed)
[facility] Progress Note  10/16/2018 4:55 PM Cory Foster.  MRN:  [id number] Subjective: Follow-up for this patient with alcohol abuse and mood disorder.  Patient says she is still feeling "rough".  He says that his blood pressure is low although he does not complain of dizziness.  Also notes that his blood sugars have been running low although when I checked them it looks like mostly things have been staying in the 100 range.  He tells me that he is still "going through withdrawals".  Looking at his vital signs that there does not seem to be any sign of severe withdrawal.  He is not tremulous not tachycardic.  He does remain on a Librium protocol.  Patient tells me that he felt like he had been doing okay until his sister took out a restraining order on him and now he has no place to stay.  Patient does not seem to have any specific plan for caring for himself. Principal Problem: Alcohol dependence (HCC) Diagnosis: Principal Problem:   Alcohol dependence (HCC) Active Problems:   MDD (major depressive disorder)   GAD (generalized anxiety disorder)   Depression  Total Time spent with patient: 30 minutes  Past Psychiatric History: Multiple visits to the hospital multiple prior admissions.  Has had alcohol withdrawal problems no full DTs.  No history of suicide attempts  Past Medical History:  Past Medical History:  Diagnosis Date  . Anxiety   . DDD (degenerative disc disease)   . Depression   . GERD (gastroesophageal reflux disease)   . Hyperlipidemia   . Hypertension   . Psoriasis   . Substance abuse (HCC)   . Substance induced mood disorder (HCC)   . Varicose vein     Past Surgical History:  Procedure Laterality Date  . CHOLECYSTECTOMY    . SINUSOTOMY    . VEIN LIGATION AND STRIPPING     Family History:  Family History  Problem Relation Age of Onset  . Depression Mother   . Anxiety disorder Mother   . Cancer Father    Family Psychiatric  History: See previous  note Social History:  Social History   Substance and Sexual Activity  Alcohol Use Yes  . Alcohol/week: 1.0 standard drinks  . Types: 1 Cans of beer per week   Comment: bi-weekly     Social History   Substance and Sexual Activity  Drug Use No    Social History   Socioeconomic History  . Marital status: Divorced    Spouse name: Not on file  . Number of children: Not on file  . Years of education: Not on file  . Highest education level: Not on file  Occupational History  . Not on file  Social Needs  . Financial resource strain: Not on file  . Food insecurity:    Worry: Not on file    Inability: Not on file  . Transportation needs:    Medical: Not on file    Non-medical: Not on file  Tobacco Use  . Smoking status: Current Every Day Smoker    Packs/day: 1.00    Types: Cigarettes  . Smokeless tobacco: Never Used  Substance and Sexual Activity  . Alcohol use: Yes    Alcohol/week: 1.0 standard drinks    Types: 1 Cans of beer per week    Comment: bi-weekly  . Drug use: No  . Sexual activity: Not on file  Lifestyle  . Physical activity:    Days per week: Not on  file    Minutes per session: Not on file  . Stress: Not on file  Relationships  . Social connections:    Talks on phone: Not on file    Gets together: Not on file    Attends religious service: Not on file    Active member of club or organization: Not on file    Attends meetings of clubs or organizations: Not on file    Relationship status: Not on file  Other Topics Concern  . Not on file  Social History Narrative  . Not on file   Additional Social History:                         Sleep: Fair  Appetite:  Fair  Current Medications: Current Facility-Administered Medications  Medication Dose Route Frequency Provider Last Rate Last Dose  . acetaminophen (TYLENOL) tablet 650 mg  650 mg Oral Q6H PRN He, Jun, MD   650 mg at 10/15/18 1449  . alum & mag hydroxide-simeth (MAALOX/MYLANTA)  200-200-20 MG/5ML suspension 30 mL  30 mL Oral Q4H PRN He, Jun, MD      . atorvastatin (LIPITOR) tablet 80 mg  80 mg Oral Daily He, Jun, MD   80 mg at 10/15/18 1710  . chlordiazePOXIDE (LIBRIUM) capsule 25 mg  25 mg Oral Q6H PRN He, Jun, MD   25 mg at 10/14/18 2124  . chlordiazePOXIDE (LIBRIUM) capsule 25 mg  25 mg Oral BH-qamhs He, Jun, MD       Followed by  . [START ON 10/18/2018] chlordiazePOXIDE (LIBRIUM) capsule 25 mg  25 mg Oral Daily He, Jun, MD      . folic acid (FOLVITE) tablet 1 mg  1 mg Oral Daily He, Jun, MD   1 mg at 10/16/18 0844  . hydrOXYzine (ATARAX/VISTARIL) tablet 25 mg  25 mg Oral Q6H PRN He, Jun, MD      . insulin aspart (novoLOG) injection 0-5 Units  0-5 Units Subcutaneous QHS He, Jun, MD      . insulin aspart (novoLOG) injection 0-9 Units  0-9 Units Subcutaneous TID WC He, Jun, MD   1 Units at 10/16/18 0802  . loperamide (IMODIUM) capsule 2-4 mg  2-4 mg Oral PRN He, Jun, MD      . magnesium hydroxide (MILK OF MAGNESIA) suspension 30 mL  30 mL Oral Daily PRN He, Jun, MD      . metFORMIN (GLUCOPHAGE) tablet 1,000 mg  1,000 mg Oral BID WC He, Jun, MD   1,000 mg at 10/16/18 0843  . multivitamin with minerals tablet 1 tablet  1 tablet Oral Daily He, Jun, MD   1 tablet at 10/16/18 0900  . nicotine (NICODERM CQ - dosed in mg/24 hours) patch 21 mg  21 mg Transdermal Daily He, Jun, MD   21 mg at 10/16/18 0802  . ondansetron (ZOFRAN-ODT) disintegrating tablet 4 mg  4 mg Oral Q6H PRN He, Jun, MD   4 mg at 10/16/18 1133  . sertraline (ZOLOFT) tablet 100 mg  100 mg Oral BID He, Jun, MD   100 mg at 10/16/18 0904  . thiamine (VITAMIN B-1) tablet 100 mg  100 mg Oral Daily He, Jun, MD   100 mg at 10/16/18 0843  . traZODone (DESYREL) tablet 150 mg  150 mg Oral QHS Clapacs, Jackquline DenmarkJohn T, MD        Lab Results:  Results for orders placed or performed during the hospital encounter of 10/14/18 (from the  past 48 hour(s))  Glucose, capillary     Status: None   Collection Time: 10/14/18  9:27 PM   Result Value Ref Range   Glucose-Capillary 83 70 - 99 mg/dL   Comment 1 Notify RN   Hemoglobin A1c     Status: None   Collection Time: 10/15/18  6:34 AM  Result Value Ref Range   Hgb A1c MFr Bld 5.6 4.8 - 5.6 %    Comment: (NOTE) Pre diabetes:          5.7%-6.4% Diabetes:              >6.4% Glycemic control for   <7.0% adults with diabetes    Mean Plasma Glucose 114.02 mg/dL    Comment: Performed at [facility], [address] [place], [place] [address]  TSH     Status: None   Collection Time: 10/15/18  6:34 AM  Result Value Ref Range   TSH 1.380 0.350 - 4.500 uIU/mL    Comment: Performed by a 3rd Generation assay with a functional sensitivity of <=0.01 uIU/mL. Performed at [facility], [address] [place], [place] [address]   Glucose, capillary     Status: Abnormal   Collection Time: 10/15/18  4:34 PM  Result Value Ref Range   Glucose-Capillary 121 (H) 70 - 99 mg/dL  Glucose, capillary     Status: Abnormal   Collection Time: 10/15/18  8:56 PM  Result Value Ref Range   Glucose-Capillary 109 (H) 70 - 99 mg/dL   Comment 1 Notify RN   Glucose, capillary     Status: Abnormal   Collection Time: 10/16/18  7:00 AM  Result Value Ref Range   Glucose-Capillary 149 (H) 70 - 99 mg/dL   Comment 1 Notify RN   Glucose, capillary     Status: Abnormal   Collection Time: 10/16/18 11:29 AM  Result Value Ref Range   Glucose-Capillary 113 (H) 70 - 99 mg/dL  Glucose, capillary     Status: Abnormal   Collection Time: 10/16/18  4:37 PM  Result Value Ref Range   Glucose-Capillary 102 (H) 70 - 99 mg/dL    Blood Alcohol level:  Lab Results  Component Value Date   ETH <10 10/13/2018   ETH 298 (H) 08/01/2018    Metabolic Disorder Labs: Lab Results  Component Value Date   HGBA1C 5.6 10/15/2018   MPG 114.02 10/15/2018   MPG 120 (H) 08/08/2013   No results found for: PROLACTIN Lab Results  Component Value Date   CHOL 113 01/14/2018   TRIG 141 01/14/2018   HDL  16 (L) 01/14/2018   CHOLHDL 7.1 01/14/2018   VLDL 28 01/14/2018   LDLCALC 69 01/14/2018    Physical Findings: AIMS: Facial and Oral Movements Muscles of Facial Expression: None, normal Lips and Perioral Area: None, normal Jaw: None, normal Tongue: None, normal,Extremity Movements Upper (arms, wrists, hands, fingers): None, normal Lower (legs, knees, ankles, toes): None, normal, Trunk Movements Neck, shoulders, hips: None, normal, Overall Severity Severity of abnormal movements (highest score from questions above): None, normal Incapacitation due to abnormal movements: None, normal Patient's awareness of abnormal movements (rate only patient's report): No Awareness, Dental Status Current problems with teeth and/or dentures?: No Does patient usually wear dentures?: No  CIWA:  CIWA-Ar Total: 6 COWS:     Musculoskeletal: Strength & Muscle Tone: within normal limits Gait & Station: normal Patient leans: N/A  Psychiatric Specialty Exam: Physical Exam  Nursing note and vitals reviewed. Constitutional: He  appears well-developed and well-nourished.  HENT:  Head: Normocephalic and atraumatic.  Eyes: Pupils are equal, round, and reactive to light. Conjunctivae are normal.  Neck: Normal range of motion.  Cardiovascular: Regular rhythm and normal heart sounds.  Respiratory: Effort normal. No respiratory distress.  GI: Soft.  Musculoskeletal: Normal range of motion.  Neurological: He is alert.  Skin: Skin is warm and dry.  Psychiatric: Judgment normal. His affect is blunt. His speech is delayed. He is slowed. Thought content is not paranoid. He expresses no homicidal and no suicidal ideation. He exhibits abnormal recent memory.    Review of Systems  Constitutional: Negative.   HENT: Negative.   Eyes: Negative.   Respiratory: Negative.   Cardiovascular: Negative.   Gastrointestinal: Negative.   Musculoskeletal: Negative.   Skin: Negative.   Neurological: Negative.    Psychiatric/Behavioral: Positive for substance abuse. Negative for depression, hallucinations, memory loss and suicidal ideas. The patient is nervous/anxious and has insomnia.     Blood pressure 99/67, pulse 60, temperature 97.8 F (36.6 C), temperature source Oral, resp. rate 18, height 6\' 2"  (1.88 m), weight 102.5 kg, SpO2 94 %.Body mass index is 29.02 kg/m.  General Appearance: Disheveled  Eye Contact:  Fair  Speech:  Clear and Coherent  Volume:  Normal  Mood:  Anxious  Affect:  Congruent  Thought Process:  Coherent  Orientation:  Full (Time, Place, and Person)  Thought Content:  Logical  Suicidal Thoughts:  No  Homicidal Thoughts:  No  Memory:  Immediate;   Fair Recent;   Fair Remote;   Fair  Judgement:  Fair  Insight:  Fair  Psychomotor Activity:  Decreased  Concentration:  Concentration: Fair  Recall:  FiservFair  Fund of Knowledge:  Fair  Language:  Fair  Akathisia:  No  Handed:  Right  AIMS (if indicated):     Assets:  Desire for Improvement Resilience  ADL's:  Intact  Cognition:  Impaired,  Mild  Sleep:  Number of Hours: 8.15     Treatment Plan Summary: Daily contact with patient to assess and evaluate symptoms and progress in treatment, Medication management and Plan Patient with chronic alcohol abuse who is both having a little bit of detox and suffering from major social stressors.  On the other hand he does have a check and potentially has the ability to take care of himself.  He seems ambivalent about whether he would want to go for inpatient rehab.  Currently not suicidal not homicidal not psychotic.  I did add trazodone as a standing medicine because of his complaints about sleeping.  No other change to medication for today.  Encourage group attendance.  Treatment team will discuss tomorrow.  Mordecai RasmussenJohn Clapacs, MD 10/16/2018, 4:55 PM

## 2018-10-17 LAB — GLUCOSE, CAPILLARY
Glucose-Capillary: 150 mg/dL — ABNORMAL HIGH (ref 70–99)
Glucose-Capillary: 106 mg/dL — ABNORMAL HIGH (ref 70–99)
Glucose-Capillary: 112 mg/dL — ABNORMAL HIGH (ref 70–99)
Glucose-Capillary: 140 mg/dL — ABNORMAL HIGH (ref 70–99)

## 2018-10-17 MED ORDER — TRAZODONE HCL 100 MG PO TABS
100.0000 mg | ORAL_TABLET | Freq: Every day | ORAL | Status: DC
  Administered 2018-10-17 – 2018-10-21 (×5): 100 mg via ORAL
  Filled 2018-10-17 (×5): qty 1

## 2018-10-17 MED ORDER — SERTRALINE HCL 25 MG PO TABS
50.0000 mg | ORAL_TABLET | Freq: Every day | ORAL | Status: DC
  Administered 2018-10-17 – 2018-10-19 (×3): 50 mg via ORAL
  Filled 2018-10-17 (×3): qty 2

## 2018-10-17 NOTE — [facility] Group Notes (Signed)
[facility] Group Notes:  (Nursing/MHT/Case Management/Adjunct)  Date:  10/17/2018  Time:  10:19 PM  Type of Therapy:  Group Therapy  Participation Level:  Active  Participation Quality:  Appropriate  Affect:  Appropriate  Cognitive:  Appropriate  Insight:  Appropriate  Engagement in Group:  Engaged  Modes of Intervention:  Discussion  Summary of Progress/Problems: Cory Foster stated his goal was to feel better and walk around. Cory Foster stated he did not want to lay in bed. Cory Foster stated he was able to accomplish his goal. Cory Foster stated he was able to talk with the social worker about housing as well. MHT informed patients of rules and expectations while on the unit. MHT informed patients they needed to provide their code to any one they wanted to be able to contact them. MHT informed patients of visitation hours and phone hours. MHT informed patients of to fill out snack sheet. MHT informed patients food was not allowed in the rooms. MHT informed patients there was no touching, hugging, or doing other patient's hair on the unit. MHT informed patients of routine checks and to cover appropriately throughout the night. MHT reminded patients not to give out personal information and to only use first names. Jinger Neighbors 10/17/2018, 10:19 PM

## 2018-10-17 NOTE — Plan of Care (Signed)
  Problem: Coping: Goal: Ability to verbalize frustrations and anger appropriately will improve Outcome: Progressing  Patient denies frustration or anger to staff.

## 2018-10-17 NOTE — Progress Notes (Signed)
D- Patient alert and oriented. Patient presents in a depressed, but pleasant mood on assessment stating that he feels "like hell". Patient endorses passive SI, without a plan, and contracts for safety with this Clinical research associate. Patient rated his depression an "8/10" and his anxiety a "10/10" stating that "it's about the same", in which he stated previously that he is "totally depressed" and he has "anxiety attacks". Patient denies HI, AVH, at this time. Patient's goal for today is to "go to meeting and get to feeling better", in which he will "stay out of bed and walk" in order to accomplish this goal.  A- Scheduled medications administered to patient, per MD orders. Support and encouragement provided.  Routine safety checks conducted every 15 minutes.  Patient informed to notify staff with problems or concerns.  R- No adverse drug reactions noted. Patient contracts for safety at this time. Patient compliant with medications and treatment plan. Patient receptive, calm, and cooperative. Patient interacts well with others on the unit.  Patient remains safe at this time.

## 2018-10-17 NOTE — Progress Notes (Signed)
Recreation Therapy Notes   Date: 10/17/2018  Time: 9:30 am   Location: Craft room   Behavioral response: N/A   Intervention Topic: Communication   Discussion/Intervention: Patient did not attend group.   Clinical Observations/Feedback:  Patient did not attend group.   Virtie Bungert LRT/CTRS        Roselee Tayloe 10/17/2018 10:39 AM

## 2018-10-17 NOTE — Plan of Care (Signed)
Patient verbalizes understanding of the general information that's been provided to him and all questions and concerns have been addressed and answered at this time. Patient rated his depression a "8/10" and his anxiety "10/10" stating that "it's about the same", in which he stated previously that he is "totally depressed" and he has "anxiety attacks". Patient endorsed passive SI, without a plan, and contracts for safety with this Clinical research associate. Patient has been present in the milieu and has interacted well with staff and other members on the unit without any issues thus far. Patient has the ability to make informed decisions and has been in compliance with his prescribed therapeutic regimen. Patient has been free from injury and remains safe on the unit at this time.  Problem: Education: Goal: Knowledge of [place] [facility] Education information/materials will improve Outcome: Progressing Goal: Emotional status will improve Outcome: Progressing Goal: Mental status will improve Outcome: Progressing   Problem: Activity: Goal: Interest or engagement in activities will improve Outcome: Progressing Goal: Sleeping patterns will improve Outcome: Progressing   Problem: Coping: Goal: Ability to verbalize frustrations and anger appropriately will improve Outcome: Progressing Goal: Ability to demonstrate self-control will improve Outcome: Progressing   Problem: Safety: Goal: Periods of time without injury will increase Outcome: Progressing   Problem: Education: Goal: Ability to make informed decisions regarding treatment will improve Outcome: Progressing   Problem: Coping: Goal: Coping ability will improve Outcome: Progressing   Problem: Health Behavior/Discharge Planning: Goal: Identification of resources available to assist in meeting health care needs will improve Outcome: Progressing   Problem: Medication: Goal: Compliance with prescribed medication regimen will improve Outcome:  Progressing   Problem: Self-Concept: Goal: Ability to disclose and discuss suicidal ideas will improve Outcome: Progressing Goal: Will verbalize positive feelings about self Outcome: Progressing   Problem: Education: Goal: Utilization of techniques to improve thought processes will improve Outcome: Progressing   Problem: Coping: Goal: Coping ability will improve Outcome: Progressing

## 2018-10-17 NOTE — Progress Notes (Signed)
[facility]  10/17/2018 3:18 PM Cory Georgeanna HarrisonMcDougal Meloche Jr.  MRN:  [id number] Subjective: Follow-up for this patient with alcohol abuse and depression.  Patient seen chart reviewed.  Patient was in bed again today looking very tired and rundown.  He told me that he feels sick and tired and gets dizzy when he tries to get up.  Tried to walk to the med room and tried to walk to the shower today and both times felt dizzy.  He looks poorly groomed like he has not been taking care of himself.  Blood pressure remains low.  He is not having tremors not having tachycardia and does not appear to be delirious.  Mood still depressed with passive but not active suicidal thoughts. Principal Problem: Alcohol dependence (HCC) Diagnosis: Principal Problem:   Alcohol dependence (HCC) Active Problems:   MDD (major depressive disorder)   GAD (generalized anxiety disorder)   Depression  Total Time spent with patient: 30 minutes  Past Psychiatric History: Patient has a long history of alcohol abuse and depression.  Has been treated with medicines for depression including Zoloft in the past but it is unclear if this is ever been helpful because his compliance has been spotty at best.  Long history of alcohol abuse.  Patient says he has tried to quit in the past but not recently.  Past Medical History:  Past Medical History:  Diagnosis Date  . Anxiety   . DDD (degenerative disc disease)   . Depression   . GERD (gastroesophageal reflux disease)   . Hyperlipidemia   . Hypertension   . Psoriasis   . Substance abuse (HCC)   . Substance induced mood disorder (HCC)   . Varicose vein     Past Surgical History:  Procedure Laterality Date  . CHOLECYSTECTOMY    . SINUSOTOMY    . VEIN LIGATION AND STRIPPING     Family History:  Family History  Problem Relation Age of Onset  . Depression Mother   . Anxiety disorder Mother   . Cancer Father    Family Psychiatric  History: See previous note Social  History:  Social History   Substance and Sexual Activity  Alcohol Use Yes  . Alcohol/week: 1.0 standard drinks  . Types: 1 Cans of beer per week   Comment: bi-weekly     Social History   Substance and Sexual Activity  Drug Use No    Social History   Socioeconomic History  . Marital status: Divorced    Spouse name: Not on file  . Number of children: Not on file  . Years of education: Not on file  . Highest education level: Not on file  Occupational History  . Not on file  Social Needs  . Financial resource strain: Not on file  . Food insecurity:    Worry: Not on file    Inability: Not on file  . Transportation needs:    Medical: Not on file    Non-medical: Not on file  Tobacco Use  . Smoking status: Current Every Day Smoker    Packs/day: 1.00    Types: Cigarettes  . Smokeless tobacco: Never Used  Substance and Sexual Activity  . Alcohol use: Yes    Alcohol/week: 1.0 standard drinks    Types: 1 Cans of beer per week    Comment: bi-weekly  . Drug use: No  . Sexual activity: Not on file  Lifestyle  . Physical activity:    Days per week: Not on file  Minutes per session: Not on file  . Stress: Not on file  Relationships  . Social connections:    Talks on phone: Not on file    Gets together: Not on file    Attends religious service: Not on file    Active member of club or organization: Not on file    Attends meetings of clubs or organizations: Not on file    Relationship status: Not on file  Other Topics Concern  . Not on file  Social History Narrative  . Not on file   Additional Social History:                         Sleep: Fair  Appetite:  Fair  Current Medications: Current Facility-Administered Medications  Medication Dose Route Frequency Provider Last Rate Last Dose  . acetaminophen (TYLENOL) tablet 650 mg  650 mg Oral Q6H PRN He, Jun, MD   650 mg at 10/17/18 0824  . alum & mag hydroxide-simeth (MAALOX/MYLANTA) 200-200-20 MG/5ML  suspension 30 mL  30 mL Oral Q4H PRN He, Jun, MD      . atorvastatin (LIPITOR) tablet 80 mg  80 mg Oral Daily He, Jun, MD   80 mg at 10/16/18 1705  . folic acid (FOLVITE) tablet 1 mg  1 mg Oral Daily He, Jun, MD   1 mg at 10/17/18 [id number]  . insulin aspart (novoLOG) injection 0-5 Units  0-5 Units Subcutaneous QHS He, Jun, MD      . insulin aspart (novoLOG) injection 0-9 Units  0-9 Units Subcutaneous TID WC He, Jun, MD   1 Units at 10/17/18 0807  . magnesium hydroxide (MILK OF MAGNESIA) suspension 30 mL  30 mL Oral Daily PRN He, Jun, MD   30 mL at 10/17/18 0829  . metFORMIN (GLUCOPHAGE) tablet 1,000 mg  1,000 mg Oral BID WC He, Jun, MD   1,000 mg at 10/17/18 [id number]  . multivitamin with minerals tablet 1 tablet  1 tablet Oral Daily He, Jun, MD   1 tablet at 10/17/18 [id number]  . nicotine (NICODERM CQ - dosed in mg/24 hours) patch 21 mg  21 mg Transdermal Daily He, Jun, MD   21 mg at 10/17/18 0808  . sertraline (ZOLOFT) tablet 50 mg  50 mg Oral Daily Doyce Saling T, MD      . thiamine (VITAMIN B-1) tablet 100 mg  100 mg Oral Daily He, Jun, MD   100 mg at 10/17/18 [id number]  . traZODone (DESYREL) tablet 100 mg  100 mg Oral QHS Martese Vanatta T, MD        Lab Results:  Results for orders placed or performed during the hospital encounter of 10/14/18 (from the past 48 hour(s))  Glucose, capillary     Status: Abnormal   Collection Time: 10/15/18  4:34 PM  Result Value Ref Range   Glucose-Capillary 121 (H) 70 - 99 mg/dL  Glucose, capillary     Status: Abnormal   Collection Time: 10/15/18  8:56 PM  Result Value Ref Range   Glucose-Capillary 109 (H) 70 - 99 mg/dL   Comment 1 Notify RN   Glucose, capillary     Status: Abnormal   Collection Time: 10/16/18  7:00 AM  Result Value Ref Range   Glucose-Capillary 149 (H) 70 - 99 mg/dL   Comment 1 Notify RN   Glucose, capillary     Status: Abnormal   Collection Time: 10/16/18 11:29 AM  Result Value Ref Range   Glucose-Capillary  113 (H) 70 - 99 mg/dL  Glucose,  capillary     Status: Abnormal   Collection Time: 10/16/18  4:37 PM  Result Value Ref Range   Glucose-Capillary 102 (H) 70 - 99 mg/dL  Glucose, capillary     Status: Abnormal   Collection Time: 10/16/18 10:21 PM  Result Value Ref Range   Glucose-Capillary 151 (H) 70 - 99 mg/dL   Comment 1 Notify RN   Glucose, capillary     Status: Abnormal   Collection Time: 10/17/18  7:08 AM  Result Value Ref Range   Glucose-Capillary 150 (H) 70 - 99 mg/dL  Glucose, capillary     Status: Abnormal   Collection Time: 10/17/18 11:51 AM  Result Value Ref Range   Glucose-Capillary 106 (H) 70 - 99 mg/dL    Blood Alcohol level:  Lab Results  Component Value Date   ETH <10 10/13/2018   ETH 298 (H) 08/01/2018    Metabolic Disorder Labs: Lab Results  Component Value Date   HGBA1C 5.6 10/15/2018   MPG 114.02 10/15/2018   MPG 120 (H) 08/08/2013   No results found for: PROLACTIN Lab Results  Component Value Date   CHOL 113 01/14/2018   TRIG 141 01/14/2018   HDL 16 (L) 01/14/2018   CHOLHDL 7.1 01/14/2018   VLDL 28 01/14/2018   LDLCALC 69 01/14/2018    Physical Findings: AIMS: Facial and Oral Movements Muscles of Facial Expression: None, normal Lips and Perioral Area: None, normal Jaw: None, normal Tongue: None, normal,Extremity Movements Upper (arms, wrists, hands, fingers): None, normal Lower (legs, knees, ankles, toes): None, normal, Trunk Movements Neck, shoulders, hips: None, normal, Overall Severity Severity of abnormal movements (highest score from questions above): None, normal Incapacitation due to abnormal movements: None, normal Patient's awareness of abnormal movements (rate only patient's report): No Awareness, Dental Status Current problems with teeth and/or dentures?: No Does patient usually wear dentures?: No  CIWA:  CIWA-Ar Total: 2 COWS:     Musculoskeletal: Strength & Muscle Tone: decreased Gait & Station: unsteady Patient leans: N/A  Psychiatric Specialty  Exam: Physical Exam  Nursing note and vitals reviewed. Constitutional: He appears well-developed and well-nourished.  HENT:  Head: Normocephalic and atraumatic.  Eyes: Pupils are equal, round, and reactive to light. Conjunctivae are normal.  Neck: Normal range of motion.  Cardiovascular: Regular rhythm and normal heart sounds.  Respiratory: Effort normal. No respiratory distress.  GI: Soft.  Musculoskeletal: Normal range of motion.  Neurological: He is alert.  Skin: Skin is warm and dry.  Psychiatric: Judgment normal. His speech is delayed. He is slowed. He exhibits a depressed mood. He expresses no homicidal and no suicidal ideation. He exhibits abnormal recent memory.    Review of Systems  Constitutional: Positive for malaise/fatigue.  HENT: Negative.   Eyes: Negative.   Respiratory: Negative.   Cardiovascular: Negative.   Gastrointestinal: Negative.   Musculoskeletal: Negative.   Skin: Negative.   Neurological: Negative.   Psychiatric/Behavioral: Positive for depression. Negative for hallucinations, substance abuse and suicidal ideas. The patient is nervous/anxious and has insomnia.     Blood pressure 94/78, pulse 67, temperature 97.7 F (36.5 C), temperature source Oral, resp. rate 20, height 6\' 2"  (1.88 m), weight 102.5 kg, SpO2 94 %.Body mass index is 29.02 kg/m.  General Appearance: Disheveled  Eye Contact:  Fair  Speech:  Slow  Volume:  Decreased  Mood:  Depressed and Dysphoric  Affect:  Constricted  Thought Process:  Coherent  Orientation:  Full (Time, Place, and  Person)  Thought Content:  Logical  Suicidal Thoughts:  Yes.  without intent/plan  Homicidal Thoughts:  No  Memory:  Immediate;   Fair Recent;   Fair Remote;   Fair  Judgement:  Fair  Insight:  Fair  Psychomotor Activity:  Decreased  Concentration:  Concentration: Fair  Recall:  [employer] of Knowledge:  Fair  Language:  Fair  Akathisia:  No  Handed:  Right  AIMS (if indicated):     Assets:   Desire for Improvement  ADL's:  Impaired  Cognition:  Impaired,  Mild  Sleep:  Number of Hours: 6.75     Treatment Plan Summary: Daily contact with patient to assess and evaluate symptoms and progress in treatment, Medication management and Plan As far as his alcohol abuse, the patient is not showing active signs of alcohol withdrawal.  I am going to discontinue the last of the Librium as he does not need anything sedating at this point.  Patient's blood pressure does remain low despite being on no medicine that would lower blood pressure.  I am going to get an EKG to begin a check to see if he has gotten more cardiomyopathy since last time which may account for weaker heart function.  Encourage patient to be careful and not to get up quickly if he is feeling dizzy and to make sure he is staying well-hydrated.  Medicines adjusted slightly.  I think 200 mg a day of Zoloft is more than he needs given his recent noncompliance and I will cut that down to 50 for now.  Continue to follow-up and review with treatment team  Mordecai Rasmussen, MD 10/17/2018, 3:18 PM

## 2018-10-17 NOTE — Progress Notes (Signed)
Patient is alert and oriented x 4, affect is flat, but she brightens upon approach. Patient's thoughts are organized and oriented, his mood is labile, forwards very little guarded and isolates to self in his room. Patient rated his  depression a 6/10 on a scale (0 low and 10 high) and was offered support and encouraged to attend evening wrap up group. Patient complaint with evening medication but, did not attend evening wrap up group. 15 minutes safety checks maintained will continue to monitor .

## 2018-10-17 NOTE — [facility] Group Notes (Signed)
LCSW Group Therapy Note  10/17/2018 1:00 PM  Type of Therapy/Topic:  Group Therapy:  Feelings about Diagnosis  Participation Level:  Active   Description of Group:   This group will allow patients to explore their thoughts and feelings about diagnoses they have received. Patients will be guided to explore their level of understanding and acceptance of these diagnoses. Facilitator will encourage patients to process their thoughts and feelings about the reactions of others to their diagnosis and will guide patients in identifying ways to discuss their diagnosis with significant others in their lives. This group will be process-oriented, with patients participating in exploration of their own experiences, giving and receiving support, and processing challenge from other group members.   Therapeutic Goals: 1. Patient will demonstrate understanding of diagnosis as evidenced by identifying two or more symptoms of the disorder 2. Patient will be able to express two feelings regarding the diagnosis 3. Patient will demonstrate their ability to communicate their needs through discussion and/or role play  Summary of Patient Progress: Patient was present for group and an active participant. Patient shared with the group that he has struggled with substance use issues.  Patient reports that he has felt anger and frustration with his diagnosis as well as hopelessness.  Patient reports that he has also struggled with feelings of being misunderstood.  Patient reports that he has felt hurt at his family "turning they back on me".  Patient was receptive to a conversation on establishing boundaries and the benefits of establishing boundaries.  Patient received support from other group members and was supportive to others.    Therapeutic Modalities:   Cognitive Behavioral Therapy Brief Therapy Feelings Identification   Penni Homans, MSW, LCSW 10/17/2018 2:40 PM

## 2018-10-18 LAB — GLUCOSE, CAPILLARY
Glucose-Capillary: 153 mg/dL — ABNORMAL HIGH (ref 70–99)
Glucose-Capillary: 176 mg/dL — ABNORMAL HIGH (ref 70–99)
Glucose-Capillary: 115 mg/dL — ABNORMAL HIGH (ref 70–99)
Glucose-Capillary: 115 mg/dL — ABNORMAL HIGH (ref 70–99)
Glucose-Capillary: 97 mg/dL (ref 70–99)

## 2018-10-18 NOTE — [facility] Group Notes (Signed)
Emotional Regulation 10/18/2018 1PM  Type of Therapy/Topic:  Group Therapy:  Emotion Regulation  Participation Level:  Did Not Attend   Description of Group:   The purpose of this group is to assist patients in learning to regulate negative emotions and experience positive emotions. Patients will be guided to discuss ways in which they have been vulnerable to their negative emotions. These vulnerabilities will be juxtaposed with experiences of positive emotions or situations, and patients will be challenged to use positive emotions to combat negative ones. Special emphasis will be placed on coping with negative emotions in conflict situations, and patients will process healthy conflict resolution skills.  Therapeutic Goals: 1. Patient will identify two positive emotions or experiences to reflect on in order to balance out negative emotions 2. Patient will label two or more emotions that they find the most difficult to experience 3. Patient will demonstrate positive conflict resolution skills through discussion and/or role plays  Summary of Patient Progress:       Therapeutic Modalities:   Cognitive Behavioral Therapy Feelings Identification Dialectical Behavioral Therapy   Suzan Slick, LCSW 10/18/2018 2:00 PM

## 2018-10-18 NOTE — Plan of Care (Signed)
Patient verbalizes understanding of the general information that's been provided to him and all questions and concerns have been addressed and answered at this time. Patient rated his depression and anxiety a "9/10" stating that "trying to get over all of this" and "I got the shakes and everything" is why he's feeling this way. Patient denies SI/HI/AVH to this Clinical research associate. Patient slept on and off throughout the first part of the day, and afterwards has been present in the milieu interacting well with staff and other members on the unit without any issues thus far. Patient has the ability to make informed decisions and has been in compliance with his prescribed therapeutic regimen. Patient has been free from injury and remains safe on the unit at this time.  Problem: Education: Goal: Knowledge of [place] [facility] Education information/materials will improve Outcome: Progressing Goal: Emotional status will improve Outcome: Progressing Goal: Mental status will improve Outcome: Progressing   Problem: Activity: Goal: Interest or engagement in activities will improve Outcome: Progressing Goal: Sleeping patterns will improve Outcome: Progressing   Problem: Coping: Goal: Ability to verbalize frustrations and anger appropriately will improve Outcome: Progressing Goal: Ability to demonstrate self-control will improve Outcome: Progressing   Problem: Safety: Goal: Periods of time without injury will increase Outcome: Progressing   Problem: Education: Goal: Ability to make informed decisions regarding treatment will improve Outcome: Progressing   Problem: Coping: Goal: Coping ability will improve Outcome: Progressing   Problem: Health Behavior/Discharge Planning: Goal: Identification of resources available to assist in meeting health care needs will improve Outcome: Progressing   Problem: Medication: Goal: Compliance with prescribed medication regimen will improve Outcome: Progressing    Problem: Self-Concept: Goal: Ability to disclose and discuss suicidal ideas will improve Outcome: Progressing Goal: Will verbalize positive feelings about self Outcome: Progressing   Problem: Education: Goal: Utilization of techniques to improve thought processes will improve Outcome: Progressing   Problem: Coping: Goal: Coping ability will improve Outcome: Progressing

## 2018-10-18 NOTE — Progress Notes (Signed)
[facility] MD Progress Note  10/18/2018 3:21 PM Cory Foster Cory Foster.  MRN:  [id number] Subjective: Follow-up for this gentleman with alcohol abuse and depression.  Patient continues to feel physically sick.  His blood pressure continues to be low.  I got an EKG yesterday which is completely normal and he is not having other typical symptoms of heart failure.  Patient feels tired and feels emotionally down but denies any suicidal thoughts.  Denies any psychotic symptoms.  No signs really of any alcohol withdrawal.  He is lucid and able to engage in some conversation and agrees to a plan for transfer to a substance abuse treatment facility Principal Problem: Alcohol dependence (HCC) Diagnosis: Principal Problem:   Alcohol dependence (HCC) Active Problems:   MDD (major depressive disorder)   GAD (generalized anxiety disorder)   Depression  Total Time spent with patient: 30 minutes  Past Psychiatric History: Long history of alcohol abuse and depression  Past Medical History:  Past Medical History:  Diagnosis Date  . Anxiety   . DDD (degenerative disc disease)   . Depression   . GERD (gastroesophageal reflux disease)   . Hyperlipidemia   . Hypertension   . Psoriasis   . Substance abuse (HCC)   . Substance induced mood disorder (HCC)   . Varicose vein     Past Surgical History:  Procedure Laterality Date  . CHOLECYSTECTOMY    . SINUSOTOMY    . VEIN LIGATION AND STRIPPING     Family History:  Family History  Problem Relation Age of Onset  . Depression Mother   . Anxiety disorder Mother   . Cancer Father    Family Psychiatric  History: See previous note Social History:  Social History   Substance and Sexual Activity  Alcohol Use Yes  . Alcohol/week: 1.0 standard drinks  . Types: 1 Cans of beer per week   Comment: bi-weekly     Social History   Substance and Sexual Activity  Drug Use No    Social History   Socioeconomic History  . Marital status: Divorced     Spouse name: Not on file  . Number of children: Not on file  . Years of education: Not on file  . Highest education level: Not on file  Occupational History  . Not on file  Social Needs  . Financial resource strain: Not on file  . Food insecurity:    Worry: Not on file    Inability: Not on file  . Transportation needs:    Medical: Not on file    Non-medical: Not on file  Tobacco Use  . Smoking status: Current Every Day Smoker    Packs/day: 1.00    Types: Cigarettes  . Smokeless tobacco: Never Used  Substance and Sexual Activity  . Alcohol use: Yes    Alcohol/week: 1.0 standard drinks    Types: 1 Cans of beer per week    Comment: bi-weekly  . Drug use: No  . Sexual activity: Not on file  Lifestyle  . Physical activity:    Days per week: Not on file    Minutes per session: Not on file  . Stress: Not on file  Relationships  . Social connections:    Talks on phone: Not on file    Gets together: Not on file    Attends religious service: Not on file    Active member of club or organization: Not on file    Attends meetings of clubs or organizations: Not on file  Relationship status: Not on file  Other Topics Concern  . Not on file  Social History Narrative  . Not on file   Additional Social History:                         Sleep: Fair  Appetite:  Fair  Current Medications: Current Facility-Administered Medications  Medication Dose Route Frequency Provider Last Rate Last Dose  . acetaminophen (TYLENOL) tablet 650 mg  650 mg Oral Q6H PRN He, Jun, MD   650 mg at 10/17/18 2213  . alum & mag hydroxide-simeth (MAALOX/MYLANTA) 200-200-20 MG/5ML suspension 30 mL  30 mL Oral Q4H PRN He, Jun, MD      . atorvastatin (LIPITOR) tablet 80 mg  80 mg Oral Daily He, Jun, MD   80 mg at 10/17/18 1755  . folic acid (FOLVITE) tablet 1 mg  1 mg Oral Daily He, Jun, MD   1 mg at 10/18/18 0910  . insulin aspart (novoLOG) injection 0-5 Units  0-5 Units Subcutaneous QHS He, Jun,  MD      . insulin aspart (novoLOG) injection 0-9 Units  0-9 Units Subcutaneous TID WC He, Jun, MD   2 Units at 10/18/18 0911  . magnesium hydroxide (MILK OF MAGNESIA) suspension 30 mL  30 mL Oral Daily PRN He, Jun, MD   30 mL at 10/17/18 0829  . metFORMIN (GLUCOPHAGE) tablet 1,000 mg  1,000 mg Oral BID WC He, Jun, MD   1,000 mg at 10/18/18 0910  . multivitamin with minerals tablet 1 tablet  1 tablet Oral Daily He, Jun, MD   1 tablet at 10/18/18 0909  . nicotine (NICODERM CQ - dosed in mg/24 hours) patch 21 mg  21 mg Transdermal Daily He, Jun, MD   21 mg at 10/18/18 0910  . sertraline (ZOLOFT) tablet 50 mg  50 mg Oral Daily Kelcy Baeten, Jackquline Denmark, MD   50 mg at 10/18/18 0909  . thiamine (VITAMIN B-1) tablet 100 mg  100 mg Oral Daily He, Jun, MD   100 mg at 10/18/18 0910  . traZODone (DESYREL) tablet 100 mg  100 mg Oral QHS Dulcemaria Bula T, MD   100 mg at 10/17/18 2213    Lab Results:  Results for orders placed or performed during the hospital encounter of 10/14/18 (from the past 48 hour(s))  Glucose, capillary     Status: Abnormal   Collection Time: 10/16/18  4:37 PM  Result Value Ref Range   Glucose-Capillary 102 (H) 70 - 99 mg/dL  Glucose, capillary     Status: Abnormal   Collection Time: 10/16/18 10:21 PM  Result Value Ref Range   Glucose-Capillary 151 (H) 70 - 99 mg/dL   Comment 1 Notify RN   Glucose, capillary     Status: Abnormal   Collection Time: 10/17/18  7:08 AM  Result Value Ref Range   Glucose-Capillary 150 (H) 70 - 99 mg/dL  Glucose, capillary     Status: Abnormal   Collection Time: 10/17/18 11:51 AM  Result Value Ref Range   Glucose-Capillary 106 (H) 70 - 99 mg/dL  Glucose, capillary     Status: Abnormal   Collection Time: 10/17/18  4:44 PM  Result Value Ref Range   Glucose-Capillary 112 (H) 70 - 99 mg/dL  Glucose, capillary     Status: Abnormal   Collection Time: 10/17/18 10:10 PM  Result Value Ref Range   Glucose-Capillary 140 (H) 70 - 99 mg/dL  Glucose, capillary  Status: Abnormal   Collection Time: 10/18/18  6:52 AM  Result Value Ref Range   Glucose-Capillary 153 (H) 70 - 99 mg/dL   Comment 1 Notify RN   Glucose, capillary     Status: Abnormal   Collection Time: 10/18/18  9:07 AM  Result Value Ref Range   Glucose-Capillary 176 (H) 70 - 99 mg/dL  Glucose, capillary     Status: Abnormal   Collection Time: 10/18/18 12:06 PM  Result Value Ref Range   Glucose-Capillary 115 (H) 70 - 99 mg/dL    Blood Alcohol level:  Lab Results  Component Value Date   ETH <10 10/13/2018   ETH 298 (H) 08/01/2018    Metabolic Disorder Labs: Lab Results  Component Value Date   HGBA1C 5.6 10/15/2018   MPG 114.02 10/15/2018   MPG 120 (H) 08/08/2013   No results found for: PROLACTIN Lab Results  Component Value Date   CHOL 113 01/14/2018   TRIG 141 01/14/2018   HDL 16 (L) 01/14/2018   CHOLHDL 7.1 01/14/2018   VLDL 28 01/14/2018   LDLCALC 69 01/14/2018    Physical Findings: AIMS: Facial and Oral Movements Muscles of Facial Expression: None, normal Lips and Perioral Area: None, normal Jaw: None, normal Tongue: None, normal,Extremity Movements Upper (arms, wrists, hands, fingers): None, normal Lower (legs, knees, ankles, toes): None, normal, Trunk Movements Neck, shoulders, hips: None, normal, Overall Severity Severity of abnormal movements (highest score from questions above): None, normal Incapacitation due to abnormal movements: None, normal Patient's awareness of abnormal movements (rate only patient's report): No Awareness, Dental Status Current problems with teeth and/or dentures?: No Does patient usually wear dentures?: No  CIWA:  CIWA-Ar Total: 1 COWS:     Musculoskeletal: Strength & Muscle Tone: decreased Gait & Station: unsteady Patient leans: N/A  Psychiatric Specialty Exam: Physical Exam  Nursing note and vitals reviewed. Constitutional: He appears well-developed and well-nourished.  HENT:  Head: Normocephalic and atraumatic.   Eyes: Pupils are equal, round, and reactive to light. Conjunctivae are normal.  Neck: Normal range of motion.  Cardiovascular: Normal heart sounds.  Respiratory: Effort normal.  GI: Soft.  Musculoskeletal: Normal range of motion.  Neurological: He is alert.  Skin: Skin is warm and dry.  Psychiatric: Judgment and thought content normal. His affect is blunt. His speech is delayed. He is slowed. Cognition and memory are impaired.    Review of Systems  Constitutional: Negative.   HENT: Negative.   Eyes: Negative.   Respiratory: Negative.   Cardiovascular: Negative.   Gastrointestinal: Negative.   Musculoskeletal: Negative.   Skin: Negative.   Neurological: Positive for dizziness.  Psychiatric/Behavioral: Positive for depression. Negative for hallucinations, memory loss, substance abuse and suicidal ideas. The patient is nervous/anxious. The patient does not have insomnia.     Blood pressure 93/63, pulse 69, temperature (!) 97.5 F (36.4 C), temperature source Oral, resp. rate 18, height 6\' 2"  (1.88 m), weight 102.5 kg, SpO2 97 %.Body mass index is 29.02 kg/m.  General Appearance: Disheveled  Eye Contact:  Fair  Speech:  Slow  Volume:  Decreased  Mood:  Dysphoric  Affect:  Depressed  Thought Process:  Coherent  Orientation:  Full (Time, Place, and Person)  Thought Content:  Logical  Suicidal Thoughts:  No  Homicidal Thoughts:  No  Memory:  Immediate;   Fair Recent;   Fair Remote;   Fair  Judgement:  Fair  Insight:  Fair  Psychomotor Activity:  Decreased  Concentration:  Concentration: Fair  Recall:  Fair  [employer] Energy[employer] of Knowledge:  Fair  Language:  Fair  Akathisia:  No  Handed:  Right  AIMS (if indicated):     Assets:  Desire for Improvement Resilience  ADL's:  Intact  Cognition:  WNL  Sleep:  Number of Hours: 7.5     Treatment Plan Summary: Daily contact with patient to assess and evaluate symptoms and progress in treatment, Medication management and Plan Described  treatment plan with patient.  Strongly encouraged him to make sure he was eating and drinking adequately.  No change to current medicine.  Patient needs to focus mostly on getting himself physically well and planning on going to rehab.  We have made the referral to the alcohol and drug abuse treatment center and will await any news on that.  Encourage continued group participation no indication for now to change any specific medicine.  Mordecai RasmussenJohn Iveliz Garay, MD 10/18/2018, 3:21 PM

## 2018-10-18 NOTE — [facility] Group Notes (Signed)
[facility] Group Notes:  (Nursing/MHT/Case Management/Adjunct)  Date:  10/18/2018  Time:  9:45 PM  Type of Therapy:  Group Therapy  Participation Level:  Active  Participation Quality:  Appropriate  Affect:  Appropriate  Cognitive:  Appropriate  Insight:  Appropriate  Engagement in Group:  Engaged  Modes of Intervention:  Education  Summary of Progress/Problems: MHT informed patients of rules and expectations of the unit. MHT reviewed visitation and call hours. MHT informed patients they needed to provide their code to any one they wanted to contact them. MHT reminded patients that staff would not confirm or deny if they were on the unit if the person did not have the code. MHT encouraged patients not to take food to their rooms. MHT encouraged patients to clean up after themselves. MHT informed patients of routine checks and not to be alarmed if they see or hear techs opening their doors. MHT processed with patients about shift change at 7a and 7p. MHT encouraged patients to get what they need before report started or after it was completed. MHT informed patients not to give out personal information and to only use first names while on the unit. MHT discussed resources that were available in the community. Jinger Neighbors 10/18/2018, 9:45 PM

## 2018-10-18 NOTE — Progress Notes (Signed)
Recreation Therapy Notes  Date: 10/18/2018  Time: 9:30 am   Location: Craft room   Behavioral response: N/A   Intervention Topic: Problem Solving  Discussion/Intervention: Patient did not attend group.   Clinical Observations/Feedback:  Patient did not attend group.   Nechemia Chiappetta LRT/CTRS         Michal Strzelecki 10/18/2018 11:48 AM 

## 2018-10-18 NOTE — Plan of Care (Signed)
  Problem: Education: Goal: Ability to make informed decisions regarding treatment will improve Outcome: Progressing  Patient able to make informed decisions about treatment plans.

## 2018-10-18 NOTE — Progress Notes (Signed)
D- Patient alert and oriented. Patient presents in a sullen, depressed mood on assessment stating that he was up all night, with continued complaints of chronic pain in his hips and knees, rating his pain a "8/10", but he did not request any pain medication from this Clinical research associate. Patient rated his depression and anxiety a "9/10" stating that "trying to get over all of this" and "I got the shakes and everything" is why he's feeling this way. Patient denies SI, HI, AVH, at this time. Patient's goal for today is to "try to go to meetings, but I don't know if I can walk".  A- Scheduled medications administered to patient, per MD orders. Support and encouragement provided.  Routine safety checks conducted every 15 minutes.  Patient informed to notify staff with problems or concerns.  R- No adverse drug reactions noted. Patient contracts for safety at this time. Patient compliant with medications and treatment plan. Patient receptive, calm, and cooperative. Patient interacts well with others on the unit.  Patient remains safe at this time.

## 2018-10-19 LAB — GLUCOSE, CAPILLARY
Glucose-Capillary: 174 mg/dL — ABNORMAL HIGH (ref 70–99)
Glucose-Capillary: 123 mg/dL — ABNORMAL HIGH (ref 70–99)
Glucose-Capillary: 132 mg/dL — ABNORMAL HIGH (ref 70–99)
Glucose-Capillary: 129 mg/dL — ABNORMAL HIGH (ref 70–99)

## 2018-10-19 MED ORDER — MIDODRINE HCL 5 MG PO TABS
2.5000 mg | ORAL_TABLET | Freq: Three times a day (TID) | ORAL | Status: DC
  Administered 2018-10-19 – 2018-10-22 (×8): 2.5 mg via ORAL
  Filled 2018-10-19 (×11): qty 0.5

## 2018-10-19 MED ORDER — SERTRALINE HCL 100 MG PO TABS
100.0000 mg | ORAL_TABLET | Freq: Two times a day (BID) | ORAL | Status: DC
  Administered 2018-10-20 – 2018-10-28 (×17): 100 mg via ORAL
  Filled 2018-10-19 (×17): qty 1

## 2018-10-19 NOTE — Plan of Care (Signed)
Cooperative with treatment, pleasant and appreciative on approach, interacted well with peers and staff on shift. He was medication compliant. No w/d symptoms noted on shift thus far.

## 2018-10-19 NOTE — Progress Notes (Signed)
Recreation Therapy Notes   Date: 10/19/2018  Time: 9:30 am   Location: Craft room   Behavioral response: N/A   Intervention Topic: Time Management  Discussion/Intervention: Patient did not attend group.   Clinical Observations/Feedback:  Patient did not attend group.   Jacey Pelc LRT/CTRS         Eurydice Calixto 10/19/2018 11:41 AM 

## 2018-10-19 NOTE — [facility] Counselor (Signed)
CSW called to assess for status of referral to [facility] and [facility].  CSW spoke with Shayla, Intake Coordinator at [facility] who reports that there will be no bed openings until next week.  CSW spoke with Marcelline Deist at [facility] who reports that they are currently full.   Penni Homans, MSW, LCSW 10/19/2018 9:20 AM

## 2018-10-19 NOTE — [facility] Group Notes (Signed)
LCSW Group Therapy Note  10/19/2018 1:00 PM  Type of Therapy/Topic:  Group Therapy:  Balance in Life  Participation Level:  Did Not Attend  Description of Group:    This group will address the concept of balance and how it feels and looks when one is unbalanced. Patients will be encouraged to process areas in their lives that are out of balance and identify reasons for remaining unbalanced. Facilitators will guide patients in utilizing problem-solving interventions to address and correct the stressor making their life unbalanced. Understanding and applying boundaries will be explored and addressed for obtaining and maintaining a balanced life. Patients will be encouraged to explore ways to assertively make their unbalanced needs known to significant others in their lives, using other group members and facilitator for support and feedback.  Therapeutic Goals: 1. Patient will identify two or more emotions or situations they have that consume much of in their lives. 2. Patient will identify signs/triggers that life has become out of balance:  3. Patient will identify two ways to set boundaries in order to achieve balance in their lives:  4. Patient will demonstrate ability to communicate their needs through discussion and/or role plays  Summary of Patient Progress:  Therapeutic Modalities:   Cognitive Behavioral Therapy Solution-Focused Therapy Assertiveness Training  Penni Homans MSW, LCSW 10/19/2018 3:55 PM

## 2018-10-19 NOTE — Plan of Care (Signed)
Patient present in the milieu with unsteady gait. Patient's BP has been low today, po fluids provided and encouraged. MD made aware of the low BP and Midodrine ordered. Reports that he slept poor last night with the use of a sleep aid. Appetite fair, energy level low with poor concentration. Rates his depression an 8, anxiety an 8 and complains of dizziness. Patient encouraged to call for help wen needed. On coming shift will be notified of the aforementioned. Will continue to monitor.

## 2018-10-19 NOTE — Progress Notes (Signed)
[facility]  10/19/2018 8:49 PM Brynda Greathouseandall Georgeanna HarrisonMcDougal Harju Jr.  MRN:  [id number] Subjective: Follow-up for this patient with alcohol abuse and depression.  Continues to complain of feeling generally physically bad.  It is true that his blood pressure continues to run quite low even though he is eating.  He gets orthostatic easily.  Patient complains of not having his Klonopin but does not look overly anxious or jumpy.  No sign of withdrawal or delirium.  Mood is depressed but not suicidal. Principal Problem: Alcohol dependence (HCC) Diagnosis: Principal Problem:   Alcohol dependence (HCC) Active Problems:   MDD (major depressive disorder)   GAD (generalized anxiety disorder)   Depression  Total Time spent with patient: 30 minutes  Past Psychiatric History: Past history of longstanding alcohol abuse and poor compliance with outpatient treatment as well as treatment for depression  Past Medical History:  Past Medical History:  Diagnosis Date  . Anxiety   . DDD (degenerative disc disease)   . Depression   . GERD (gastroesophageal reflux disease)   . Hyperlipidemia   . Hypertension   . Psoriasis   . Substance abuse (HCC)   . Substance induced mood disorder (HCC)   . Varicose vein     Past Surgical History:  Procedure Laterality Date  . CHOLECYSTECTOMY    . SINUSOTOMY    . VEIN LIGATION AND STRIPPING     Family History:  Family History  Problem Relation Age of Onset  . Depression Mother   . Anxiety disorder Mother   . Cancer Father    Family Psychiatric  History: None Social History:  Social History   Substance and Sexual Activity  Alcohol Use Yes  . Alcohol/week: 1.0 standard drinks  . Types: 1 Cans of beer per week   Comment: bi-weekly     Social History   Substance and Sexual Activity  Drug Use No    Social History   Socioeconomic History  . Marital status: Divorced    Spouse name: Not on file  . Number of children: Not on file  . Years of  education: Not on file  . Highest education level: Not on file  Occupational History  . Not on file  Social Needs  . Financial resource strain: Not on file  . Food insecurity:    Worry: Not on file    Inability: Not on file  . Transportation needs:    Medical: Not on file    Non-medical: Not on file  Tobacco Use  . Smoking status: Current Every Day Smoker    Packs/day: 1.00    Types: Cigarettes  . Smokeless tobacco: Never Used  Substance and Sexual Activity  . Alcohol use: Yes    Alcohol/week: 1.0 standard drinks    Types: 1 Cans of beer per week    Comment: bi-weekly  . Drug use: No  . Sexual activity: Not on file  Lifestyle  . Physical activity:    Days per week: Not on file    Minutes per session: Not on file  . Stress: Not on file  Relationships  . Social connections:    Talks on phone: Not on file    Gets together: Not on file    Attends religious service: Not on file    Active member of club or organization: Not on file    Attends meetings of clubs or organizations: Not on file    Relationship status: Not on file  Other Topics Concern  . Not  on file  Social History Narrative  . Not on file   Additional Social History:                         Sleep: Fair  Appetite:  Fair  Current Medications: Current Facility-Administered Medications  Medication Dose Route Frequency Provider Last Rate Last Dose  . acetaminophen (TYLENOL) tablet 650 mg  650 mg Oral Q6H PRN He, Jun, MD   650 mg at 10/17/18 2213  . alum & mag hydroxide-simeth (MAALOX/MYLANTA) 200-200-20 MG/5ML suspension 30 mL  30 mL Oral Q4H PRN He, Jun, MD      . atorvastatin (LIPITOR) tablet 80 mg  80 mg Oral Daily He, Jun, MD   80 mg at 10/19/18 1807  . folic acid (FOLVITE) tablet 1 mg  1 mg Oral Daily He, Jun, MD   1 mg at 10/19/18 0829  . insulin aspart (novoLOG) injection 0-5 Units  0-5 Units Subcutaneous QHS He, Jun, MD      . insulin aspart (novoLOG) injection 0-9 Units  0-9 Units  Subcutaneous TID WC He, Jun, MD   1 Units at 10/19/18 1614  . magnesium hydroxide (MILK OF MAGNESIA) suspension 30 mL  30 mL Oral Daily PRN He, Jun, MD   30 mL at 10/19/18 1614  . metFORMIN (GLUCOPHAGE) tablet 1,000 mg  1,000 mg Oral BID WC He, Jun, MD   1,000 mg at 10/19/18 1614  . midodrine (PROAMATINE) tablet 2.5 mg  2.5 mg Oral TID WC Amylia Collazos T, MD   2.5 mg at 10/19/18 1831  . multivitamin with minerals tablet 1 tablet  1 tablet Oral Daily He, Jun, MD   1 tablet at 10/19/18 0837  . nicotine (NICODERM CQ - dosed in mg/24 hours) patch 21 mg  21 mg Transdermal Daily He, Jun, MD   21 mg at 10/19/18 0815  . [START ON 10/20/2018] sertraline (ZOLOFT) tablet 100 mg  100 mg Oral BID Desree Leap T, MD      . thiamine (VITAMIN B-1) tablet 100 mg  100 mg Oral Daily He, Jun, MD   100 mg at 10/19/18 0829  . traZODone (DESYREL) tablet 100 mg  100 mg Oral QHS Kao Conry T, MD   100 mg at 10/18/18 2106    Lab Results:  Results for orders placed or performed during the hospital encounter of 10/14/18 (from the past 48 hour(s))  Glucose, capillary     Status: Abnormal   Collection Time: 10/17/18 10:10 PM  Result Value Ref Range   Glucose-Capillary 140 (H) 70 - 99 mg/dL  Glucose, capillary     Status: Abnormal   Collection Time: 10/18/18  6:52 AM  Result Value Ref Range   Glucose-Capillary 153 (H) 70 - 99 mg/dL   Comment 1 Notify RN   Glucose, capillary     Status: Abnormal   Collection Time: 10/18/18  9:07 AM  Result Value Ref Range   Glucose-Capillary 176 (H) 70 - 99 mg/dL  Glucose, capillary     Status: Abnormal   Collection Time: 10/18/18 12:06 PM  Result Value Ref Range   Glucose-Capillary 115 (H) 70 - 99 mg/dL  Glucose, capillary     Status: Abnormal   Collection Time: 10/18/18  4:48 PM  Result Value Ref Range   Glucose-Capillary 115 (H) 70 - 99 mg/dL   Comment 1 Notify RN   Glucose, capillary     Status: None   Collection Time: 10/18/18  9:06 PM  Result Value Ref Range    Glucose-Capillary 97 70 - 99 mg/dL  Glucose, capillary     Status: Abnormal   Collection Time: 10/19/18  7:12 AM  Result Value Ref Range   Glucose-Capillary 174 (H) 70 - 99 mg/dL   Comment 1 Notify RN   Glucose, capillary     Status: Abnormal   Collection Time: 10/19/18 11:16 AM  Result Value Ref Range   Glucose-Capillary 123 (H) 70 - 99 mg/dL  Glucose, capillary     Status: Abnormal   Collection Time: 10/19/18  4:12 PM  Result Value Ref Range   Glucose-Capillary 132 (H) 70 - 99 mg/dL  Glucose, capillary     Status: Abnormal   Collection Time: 10/19/18  8:42 PM  Result Value Ref Range   Glucose-Capillary 129 (H) 70 - 99 mg/dL   Comment 1 Notify RN     Blood Alcohol level:  Lab Results  Component Value Date   ETH <10 10/13/2018   ETH 298 (H) 08/01/2018    Metabolic Disorder Labs: Lab Results  Component Value Date   HGBA1C 5.6 10/15/2018   MPG 114.02 10/15/2018   MPG 120 (H) 08/08/2013   No results found for: PROLACTIN Lab Results  Component Value Date   CHOL 113 01/14/2018   TRIG 141 01/14/2018   HDL 16 (L) 01/14/2018   CHOLHDL 7.1 01/14/2018   VLDL 28 01/14/2018   LDLCALC 69 01/14/2018    Physical Findings: AIMS: Facial and Oral Movements Muscles of Facial Expression: None, normal Lips and Perioral Area: None, normal Jaw: None, normal Tongue: None, normal,Extremity Movements Upper (arms, wrists, hands, fingers): None, normal Lower (legs, knees, ankles, toes): None, normal, Trunk Movements Neck, shoulders, hips: None, normal, Overall Severity Severity of abnormal movements (highest score from questions above): None, normal Incapacitation due to abnormal movements: None, normal Patient's awareness of abnormal movements (rate only patient's report): No Awareness, Dental Status Current problems with teeth and/or dentures?: No Does patient usually wear dentures?: No  CIWA:  CIWA-Ar Total: 1 COWS:     Musculoskeletal: Strength & Muscle Tone: decreased Gait &  Station: unsteady Patient leans: N/A  Psychiatric Specialty Exam: Physical Exam  Nursing note and vitals reviewed. Constitutional: He appears well-developed and well-nourished.  HENT:  Head: Normocephalic and atraumatic.  Eyes: Pupils are equal, round, and reactive to light. Conjunctivae are normal.  Neck: Normal range of motion.  Cardiovascular: Regular rhythm and normal heart sounds.  Respiratory: Effort normal. No respiratory distress.  GI: Soft.  Musculoskeletal: Normal range of motion.  Neurological: He is alert.  Skin: Skin is warm and dry.  Psychiatric: His affect is blunt. His speech is delayed. He is slowed. Thought content is not paranoid. He expresses impulsivity. He expresses no homicidal and no suicidal ideation. He exhibits abnormal recent memory.    Review of Systems  Constitutional: Negative.   HENT: Negative.   Eyes: Negative.   Respiratory: Negative.   Cardiovascular: Negative.   Gastrointestinal: Negative.   Musculoskeletal: Negative.   Skin: Negative.   Neurological: Negative.   Psychiatric/Behavioral: Positive for depression and memory loss. Negative for hallucinations, substance abuse and suicidal ideas. The patient is nervous/anxious and has insomnia.     Blood pressure 99/62, pulse 73, temperature 98.6 F (37 C), temperature source Oral, resp. rate 18, height 6\' 2"  (1.88 m), weight 102.5 kg, SpO2 98 %.Body mass index is 29.02 kg/m.  General Appearance: Disheveled  Eye Contact:  Fair  Speech:  Slow  Volume:  Decreased  Mood:  Dysphoric  Affect:  Congruent  Thought Process:  Coherent  Orientation:  Full (Time, Place, and Person)  Thought Content:  Illogical  Suicidal Thoughts:  No  Homicidal Thoughts:  No  Memory:  Immediate;   Fair Recent;   Fair Remote;   Fair  Judgement:  Fair  Insight:  Fair  Psychomotor Activity:  Decreased  Concentration:  Concentration: Fair  Recall:  [employer] of Knowledge:  Fair  Language:  Fair  Akathisia:  No   Handed:  Right  AIMS (if indicated):     Assets:  Desire for Improvement  ADL's:  Impaired  Cognition:  Impaired,  Mild  Sleep:  Number of Hours: 6.25     Treatment Plan Summary: Daily contact with patient to assess and evaluate symptoms and progress in treatment, Medication management and Plan Increase Zoloft to 100 twice a day which is his previous dose and he says had really been helpful for him.  Also adding some medicine to try and increase his blood pressure.  No need for any more detox or addition of sedatives.  Continue to wait for acceptance to [facility] if possible  Mordecai Rasmussen, MD 10/19/2018, 8:49 PM

## 2018-10-20 ENCOUNTER — Inpatient Hospital Stay: Payer: Medicare PPO

## 2018-10-20 LAB — GLUCOSE, CAPILLARY
GLUCOSE-CAPILLARY: 133 mg/dL — AB (ref 70–99)
Glucose-Capillary: 146 mg/dL — ABNORMAL HIGH (ref 70–99)
Glucose-Capillary: 153 mg/dL — ABNORMAL HIGH (ref 70–99)
Glucose-Capillary: 93 mg/dL (ref 70–99)

## 2018-10-20 NOTE — Tx Team (Signed)
Interdisciplinary Treatment and Diagnostic Plan Update  10/20/2018 Time of Session: 8:30AM Cory Foster Cory Foster. MRN: [id number]  Principal Diagnosis: Alcohol dependence (HCC)  Secondary Diagnoses: Principal Problem:   Alcohol dependence (HCC) Active Problems:   MDD (major depressive disorder)   GAD (generalized anxiety disorder)   Depression   Current Medications:  Current Facility-Administered Medications  Medication Dose Route Frequency Provider Last Rate Last Dose  . acetaminophen (TYLENOL) tablet 650 mg  650 mg Oral Q6H PRN He, Jun, MD   650 mg at 10/17/18 2213  . alum & mag hydroxide-simeth (MAALOX/MYLANTA) 200-200-20 MG/5ML suspension 30 mL  30 mL Oral Q4H PRN He, Jun, MD      . atorvastatin (LIPITOR) tablet 80 mg  80 mg Oral Daily He, Jun, MD   80 mg at 10/19/18 1807  . folic acid (FOLVITE) tablet 1 mg  1 mg Oral Daily He, Jun, MD   1 mg at 10/20/18 0802  . insulin aspart (novoLOG) injection 0-5 Units  0-5 Units Subcutaneous QHS He, Jun, MD      . insulin aspart (novoLOG) injection 0-9 Units  0-9 Units Subcutaneous TID WC He, Jun, MD   1 Units at 10/20/18 1125  . magnesium hydroxide (MILK OF MAGNESIA) suspension 30 mL  30 mL Oral Daily PRN He, Jun, MD   30 mL at 10/19/18 1614  . metFORMIN (GLUCOPHAGE) tablet 1,000 mg  1,000 mg Oral BID WC He, Jun, MD   1,000 mg at 10/20/18 0802  . midodrine (PROAMATINE) tablet 2.5 mg  2.5 mg Oral TID WC Clapacs, John T, MD   2.5 mg at 10/20/18 1124  . multivitamin with minerals tablet 1 tablet  1 tablet Oral Daily He, Jun, MD   1 tablet at 10/20/18 0802  . nicotine (NICODERM CQ - dosed in mg/24 hours) patch 21 mg  21 mg Transdermal Daily He, Jun, MD   21 mg at 10/20/18 0801  . sertraline (ZOLOFT) tablet 100 mg  100 mg Oral BID Clapacs, Jackquline Denmark, MD   100 mg at 10/20/18 0802  . thiamine (VITAMIN B-1) tablet 100 mg  100 mg Oral Daily He, Jun, MD   100 mg at 10/20/18 0803  . traZODone (DESYREL) tablet 100 mg  100 mg Oral QHS Clapacs, Jackquline Denmark,  MD   100 mg at 10/19/18 2216   PTA Medications: Medications Prior to Admission  Medication Sig Dispense Refill Last Dose  . atorvastatin (LIPITOR) 80 MG tablet Take 80 mg by mouth daily.    Past Month at Unknown time  . folic acid (FOLVITE) 1 MG tablet Take 1 tablet (1 mg total) by mouth daily. (Patient not taking: Reported on 03/07/2018) 30 tablet 1 Not Taking at Unknown time  . insulin glargine (LANTUS) 100 UNIT/ML injection Inject 0.1 mLs (10 Units total) into the skin at bedtime. (Patient taking differently: Inject 16 Units into the skin at bedtime as needed (BS). ) 10 mL 11 Past Month at Unknown time  . metFORMIN (GLUCOPHAGE) 500 MG tablet Take 1,000 mg by mouth 2 (two) times daily with a meal.    Past Month at Unknown time  . sertraline (ZOLOFT) 100 MG tablet Take 1 tablet (100 mg total) by mouth 2 (two) times daily. For depression/anxiety 60 tablet 0 Past Month at Unknown time  . thiamine 100 MG tablet Take 1 tablet (100 mg total) by mouth daily. (Patient not taking: Reported on 03/07/2018) 30 tablet 0 Not Taking at Unknown time    Patient Stressors: Surveyor, quantity  difficulties Legal issue Marital or family conflict Medication change or noncompliance  Patient Strengths: Capable of independent living MetallurgistCommunication skills Financial means General fund of knowledge  Treatment Modalities: Medication Management, Group therapy, Case management,  1 to 1 session with clinician, Psychoeducation, Recreational therapy.   Physician Treatment Plan for Primary Diagnosis: Alcohol dependence (HCC) Long Term Goal(s): Improvement in symptoms so as ready for discharge Improvement in symptoms so as ready for discharge   Short Term Goals: Ability to identify changes in lifestyle to reduce recurrence of condition will improve Ability to verbalize feelings will improve Ability to disclose and discuss suicidal ideas Ability to demonstrate self-control will improve Ability to identify and develop effective  coping behaviors will improve Ability to maintain clinical measurements within normal limits will improve Compliance with prescribed medications will improve Ability to identify triggers associated with substance abuse/mental health issues will improve Ability to identify changes in lifestyle to reduce recurrence of condition will improve Ability to verbalize feelings will improve Ability to disclose and discuss suicidal ideas Ability to demonstrate self-control will improve Ability to identify and develop effective coping behaviors will improve Ability to maintain clinical measurements within normal limits will improve Compliance with prescribed medications will improve Ability to identify triggers associated with substance abuse/mental health issues will improve  Medication Management: Evaluate patient's response, side effects, and tolerance of medication regimen.  Therapeutic Interventions: 1 to 1 sessions, Unit Group sessions and Medication administration.  Evaluation of Outcomes: Not Progressing  Physician Treatment Plan for Secondary Diagnosis: Principal Problem:   Alcohol dependence (HCC) Active Problems:   MDD (major depressive disorder)   GAD (generalized anxiety disorder)   Depression  Long Term Goal(s): Improvement in symptoms so as ready for discharge Improvement in symptoms so as ready for discharge   Short Term Goals: Ability to identify changes in lifestyle to reduce recurrence of condition will improve Ability to verbalize feelings will improve Ability to disclose and discuss suicidal ideas Ability to demonstrate self-control will improve Ability to identify and develop effective coping behaviors will improve Ability to maintain clinical measurements within normal limits will improve Compliance with prescribed medications will improve Ability to identify triggers associated with substance abuse/mental health issues will improve Ability to identify changes in  lifestyle to reduce recurrence of condition will improve Ability to verbalize feelings will improve Ability to disclose and discuss suicidal ideas Ability to demonstrate self-control will improve Ability to identify and develop effective coping behaviors will improve Ability to maintain clinical measurements within normal limits will improve Compliance with prescribed medications will improve Ability to identify triggers associated with substance abuse/mental health issues will improve     Medication Management: Evaluate patient's response, side effects, and tolerance of medication regimen.  Therapeutic Interventions: 1 to 1 sessions, Unit Group sessions and Medication administration.  Evaluation of Outcomes: Not Progressing   RN Treatment Plan for Primary Diagnosis: Alcohol dependence (HCC) Long Term Goal(s): Knowledge of disease and therapeutic regimen to maintain health will improve  Short Term Goals: Ability to demonstrate self-control, Ability to participate in decision making will improve, Ability to verbalize feelings will improve, Ability to identify and develop effective coping behaviors will improve and Compliance with prescribed medications will improve  Medication Management: RN will administer medications as ordered by provider, will assess and evaluate patient's response and provide education to patient for prescribed medication. RN will report any adverse and/or side effects to prescribing provider.  Therapeutic Interventions: 1 on 1 counseling sessions, Psychoeducation, Medication administration, Evaluate responses to treatment,  Monitor vital signs and CBGs as ordered, Perform/monitor CIWA, COWS, AIMS and Fall Risk screenings as ordered, Perform wound care treatments as ordered.  Evaluation of Outcomes: Not Progressing   LCSW Treatment Plan for Primary Diagnosis: Alcohol dependence (HCC) Long Term Goal(s): Safe transition to appropriate next level of care at discharge,  Engage patient in therapeutic group addressing interpersonal concerns.  Short Term Goals: Engage patient in aftercare planning with referrals and resources, Increase social support, Increase ability to appropriately verbalize feelings, Identify triggers associated with mental health/substance abuse issues and Increase skills for wellness and recovery  Therapeutic Interventions: Assess for all discharge needs, 1 to 1 time with Social worker, Explore available resources and support systems, Assess for adequacy in community support network, Educate family and significant other(s) on suicide prevention, Complete Psychosocial Assessment, Interpersonal group therapy.  Evaluation of Outcomes: Not Progressing   Progress in Treatment: Attending groups: No. Participating in groups: No. Taking medication as prescribed: Yes. Toleration medication: Yes. Family/Significant other contact made: Yes, individual(s) contacted:  SPE completed with pt's firend. Patient understands diagnosis: Yes. Discussing patient identified problems/goals with staff: Yes. Medical problems stabilized or resolved: No.  Patient reports that his blood pressure remains low. Denies suicidal/homicidal ideation: Yes. Issues/concerns per patient self-inventory: No. Other: none  New problem(s) identified: No, Describe:  none  New Short Term/Long Term Goal(s): detox, elimination of symptoms of psychosis, medication management for mood stabilization; elimination of SI thoughts; development of comprehensive mental wellness/sobriety plan.  Patient Goals:  "detox and stabilize medically.  I have high blood pressure and diabetes."  Discharge Plan or Barriers: Patient reports that he would like a residential program at this time.  Patient is aligned with outpatient if not approved for residential due to physical health limitations.  Reason for Continuation of Hospitalization: Anxiety Depression Medication stabilization Withdrawal  symptoms  Estimated Length of Stay: 1-5 days  Recreational Therapy: Patient Stressors: N/A  Patient Goal: Patient will identify 3 triggers for substance use within 5 recreation therapy group sessions  Attendees: Patient: Cory Foster 10/20/2018 3:51 PM  Physician: Dr. Toni Amend, MD 10/20/2018 3:51 PM  Nursing:  10/20/2018 3:51 PM  RN Care Manager: 10/20/2018 3:51 PM  Social Worker: Penni Homans, LCSW 10/20/2018 3:51 PM  Recreational Therapist:  10/20/2018 3:51 PM  Other:  10/20/2018 3:51 PM  Other:  10/20/2018 3:51 PM  Other: 10/20/2018 3:51 PM    Scribe for Treatment Team: Harden Mo, LCSW 10/20/2018 3:51 PM

## 2018-10-20 NOTE — Progress Notes (Signed)
Recreation Therapy Notes  Date:10/20/2018  Time:9:30 am  Location:Craft room  Behavioral response:N/A  Intervention Topic:Team work  Discussion/Intervention: Patient did not attend group.  Clinical Observations/Feedback:  Patient did not attend group.  Wilmer Berryhill LRT/CTRS          Warrick Llera 10/20/2018 10:27 AM 

## 2018-10-20 NOTE — Plan of Care (Signed)
Patient present in the milieu upon this writer's arrival to the unit. Patient sitting in a wheel chair, when asked why he was in the wheelchair he replied, "I can't hardly stand up and I feel weak. I almost fell this morning so they gave me a wheel chair." Patient compliant with his treatment plan. BP WNL but on the low side. When patient not in the milieu, he is observed resting in bed with his eyes closed. Patient educated on notifying staff when help is needed. Verbalized understanding the education provided to him.

## 2018-10-20 NOTE — [facility] Group Notes (Signed)
LCSW Group Therapy Note  10/20/2018 1:00 PM  Type of Therapy and Topic:  Group Therapy:  Feelings around Relapse and Recovery  Participation Level:  Did Not Attend   Description of Group:    Patients in this group will discuss emotions they experience before and after a relapse. They will process how experiencing these feelings, or avoidance of experiencing them, relates to having a relapse. Facilitator will guide patients to explore emotions they have related to recovery. Patients will be encouraged to process which emotions are more powerful. They will be guided to discuss the emotional reaction significant others in their lives may have to their relapse or recovery. Patients will be assisted in exploring ways to respond to the emotions of others without this contributing to a relapse.  Therapeutic Goals: 1. Patient will identify two or more emotions that lead to a relapse for them 2. Patient will identify two emotions that result when they relapse 3. Patient will identify two emotions related to recovery 4. Patient will demonstrate ability to communicate their needs through discussion and/or role plays   Summary of Patient Progress:     Therapeutic Modalities:   Cognitive Behavioral Therapy Solution-Focused Therapy Assertiveness Training Relapse Prevention Therapy   Penni Homans, MSW, LCSW 10/20/2018 2:19 PM

## 2018-10-20 NOTE — Progress Notes (Signed)
Patient alert and oriented x 4, denies SI/HI/AVH no distress noted, affect is flat appears less anxious, his thoughts are organized and coherent. Patient complained of feeling dizzy, writer checked VS and it was WNL, he later attended evening group and was appropriate. Patient was compliant witn evening medication regimen, and he was assisted back to bed. 15 minutes safety checks maintained will continue to monitor.

## 2018-10-20 NOTE — Progress Notes (Signed)
[facility] MD Progress Note  10/20/2018 4:40 PM Cory Foster Cory Foster.  MRN:  [id number] Subjective: Follow-up for this patient with alcohol dependence and depression.  Patient continues to look physically rundown and sick.  Very tired.  Barely gets up.  Blood pressure had been running low and is still running low even with the addition of some medicine to increase blood pressure.  He admits that he is coughing a little bit.  Feels a little bit sick but does not have a clear localizing symptom.  Feels depressed but mostly tired.  No suicidal thoughts.  Patient is not having any alcohol withdrawal symptoms Principal Problem: Alcohol dependence (HCC) Diagnosis: Principal Problem:   Alcohol dependence (HCC) Active Problems:   MDD (major depressive disorder)   GAD (generalized anxiety disorder)   Depression  Total Time spent with patient: 30 minutes  Past Psychiatric History: Long history of alcohol abuse and depression and anxiety  Past Medical History:  Past Medical History:  Diagnosis Date  . Anxiety   . DDD (degenerative disc disease)   . Depression   . GERD (gastroesophageal reflux disease)   . Hyperlipidemia   . Hypertension   . Psoriasis   . Substance abuse (HCC)   . Substance induced mood disorder (HCC)   . Varicose vein     Past Surgical History:  Procedure Laterality Date  . CHOLECYSTECTOMY    . SINUSOTOMY    . VEIN LIGATION AND STRIPPING     Family History:  Family History  Problem Relation Age of Onset  . Depression Mother   . Anxiety disorder Mother   . Cancer Father    Family Psychiatric  History: See previous notes Social History:  Social History   Substance and Sexual Activity  Alcohol Use Yes  . Alcohol/week: 1.0 standard drinks  . Types: 1 Cans of beer per week   Comment: bi-weekly     Social History   Substance and Sexual Activity  Drug Use No    Social History   Socioeconomic History  . Marital status: Divorced    Spouse name: Not on file   . Number of children: Not on file  . Years of education: Not on file  . Highest education level: Not on file  Occupational History  . Not on file  Social Needs  . Financial resource strain: Not on file  . Food insecurity:    Worry: Not on file    Inability: Not on file  . Transportation needs:    Medical: Not on file    Non-medical: Not on file  Tobacco Use  . Smoking status: Current Every Day Smoker    Packs/day: 1.00    Types: Cigarettes  . Smokeless tobacco: Never Used  Substance and Sexual Activity  . Alcohol use: Yes    Alcohol/week: 1.0 standard drinks    Types: 1 Cans of beer per week    Comment: bi-weekly  . Drug use: No  . Sexual activity: Not on file  Lifestyle  . Physical activity:    Days per week: Not on file    Minutes per session: Not on file  . Stress: Not on file  Relationships  . Social connections:    Talks on phone: Not on file    Gets together: Not on file    Attends religious service: Not on file    Active member of club or organization: Not on file    Attends meetings of clubs or organizations: Not on file  Relationship status: Not on file  Other Topics Concern  . Not on file  Social History Narrative  . Not on file   Additional Social History:                         Sleep: Fair  Appetite:  Fair  Current Medications: Current Facility-Administered Medications  Medication Dose Route Frequency Provider Last Rate Last Dose  . acetaminophen (TYLENOL) tablet 650 mg  650 mg Oral Q6H PRN He, Jun, MD   650 mg at 10/17/18 2213  . alum & mag hydroxide-simeth (MAALOX/MYLANTA) 200-200-20 MG/5ML suspension 30 mL  30 mL Oral Q4H PRN He, Jun, MD      . atorvastatin (LIPITOR) tablet 80 mg  80 mg Oral Daily He, Jun, MD   80 mg at 10/19/18 1807  . folic acid (FOLVITE) tablet 1 mg  1 mg Oral Daily He, Jun, MD   1 mg at 10/20/18 0802  . insulin aspart (novoLOG) injection 0-5 Units  0-5 Units Subcutaneous QHS He, Jun, MD      . insulin aspart  (novoLOG) injection 0-9 Units  0-9 Units Subcutaneous TID WC He, Jun, MD   2 Units at 10/20/18 1613  . magnesium hydroxide (MILK OF MAGNESIA) suspension 30 mL  30 mL Oral Daily PRN He, Jun, MD   30 mL at 10/19/18 1614  . metFORMIN (GLUCOPHAGE) tablet 1,000 mg  1,000 mg Oral BID WC He, Jun, MD   1,000 mg at 10/20/18 1613  . midodrine (PROAMATINE) tablet 2.5 mg  2.5 mg Oral TID WC Clapacs, John T, MD   2.5 mg at 10/20/18 1613  . multivitamin with minerals tablet 1 tablet  1 tablet Oral Daily He, Jun, MD   1 tablet at 10/20/18 0802  . nicotine (NICODERM CQ - dosed in mg/24 hours) patch 21 mg  21 mg Transdermal Daily He, Jun, MD   21 mg at 10/20/18 0801  . sertraline (ZOLOFT) tablet 100 mg  100 mg Oral BID Clapacs, Jackquline Denmark, MD   100 mg at 10/20/18 1613  . thiamine (VITAMIN B-1) tablet 100 mg  100 mg Oral Daily He, Jun, MD   100 mg at 10/20/18 0803  . traZODone (DESYREL) tablet 100 mg  100 mg Oral QHS Clapacs, John T, MD   100 mg at 10/19/18 2216    Lab Results:  Results for orders placed or performed during the hospital encounter of 10/14/18 (from the past 48 hour(s))  Glucose, capillary     Status: Abnormal   Collection Time: 10/18/18  4:48 PM  Result Value Ref Range   Glucose-Capillary 115 (H) 70 - 99 mg/dL   Comment 1 Notify RN   Glucose, capillary     Status: None   Collection Time: 10/18/18  9:06 PM  Result Value Ref Range   Glucose-Capillary 97 70 - 99 mg/dL  Glucose, capillary     Status: Abnormal   Collection Time: 10/19/18  7:12 AM  Result Value Ref Range   Glucose-Capillary 174 (H) 70 - 99 mg/dL   Comment 1 Notify RN   Glucose, capillary     Status: Abnormal   Collection Time: 10/19/18 11:16 AM  Result Value Ref Range   Glucose-Capillary 123 (H) 70 - 99 mg/dL  Glucose, capillary     Status: Abnormal   Collection Time: 10/19/18  4:12 PM  Result Value Ref Range   Glucose-Capillary 132 (H) 70 - 99 mg/dL  Glucose, capillary  Status: Abnormal   Collection Time: 10/19/18  8:42  PM  Result Value Ref Range   Glucose-Capillary 129 (H) 70 - 99 mg/dL   Comment 1 Notify RN   Glucose, capillary     Status: Abnormal   Collection Time: 10/20/18  7:09 AM  Result Value Ref Range   Glucose-Capillary 146 (H) 70 - 99 mg/dL   Comment 1 Notify RN   Glucose, capillary     Status: Abnormal   Collection Time: 10/20/18 11:23 AM  Result Value Ref Range   Glucose-Capillary 133 (H) 70 - 99 mg/dL  Glucose, capillary     Status: Abnormal   Collection Time: 10/20/18  4:09 PM  Result Value Ref Range   Glucose-Capillary 153 (H) 70 - 99 mg/dL   Comment 1 Notify RN    Comment 2 Document in Chart     Blood Alcohol level:  Lab Results  Component Value Date   ETH <10 10/13/2018   ETH 298 (H) 08/01/2018    Metabolic Disorder Labs: Lab Results  Component Value Date   HGBA1C 5.6 10/15/2018   MPG 114.02 10/15/2018   MPG 120 (H) 08/08/2013   No results found for: PROLACTIN Lab Results  Component Value Date   CHOL 113 01/14/2018   TRIG 141 01/14/2018   HDL 16 (L) 01/14/2018   CHOLHDL 7.1 01/14/2018   VLDL 28 01/14/2018   LDLCALC 69 01/14/2018    Physical Findings: AIMS: Facial and Oral Movements Muscles of Facial Expression: None, normal Lips and Perioral Area: None, normal Jaw: None, normal Tongue: None, normal,Extremity Movements Upper (arms, wrists, hands, fingers): None, normal Lower (legs, knees, ankles, toes): None, normal, Trunk Movements Neck, shoulders, hips: None, normal, Overall Severity Severity of abnormal movements (highest score from questions above): None, normal Incapacitation due to abnormal movements: None, normal Patient's awareness of abnormal movements (rate only patient's report): No Awareness, Dental Status Current problems with teeth and/or dentures?: No Does patient usually wear dentures?: No  CIWA:  CIWA-Ar Total: 4 COWS:     Musculoskeletal: Strength & Muscle Tone: decreased Gait & Station: unsteady Patient leans: N/A  Psychiatric  Specialty Exam: Physical Exam  Nursing note and vitals reviewed. Constitutional: He appears well-developed and well-nourished.  HENT:  Head: Normocephalic and atraumatic.  Eyes: Pupils are equal, round, and reactive to light. Conjunctivae are normal.  Neck: Normal range of motion.  Cardiovascular: Normal heart sounds.  Respiratory: Effort normal.  GI: Soft.  Musculoskeletal: Normal range of motion.  Neurological: He is alert.  Skin: Skin is warm and dry.  Psychiatric: Judgment normal. His affect is blunt. His speech is delayed. He is slowed. Cognition and memory are impaired. He expresses no suicidal ideation.    Review of Systems  Constitutional: Positive for malaise/fatigue.  HENT: Negative.   Eyes: Negative.   Respiratory: Negative.   Cardiovascular: Negative.   Gastrointestinal: Negative.   Musculoskeletal: Negative.   Skin: Negative.   Neurological: Negative.   Psychiatric/Behavioral: Positive for depression. Negative for hallucinations, memory loss, substance abuse and suicidal ideas. The patient is nervous/anxious and has insomnia.     Blood pressure 102/71, pulse 73, temperature 97.9 F (36.6 C), temperature source Oral, resp. rate 18, height 6\' 2"  (1.88 m), weight 102.5 kg, SpO2 97 %.Body mass index is 29.02 kg/m.  General Appearance: Disheveled  Eye Contact:  Fair  Speech:  Slow  Volume:  Decreased  Mood:  Depressed  Affect:  Congruent  Thought Process:  Coherent  Orientation:  Full (Time, Place, and  Person)  Thought Content:  Logical  Suicidal Thoughts:  No  Homicidal Thoughts:  No  Memory:  Immediate;   Fair Recent;   Fair Remote;   Fair  Judgement:  Fair  Insight:  Fair  Psychomotor Activity:  Decreased  Concentration:  Concentration: Fair  Recall:  [employer]Fair  Fund of Knowledge:  Fair  Language:  Fair  Akathisia:  No  Handed:  Right  AIMS (if indicated):     Assets:  Desire for Improvement  ADL's:  Impaired  Cognition:  Impaired,  Mild  Sleep:  Number  of Hours: 7     Treatment Plan Summary: Daily contact with patient to assess and evaluate symptoms and progress in treatment, Medication management and Plan Patient looks physically sick more than anything.  Possible viral syndrome.  I am going to get a chest x-ray also as he does complain of a little shortness of breath even though he does not look like he is coughing very hard.  Encourage patient to make sure he is eating and drinking well and getting up and active.  I did increase his antidepressant medicine as well.  Mordecai RasmussenJohn Clapacs, MD 10/20/2018, 4:40 PM

## 2018-10-21 LAB — GLUCOSE, CAPILLARY
GLUCOSE-CAPILLARY: 110 mg/dL — AB (ref 70–99)
Glucose-Capillary: 143 mg/dL — ABNORMAL HIGH (ref 70–99)
Glucose-Capillary: 120 mg/dL — ABNORMAL HIGH (ref 70–99)
Glucose-Capillary: 115 mg/dL — ABNORMAL HIGH (ref 70–99)

## 2018-10-21 MED ORDER — MELOXICAM 7.5 MG PO TABS
15.0000 mg | ORAL_TABLET | Freq: Every day | ORAL | Status: DC
  Administered 2018-10-21 – 2018-10-28 (×8): 15 mg via ORAL
  Filled 2018-10-21 (×8): qty 2

## 2018-10-21 MED ORDER — IBUPROFEN 600 MG PO TABS
600.0000 mg | ORAL_TABLET | Freq: Once | ORAL | Status: AC
  Administered 2018-10-21: 600 mg via ORAL
  Filled 2018-10-21: qty 1

## 2018-10-21 NOTE — Plan of Care (Signed)
Patient present in the milieu, selective interaction noted. Ambulating the unit with a steady gait. Denies feeling dizzy today, patient showered and washed his clothes. Compliant with treatment plan. Meals eaten in the dining hall. BS monitored and treated based on his sliding scale. Denies SI/HI/AVH and pain at this time. Milieu remains safe with q 15 minute safety checks.

## 2018-10-21 NOTE — Progress Notes (Signed)
D: patient denies SI, HI and AV hallucinations. Affect flat. Mood sad. Reporting pain 8/10 in both knees. Medicated per prn order. An hour later, still reporting pain 8/10 in both knees. Called Dr. Leighton Roach and Ibuprofen 600 mg x1 given per order.  A: continue to monitor for safety and offer support R: safety maintained. No other complaints of pain

## 2018-10-21 NOTE — Plan of Care (Signed)
Cooperative with treatment, med compliant, he reports feeling shaking but patient has no other symptoms noted by Clinical research associate. He is currently in bed resting at this time.

## 2018-10-21 NOTE — Plan of Care (Signed)
D: patient denies SI, HI and AV hallucinations. Affect flat. Mood sad.  A: continue to monitor for safety and offer support R: safety maintained

## 2018-10-21 NOTE — [facility] Group Notes (Signed)
LCSW Group Therapy Note  10/21/2018 1:15pm  Type of Therapy and Topic:  Group Therapy:  Cognitive Distortions  Participation Level:  Active   Description of Group:    Patients in this group will be introduced to the topic of cognitive distortions.  Patients will identify and describe cognitive distortions, describe the feelings these distortions create for them.  Patients will identify one or more situations in their personal life where they have cognitively distorted thinking and will verbalize challenging this cognitive distortion through positive thinking skills.  Patients will practice the skill of using positive affirmations to challenge cognitive distortions using affirmation cards.    Therapeutic Goals:  1. Patient will identify two or more cognitive distortions they have used 2. Patient will identify one or more emotions that stem from use of a cognitive distortion 3. Patient will demonstrate use of a positive affirmation to counter a cognitive distortion through discussion and/or role play. 4. Patient will describe one way cognitive distortions can be detrimental to wellness   Summary of Patient Progress: The patient scored his mood at a 3 (10 best.) Patients were introduced to the topic of cognitive distortions. The patient was able to identify and describe cognitive distortions, described the feelings these distortions create for him. Patient identified a situation in his/her personal life where he has cognitively distorted thinking and was able to verbalize and challenged this cognitive distortion through positive thinking skills. Patient was able to provide support and validation to other group members.     Therapeutic Modalities:   Cognitive Behavioral Therapy Motivational Interviewing   Johnnye Sima, LCSW 10/21/2018 12:31 PM

## 2018-10-21 NOTE — Progress Notes (Signed)
[facility]  10/21/2018 11:47 AM Cory Greathouseandall Georgeanna HarrisonMcDougal Beaubrun Jr.  MRN:  [id number] Subjective: Follow-up patient with alcohol abuse and depression.  Continues to complain of feeling dizzy and off balance and of his knees hurting.  Denies suicidal intent or plan but mood is depressed. Principal Problem: Alcohol dependence (HCC) Diagnosis: Principal Problem:   Alcohol dependence (HCC) Active Problems:   MDD (major depressive disorder)   GAD (generalized anxiety disorder)   Depression  Total Time spent with patient: 20 minutes  Past Psychiatric History: Patient has a history of alcohol abuse and depression  Past Medical History:  Past Medical History:  Diagnosis Date  . Anxiety   . DDD (degenerative disc disease)   . Depression   . GERD (gastroesophageal reflux disease)   . Hyperlipidemia   . Hypertension   . Psoriasis   . Substance abuse (HCC)   . Substance induced mood disorder (HCC)   . Varicose vein     Past Surgical History:  Procedure Laterality Date  . CHOLECYSTECTOMY    . SINUSOTOMY    . VEIN LIGATION AND STRIPPING     Family History:  Family History  Problem Relation Age of Onset  . Depression Mother   . Anxiety disorder Mother   . Cancer Father    Family Psychiatric  History: See previous notes Social History:  Social History   Substance and Sexual Activity  Alcohol Use Yes  . Alcohol/week: 1.0 standard drinks  . Types: 1 Cans of beer per week   Comment: bi-weekly     Social History   Substance and Sexual Activity  Drug Use No    Social History   Socioeconomic History  . Marital status: Divorced    Spouse name: Not on file  . Number of children: Not on file  . Years of education: Not on file  . Highest education level: Not on file  Occupational History  . Not on file  Social Needs  . Financial resource strain: Not on file  . Food insecurity:    Worry: Not on file    Inability: Not on file  . Transportation needs:    Medical: Not  on file    Non-medical: Not on file  Tobacco Use  . Smoking status: Current Every Day Smoker    Packs/day: 1.00    Types: Cigarettes  . Smokeless tobacco: Never Used  Substance and Sexual Activity  . Alcohol use: Yes    Alcohol/week: 1.0 standard drinks    Types: 1 Cans of beer per week    Comment: bi-weekly  . Drug use: No  . Sexual activity: Not on file  Lifestyle  . Physical activity:    Days per week: Not on file    Minutes per session: Not on file  . Stress: Not on file  Relationships  . Social connections:    Talks on phone: Not on file    Gets together: Not on file    Attends religious service: Not on file    Active member of club or organization: Not on file    Attends meetings of clubs or organizations: Not on file    Relationship status: Not on file  Other Topics Concern  . Not on file  Social History Narrative  . Not on file   Additional Social History:                         Sleep: Fair  Appetite:  Fair  Current Medications: Current Facility-Administered Medications  Medication Dose Route Frequency Provider Last Rate Last Dose  . acetaminophen (TYLENOL) tablet 650 mg  650 mg Oral Q6H PRN He, Jun, MD   650 mg at 10/17/18 2213  . alum & mag hydroxide-simeth (MAALOX/MYLANTA) 200-200-20 MG/5ML suspension 30 mL  30 mL Oral Q4H PRN He, Jun, MD      . atorvastatin (LIPITOR) tablet 80 mg  80 mg Oral Daily He, Jun, MD   80 mg at 10/20/18 1709  . folic acid (FOLVITE) tablet 1 mg  1 mg Oral Daily He, Jun, MD   1 mg at 10/21/18 0836  . insulin aspart (novoLOG) injection 0-5 Units  0-5 Units Subcutaneous QHS He, Jun, MD      . insulin aspart (novoLOG) injection 0-9 Units  0-9 Units Subcutaneous TID WC He, Jun, MD   1 Units at 10/21/18 0835  . magnesium hydroxide (MILK OF MAGNESIA) suspension 30 mL  30 mL Oral Daily PRN He, Jun, MD   30 mL at 10/19/18 1614  . meloxicam (MOBIC) tablet 15 mg  15 mg Oral Daily Laurna Shetley, Jackquline DenmarkJohn T, MD   15 mg at 10/21/18 0853  .  metFORMIN (GLUCOPHAGE) tablet 1,000 mg  1,000 mg Oral BID WC He, Jun, MD   1,000 mg at 10/21/18 0836  . midodrine (PROAMATINE) tablet 2.5 mg  2.5 mg Oral TID WC Joakim Huesman T, MD   2.5 mg at 10/21/18 [id number]  . multivitamin with minerals tablet 1 tablet  1 tablet Oral Daily He, Jun, MD   1 tablet at 10/21/18 0836  . nicotine (NICODERM CQ - dosed in mg/24 hours) patch 21 mg  21 mg Transdermal Daily He, Jun, MD   21 mg at 10/21/18 0830  . sertraline (ZOLOFT) tablet 100 mg  100 mg Oral BID Oluwanifemi Susman, Jackquline DenmarkJohn T, MD   100 mg at 10/21/18 0836  . thiamine (VITAMIN B-1) tablet 100 mg  100 mg Oral Daily He, Jun, MD   100 mg at 10/21/18 0837  . traZODone (DESYREL) tablet 100 mg  100 mg Oral QHS Emmary Culbreath, Jackquline DenmarkJohn T, MD   100 mg at 10/20/18 2133    Lab Results:  Results for orders placed or performed during the hospital encounter of 10/14/18 (from the past 48 hour(s))  Glucose, capillary     Status: Abnormal   Collection Time: 10/19/18  4:12 PM  Result Value Ref Range   Glucose-Capillary 132 (H) 70 - 99 mg/dL  Glucose, capillary     Status: Abnormal   Collection Time: 10/19/18  8:42 PM  Result Value Ref Range   Glucose-Capillary 129 (H) 70 - 99 mg/dL   Comment 1 Notify RN   Glucose, capillary     Status: Abnormal   Collection Time: 10/20/18  7:09 AM  Result Value Ref Range   Glucose-Capillary 146 (H) 70 - 99 mg/dL   Comment 1 Notify RN   Glucose, capillary     Status: Abnormal   Collection Time: 10/20/18 11:23 AM  Result Value Ref Range   Glucose-Capillary 133 (H) 70 - 99 mg/dL  Glucose, capillary     Status: Abnormal   Collection Time: 10/20/18  4:09 PM  Result Value Ref Range   Glucose-Capillary 153 (H) 70 - 99 mg/dL   Comment 1 Notify RN    Comment 2 Document in Chart   Glucose, capillary     Status: None   Collection Time: 10/20/18  8:33 PM  Result Value Ref Range   Glucose-Capillary  93 70 - 99 mg/dL   Comment 1 Notify RN   Glucose, capillary     Status: Abnormal   Collection Time: 10/21/18   7:03 AM  Result Value Ref Range   Glucose-Capillary 143 (H) 70 - 99 mg/dL   Comment 1 Notify RN   Glucose, capillary     Status: Abnormal   Collection Time: 10/21/18 11:43 AM  Result Value Ref Range   Glucose-Capillary 120 (H) 70 - 99 mg/dL    Blood Alcohol level:  Lab Results  Component Value Date   ETH <10 10/13/2018   ETH 298 (H) 08/01/2018    Metabolic Disorder Labs: Lab Results  Component Value Date   HGBA1C 5.6 10/15/2018   MPG 114.02 10/15/2018   MPG 120 (H) 08/08/2013   No results found for: PROLACTIN Lab Results  Component Value Date   CHOL 113 01/14/2018   TRIG 141 01/14/2018   HDL 16 (L) 01/14/2018   CHOLHDL 7.1 01/14/2018   VLDL 28 01/14/2018   LDLCALC 69 01/14/2018    Physical Findings: AIMS: Facial and Oral Movements Muscles of Facial Expression: None, normal Lips and Perioral Area: None, normal Jaw: None, normal Tongue: None, normal,Extremity Movements Upper (arms, wrists, hands, fingers): None, normal Lower (legs, knees, ankles, toes): None, normal, Trunk Movements Neck, shoulders, hips: None, normal, Overall Severity Severity of abnormal movements (highest score from questions above): None, normal Incapacitation due to abnormal movements: None, normal Patient's awareness of abnormal movements (rate only patient's report): No Awareness, Dental Status Current problems with teeth and/or dentures?: No Does patient usually wear dentures?: No  CIWA:  CIWA-Ar Total: 2 COWS:     Musculoskeletal: Strength & Muscle Tone: within normal limits Gait & Station: normal Patient leans: N/A  Psychiatric Specialty Exam: Physical Exam  Nursing note and vitals reviewed. Constitutional: He appears well-developed and well-nourished.  HENT:  Head: Normocephalic and atraumatic.  Eyes: Pupils are equal, round, and reactive to light. Conjunctivae are normal.  Neck: Normal range of motion.  Cardiovascular: Regular rhythm and normal heart sounds.  Respiratory:  Effort normal.  GI: Soft.  Musculoskeletal: Normal range of motion.  Neurological: He is alert.  Skin: Skin is warm and dry.  Psychiatric: Judgment normal. His affect is blunt. His speech is delayed. He is slowed. He expresses no suicidal ideation. He exhibits abnormal recent memory.    Review of Systems  Constitutional: Negative.   HENT: Negative.   Eyes: Negative.   Respiratory: Negative.   Cardiovascular: Negative.   Gastrointestinal: Negative.   Musculoskeletal: Positive for joint pain.  Skin: Negative.   Neurological: Negative.   Psychiatric/Behavioral: Positive for depression. Negative for hallucinations, memory loss, substance abuse and suicidal ideas. The patient is nervous/anxious. The patient does not have insomnia.     Blood pressure 106/74, pulse 81, temperature 98.2 F (36.8 C), temperature source Oral, resp. rate 18, height 6\' 2"  (1.88 m), weight 102.5 kg, SpO2 95 %.Body mass index is 29.02 kg/m.  General Appearance: Casual  Eye Contact:  Fair  Speech:  Slow  Volume:  Decreased  Mood:  Depressed  Affect:  Congruent  Thought Process:  Goal Directed  Orientation:  Full (Time, Place, and Person)  Thought Content:  Logical  Suicidal Thoughts:  No  Homicidal Thoughts:  No  Memory:  Immediate;   Fair Recent;   Fair Remote;   Fair  Judgement:  Impaired  Insight:  Fair  Psychomotor Activity:  Decreased  Concentration:  Concentration: Fair  Recall:  [employer]  of Knowledge:  Fair  Language:  Fair  Akathisia:  No  Handed:  Right  AIMS (if indicated):     Assets:  Desire for Improvement Resilience  ADL's:  Impaired  Cognition:  Impaired,  Mild  Sleep:  Number of Hours: 7     Treatment Plan Summary: Daily contact with patient to assess and evaluate symptoms and progress in treatment, Medication management and Plan Overall I think he is getting better.  Less withdrawn and appears to be less fatigued.  Not reporting any suicidal ideation.  Getting out of his  room a little better.  Better hygiene.  Mordecai Rasmussen, MD 10/21/2018, 11:47 AM

## 2018-10-22 LAB — GLUCOSE, CAPILLARY
GLUCOSE-CAPILLARY: 133 mg/dL — AB (ref 70–99)
Glucose-Capillary: 138 mg/dL — ABNORMAL HIGH (ref 70–99)
Glucose-Capillary: 146 mg/dL — ABNORMAL HIGH (ref 70–99)
Glucose-Capillary: 123 mg/dL — ABNORMAL HIGH (ref 70–99)

## 2018-10-22 MED ORDER — TRAZODONE HCL 50 MG PO TABS
50.0000 mg | ORAL_TABLET | Freq: Every day | ORAL | Status: DC
  Administered 2018-10-22 – 2018-10-27 (×6): 50 mg via ORAL
  Filled 2018-10-22 (×6): qty 1

## 2018-10-22 MED ORDER — MIDODRINE HCL 5 MG PO TABS
5.0000 mg | ORAL_TABLET | Freq: Three times a day (TID) | ORAL | Status: DC
  Administered 2018-10-22 – 2018-10-28 (×19): 5 mg via ORAL
  Filled 2018-10-22 (×20): qty 1

## 2018-10-22 NOTE — Plan of Care (Signed)
Patient verbalizes understanding of the general information that's been provided to him and all questions and concerns have been addressed and answered at this time. Patient rated his depression and anxiety an "8/10" stating that "just life" and "anxiety attacks" is why he's feeling this way. Patient denies SI/HI/AVH to this Clinical research associate. Patient has been present in the milieu interacting well with staff and other members on the unit without any issues thus far. Patient has the ability to make informed decisions and has been in compliance with his prescribed therapeutic regimen. Patient has been free from injury and remains safe on the unit at this time.  Problem: Education: Goal: Knowledge of [facility] Education information/materials will improve Outcome: Progressing Goal: Emotional status will improve Outcome: Progressing Goal: Mental status will improve Outcome: Progressing   Problem: Activity: Goal: Interest or engagement in activities will improve Outcome: Progressing Goal: Sleeping patterns will improve Outcome: Progressing   Problem: Coping: Goal: Ability to verbalize frustrations and anger appropriately will improve Outcome: Progressing Goal: Ability to demonstrate self-control will improve Outcome: Progressing   Problem: Safety: Goal: Periods of time without injury will increase Outcome: Progressing   Problem: Education: Goal: Ability to make informed decisions regarding treatment will improve Outcome: Progressing   Problem: Coping: Goal: Coping ability will improve Outcome: Progressing   Problem: Health Behavior/Discharge Planning: Goal: Identification of resources available to assist in meeting health care needs will improve Outcome: Progressing   Problem: Medication: Goal: Compliance with prescribed medication regimen will improve Outcome: Progressing   Problem: Self-Concept: Goal: Ability to disclose and discuss suicidal ideas will improve Outcome:  Progressing Goal: Will verbalize positive feelings about self Outcome: Progressing   Problem: Education: Goal: Utilization of techniques to improve thought processes will improve Outcome: Progressing   Problem: Coping: Goal: Coping ability will improve Outcome: Progressing

## 2018-10-22 NOTE — [facility] Group Notes (Signed)
LCSW Group Therapy Note 10/22/2018 1:15pm  Type of Therapy and Topic: Group Therapy: Feelings Around Returning Home & Establishing a Supportive Framework and Supporting Oneself When Supports Not Available  Participation Level: Active  Description of Group:  Patients first processed thoughts and feelings about upcoming discharge. These included fears of upcoming changes, lack of change, new living environments, judgements and expectations from others and overall stigma of mental health issues. The group then discussed the definition of a supportive framework, what that looks and feels like, and how do to discern it from an unhealthy non-supportive network. The group identified different types of supports as well as what to do when your family/friends are less than helpful or unavailable  Therapeutic Goals  1. Patient will identify one healthy supportive network that they can use at discharge. 2. Patient will identify one factor of a supportive framework and how to tell it from an unhealthy network. 3. Patient able to identify one coping skill to use when they do not have positive supports from others. 4. Patient will demonstrate ability to communicate their needs through discussion and/or role plays.  Summary of Patient Progress:  The patient reported "he is not having a good day." Pt engaged during group session. As patients processed their anxiety about discharge and described healthy supports patient shared his thoughts and feelings about being discharge.  Patients identified at least one self-care tool they were willing to use after discharge.   Therapeutic Modalities Cognitive Behavioral Therapy Motivational Interviewing   Tam Savoia  CUEBAS-COLON, LCSW 10/22/2018 9:14 AM

## 2018-10-22 NOTE — Progress Notes (Signed)
[facility] MD Progress Note  10/22/2018 10:46 AM Cory Foster.  MRN:  [id number] Subjective: Follow-up patient with depression and alcohol abuse.  Patient remains fatigued and run down much of the time.  Starting to get a little more appropriate in his behavior though.  Certainly not showing signs of alcohol withdrawal.  Tolerating medicine without difficulty.  Blood pressures remain low despite adding ProAmatine i so I am going to increase the dose of that to 10 mg 3 times a day.  Continue encouraging p.o. intake.  Cutting down on the trazodone as he was too sleepy late in the morning as well. Principal Problem: Alcohol dependence (HCC) Diagnosis: Principal Problem:   Alcohol dependence (HCC) Active Problems:   MDD (major depressive disorder)   GAD (generalized anxiety disorder)   Depression  Total Time spent with patient: 30 minutes  Past Psychiatric History: Past history of depression and alcohol abuse poor compliance  Past Medical History:  Past Medical History:  Diagnosis Date  . Anxiety   . DDD (degenerative disc disease)   . Depression   . GERD (gastroesophageal reflux disease)   . Hyperlipidemia   . Hypertension   . Psoriasis   . Substance abuse (HCC)   . Substance induced mood disorder (HCC)   . Varicose vein     Past Surgical History:  Procedure Laterality Date  . CHOLECYSTECTOMY    . SINUSOTOMY    . VEIN LIGATION AND STRIPPING     Family History:  Family History  Problem Relation Age of Onset  . Depression Mother   . Anxiety disorder Mother   . Cancer Father    Family Psychiatric  History: See previous Social History:  Social History   Substance and Sexual Activity  Alcohol Use Yes  . Alcohol/week: 1.0 standard drinks  . Types: 1 Cans of beer per week   Comment: bi-weekly     Social History   Substance and Sexual Activity  Drug Use No    Social History   Socioeconomic History  . Marital status: Divorced    Spouse name: Not on file  .  Number of children: Not on file  . Years of education: Not on file  . Highest education level: Not on file  Occupational History  . Not on file  Social Needs  . Financial resource strain: Not on file  . Food insecurity:    Worry: Not on file    Inability: Not on file  . Transportation needs:    Medical: Not on file    Non-medical: Not on file  Tobacco Use  . Smoking status: Current Every Day Smoker    Packs/day: 1.00    Types: Cigarettes  . Smokeless tobacco: Never Used  Substance and Sexual Activity  . Alcohol use: Yes    Alcohol/week: 1.0 standard drinks    Types: 1 Cans of beer per week    Comment: bi-weekly  . Drug use: No  . Sexual activity: Not on file  Lifestyle  . Physical activity:    Days per week: Not on file    Minutes per session: Not on file  . Stress: Not on file  Relationships  . Social connections:    Talks on phone: Not on file    Gets together: Not on file    Attends religious service: Not on file    Active member of club or organization: Not on file    Attends meetings of clubs or organizations: Not on file  Relationship status: Not on file  Other Topics Concern  . Not on file  Social History Narrative  . Not on file   Additional Social History:                         Sleep: Fair  Appetite:  Fair  Current Medications: Current Facility-Administered Medications  Medication Dose Route Frequency Provider Last Rate Last Dose  . acetaminophen (TYLENOL) tablet 650 mg  650 mg Oral Q6H PRN He, Jun, MD   650 mg at 10/21/18 1956  . alum & mag hydroxide-simeth (MAALOX/MYLANTA) 200-200-20 MG/5ML suspension 30 mL  30 mL Oral Q4H PRN He, Jun, MD      . atorvastatin (LIPITOR) tablet 80 mg  80 mg Oral Daily He, Jun, MD   80 mg at 10/21/18 1702  . folic acid (FOLVITE) tablet 1 mg  1 mg Oral Daily He, Jun, MD   1 mg at 10/22/18 0842  . insulin aspart (novoLOG) injection 0-5 Units  0-5 Units Subcutaneous QHS He, Jun, MD      . insulin aspart  (novoLOG) injection 0-9 Units  0-9 Units Subcutaneous TID WC He, Jun, MD   1 Units at 10/22/18 0808  . magnesium hydroxide (MILK OF MAGNESIA) suspension 30 mL  30 mL Oral Daily PRN He, Jun, MD   30 mL at 10/19/18 1614  . meloxicam (MOBIC) tablet 15 mg  15 mg Oral Daily , Jackquline Denmark, MD   15 mg at 10/22/18 0842  . metFORMIN (GLUCOPHAGE) tablet 1,000 mg  1,000 mg Oral BID WC He, Jun, MD   1,000 mg at 10/22/18 0842  . midodrine (PROAMATINE) tablet 5 mg  5 mg Oral TID WC ,  T, MD      . multivitamin with minerals tablet 1 tablet  1 tablet Oral Daily He, Jun, MD   1 tablet at 10/22/18 0842  . nicotine (NICODERM CQ - dosed in mg/24 hours) patch 21 mg  21 mg Transdermal Daily He, Jun, MD   21 mg at 10/22/18 0844  . sertraline (ZOLOFT) tablet 100 mg  100 mg Oral BID , Jackquline Denmark, MD   100 mg at 10/22/18 0842  . thiamine (VITAMIN B-1) tablet 100 mg  100 mg Oral Daily He, Jun, MD   100 mg at 10/22/18 0842  . traZODone (DESYREL) tablet 50 mg  50 mg Oral QHS ,  T, MD        Lab Results:  Results for orders placed or performed during the hospital encounter of 10/14/18 (from the past 48 hour(s))  Glucose, capillary     Status: Abnormal   Collection Time: 10/20/18 11:23 AM  Result Value Ref Range   Glucose-Capillary 133 (H) 70 - 99 mg/dL  Glucose, capillary     Status: Abnormal   Collection Time: 10/20/18  4:09 PM  Result Value Ref Range   Glucose-Capillary 153 (H) 70 - 99 mg/dL   Comment 1 Notify RN    Comment 2 Document in Chart   Glucose, capillary     Status: None   Collection Time: 10/20/18  8:33 PM  Result Value Ref Range   Glucose-Capillary 93 70 - 99 mg/dL   Comment 1 Notify RN   Glucose, capillary     Status: Abnormal   Collection Time: 10/21/18  7:03 AM  Result Value Ref Range   Glucose-Capillary 143 (H) 70 - 99 mg/dL   Comment 1 Notify RN   Glucose, capillary  Status: Abnormal   Collection Time: 10/21/18 11:43 AM  Result Value Ref Range    Glucose-Capillary 120 (H) 70 - 99 mg/dL  Glucose, capillary     Status: Abnormal   Collection Time: 10/21/18  4:12 PM  Result Value Ref Range   Glucose-Capillary 110 (H) 70 - 99 mg/dL  Glucose, capillary     Status: Abnormal   Collection Time: 10/21/18  8:40 PM  Result Value Ref Range   Glucose-Capillary 115 (H) 70 - 99 mg/dL   Comment 1 Notify RN   Glucose, capillary     Status: Abnormal   Collection Time: 10/22/18  7:10 AM  Result Value Ref Range   Glucose-Capillary 138 (H) 70 - 99 mg/dL   Comment 1 Notify RN     Blood Alcohol level:  Lab Results  Component Value Date   ETH <10 10/13/2018   ETH 298 (H) 08/01/2018    Metabolic Disorder Labs: Lab Results  Component Value Date   HGBA1C 5.6 10/15/2018   MPG 114.02 10/15/2018   MPG 120 (H) 08/08/2013   No results found for: PROLACTIN Lab Results  Component Value Date   CHOL 113 01/14/2018   TRIG 141 01/14/2018   HDL 16 (L) 01/14/2018   CHOLHDL 7.1 01/14/2018   VLDL 28 01/14/2018   LDLCALC 69 01/14/2018    Physical Findings: AIMS: Facial and Oral Movements Muscles of Facial Expression: None, normal Lips and Perioral Area: None, normal Jaw: None, normal Tongue: None, normal,Extremity Movements Upper (arms, wrists, hands, fingers): None, normal Lower (legs, knees, ankles, toes): None, normal, Trunk Movements Neck, shoulders, hips: None, normal, Overall Severity Severity of abnormal movements (highest score from questions above): None, normal Incapacitation due to abnormal movements: None, normal Patient's awareness of abnormal movements (rate only patient's report): No Awareness, Dental Status Current problems with teeth and/or dentures?: No Does patient usually wear dentures?: No  CIWA:  CIWA-Ar Total: 0 COWS:     Musculoskeletal: Strength & Muscle Tone: within normal limits Gait & Station: normal Patient leans: N/A  Psychiatric Specialty Exam: Physical Exam  Nursing note and vitals  reviewed. Constitutional: He appears well-developed and well-nourished.  HENT:  Head: Normocephalic and atraumatic.  Eyes: Pupils are equal, round, and reactive to light. Conjunctivae are normal.  Neck: Normal range of motion.  Cardiovascular: Regular rhythm and normal heart sounds.  Respiratory: Effort normal. No respiratory distress.  GI: Soft.  Musculoskeletal: Normal range of motion.  Neurological: He is alert.  Skin: Skin is warm and dry.  Psychiatric: Judgment normal. His affect is blunt. His speech is delayed. He is slowed. Thought content is not paranoid. Cognition and memory are normal. He expresses no homicidal and no suicidal ideation.    Review of Systems  Constitutional: Negative.   HENT: Negative.   Eyes: Negative.   Respiratory: Negative.   Cardiovascular: Negative.   Gastrointestinal: Negative.   Musculoskeletal: Negative.   Skin: Negative.   Neurological: Negative.   Psychiatric/Behavioral: Negative.     Blood pressure (!) 85/65, pulse 62, temperature 97.8 F (36.6 C), temperature source Oral, resp. rate 16, height 6\' 2"  (1.88 m), weight 102.5 kg, SpO2 99 %.Body mass index is 29.02 kg/m.  General Appearance: Casual  Eye Contact:  Minimal  Speech:  Slow  Volume:  Decreased  Mood:  Dysphoric  Affect:  Constricted  Thought Process:  Coherent  Orientation:  Full (Time, Place, and Person)  Thought Content:  Logical  Suicidal Thoughts:  No  Homicidal Thoughts:  No  Memory:  Immediate;   Fair Recent;   Fair Remote;   Fair  Judgement:  Fair  Insight:  Fair  Psychomotor Activity:  Decreased  Concentration:  Concentration: Fair  Recall:  [employer]Fair  Fund of Knowledge:  Fair  Language:  Fair  Akathisia:  No  Handed:  Right  AIMS (if indicated):     Assets:  Desire for Improvement Resilience  ADL's:  Impaired  Cognition:  Impaired,  Mild  Sleep:  Number of Hours: 8.3     Treatment Plan Summary: Daily contact with patient to assess and evaluate symptoms and  progress in treatment, Medication management and Plan Increasing medicine for blood pressure improvement.  Cutting back on sleeping medicine.  Just recently increased antidepressant.  Patient is still awaiting acceptance at [facility] treatment center as disposition.  Mordecai RasmussenJohn , MD 10/22/2018, 10:46 AM

## 2018-10-22 NOTE — Progress Notes (Signed)
D- Patient alert and oriented. Patient presents in a sullen/depressed mood on assessment stating that he didn't sleep well last night "tossed and turned" and he stated that the sleeping medication did not work. Patient continues to rate his pain level a "7/10" stating that his pain is in his hips and knees. Patient did not request any pain medication from this writer. Patient rated his depression and anxiety an "8/10" stating that "just life" and "anxiety attacks" is why he's feeling this way. Patient denies SI, HI, AVH, at this time. Patient's goal for today is "going to meeting".   A- Scheduled medications administered to patient, per MD orders. Support and encouragement provided.  Routine safety checks conducted every 15 minutes.  Patient informed to notify staff with problems or concerns.  R- No adverse drug reactions noted. Patient contracts for safety at this time. Patient compliant with medications and treatment plan. Patient receptive, calm, and cooperative. Patient interacts well with others on the unit.  Patient remains safe at this time.

## 2018-10-23 LAB — GLUCOSE, CAPILLARY
GLUCOSE-CAPILLARY: 123 mg/dL — AB (ref 70–99)
Glucose-Capillary: 133 mg/dL — ABNORMAL HIGH (ref 70–99)
Glucose-Capillary: 119 mg/dL — ABNORMAL HIGH (ref 70–99)
Glucose-Capillary: 105 mg/dL — ABNORMAL HIGH (ref 70–99)

## 2018-10-23 NOTE — Plan of Care (Signed)
Patient rated his depression and anxiety 8/10.Patients affect is sad and depressed.States "don't feel like doing anything."Patient stated that his goal for today is stay out of bed and attend groups.Patient is in & out of room.Minimal interactions with peers.Attended groups.Encouraged fluids.Appetite fair.Compliant with medications.Support and encouragement given.

## 2018-10-23 NOTE — Plan of Care (Signed)
D: Patient denies SI. Continues to report pain in his knees 7/10. Medicated with tylenol per prn order. Affect is flat. Mood sad. Present on the unit. Attended group. A: Continue to monitor and offer support. R: Safety maintained 
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
## 2018-10-23 NOTE — Progress Notes (Signed)
Recreation Therapy Notes  Date: 10/23/2018  Time: 9:30 am  Location: Craft Room  Behavioral response: Appropriate, Redirected  Intervention Topic: Problem Solving  Discussion/Intervention:  Group content on today was focused on problem solving. The group described what problem solving is. Patients expressed how problems affect them and how they deal with problems. Individuals identified healthy ways to deal with problems. Patients explained what normally happens to them when they do not deal with problems. The group expressed reoccurring problems for them. The group participated in the intervention "Ways to Solve problems" where patients were given a chance to explore different ways to solve problems.  Clinical Observations/Feedback:  Patient came to group and expressed that he talks to someone when he has a problem. Individual was social with peers and staff while participating in the intervention. Caydn Justen LRT/CTRS         Danial Sisley 10/23/2018 11:21 AM

## 2018-10-23 NOTE — Progress Notes (Signed)
D: Patient denies SI. Continues to report pain in his knees 7/10. Medicated with tylenol per prn order. Affect is flat. Mood sad. Present on the unit. Attended group. A: Continue to monitor and offer support. R: Safety maintained

## 2018-10-23 NOTE — Progress Notes (Signed)
Cory Foster Progress Note  10/23/2018 3:53 PM Cory Greathouseandall Georgeanna HarrisonMcDougal Thibeaux Jr.  MRN:  [id number] Subjective: "I am feeling much better".  Patient says he is feeling better today.  Looking back on it he describes what sounds like a viral infection.  Said he was feeling feverish and dizzy for days.  Finally feeling more stable now.  He is neatly dressed and groomed.  Affect brighter.  Does not report any suicidal ideation.  [facility] but is possibly looking at admission to our [facility].  He has no other place to stay for now. Principal Problem: Alcohol dependence (HCC) Diagnosis: Principal Problem:   Alcohol dependence (HCC) Active Problems:   MDD (major depressive disorder)   GAD (generalized anxiety disorder)   Depression  Total Time spent with patient: 20 minutes  Past Psychiatric History: Past history of alcohol abuse and depression  Past Medical History:  Past Medical History:  Diagnosis Date  . Anxiety   . DDD (degenerative disc disease)   . Depression   . GERD (gastroesophageal reflux disease)   . Hyperlipidemia   . Hypertension   . Psoriasis   . Substance abuse (HCC)   . Substance induced mood disorder (HCC)   . Varicose vein     Past Surgical History:  Procedure Laterality Date  . CHOLECYSTECTOMY    . SINUSOTOMY    . VEIN LIGATION AND STRIPPING     Family History:  Family History  Problem Relation Age of Onset  . Depression Mother   . Anxiety disorder Mother   . Cancer Father    Family Psychiatric  History: See previous Social History:  Social History   Substance and Sexual Activity  Alcohol Use Yes  . Alcohol/week: 1.0 standard drinks  . Types: 1 Cans of beer per week   Comment: bi-weekly     Social History   Substance and Sexual Activity  Drug Use No    Social History   Socioeconomic History  . Marital status: Divorced    Spouse name: Not on file  . Number of children: Not on file  . Years of  education: Not on file  . Highest education level: Not on file  Occupational History  . Not on file  Social Needs  . Financial resource strain: Not on file  . Food insecurity:    Worry: Not on file    Inability: Not on file  . Transportation needs:    Medical: Not on file    Non-medical: Not on file  Tobacco Use  . Smoking status: Current Every Day Smoker    Packs/day: 1.00    Types: Cigarettes  . Smokeless tobacco: Never Used  Substance and Sexual Activity  . Alcohol use: Yes    Alcohol/week: 1.0 standard drinks    Types: 1 Cans of beer per week    Comment: bi-weekly  . Drug use: No  . Sexual activity: Not on file  Lifestyle  . Physical activity:    Days per week: Not on file    Minutes per session: Not on file  . Stress: Not on file  Relationships  . Social connections:    Talks on phone: Not on file    Gets together: Not on file    Attends religious service: Not on file    Active member of club or organization: Not on file    Attends meetings of clubs or organizations: Not on file    Relationship status:  Not on file  Other Topics Concern  . Not on file  Social History Narrative  . Not on file   Additional Social History:                         Sleep: Fair  Appetite:  Fair  Current Medications: Current Facility-Administered Medications  Medication Dose Route Frequency Provider Last Rate Last Dose  . acetaminophen (TYLENOL) tablet 650 mg  650 mg Oral Q6H PRN He, Jun, Foster   650 mg at 10/22/18 2139  . alum & mag hydroxide-simeth (MAALOX/MYLANTA) 200-200-20 MG/5ML suspension 30 mL  30 mL Oral Q4H PRN He, Jun, Foster      . atorvastatin (LIPITOR) tablet 80 mg  80 mg Oral Daily He, Jun, Foster   80 mg at 10/22/18 1712  . folic acid (FOLVITE) tablet 1 mg  1 mg Oral Daily He, Jun, Foster   1 mg at 10/23/18 0811  . insulin aspart (novoLOG) injection 0-5 Units  0-5 Units Subcutaneous QHS He, Jun, Foster      . insulin aspart (novoLOG) injection 0-9 Units  0-9 Units  Subcutaneous TID WC He, Jun, Foster   1 Units at 10/23/18 1207  . magnesium hydroxide (MILK OF MAGNESIA) suspension 30 mL  30 mL Oral Daily PRN He, Jun, Foster   30 mL at 10/19/18 1614  . meloxicam (MOBIC) tablet 15 mg  15 mg Oral Daily Clapacs, Jackquline Denmark, Foster   15 mg at 10/23/18 0811  . metFORMIN (GLUCOPHAGE) tablet 1,000 mg  1,000 mg Oral BID WC He, Jun, Foster   1,000 mg at 10/23/18 0816  . midodrine (PROAMATINE) tablet 5 mg  5 mg Oral TID WC Clapacs, Jackquline Denmark, Foster   5 mg at 10/23/18 1225  . multivitamin with minerals tablet 1 tablet  1 tablet Oral Daily He, Jun, Foster   1 tablet at 10/23/18 0813  . nicotine (NICODERM CQ - dosed in mg/24 hours) patch 21 mg  21 mg Transdermal Daily He, Jun, Foster   21 mg at 10/23/18 0813  . sertraline (ZOLOFT) tablet 100 mg  100 mg Oral BID Clapacs, Jackquline Denmark, Foster   100 mg at 10/23/18 0811  . thiamine (VITAMIN B-1) tablet 100 mg  100 mg Oral Daily He, Jun, Foster   100 mg at 10/23/18 0811  . traZODone (DESYREL) tablet 50 mg  50 mg Oral QHS Clapacs, John T, Foster   50 mg at 10/22/18 2140    Lab Results:  Results for orders placed or performed during the hospital encounter of 10/14/18 (from the past 48 hour(s))  Glucose, capillary     Status: Abnormal   Collection Time: 10/21/18  4:12 PM  Result Value Ref Range   Glucose-Capillary 110 (H) 70 - 99 mg/dL  Glucose, capillary     Status: Abnormal   Collection Time: 10/21/18  8:40 PM  Result Value Ref Range   Glucose-Capillary 115 (H) 70 - 99 mg/dL   Comment 1 Notify RN   Glucose, capillary     Status: Abnormal   Collection Time: 10/22/18  7:10 AM  Result Value Ref Range   Glucose-Capillary 138 (H) 70 - 99 mg/dL   Comment 1 Notify RN   Glucose, capillary     Status: Abnormal   Collection Time: 10/22/18 11:26 AM  Result Value Ref Range   Glucose-Capillary 146 (H) 70 - 99 mg/dL  Glucose, capillary     Status: Abnormal   Collection Time: 10/22/18  4:23 PM  Result Value Ref Range   Glucose-Capillary 123 (H) 70 - 99 mg/dL  Glucose, capillary      Status: Abnormal   Collection Time: 10/22/18  8:40 PM  Result Value Ref Range   Glucose-Capillary 133 (H) 70 - 99 mg/dL  Glucose, capillary     Status: Abnormal   Collection Time: 10/23/18  7:03 AM  Result Value Ref Range   Glucose-Capillary 133 (H) 70 - 99 mg/dL   Comment 1 Notify RN   Glucose, capillary     Status: Abnormal   Collection Time: 10/23/18 11:25 AM  Result Value Ref Range   Glucose-Capillary 123 (H) 70 - 99 mg/dL    Blood Alcohol level:  Lab Results  Component Value Date   ETH <10 10/13/2018   ETH 298 (H) 08/01/2018    Metabolic Disorder Labs: Lab Results  Component Value Date   HGBA1C 5.6 10/15/2018   MPG 114.02 10/15/2018   MPG 120 (H) 08/08/2013   No results found for: PROLACTIN Lab Results  Component Value Date   CHOL 113 01/14/2018   TRIG 141 01/14/2018   HDL 16 (L) 01/14/2018   CHOLHDL 7.1 01/14/2018   VLDL 28 01/14/2018   LDLCALC 69 01/14/2018    Physical Findings: AIMS: Facial and Oral Movements Muscles of Facial Expression: None, normal Lips and Perioral Area: None, normal Jaw: None, normal Tongue: None, normal,Extremity Movements Upper (arms, wrists, hands, fingers): None, normal Lower (legs, knees, ankles, toes): None, normal, Trunk Movements Neck, shoulders, hips: None, normal, Overall Severity Severity of abnormal movements (highest score from questions above): None, normal Incapacitation due to abnormal movements: None, normal Patient's awareness of abnormal movements (rate only patient's report): No Awareness, Dental Status Current problems with teeth and/or dentures?: No Does patient usually wear dentures?: No  CIWA:  CIWA-Ar Total: 0 COWS:     Musculoskeletal: Strength & Muscle Tone: within normal limits Gait & Station: normal Patient leans: N/A  Psychiatric Specialty Exam: Physical Exam  Nursing note and vitals reviewed. Constitutional: He appears well-developed and well-nourished.  HENT:  Head: Normocephalic and  atraumatic.  Eyes: Pupils are equal, round, and reactive to light. Conjunctivae are normal.  Neck: Normal range of motion.  Cardiovascular: Regular rhythm and normal heart sounds.  Respiratory: Effort normal. No respiratory distress.  GI: Soft.  Musculoskeletal: Normal range of motion.  Neurological: He is alert.  Skin: Skin is warm and dry.  Psychiatric: He has a normal mood and affect. His speech is normal and behavior is normal. Judgment and thought content normal. Cognition and memory are normal.    Review of Systems  Constitutional: Negative.   HENT: Negative.   Eyes: Negative.   Respiratory: Negative.   Cardiovascular: Negative.   Gastrointestinal: Negative.   Musculoskeletal: Negative.   Skin: Negative.   Neurological: Negative.   Psychiatric/Behavioral: Negative.     Blood pressure 104/63, pulse 66, temperature 97.8 F (36.6 C), temperature source Oral, resp. rate 16, height 6\' 2"  (1.88 m), weight 102.5 kg, SpO2 97 %.Body mass index is 29.02 kg/m.  General Appearance: Casual  Eye Contact:  Good  Speech:  Clear and Coherent  Volume:  Normal  Mood:  Euthymic  Affect:  Constricted  Thought Process:  Coherent  Orientation:  Full (Time, Place, and Person)  Thought Content:  Logical  Suicidal Thoughts:  No  Homicidal Thoughts:  No  Memory:  Immediate;   Fair Recent;   Fair Remote;   Fair  Judgement:  Fair  Insight:  Fair  Psychomotor Activity:  Decreased  Concentration:  Concentration: Fair  Recall:  [employer]  Fund of Knowledge:  Fair  Language:  Fair  Akathisia:  No  Handed:  Right  AIMS (if indicated):     Assets:  Desire for Improvement Resilience  ADL's:  Intact  Cognition:  WNL  Sleep:  Number of Hours: 7.25     Treatment Plan Summary: Daily contact with patient to assess and evaluate symptoms and progress in treatment, Medication management and Plan Finally looking better.  We talked about how it would be good for him to try to stay sober and keep this  improvement once he gets out of the hospital.  Patient has no place to go and might be able to get [facility].  Even though his days of run out we will not discharge him quite yet.  Urged him to go to groups continue current medicine.  Mordecai RasmussenJohn Clapacs, Foster 10/23/2018, 3:53 PM

## 2018-10-23 NOTE — [facility] Group Notes (Signed)
Overcoming Obstacles  10/23/2018 1PM  Type of Therapy and Topic:  Group Therapy:  Overcoming Obstacles  Participation Level:  Minimal    Description of Group:    In this group patients will be encouraged to explore what they see as obstacles to their own wellness and recovery. They will be guided to discuss their thoughts, feelings, and behaviors related to these obstacles. The group will process together ways to cope with barriers, with attention given to specific choices patients can make. Each patient will be challenged to identify changes they are motivated to make in order to overcome their obstacles. This group will be process-oriented, with patients participating in exploration of their own experiences as well as giving and receiving support and challenge from other group members.   Therapeutic Goals: 1. Patient will identify personal and current obstacles as they relate to admission. 2. Patient will identify barriers that currently interfere with their wellness or overcoming obstacles.  3. Patient will identify feelings, thought process and behaviors related to these barriers. 4. Patient will identify two changes they are willing to make to overcome these obstacles:      Summary of Patient Progress Pt had minimal interaction with group members. Pt sat quietly during session, no feedback provided during session.    Therapeutic Modalities:   Cognitive Behavioral Therapy Solution Focused Therapy Motivational Interviewing Relapse Prevention Therapy    Lowella Dandyarren Rayon Mcchristian, MSW, LCSW 10/23/2018 2:17 PM

## 2018-10-24 LAB — GLUCOSE, CAPILLARY
Glucose-Capillary: 144 mg/dL — ABNORMAL HIGH (ref 70–99)
Glucose-Capillary: 89 mg/dL (ref 70–99)
Glucose-Capillary: 116 mg/dL — ABNORMAL HIGH (ref 70–99)
Glucose-Capillary: 118 mg/dL — ABNORMAL HIGH (ref 70–99)

## 2018-10-24 NOTE — Progress Notes (Signed)
Recreation Therapy Notes   Date: 10/24/2018  Time: 9:30 am   Location: Craft room   Behavioral response: N/A   Intervention Topic: Communication  Discussion/Intervention: Patient did not attend group.   Clinical Observations/Feedback:  Patient did not attend group.   Sharicka Pogorzelski LRT/CTRS        Valli Randol 10/24/2018 11:08 AM 

## 2018-10-24 NOTE — Plan of Care (Signed)
Patient alert and oriented x 4. Present in the milieu with a steady gait. Patient compliant with meals and medication. Affect pleasant and cooperative. Denies SI/HI/AVH and pain. Patient is compliant with treatment plan. Attends group and actively participates. Milieu remains safe with q 15 minute safety checks. Will continue to monitor.

## 2018-10-24 NOTE — Progress Notes (Signed)
[facility] MD Progress Note  10/24/2018 4:53 PM Cory Foster.  MRN:  [id number] Subjective: Patient says he is feeling better.  As usual he has multiple complaints about aches and pains but not reporting any suicidal ideation.  Seems to be a little cognitively impaired.  Short-term memory is clearly poor.  I think his ability to make home for himself is pretty poor at this point and so I am hopeful that we can get him into a substance abuse facility Principal Problem: Alcohol dependence (HCC) Diagnosis: Principal Problem:   Alcohol dependence (HCC) Active Problems:   MDD (major depressive disorder)   GAD (generalized anxiety disorder)   Depression  Total Time spent with patient: 20 minutes  Past Psychiatric History: Chronic depression and alcohol abuse  Past Medical History:  Past Medical History:  Diagnosis Date  . Anxiety   . DDD (degenerative disc disease)   . Depression   . GERD (gastroesophageal reflux disease)   . Hyperlipidemia   . Hypertension   . Psoriasis   . Substance abuse (HCC)   . Substance induced mood disorder (HCC)   . Varicose vein     Past Surgical History:  Procedure Laterality Date  . CHOLECYSTECTOMY    . SINUSOTOMY    . VEIN LIGATION AND STRIPPING     Family History:  Family History  Problem Relation Age of Onset  . Depression Mother   . Anxiety disorder Mother   . Cancer Father    Family Psychiatric  History: None Social History:  Social History   Substance and Sexual Activity  Alcohol Use Yes  . Alcohol/week: 1.0 standard drinks  . Types: 1 Cans of beer per week   Comment: bi-weekly     Social History   Substance and Sexual Activity  Drug Use No    Social History   Socioeconomic History  . Marital status: Divorced    Spouse name: Not on file  . Number of children: Not on file  . Years of education: Not on file  . Highest education level: Not on file  Occupational History  . Not on file  Social Needs  . Financial  resource strain: Not on file  . Food insecurity:    Worry: Not on file    Inability: Not on file  . Transportation needs:    Medical: Not on file    Non-medical: Not on file  Tobacco Use  . Smoking status: Current Every Day Smoker    Packs/day: 1.00    Types: Cigarettes  . Smokeless tobacco: Never Used  Substance and Sexual Activity  . Alcohol use: Yes    Alcohol/week: 1.0 standard drinks    Types: 1 Cans of beer per week    Comment: bi-weekly  . Drug use: No  . Sexual activity: Not on file  Lifestyle  . Physical activity:    Days per week: Not on file    Minutes per session: Not on file  . Stress: Not on file  Relationships  . Social connections:    Talks on phone: Not on file    Gets together: Not on file    Attends religious service: Not on file    Active member of club or organization: Not on file    Attends meetings of clubs or organizations: Not on file    Relationship status: Not on file  Other Topics Concern  . Not on file  Social History Narrative  . Not on file   Additional Social History:  Sleep: Fair  Appetite:  Fair  Current Medications: Current Facility-Administered Medications  Medication Dose Route Frequency Provider Last Rate Last Dose  . acetaminophen (TYLENOL) tablet 650 mg  650 mg Oral Q6H PRN He, Jun, MD   650 mg at 10/24/18 1638  . alum & mag hydroxide-simeth (MAALOX/MYLANTA) 200-200-20 MG/5ML suspension 30 mL  30 mL Oral Q4H PRN He, Jun, MD      . atorvastatin (LIPITOR) tablet 80 mg  80 mg Oral Daily He, Jun, MD   80 mg at 10/24/18 1635  . folic acid (FOLVITE) tablet 1 mg  1 mg Oral Daily He, Jun, MD   1 mg at 10/24/18 0758  . insulin aspart (novoLOG) injection 0-5 Units  0-5 Units Subcutaneous QHS He, Jun, MD      . insulin aspart (novoLOG) injection 0-9 Units  0-9 Units Subcutaneous TID WC He, Jun, MD   1 Units at 10/24/18 0755  . magnesium hydroxide (MILK OF MAGNESIA) suspension 30 mL  30 mL Oral Daily PRN  He, Jun, MD   30 mL at 10/19/18 1614  . meloxicam (MOBIC) tablet 15 mg  15 mg Oral Daily Shepherd Finnan, Jackquline Denmark, MD   15 mg at 10/24/18 0758  . metFORMIN (GLUCOPHAGE) tablet 1,000 mg  1,000 mg Oral BID WC He, Jun, MD   1,000 mg at 10/24/18 1636  . midodrine (PROAMATINE) tablet 5 mg  5 mg Oral TID WC Claryce Friel, Jackquline Denmark, MD   5 mg at 10/24/18 1635  . multivitamin with minerals tablet 1 tablet  1 tablet Oral Daily He, Jun, MD   1 tablet at 10/24/18 0758  . nicotine (NICODERM CQ - dosed in mg/24 hours) patch 21 mg  21 mg Transdermal Daily He, Jun, MD   21 mg at 10/24/18 0745  . sertraline (ZOLOFT) tablet 100 mg  100 mg Oral BID Ariahna Smiddy, Jackquline Denmark, MD   100 mg at 10/24/18 1636  . thiamine (VITAMIN B-1) tablet 100 mg  100 mg Oral Daily He, Jun, MD   100 mg at 10/24/18 0759  . traZODone (DESYREL) tablet 50 mg  50 mg Oral QHS Braedyn Riggle, Jackquline Denmark, MD   50 mg at 10/23/18 2116    Lab Results:  Results for orders placed or performed during the hospital encounter of 10/14/18 (from the past 48 hour(s))  Glucose, capillary     Status: Abnormal   Collection Time: 10/22/18  8:40 PM  Result Value Ref Range   Glucose-Capillary 133 (H) 70 - 99 mg/dL  Glucose, capillary     Status: Abnormal   Collection Time: 10/23/18  7:03 AM  Result Value Ref Range   Glucose-Capillary 133 (H) 70 - 99 mg/dL   Comment 1 Notify RN   Glucose, capillary     Status: Abnormal   Collection Time: 10/23/18 11:25 AM  Result Value Ref Range   Glucose-Capillary 123 (H) 70 - 99 mg/dL  Glucose, capillary     Status: Abnormal   Collection Time: 10/23/18  4:19 PM  Result Value Ref Range   Glucose-Capillary 119 (H) 70 - 99 mg/dL  Glucose, capillary     Status: Abnormal   Collection Time: 10/23/18  9:13 PM  Result Value Ref Range   Glucose-Capillary 105 (H) 70 - 99 mg/dL   Comment 1 Notify RN   Glucose, capillary     Status: Abnormal   Collection Time: 10/24/18  7:02 AM  Result Value Ref Range   Glucose-Capillary 144 (H) 70 - 99 mg/dL  Comment 1  Notify RN   Glucose, capillary     Status: None   Collection Time: 10/24/18 11:26 AM  Result Value Ref Range   Glucose-Capillary 89 70 - 99 mg/dL   Comment 1 Notify RN   Glucose, capillary     Status: Abnormal   Collection Time: 10/24/18  4:29 PM  Result Value Ref Range   Glucose-Capillary 116 (H) 70 - 99 mg/dL    Blood Alcohol level:  Lab Results  Component Value Date   ETH <10 10/13/2018   ETH 298 (H) 08/01/2018    Metabolic Disorder Labs: Lab Results  Component Value Date   HGBA1C 5.6 10/15/2018   MPG 114.02 10/15/2018   MPG 120 (H) 08/08/2013   No results found for: PROLACTIN Lab Results  Component Value Date   CHOL 113 01/14/2018   TRIG 141 01/14/2018   HDL 16 (L) 01/14/2018   CHOLHDL 7.1 01/14/2018   VLDL 28 01/14/2018   LDLCALC 69 01/14/2018    Physical Findings: AIMS: Facial and Oral Movements Muscles of Facial Expression: None, normal Lips and Perioral Area: None, normal Jaw: None, normal Tongue: None, normal,Extremity Movements Upper (arms, wrists, hands, fingers): None, normal Lower (legs, knees, ankles, toes): None, normal, Trunk Movements Neck, shoulders, hips: None, normal, Overall Severity Severity of abnormal movements (highest score from questions above): None, normal Incapacitation due to abnormal movements: None, normal Patient's awareness of abnormal movements (rate only patient's report): No Awareness, Dental Status Current problems with teeth and/or dentures?: No Does patient usually wear dentures?: No  CIWA:  CIWA-Ar Total: 0 COWS:     Musculoskeletal: Strength & Muscle Tone: within normal limits Gait & Station: normal Patient leans: N/A  Psychiatric Specialty Exam: Physical Exam  Nursing note and vitals reviewed. Constitutional: He appears well-developed and well-nourished.  HENT:  Head: Normocephalic and atraumatic.  Eyes: Pupils are equal, round, and reactive to light. Conjunctivae are normal.  Neck: Normal range of motion.   Cardiovascular: Regular rhythm and normal heart sounds.  Respiratory: Effort normal. No respiratory distress.  GI: Soft.  Musculoskeletal: Normal range of motion.  Neurological: He is alert.  Skin: Skin is warm and dry.  Psychiatric: He has a normal mood and affect. His behavior is normal. Judgment and thought content normal.    Review of Systems  Constitutional: Negative.   HENT: Negative.   Eyes: Negative.   Respiratory: Negative.   Cardiovascular: Negative.   Gastrointestinal: Negative.   Musculoskeletal: Negative.   Skin: Negative.   Neurological: Negative.   Psychiatric/Behavioral: Negative.     Blood pressure 104/75, pulse 60, temperature 97.6 F (36.4 C), temperature source Oral, resp. rate 18, height 6\' 2"  (1.88 m), weight 102.5 kg, SpO2 99 %.Body mass index is 29.02 kg/m.  General Appearance: Casual  Eye Contact:  Fair  Speech:  Slow  Volume:  Decreased  Mood:  Euthymic  Affect:  Congruent  Thought Process:  Goal Directed  Orientation:  Full (Time, Place, and Person)  Thought Content:  Logical  Suicidal Thoughts:  No  Homicidal Thoughts:  No  Memory:  Immediate;   Fair Recent;   Fair Remote;   Fair  Judgement:  Fair  Insight:  Fair  Psychomotor Activity:  Decreased  Concentration:  Concentration: Fair  Recall:  [employer]Fair  Fund of Knowledge:  Fair  Language:  Fair  Akathisia:  No  Handed:  Right  AIMS (if indicated):     Assets:  Desire for Improvement  ADL's:  Impaired  Cognition:  Impaired,  Mild  Sleep:  Number of Hours: 5.45     Treatment Plan Summary: Daily contact with patient to assess and evaluate symptoms and progress in treatment, Medication management and Plan [facility] and I am very hopeful that we can get him out of here soon after that.  Continue medical and physical support as well as encouragement to attend groups especially substance abuse groups  Mordecai RasmussenJohn Mandy Peeks, MD 10/24/2018, 4:53 PM

## 2018-10-24 NOTE — [facility] Group Notes (Signed)
Feelings Around Diagnosis 10/24/2018 1PM  Type of Therapy/Topic:  Group Therapy:  Feelings about Diagnosis  Participation Level:  Minimal   Description of Group:   This group will allow patients to explore their thoughts and feelings about diagnoses they have received. Patients will be guided to explore their level of understanding and acceptance of these diagnoses. Facilitator will encourage patients to process their thoughts and feelings about the reactions of others to their diagnosis and will guide patients in identifying ways to discuss their diagnosis with significant others in their lives. This group will be process-oriented, with patients participating in exploration of their own experiences, giving and receiving support, and processing challenge from other group members.   Therapeutic Goals: 1. Patient will demonstrate understanding of diagnosis as evidenced by identifying two or more symptoms of the disorder 2. Patient will be able to express two feelings regarding the diagnosis 3. Patient will demonstrate their ability to communicate their needs through discussion and/or role play  Summary of Patient Progress:  Minimal interaction with group members. Pt sat quietly, no input provided but did participate in the group icebreaker.     Therapeutic Modalities:   Cognitive Behavioral Therapy Brief Therapy Feelings Identification    Suzan Slick, LCSW 10/24/2018 2:24 PM

## 2018-10-24 NOTE — Plan of Care (Signed)
Patient alert and oriented x 4. Present in the milieu with a steady gait. Patient compliant with meals and medication. Affect is pleasant and patient is cooperative, Attended group today and wen outside during recreational therapy. Denies SI/HI/AVh. Endorses some anxiety but states, "It's nothing that I can't handle. Milieu remains sfe with q 15 minute safety checks.

## 2018-10-25 LAB — GLUCOSE, CAPILLARY
Glucose-Capillary: 122 mg/dL — ABNORMAL HIGH (ref 70–99)
Glucose-Capillary: 103 mg/dL — ABNORMAL HIGH (ref 70–99)
Glucose-Capillary: 109 mg/dL — ABNORMAL HIGH (ref 70–99)
Glucose-Capillary: 100 mg/dL — ABNORMAL HIGH (ref 70–99)

## 2018-10-25 NOTE — Tx Team (Signed)
Interdisciplinary Treatment and Diagnostic Plan Update  10/25/2018 Time of Session: 8:30AM Cory Foster Cory Lericheameron Jr. MRN: [id number]  Principal Diagnosis: Alcohol dependence (HCC)  Secondary Diagnoses: Principal Problem:   Alcohol dependence (HCC) Active Problems:   MDD (major depressive disorder)   GAD (generalized anxiety disorder)   Depression   Current Medications:  Current Facility-Administered Medications  Medication Dose Route Frequency Provider Last Rate Last Dose  . acetaminophen (TYLENOL) tablet 650 mg  650 mg Oral Q6H PRN He, Jun, MD   650 mg at 10/24/18 1638  . alum & mag hydroxide-simeth (MAALOX/MYLANTA) 200-200-20 MG/5ML suspension 30 mL  30 mL Oral Q4H PRN He, Jun, MD      . atorvastatin (LIPITOR) tablet 80 mg  80 mg Oral Daily He, Jun, MD   80 mg at 10/24/18 1635  . folic acid (FOLVITE) tablet 1 mg  1 mg Oral Daily He, Jun, MD   1 mg at 10/24/18 0758  . insulin aspart (novoLOG) injection 0-5 Units  0-5 Units Subcutaneous QHS He, Jun, MD      . insulin aspart (novoLOG) injection 0-9 Units  0-9 Units Subcutaneous TID WC He, Jun, MD   1 Units at 10/25/18 0756  . magnesium hydroxide (MILK OF MAGNESIA) suspension 30 mL  30 mL Oral Daily PRN He, Jun, MD   30 mL at 10/19/18 1614  . meloxicam (MOBIC) tablet 15 mg  15 mg Oral Daily Clapacs, Jackquline DenmarkJohn T, MD   15 mg at 10/24/18 0758  . metFORMIN (GLUCOPHAGE) tablet 1,000 mg  1,000 mg Oral BID WC He, Jun, MD   1,000 mg at 10/24/18 1636  . midodrine (PROAMATINE) tablet 5 mg  5 mg Oral TID WC Clapacs, Jackquline DenmarkJohn T, MD   5 mg at 10/24/18 1635  . multivitamin with minerals tablet 1 tablet  1 tablet Oral Daily He, Jun, MD   1 tablet at 10/24/18 0758  . nicotine (NICODERM CQ - dosed in mg/24 hours) patch 21 mg  21 mg Transdermal Daily He, Jun, MD   21 mg at 10/24/18 0745  . sertraline (ZOLOFT) tablet 100 mg  100 mg Oral BID Clapacs, Jackquline DenmarkJohn T, MD   100 mg at 10/24/18 1636  . thiamine (VITAMIN B-1) tablet 100 mg  100 mg Oral Daily He, Jun, MD   100  mg at 10/24/18 0759  . traZODone (DESYREL) tablet 50 mg  50 mg Oral QHS Clapacs, Jackquline DenmarkJohn T, MD   50 mg at 10/24/18 2215   PTA Medications: Medications Prior to Admission  Medication Sig Dispense Refill Last Dose  . atorvastatin (LIPITOR) 80 MG tablet Take 80 mg by mouth daily.    Past Month at Unknown time  . folic acid (FOLVITE) 1 MG tablet Take 1 tablet (1 mg total) by mouth daily. (Patient not taking: Reported on 03/07/2018) 30 tablet 1 Not Taking at Unknown time  . insulin glargine (LANTUS) 100 UNIT/ML injection Inject 0.1 mLs (10 Units total) into the skin at bedtime. (Patient taking differently: Inject 16 Units into the skin at bedtime as needed (BS). ) 10 mL 11 Past Month at Unknown time  . metFORMIN (GLUCOPHAGE) 500 MG tablet Take 1,000 mg by mouth 2 (two) times daily with a meal.    Past Month at Unknown time  . sertraline (ZOLOFT) 100 MG tablet Take 1 tablet (100 mg total) by mouth 2 (two) times daily. For depression/anxiety 60 tablet 0 Past Month at Unknown time  . thiamine 100 MG tablet Take 1 tablet (100 mg total)  by mouth daily. (Patient not taking: Reported on 03/07/2018) 30 tablet 0 Not Taking at Unknown time    Patient Stressors: Financial difficulties Legal issue Marital or family conflict Medication change or noncompliance  Patient Strengths: Capable of independent living Metallurgist fund of knowledge  Treatment Modalities: Medication Management, Group therapy, Case management,  1 to 1 session with clinician, Psychoeducation, Recreational therapy.   Physician Treatment Plan for Primary Diagnosis: Alcohol dependence (HCC) Long Term Goal(s): Improvement in symptoms so as ready for discharge Improvement in symptoms so as ready for discharge   Short Term Goals: Ability to identify changes in lifestyle to reduce recurrence of condition will improve Ability to verbalize feelings will improve Ability to disclose and discuss suicidal  ideas Ability to demonstrate self-control will improve Ability to identify and develop effective coping behaviors will improve Ability to maintain clinical measurements within normal limits will improve Compliance with prescribed medications will improve Ability to identify triggers associated with substance abuse/mental health issues will improve Ability to identify changes in lifestyle to reduce recurrence of condition will improve Ability to verbalize feelings will improve Ability to disclose and discuss suicidal ideas Ability to demonstrate self-control will improve Ability to identify and develop effective coping behaviors will improve Ability to maintain clinical measurements within normal limits will improve Compliance with prescribed medications will improve Ability to identify triggers associated with substance abuse/mental health issues will improve  Medication Management: Evaluate patient's response, side effects, and tolerance of medication regimen.  Therapeutic Interventions: 1 to 1 sessions, Unit Group sessions and Medication administration.  Evaluation of Outcomes: Progressing  Physician Treatment Plan for Secondary Diagnosis: Principal Problem:   Alcohol dependence (HCC) Active Problems:   MDD (major depressive disorder)   GAD (generalized anxiety disorder)   Depression  Long Term Goal(s): Improvement in symptoms so as ready for discharge Improvement in symptoms so as ready for discharge   Short Term Goals: Ability to identify changes in lifestyle to reduce recurrence of condition will improve Ability to verbalize feelings will improve Ability to disclose and discuss suicidal ideas Ability to demonstrate self-control will improve Ability to identify and develop effective coping behaviors will improve Ability to maintain clinical measurements within normal limits will improve Compliance with prescribed medications will improve Ability to identify triggers  associated with substance abuse/mental health issues will improve Ability to identify changes in lifestyle to reduce recurrence of condition will improve Ability to verbalize feelings will improve Ability to disclose and discuss suicidal ideas Ability to demonstrate self-control will improve Ability to identify and develop effective coping behaviors will improve Ability to maintain clinical measurements within normal limits will improve Compliance with prescribed medications will improve Ability to identify triggers associated with substance abuse/mental health issues will improve     Medication Management: Evaluate patient's response, side effects, and tolerance of medication regimen.  Therapeutic Interventions: 1 to 1 sessions, Unit Group sessions and Medication administration.  Evaluation of Outcomes: Progressing   RN Treatment Plan for Primary Diagnosis: Alcohol dependence (HCC) Long Term Goal(s): Knowledge of disease and therapeutic regimen to maintain health will improve  Short Term Goals: Ability to demonstrate self-control, Ability to participate in decision making will improve, Ability to verbalize feelings will improve, Ability to identify and develop effective coping behaviors will improve and Compliance with prescribed medications will improve  Medication Management: RN will administer medications as ordered by provider, will assess and evaluate patient's response and provide education to patient for prescribed medication. RN will report any adverse  and/or side effects to prescribing provider.  Therapeutic Interventions: 1 on 1 counseling sessions, Psychoeducation, Medication administration, Evaluate responses to treatment, Monitor vital signs and CBGs as ordered, Perform/monitor CIWA, COWS, AIMS and Fall Risk screenings as ordered, Perform wound care treatments as ordered.  Evaluation of Outcomes: Progressing   LCSW Treatment Plan for Primary Diagnosis: Alcohol dependence  (HCC) Long Term Goal(s): Safe transition to appropriate next level of care at discharge, Engage patient in therapeutic group addressing interpersonal concerns.  Short Term Goals: Engage patient in aftercare planning with referrals and resources, Increase social support, Increase ability to appropriately verbalize feelings, Identify triggers associated with mental health/substance abuse issues and Increase skills for wellness and recovery  Therapeutic Interventions: Assess for all discharge needs, 1 to 1 time with Social worker, Explore available resources and support systems, Assess for adequacy in community support network, Educate family and significant other(s) on suicide prevention, Complete Psychosocial Assessment, Interpersonal group therapy.  Evaluation of Outcomes: Progressing   Progress in Treatment: Attending groups: Yes. Participating in groups: Yes. Taking medication as prescribed: Yes. Toleration medication: Yes. Family/Significant other contact made: Yes, individual(s) contacted:  SPE completed with the patient's friend.  Patient understands diagnosis: Yes. Discussing patient identified problems/goals with staff: Yes. Medical problems stabilized or resolved: Yes. Denies suicidal/homicidal ideation: Yes. Issues/concerns per patient self-inventory: No. Other: none  New problem(s) identified: No, Describe:  none  New Short Term/Long Term Goal(s): detox, elimination of symptoms of psychosis, medication management for mood stabilization; elimination of SI thoughts; development of comprehensive mental wellness/sobriety plan.  Patient Goals:  "detox and stabilize medically.  I have high blood pressure and diabetes."  Discharge Plan or Barriers: Patient reports that he would like a residential program at this time.  Patient is aligned with outpatient if not approved for residential due to physical health limitations.  Reason for Continuation of Hospitalization:  Anxiety Depression Medication stabilization Withdrawal symptoms  Estimated Length of Stay: 1-5 days  Recreational Therapy: Patient Stressors: N/A  Patient Goal: Patient will identify 3 triggers for substance use within 5 recreation therapy group sessions  Attendees: Patient: Cory Foster 10/25/2018 9:27 AM  Physician: Dr. Toni Amend, MD 10/25/2018 9:27 AM  Nursing:  10/25/2018 9:27 AM  RN Care Manager: 10/25/2018 9:27 AM  Social Worker: Penni Homans, LCSW 10/25/2018 9:27 AM  Recreational Therapist:  10/25/2018 9:27 AM  Other:  10/25/2018 9:27 AM  Other:  10/25/2018 9:27 AM  Other: 10/25/2018 9:27 AM    Scribe for Treatment Team: Harden Mo, LCSW 10/25/2018 9:27 AM

## 2018-10-25 NOTE — Progress Notes (Signed)
Patient is alert and oriented x 4, affect is flat, but  he brightens upon approach. Patient's thoughts are organized and coherent, he forwards information when asked by Clinical research associate. Patient was visible in the unit, interacting appropriately with peers and staff, and he rated his  depression a 5/10 on a scale (0 low and 10 high). Patient was offered support and encouraged to attend evening wrap up group. Patient complaint with evening medication and he attended evening wrap up group. 15 minutes safety checks maintained will continue to monitor

## 2018-10-25 NOTE — [facility] Group Notes (Signed)
LCSW Group Therapy Note  10/25/2018 1:00PM  Type of Therapy/Topic:  Group Therapy:  Emotion Regulation  Participation Level:  Active   Description of Group:   The purpose of this group is to assist patients in learning to regulate negative emotions and experience positive emotions. Patients will be guided to discuss ways in which they have been vulnerable to their negative emotions. These vulnerabilities will be juxtaposed with experiences of positive emotions or situations, and patients will be challenged to use positive emotions to combat negative ones. Special emphasis will be placed on coping with negative emotions in conflict situations, and patients will process healthy conflict resolution skills.  Therapeutic Goals: 1. Patient will identify two positive emotions or experiences to reflect on in order to balance out negative emotions 2. Patient will label two or more emotions that they find the most difficult to experience 3. Patient will demonstrate positive conflict resolution skills through discussion and/or role plays  Summary of Patient Progress: Patient was an active participant in group.  Patient was supportive of other group members.  Patient discussed how cultural differences can negative ly impact a relationship.  Patient discussed how negative feelings have impacted him.  Patient also engaged in the discussion on recent positive emotions and effective coping skills that he has engaged in.     Therapeutic Modalities:   Cognitive Behavioral Therapy Feelings Identification Dialectical Behavioral Therapy  Penni Homans, MSW, LCSW 10/25/2018 2:19 PM

## 2018-10-25 NOTE — Progress Notes (Signed)
[facility] MD Progress Note  10/25/2018 3:05 PM Ileene Rubens [name].  MRN:  [id number] Subjective: Patient with no new complaints.  Says he is gradually continuing to feel stronger.  No longer feeling depressed.  Hopelessness is starting to be more manageable.  No report of suicidal ideation Principal Problem: Alcohol dependence (HCC) Diagnosis: Principal Problem:   Alcohol dependence (HCC) Active Problems:   MDD (major depressive disorder)   GAD (generalized anxiety disorder)   Depression  Total Time spent with patient: 20 minutes  Past Psychiatric History: Long history of alcohol abuse and depression  Past Medical History:  Past Medical History:  Diagnosis Date  . Anxiety   . DDD (degenerative disc disease)   . Depression   . GERD (gastroesophageal reflux disease)   . Hyperlipidemia   . Hypertension   . Psoriasis   . Substance abuse (HCC)   . Substance induced mood disorder (HCC)   . Varicose vein     Past Surgical History:  Procedure Laterality Date  . CHOLECYSTECTOMY    . SINUSOTOMY    . VEIN LIGATION AND STRIPPING     Family History:  Family History  Problem Relation Age of Onset  . Depression Mother   . Anxiety disorder Mother   . Cancer Father    Family Psychiatric  History: See previous Social History:  Social History   Substance and Sexual Activity  Alcohol Use Yes  . Alcohol/week: 1.0 standard drinks  . Types: 1 Cans of beer per week   Comment: bi-weekly     Social History   Substance and Sexual Activity  Drug Use No    Social History   Socioeconomic History  . Marital status: Divorced    Spouse name: Not on file  . Number of children: Not on file  . Years of education: Not on file  . Highest education level: Not on file  Occupational History  . Not on file  Social Needs  . Financial resource strain: Not on file  . Food insecurity:    Worry: Not on file    Inability: Not on file  . Transportation needs:    Medical: Not on file   Non-medical: Not on file  Tobacco Use  . Smoking status: Current Every Day Smoker    Packs/day: 1.00    Types: Cigarettes  . Smokeless tobacco: Never Used  Substance and Sexual Activity  . Alcohol use: Yes    Alcohol/week: 1.0 standard drinks    Types: 1 Cans of beer per week    Comment: bi-weekly  . Drug use: No  . Sexual activity: Not on file  Lifestyle  . Physical activity:    Days per week: Not on file    Minutes per session: Not on file  . Stress: Not on file  Relationships  . Social connections:    Talks on phone: Not on file    Gets together: Not on file    Attends religious service: Not on file    Active member of club or organization: Not on file    Attends meetings of clubs or organizations: Not on file    Relationship status: Not on file  Other Topics Concern  . Not on file  Social History Narrative  . Not on file   Additional Social History:                         Sleep: Good  Appetite:  Good  Current Medications: Current  Facility-Administered Medications  Medication Dose Route Frequency Provider Last Rate Last Dose  . acetaminophen (TYLENOL) tablet 650 mg  650 mg Oral Q6H PRN He, Jun, MD   650 mg at 10/25/18 0944  . alum & mag hydroxide-simeth (MAALOX/MYLANTA) 200-200-20 MG/5ML suspension 30 mL  30 mL Oral Q4H PRN He, Jun, MD      . atorvastatin (LIPITOR) tablet 80 mg  80 mg Oral Daily He, Jun, MD   80 mg at 10/24/18 1635  . folic acid (FOLVITE) tablet 1 mg  1 mg Oral Daily He, Jun, MD   1 mg at 10/25/18 0941  . insulin aspart (novoLOG) injection 0-5 Units  0-5 Units Subcutaneous QHS He, Jun, MD      . insulin aspart (novoLOG) injection 0-9 Units  0-9 Units Subcutaneous TID WC He, Jun, MD   1 Units at 10/25/18 0756  . magnesium hydroxide (MILK OF MAGNESIA) suspension 30 mL  30 mL Oral Daily PRN He, Jun, MD   30 mL at 10/25/18 0944  . meloxicam (MOBIC) tablet 15 mg  15 mg Oral Daily Clapacs, Jackquline DenmarkJohn T, MD   15 mg at 10/25/18 0940  . metFORMIN  (GLUCOPHAGE) tablet 1,000 mg  1,000 mg Oral BID WC He, Jun, MD   1,000 mg at 10/25/18 0940  . midodrine (PROAMATINE) tablet 5 mg  5 mg Oral TID WC Clapacs, Jackquline DenmarkJohn T, MD   5 mg at 10/25/18 1231  . multivitamin with minerals tablet 1 tablet  1 tablet Oral Daily He, Jun, MD   1 tablet at 10/25/18 0940  . nicotine (NICODERM CQ - dosed in mg/24 hours) patch 21 mg  21 mg Transdermal Daily He, Jun, MD   21 mg at 10/25/18 0941  . sertraline (ZOLOFT) tablet 100 mg  100 mg Oral BID Clapacs, Jackquline DenmarkJohn T, MD   100 mg at 10/25/18 0940  . thiamine (VITAMIN B-1) tablet 100 mg  100 mg Oral Daily He, Jun, MD   100 mg at 10/25/18 0940  . traZODone (DESYREL) tablet 50 mg  50 mg Oral QHS Clapacs, Jackquline DenmarkJohn T, MD   50 mg at 10/24/18 2215    Lab Results:  Results for orders placed or performed during the hospital encounter of 10/14/18 (from the past 48 hour(s))  Glucose, capillary     Status: Abnormal   Collection Time: 10/23/18  4:19 PM  Result Value Ref Range   Glucose-Capillary 119 (H) 70 - 99 mg/dL  Glucose, capillary     Status: Abnormal   Collection Time: 10/23/18  9:13 PM  Result Value Ref Range   Glucose-Capillary 105 (H) 70 - 99 mg/dL   Comment 1 Notify RN   Glucose, capillary     Status: Abnormal   Collection Time: 10/24/18  7:02 AM  Result Value Ref Range   Glucose-Capillary 144 (H) 70 - 99 mg/dL   Comment 1 Notify RN   Glucose, capillary     Status: None   Collection Time: 10/24/18 11:26 AM  Result Value Ref Range   Glucose-Capillary 89 70 - 99 mg/dL   Comment 1 Notify RN   Glucose, capillary     Status: Abnormal   Collection Time: 10/24/18  4:29 PM  Result Value Ref Range   Glucose-Capillary 116 (H) 70 - 99 mg/dL  Glucose, capillary     Status: Abnormal   Collection Time: 10/24/18  8:39 PM  Result Value Ref Range   Glucose-Capillary 118 (H) 70 - 99 mg/dL   Comment 1 Notify RN  Glucose, capillary     Status: Abnormal   Collection Time: 10/25/18  6:56 AM  Result Value Ref Range    Glucose-Capillary 122 (H) 70 - 99 mg/dL   Comment 1 Notify RN   Glucose, capillary     Status: Abnormal   Collection Time: 10/25/18 11:43 AM  Result Value Ref Range   Glucose-Capillary 103 (H) 70 - 99 mg/dL    Blood Alcohol level:  Lab Results  Component Value Date   ETH <10 10/13/2018   ETH 298 (H) 08/01/2018    Metabolic Disorder Labs: Lab Results  Component Value Date   HGBA1C 5.6 10/15/2018   MPG 114.02 10/15/2018   MPG 120 (H) 08/08/2013   No results found for: PROLACTIN Lab Results  Component Value Date   CHOL 113 01/14/2018   TRIG 141 01/14/2018   HDL 16 (L) 01/14/2018   CHOLHDL 7.1 01/14/2018   VLDL 28 01/14/2018   LDLCALC 69 01/14/2018    Physical Findings: AIMS: Facial and Oral Movements Muscles of Facial Expression: None, normal Lips and Perioral Area: None, normal Jaw: None, normal Tongue: None, normal,Extremity Movements Upper (arms, wrists, hands, fingers): None, normal Lower (legs, knees, ankles, toes): None, normal, Trunk Movements Neck, shoulders, hips: None, normal, Overall Severity Severity of abnormal movements (highest score from questions above): None, normal Incapacitation due to abnormal movements: None, normal Patient's awareness of abnormal movements (rate only patient's report): No Awareness, Dental Status Current problems with teeth and/or dentures?: No Does patient usually wear dentures?: No  CIWA:  CIWA-Ar Total: 0 COWS:     Musculoskeletal: Strength & Muscle Tone: within normal limits Gait & Station: normal Patient leans: N/A  Psychiatric Specialty Exam: Physical Exam  Nursing note and vitals reviewed. Constitutional: He appears well-developed and well-nourished.  HENT:  Head: Normocephalic and atraumatic.  Eyes: Pupils are equal, round, and reactive to light. Conjunctivae are normal.  Neck: Normal range of motion.  Cardiovascular: Regular rhythm and normal heart sounds.  Respiratory: Effort normal. No respiratory  distress.  GI: Soft.  Musculoskeletal: Normal range of motion.  Neurological: He is alert.  Skin: Skin is warm and dry.  Psychiatric: He has a normal mood and affect. His behavior is normal. Judgment and thought content normal.    Review of Systems  Constitutional: Negative.   HENT: Negative.   Eyes: Negative.   Respiratory: Negative.   Cardiovascular: Negative.   Gastrointestinal: Negative.   Musculoskeletal: Negative.   Skin: Negative.   Neurological: Negative.   Psychiatric/Behavioral: Negative.     Blood pressure 91/63, pulse 61, temperature 97.8 F (36.6 C), temperature source Oral, resp. rate 18, height 6\' 2"  (1.88 m), weight 102.5 kg, SpO2 98 %.Body mass index is 29.02 kg/m.  General Appearance: Disheveled  Eye Contact:  Good  Speech:  Clear and Coherent  Volume:  Decreased  Mood:  Euthymic  Affect:  Constricted  Thought Process:  Goal Directed  Orientation:  Full (Time, Place, and Person)  Thought Content:  Logical  Suicidal Thoughts:  No  Homicidal Thoughts:  No  Memory:  Immediate;   Fair Recent;   Fair Remote;   Fair  Judgement:  Fair  Insight:  Fair  Psychomotor Activity:  Decreased  Concentration:  Concentration: Fair  Recall:  FiservFair  Fund of Knowledge:  Fair  Language:  Fair  Akathisia:  No  Handed:  Right  AIMS (if indicated):     Assets:  Desire for Improvement  ADL's:  Intact  Cognition:  WNL  Sleep:  Number of Hours: 6.75     Treatment Plan Summary: Daily contact with patient to assess and evaluate symptoms and progress in treatment, Medication management and Plan Patient awaiting contact from [facility].  Very much hope we can get him discharged soon.  Meanwhile continue involvement in substance abuse groups continue general health maintenance and current medicine.  Mordecai Rasmussen, MD 10/25/2018, 3:05 PM

## 2018-10-25 NOTE — Plan of Care (Signed)
D- Patient alert and oriented. Patient presents in a pleasant mood on assessment stating that he slept "a little better" last night and still complains of chronic hip and knee pain, in which he rated a "7/10", and he did request pain medication from this Clinical research associate. Patient rated his depression a "9/10" stating that "trying to get out of here and find a place to live" is why he's feeling this way. Patient rated his anxiety a "5/10" stating that it "comes and goes", but it is getting better than previous days. Patient denies SI, HI, AVH, at this time. Patient's goal for today is "getting healthy and go to meetings".  A- Scheduled medications administered to patient, per MD orders. Support and encouragement provided.  Routine safety checks conducted every 15 minutes.  Patient informed to notify staff with problems or concerns.  R- No adverse drug reactions noted. Patient contracts for safety at this time. Patient compliant with medications and treatment plan. Patient receptive, calm, and cooperative. Patient interacts well with others on the unit.  Patient remains safe at this time.    Problem: Education: Goal: Knowledge of [facility] Education information/materials will improve Outcome: Progressing Goal: Emotional status will improve Outcome: Progressing Goal: Mental status will improve Outcome: Progressing   Problem: Activity: Goal: Interest or engagement in activities will improve Outcome: Progressing Goal: Sleeping patterns will improve Outcome: Progressing   Problem: Coping: Goal: Ability to verbalize frustrations and anger appropriately will improve Outcome: Progressing Goal: Ability to demonstrate self-control will improve Outcome: Progressing   Problem: Safety: Goal: Periods of time without injury will increase Outcome: Progressing   Problem: Education: Goal: Ability to make informed decisions regarding treatment will improve Outcome: Progressing   Problem:  Coping: Goal: Coping ability will improve Outcome: Progressing   Problem: Health Behavior/Discharge Planning: Goal: Identification of resources available to assist in meeting health care needs will improve Outcome: Progressing   Problem: Medication: Goal: Compliance with prescribed medication regimen will improve Outcome: Progressing   Problem: Self-Concept: Goal: Ability to disclose and discuss suicidal ideas will improve Outcome: Progressing Goal: Will verbalize positive feelings about self Outcome: Progressing   Problem: Education: Goal: Utilization of techniques to improve thought processes will improve Outcome: Progressing   Problem: Coping: Goal: Coping ability will improve Outcome: Progressing

## 2018-10-26 LAB — GLUCOSE, CAPILLARY
GLUCOSE-CAPILLARY: 123 mg/dL — AB (ref 70–99)
Glucose-Capillary: 151 mg/dL — ABNORMAL HIGH (ref 70–99)
Glucose-Capillary: 109 mg/dL — ABNORMAL HIGH (ref 70–99)
Glucose-Capillary: 110 mg/dL — ABNORMAL HIGH (ref 70–99)

## 2018-10-26 NOTE — BHH Counselor (Signed)
CSW called to follow up with Cory Foster at [facility] regarding 2 potential referrals sources.  CSW was unable to speak with [facility] and left a voicemail message requesting a return call.   Penni Homans, MSW, LCSW 10/26/2018 2:37 PM

## 2018-10-26 NOTE — BHH Group Notes (Signed)
BHH Group Notes:  (Nursing/MHT/Case Management/Adjunct)  Date:  10/26/2018  Time:  9:29 PM  Type of Therapy:  Group Therapy  Participation Level:  Active  Participation Quality:  Appropriate  Affect:  Appropriate  Cognitive:  Alert  Insight:  Good  Engagement in Group:  Engaged  Modes of Intervention:  Support  Summary of Progress/Problems:  Cory Foster 10/26/2018, 9:29 PM

## 2018-10-26 NOTE — Plan of Care (Signed)
Patient present in the milieu with a steady gait. Denies having any thoughts of wanting to harm himself or anyone else. Reports that he slept fair last night without the use of an sleep aid. Appetite is fair , energy level is low. Rates his depression an 8 and anxiety a 9. Patient's goal for today is to work on discharge plans and go to meetings. Patient attend's group with active participation. Milieu remains safe with q 15 minute safety checks.

## 2018-10-26 NOTE — BHH Group Notes (Signed)
LCSW Group Therapy Note  10/26/2018 1:00 PM  Type of Therapy/Topic:  Group Therapy:  Balance in Life  Participation Level:  Minimal  Description of Group:    This group will address the concept of balance and how it feels and looks when one is unbalanced. Patients will be encouraged to process areas in their lives that are out of balance and identify reasons for remaining unbalanced. Facilitators will guide patients in utilizing problem-solving interventions to address and correct the stressor making their life unbalanced. Understanding and applying boundaries will be explored and addressed for obtaining and maintaining a balanced life. Patients will be encouraged to explore ways to assertively make their unbalanced needs known to significant others in their lives, using other group members and facilitator for support and feedback.  Therapeutic Goals: 1. Patient will identify two or more emotions or situations they have that consume much of in their lives. 2. Patient will identify signs/triggers that life has become out of balance:  3. Patient will identify two ways to set boundaries in order to achieve balance in their lives:  4. Patient will demonstrate ability to communicate their needs through discussion and/or role plays  Summary of Patient Progress: Patient was in attendance at group.  Patient shared negative feels being associated with being unbalanced as well as positive feelings associated with balance in one's life.    Therapeutic Modalities:   Cognitive Behavioral Therapy Solution-Focused Therapy Assertiveness Training  Penni Homans MSW, [place] 10/26/2018 2:24 PM

## 2018-10-26 NOTE — Plan of Care (Signed)
  Problem: Education: Goal: Mental status will improve Outcome: Progressing  Patient is alert and oriented x 4.

## 2018-10-26 NOTE — Progress Notes (Signed)
Patient alert and oriented x 4,affect is flat but he brightens upon approach, he forwards very little, denies SI/HI/AVH. Patient's thoughts are organized and coherent, and he is interacting appropriately with peers and staff. Patient appears less anxious, rated his  depression a 5/10 on a scale ( 0 low - high 10)  he attended evening wrap up group and interacted appropriately with peers and staff. 15 minutes safety checks maintained will continue to monitor.

## 2018-10-26 NOTE — BHH Counselor (Signed)
CSW spoke with the patient regarding his scheduled follow up phone call from [facility].  Patient reports that he was told that paperwork was needed. Patient and CSW were both confused.  CSW called [facility] for clarification and was informed that the patient did not have his initial interview completed.  Pt is adamant that he called on Monday and spoke with a male staff member and CSW confirmed that she provided the name and number of who the patient needed to speak with to his nurse on Monday.  CSW asked if the interview could be completed now.  The interview was completed via the phone, however, the patient indicated that he could not afford the service due to the required sliding scale from the [facility] and [facility].    Shayla in [facility] admission reports that she will contact several other programs and assess for bed availability and call this CSW back.  Penni Homans, MSW, LCSW 10/26/2018 11:05 AM

## 2018-10-26 NOTE — Progress Notes (Signed)
Recreation Therapy Notes  Date: 10/26/2018  Time: 9:30 am  Location: Craft Room  Behavioral response: Appropriate  Intervention Topic: Strengths  Discussion/Intervention:  Group content today was focused on strengths. The group identified some of the strengths they have. Individuals stated reason why they do not use their strengths. Patients expressed what strengths others see in them. The group identified important reason to use their strengths. The group participated in the intervention "Picking strengths", where they had a chance to identify some of their strengths. Clinical Observations/Feedback:  Patient came to group late due to unknown reasons. Individual was social with peers and staff while participating in the intervention. Yuridiana Formanek LRT/CTRS         Salvatrice Morandi 10/26/2018 10:36 AM

## 2018-10-26 NOTE — Progress Notes (Signed)
[facility] MD Progress Note  10/26/2018 10:54 PM Cory Foster Cory Foster.  MRN:  [id number] Subjective: No new complaints.  Not suicidal not psychotic.  Still complains a lot about knee pain.  Patient was turned down by [facility] and cannot afford to go to our cup.  Only choice is to wait for his check. Principal Problem: Alcohol dependence (HCC) Diagnosis: Principal Problem:   Alcohol dependence (HCC) Active Problems:   MDD (major depressive disorder)   GAD (generalized anxiety disorder)   Depression  Total Time spent with patient: 20 minutes  Past Psychiatric History: chronic alcohol  Past Medical History:  Past Medical History:  Diagnosis Date  . Anxiety   . DDD (degenerative disc disease)   . Depression   . GERD (gastroesophageal reflux disease)   . Hyperlipidemia   . Hypertension   . Psoriasis   . Substance abuse (HCC)   . Substance induced mood disorder (HCC)   . Varicose vein     Past Surgical History:  Procedure Laterality Date  . CHOLECYSTECTOMY    . SINUSOTOMY    . VEIN LIGATION AND STRIPPING     Family History:  Family History  Problem Relation Age of Onset  . Depression Mother   . Anxiety disorder Mother   . Cancer Father    Family Psychiatric  History: none Social History:  Social History   Substance and Sexual Activity  Alcohol Use Yes  . Alcohol/week: 1.0 standard drinks  . Types: 1 Cans of beer per week   Comment: bi-weekly     Social History   Substance and Sexual Activity  Drug Use No    Social History   Socioeconomic History  . Marital status: Divorced    Spouse name: Not on file  . Number of children: Not on file  . Years of education: Not on file  . Highest education level: Not on file  Occupational History  . Not on file  Social Needs  . Financial resource strain: Not on file  . Food insecurity:    Worry: Not on file    Inability: Not on file  . Transportation needs:    Medical: Not on file    Non-medical: Not on file  Tobacco  Use  . Smoking status: Current Every Day Smoker    Packs/day: 1.00    Types: Cigarettes  . Smokeless tobacco: Never Used  Substance and Sexual Activity  . Alcohol use: Yes    Alcohol/week: 1.0 standard drinks    Types: 1 Cans of beer per week    Comment: bi-weekly  . Drug use: No  . Sexual activity: Not on file  Lifestyle  . Physical activity:    Days per week: Not on file    Minutes per session: Not on file  . Stress: Not on file  Relationships  . Social connections:    Talks on phone: Not on file    Gets together: Not on file    Attends religious service: Not on file    Active member of club or organization: Not on file    Attends meetings of clubs or organizations: Not on file    Relationship status: Not on file  Other Topics Concern  . Not on file  Social History Narrative  . Not on file   Additional Social History:                         Sleep: Good  Appetite:  Good  Current Medications: Current Facility-Administered Medications  Medication Dose Route Frequency Provider Last Rate Last Dose  . acetaminophen (TYLENOL) tablet 650 mg  650 mg Oral Q6H PRN He, Jun, MD   650 mg at 10/25/18 2156  . alum & mag hydroxide-simeth (MAALOX/MYLANTA) 200-200-20 MG/5ML suspension 30 mL  30 mL Oral Q4H PRN He, Jun, MD      . atorvastatin (LIPITOR) tablet 80 mg  80 mg Oral Daily He, Jun, MD   80 mg at 10/26/18 1702  . folic acid (FOLVITE) tablet 1 mg  1 mg Oral Daily He, Jun, MD   1 mg at 10/26/18 [id number]  . insulin aspart (novoLOG) injection 0-5 Units  0-5 Units Subcutaneous QHS He, Jun, MD      . insulin aspart (novoLOG) injection 0-9 Units  0-9 Units Subcutaneous TID WC He, Jun, MD   1 Units at 10/26/18 1129  . magnesium hydroxide (MILK OF MAGNESIA) suspension 30 mL  30 mL Oral Daily PRN He, Jun, MD   30 mL at 10/25/18 0944  . meloxicam (MOBIC) tablet 15 mg  15 mg Oral Daily Rodell Marrs, Jackquline DenmarkJohn T, MD   15 mg at 10/26/18 [id number]  . metFORMIN (GLUCOPHAGE) tablet 1,000 mg  1,000 mg  Oral BID WC He, Jun, MD   1,000 mg at 10/26/18 1702  . midodrine (PROAMATINE) tablet 5 mg  5 mg Oral TID WC Nneka Blanda, Jackquline DenmarkJohn T, MD   5 mg at 10/26/18 1703  . multivitamin with minerals tablet 1 tablet  1 tablet Oral Daily He, Jun, MD   1 tablet at 10/26/18 0821  . nicotine (NICODERM CQ - dosed in mg/24 hours) patch 21 mg  21 mg Transdermal Daily He, Jun, MD   21 mg at 10/26/18 0820  . sertraline (ZOLOFT) tablet 100 mg  100 mg Oral BID Jerris Fleer, Jackquline DenmarkJohn T, MD   100 mg at 10/26/18 1702  . thiamine (VITAMIN B-1) tablet 100 mg  100 mg Oral Daily He, Jun, MD   100 mg at 10/26/18 [id number]  . traZODone (DESYREL) tablet 50 mg  50 mg Oral QHS Adlyn Fife, Jackquline DenmarkJohn T, MD   50 mg at 10/26/18 2119    Lab Results:  Results for orders placed or performed during the hospital encounter of 10/14/18 (from the past 48 hour(s))  Glucose, capillary     Status: Abnormal   Collection Time: 10/25/18  6:56 AM  Result Value Ref Range   Glucose-Capillary 122 (H) 70 - 99 mg/dL   Comment 1 Notify RN   Glucose, capillary     Status: Abnormal   Collection Time: 10/25/18 11:43 AM  Result Value Ref Range   Glucose-Capillary 103 (H) 70 - 99 mg/dL  Glucose, capillary     Status: Abnormal   Collection Time: 10/25/18  4:15 PM  Result Value Ref Range   Glucose-Capillary 109 (H) 70 - 99 mg/dL  Glucose, capillary     Status: Abnormal   Collection Time: 10/25/18  9:02 PM  Result Value Ref Range   Glucose-Capillary 100 (H) 70 - 99 mg/dL  Glucose, capillary     Status: Abnormal   Collection Time: 10/26/18  7:02 AM  Result Value Ref Range   Glucose-Capillary 151 (H) 70 - 99 mg/dL   Comment 1 Notify RN   Glucose, capillary     Status: Abnormal   Collection Time: 10/26/18 11:20 AM  Result Value Ref Range   Glucose-Capillary 123 (H) 70 - 99 mg/dL  Glucose, capillary     Status: Abnormal  Collection Time: 10/26/18  4:23 PM  Result Value Ref Range   Glucose-Capillary 109 (H) 70 - 99 mg/dL  Glucose, capillary     Status: Abnormal    Collection Time: 10/26/18  8:25 PM  Result Value Ref Range   Glucose-Capillary 110 (H) 70 - 99 mg/dL   Comment 1 Notify RN     Blood Alcohol level:  Lab Results  Component Value Date   ETH <10 10/13/2018   ETH 298 (H) 08/01/2018    Metabolic Disorder Labs: Lab Results  Component Value Date   HGBA1C 5.6 10/15/2018   MPG 114.02 10/15/2018   MPG 120 (H) 08/08/2013   No results found for: PROLACTIN Lab Results  Component Value Date   CHOL 113 01/14/2018   TRIG 141 01/14/2018   HDL 16 (L) 01/14/2018   CHOLHDL 7.1 01/14/2018   VLDL 28 01/14/2018   LDLCALC 69 01/14/2018    Physical Findings: AIMS: Facial and Oral Movements Muscles of Facial Expression: None, normal Lips and Perioral Area: None, normal Jaw: None, normal Tongue: None, normal,Extremity Movements Upper (arms, wrists, hands, fingers): None, normal Lower (legs, knees, ankles, toes): None, normal, Trunk Movements Neck, shoulders, hips: None, normal, Overall Severity Severity of abnormal movements (highest score from questions above): None, normal Incapacitation due to abnormal movements: None, normal Patient's awareness of abnormal movements (rate only patient's report): No Awareness, Dental Status Current problems with teeth and/or dentures?: No Does patient usually wear dentures?: No  CIWA:  CIWA-Ar Total: 0 COWS:     Musculoskeletal: Strength & Muscle Tone: within normal limits Gait & Station: normal Patient leans: N/A  Psychiatric Specialty Exam: Physical Exam  Nursing note and vitals reviewed. Constitutional: He appears well-developed and well-nourished.  HENT:  Head: Normocephalic and atraumatic.  Eyes: Pupils are equal, round, and reactive to light. Conjunctivae are normal.  Neck: Normal range of motion.  Cardiovascular: Regular rhythm and normal heart sounds.  Respiratory: Effort normal. No respiratory distress.  GI: Soft.  Musculoskeletal: Normal range of motion.  Neurological: He is alert.   Skin: Skin is warm and dry.  Psychiatric: He has a normal mood and affect. His behavior is normal. Judgment and thought content normal.    Review of Systems  Constitutional: Negative.   HENT: Negative.   Eyes: Negative.   Respiratory: Negative.   Cardiovascular: Negative.   Gastrointestinal: Negative.   Musculoskeletal: Negative.   Skin: Negative.   Neurological: Negative.   Psychiatric/Behavioral: Negative.     Blood pressure 111/76, pulse (!) 52, temperature 98.1 F (36.7 C), temperature source Oral, resp. rate 18, height 6\' 2"  (1.88 m), weight 102.5 kg, SpO2 100 %.Body mass index is 29.02 kg/m.  General Appearance: Casual  Eye Contact:  Fair  Speech:  Normal Rate  Volume:  Normal  Mood:  Euthymic  Affect:  Constricted  Thought Process:  Goal Directed  Orientation:  Full (Time, Place, and Person)  Thought Content:  Logical  Suicidal Thoughts:  No  Homicidal Thoughts:  No  Memory:  Immediate;   Fair Recent;   Fair Remote;   Fair  Judgement:  Fair  Insight:  Fair  Psychomotor Activity:  Decreased  Concentration:  Concentration: Fair  Recall:  [employer] of Knowledge:  Fair  Language:  Fair  Akathisia:  No  Handed:  Right  AIMS (if indicated):     Assets:  Desire for Improvement  ADL's:  Intact  Cognition:  WNL  Sleep:  Number of Hours: 6.15  Treatment Plan Summary: Plan Discharge once he has his check  Mordecai RasmussenJohn Kashina Mecum, MD 10/26/2018, 10:54 PM

## 2018-10-26 NOTE — Plan of Care (Signed)
Cooperative with treatment, interacted well with peers and staff on unit. Pleasant on approach and medication compliant.  No behavioral issues to report on shift at this time.

## 2018-10-27 LAB — GLUCOSE, CAPILLARY
GLUCOSE-CAPILLARY: 126 mg/dL — AB (ref 70–99)
GLUCOSE-CAPILLARY: 155 mg/dL — AB (ref 70–99)
GLUCOSE-CAPILLARY: 101 mg/dL — AB (ref 70–99)
Glucose-Capillary: 105 mg/dL — ABNORMAL HIGH (ref 70–99)

## 2018-10-27 NOTE — BHH Group Notes (Signed)
LCSW Group Therapy Note  10/27/2018 2:23 PM  Type of Therapy and Topic:  Group Therapy:  Feelings around Relapse and Recovery  Participation Level:  Active   Description of Group:    Patients in this group will discuss emotions they experience before and after a relapse. They will process how experiencing these feelings, or avoidance of experiencing them, relates to having a relapse. Facilitator will guide patients to explore emotions they have related to recovery. Patients will be encouraged to process which emotions are more powerful. They will be guided to discuss the emotional reaction significant others in their lives may have to their relapse or recovery. Patients will be assisted in exploring ways to respond to the emotions of others without this contributing to a relapse.  Therapeutic Goals: 1. Patient will identify two or more emotions that lead to a relapse for them 2. Patient will identify two emotions that result when they relapse 3. Patient will identify two emotions related to recovery 4. Patient will demonstrate ability to communicate their needs through discussion and/or role plays   Summary of Patient Progress: Pt was appropriate in group. Pt was active in the discussion and was able to identify ways to prevent relapse and maintain recovery. Pt reported that entertainment can cause people to relapse and that he plans to go to meetings and therapy to maintain his recovery.    Therapeutic Modalities:   Cognitive Behavioral Therapy Solution-Focused Therapy Assertiveness Training Relapse Prevention Therapy   Iris Pert, MSW, LCSW Clinical Social Work 10/27/2018 2:23 PM

## 2018-10-27 NOTE — BHH Group Notes (Signed)
BHH Group Notes:  (Nursing/MHT/Case Management/Adjunct)  Date:  10/27/2018  Time:  2:50 PM  Type of Therapy:  Psychoeducational Skills  Participation Level:  Active  Participation Quality:  Appropriate, Attentive and Sharing  Affect:  Appropriate  Cognitive:  Alert, Appropriate and Oriented  Insight:  Good  Engagement in Group:  Engaged  Modes of Intervention:  Discussion, Education and Exploration  Summary of Progress/Problems:  Foy GuadalajaraJasmine Foster [name] 10/27/2018, 2:50 PM

## 2018-10-27 NOTE — BHH Counselor (Signed)
CSW spoke with the patient and informed that he needed to check his accounts to determine if his check came today and if so he would go home.    Pt agreed.  CSW will follow up.   Penni Homans, MSW, LCSW 10/27/2018 9:06 AM

## 2018-10-27 NOTE — Progress Notes (Signed)
[facility]  10/27/2018 5:39 PM Cory Greathouseandall Cory Foster Cory Jr.  MRN:  [id number] Subjective: Follow-up patient with alcohol abuse and depression.  No new complaints.  Mood stable.  Not suicidal.  Physically stable.  Awaiting the ability to be discharged Principal Problem: Alcohol dependence (HCC) Diagnosis: Principal Problem:   Alcohol dependence (HCC) Active Problems:   MDD (major depressive disorder)   GAD (generalized anxiety disorder)   Depression  Total Time spent with patient: 15 minutes  Past Psychiatric History: Alcohol abuse and depression  Past Medical History:  Past Medical History:  Diagnosis Date  . Anxiety   . DDD (degenerative disc disease)   . Depression   . GERD (gastroesophageal reflux disease)   . Hyperlipidemia   . Hypertension   . Psoriasis   . Substance abuse (HCC)   . Substance induced mood disorder (HCC)   . Varicose vein     Past Surgical History:  Procedure Laterality Date  . CHOLECYSTECTOMY    . SINUSOTOMY    . VEIN LIGATION AND STRIPPING     Family History:  Family History  Problem Relation Age of Onset  . Depression Mother   . Anxiety disorder Mother   . Cancer Father    Family Psychiatric  History: None Social History:  Social History   Substance and Sexual Activity  Alcohol Use Yes  . Alcohol/week: 1.0 standard drinks  . Types: 1 Cans of beer per week   Comment: bi-weekly     Social History   Substance and Sexual Activity  Drug Use No    Social History   Socioeconomic History  . Marital status: Divorced    Spouse name: Not on file  . Number of children: Not on file  . Years of education: Not on file  . Highest education level: Not on file  Occupational History  . Not on file  Social Needs  . Financial resource strain: Not on file  . Food insecurity:    Worry: Not on file    Inability: Not on file  . Transportation needs:    Medical: Not on file    Non-medical: Not on file  Tobacco Use  . Smoking status:  Current Every Day Smoker    Packs/day: 1.00    Types: Cigarettes  . Smokeless tobacco: Never Used  Substance and Sexual Activity  . Alcohol use: Yes    Alcohol/week: 1.0 standard drinks    Types: 1 Cans of beer per week    Comment: bi-weekly  . Drug use: No  . Sexual activity: Not on file  Lifestyle  . Physical activity:    Days per week: Not on file    Minutes per session: Not on file  . Stress: Not on file  Relationships  . Social connections:    Talks on phone: Not on file    Gets together: Not on file    Attends religious service: Not on file    Active member of club or organization: Not on file    Attends meetings of clubs or organizations: Not on file    Relationship status: Not on file  Other Topics Concern  . Not on file  Social History Narrative  . Not on file   Additional Social History:                         Sleep: Fair  Appetite:  Fair  Current Medications: Current Facility-Administered Medications  Medication Dose Route Frequency Provider  Last Rate Last Dose  . acetaminophen (TYLENOL) tablet 650 mg  650 mg Oral Q6H PRN He, Jun, MD   650 mg at 10/25/18 2156  . alum & mag hydroxide-simeth (MAALOX/MYLANTA) 200-200-20 MG/5ML suspension 30 mL  30 mL Oral Q4H PRN He, Jun, MD      . atorvastatin (LIPITOR) tablet 80 mg  80 mg Oral Daily He, Jun, MD   80 mg at 10/27/18 1711  . folic acid (FOLVITE) tablet 1 mg  1 mg Oral Daily He, Jun, MD   1 mg at 10/27/18 0802  . insulin aspart (novoLOG) injection 0-5 Units  0-5 Units Subcutaneous QHS He, Jun, MD      . insulin aspart (novoLOG) injection 0-9 Units  0-9 Units Subcutaneous TID WC He, Jun, MD   2 Units at 10/27/18 1245  . magnesium hydroxide (MILK OF MAGNESIA) suspension 30 mL  30 mL Oral Daily PRN He, Jun, MD   30 mL at 10/25/18 0944  . meloxicam (MOBIC) tablet 15 mg  15 mg Oral Daily , Jackquline Denmark T, MD   15 mg at 10/27/18 0802  . metFORMIN (GLUCOPHAGE) tablet 1,000 mg  1,000 mg Oral BID WC He, Jun, MD    1,000 mg at 10/27/18 1711  . midodrine (PROAMATINE) tablet 5 mg  5 mg Oral TID WC , Jackquline Denmark T, MD   5 mg at 10/27/18 1712  . multivitamin with minerals tablet 1 tablet  1 tablet Oral Daily He, Jun, MD   1 tablet at 10/27/18 0802  . nicotine (NICODERM CQ - dosed in mg/24 hours) patch 21 mg  21 mg Transdermal Daily He, Jun, MD   21 mg at 10/27/18 0800  . sertraline (ZOLOFT) tablet 100 mg  100 mg Oral BID , Jackquline Denmark T, MD   100 mg at 10/27/18 1711  . thiamine (VITAMIN B-1) tablet 100 mg  100 mg Oral Daily He, Jun, MD   100 mg at 10/27/18 0803  . traZODone (DESYREL) tablet 50 mg  50 mg Oral QHS ,  T, MD   50 mg at 10/26/18 2119    Lab Results:  Results for orders placed or performed during the Foster encounter of 10/14/18 (from the past 48 hour(s))  Glucose, capillary     Status: Abnormal   Collection Time: 10/25/18  9:02 PM  Result Value Ref Range   Glucose-Capillary 100 (H) 70 - 99 mg/dL  Glucose, capillary     Status: Abnormal   Collection Time: 10/26/18  7:02 AM  Result Value Ref Range   Glucose-Capillary 151 (H) 70 - 99 mg/dL   Comment 1 Notify RN   Glucose, capillary     Status: Abnormal   Collection Time: 10/26/18 11:20 AM  Result Value Ref Range   Glucose-Capillary 123 (H) 70 - 99 mg/dL  Glucose, capillary     Status: Abnormal   Collection Time: 10/26/18  4:23 PM  Result Value Ref Range   Glucose-Capillary 109 (H) 70 - 99 mg/dL  Glucose, capillary     Status: Abnormal   Collection Time: 10/26/18  8:25 PM  Result Value Ref Range   Glucose-Capillary 110 (H) 70 - 99 mg/dL   Comment 1 Notify RN   Glucose, capillary     Status: Abnormal   Collection Time: 10/27/18  7:01 AM  Result Value Ref Range   Glucose-Capillary 126 (H) 70 - 99 mg/dL  Glucose, capillary     Status: Abnormal   Collection Time: 10/27/18 11:14 AM  Result Value Ref  Range   Glucose-Capillary 155 (H) 70 - 99 mg/dL  Glucose, capillary     Status: Abnormal   Collection Time: 10/27/18  4:08 PM   Result Value Ref Range   Glucose-Capillary 101 (H) 70 - 99 mg/dL    Blood Alcohol level:  Lab Results  Component Value Date   ETH <10 10/13/2018   ETH 298 (H) 08/01/2018    Metabolic Disorder Labs: Lab Results  Component Value Date   HGBA1C 5.6 10/15/2018   MPG 114.02 10/15/2018   MPG 120 (H) 08/08/2013   No results found for: PROLACTIN Lab Results  Component Value Date   CHOL 113 01/14/2018   TRIG 141 01/14/2018   HDL 16 (L) 01/14/2018   CHOLHDL 7.1 01/14/2018   VLDL 28 01/14/2018   LDLCALC 69 01/14/2018    Physical Findings: AIMS: Facial and Oral Movements Muscles of Facial Expression: None, normal Lips and Perioral Area: None, normal Jaw: None, normal Tongue: None, normal,Extremity Movements Upper (arms, wrists, hands, fingers): None, normal Lower (legs, knees, ankles, toes): None, normal, Trunk Movements Neck, shoulders, hips: None, normal, Overall Severity Severity of abnormal movements (highest score from questions above): None, normal Incapacitation due to abnormal movements: None, normal Patient's awareness of abnormal movements (rate only patient's report): No Awareness, Dental Status Current problems with teeth and/or dentures?: No Does patient usually wear dentures?: No  CIWA:  CIWA-Ar Total: 0 COWS:     Musculoskeletal: Strength & Muscle Tone: within normal limits Gait & Station: normal Patient leans: N/A  Psychiatric Specialty Exam: Physical Exam  Constitutional: He appears well-developed and well-nourished.  HENT:  Head: Normocephalic and atraumatic.  Eyes: Pupils are equal, round, and reactive to light. Conjunctivae are normal.  Neck: Normal range of motion.  Cardiovascular: Normal heart sounds.  Respiratory: Effort normal.  GI: Soft.  Musculoskeletal: Normal range of motion.  Neurological: He is alert.  Skin: Skin is warm and dry.  Psychiatric: He has a normal mood and affect. His behavior is normal. Judgment and thought content  normal.    Review of Systems  Constitutional: Negative.   HENT: Negative.   Eyes: Negative.   Respiratory: Negative.   Cardiovascular: Negative.   Gastrointestinal: Negative.   Musculoskeletal: Negative.   Skin: Negative.   Neurological: Negative.   Psychiatric/Behavioral: Negative.     Blood pressure 107/70, pulse (!) 56, temperature 97.8 F (36.6 C), temperature source Oral, resp. rate 18, height 6\' 2"  (1.88 m), weight 102.5 kg, SpO2 98 %.Body mass index is 29.02 kg/m.  General Appearance: Casual  Eye Contact:  Good  Speech:  Clear and Coherent  Volume:  Normal  Mood:  Euthymic  Affect:  Constricted  Thought Process:  Goal Directed  Orientation:  Full (Time, Place, and Person)  Thought Content:  Logical  Suicidal Thoughts:  No  Homicidal Thoughts:  No  Memory:  Immediate;   Fair Recent;   Fair Remote;   Fair  Judgement:  Fair  Insight:  Fair  Psychomotor Activity:  Decreased  Concentration:  Concentration: Fair  Recall:  [employer] of Knowledge:  Fair  Language:  Fair  Akathisia:  No  Handed:  Right  AIMS (if indicated):     Assets:  Desire for Improvement Housing Physical Health  ADL's:  Intact  Cognition:  WNL  Sleep:  Number of Hours: 7.15     Treatment Plan Summary: Plan As soon as the patient gets his money he can be discharged.  I might hope that that could even  be over the weekend.  Otherwise  Mordecai Rasmussen, MD 10/27/2018, 5:39 PM

## 2018-10-27 NOTE — Progress Notes (Addendum)
Recreation Therapy Notes   Date: 10/27/2018  Time: 9:30 am  Location: Craft Room  Behavioral response: Appropriate  Intervention Topic: Teamwork  Discussion/Intervention:  Group content on today was focused on teamwork. The group identified what teamwork is. Individuals described who is a part of their team. Patients expressed why they thought teamwork is important. The group stated reasons why they thought it was easier to work with a Comptroller team. Individuals discussed some positives and negatives of working with a team. Patients gave examples of past experiences they had while working with a team. The group participated in the intervention "What is That", where patients were given a chance to point out qualities they look for in a team mate and were able to work in teams with each other. Clinical Observations/Feedback:  Patient came to group and stated teamwork is working together. 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  Individual was social with peers and staff while participating in the intervention. Barron Vanloan LRT/CTRS         Mathias Bogacki 10/27/2018 2:33 PM

## 2018-10-27 NOTE — BHH Counselor (Signed)
CSW called [facility] to speak with Lonni Fix in Admissions [phone].  CSW inquired how the phone interview went.  CSW was informed that the [facility] nurse was concerned about the patient being to acute medically and while no official denial had been given it was likely to come.  Drema Pry reports that she will follow up by 10AM once the nurse has came to the office.  Shayla recommended the [employer] and [facility] as other referrals.  CSW informed that patient had already been denied by [facility].  Penni Homans, MSW, LCSW 10/27/2018 9:05 AM

## 2018-10-27 NOTE — Progress Notes (Signed)
DAR Note: Pt visible in milieu during shift. A & O X3. Denies SI, HI, AVH and pain when assessed "I'm ready to go home now". Remains medication compliant. Denies side effects / concerns at this time. Pleasant, brightens up on interactions. Attended scheduled groups and was engaged in discussions and activities.  Emotional support and encouragement provided to pt throughout this shift. All medications given per MD's orders with verbal education and effects monitored. Safety checks maintained without self harm gestures or outburst to note thus far. Pt receptive to care. Tolerates all PO intake well. Remains safe on unit.

## 2018-10-28 LAB — GLUCOSE, CAPILLARY
GLUCOSE-CAPILLARY: 149 mg/dL — AB (ref 70–99)
Glucose-Capillary: 116 mg/dL — ABNORMAL HIGH (ref 70–99)

## 2018-10-28 MED ORDER — MIDODRINE HCL 5 MG PO TABS: 5.0000 mg | ORAL_TABLET | Freq: Three times a day (TID) | ORAL | 0 refills | Status: AC

## 2018-10-28 MED ORDER — SERTRALINE HCL 100 MG PO TABS: 100.0000 mg | ORAL_TABLET | Freq: Two times a day (BID) | ORAL | 0 refills | Status: DC

## 2018-10-28 MED ORDER — METFORMIN HCL 1000 MG PO TABS: 1000.0000 mg | ORAL_TABLET | Freq: Two times a day (BID) | ORAL | 0 refills | Status: DC

## 2018-10-28 MED ORDER — THIAMINE HCL 100 MG PO TABS: 100.0000 mg | ORAL_TABLET | Freq: Every day | ORAL | 0 refills | Status: AC

## 2018-10-28 MED ORDER — NICOTINE 21 MG/24HR TD PT24: 21.0000 mg | MEDICATED_PATCH | Freq: Every day | TRANSDERMAL | 0 refills | Status: DC

## 2018-10-28 MED ORDER — TRAZODONE HCL 50 MG PO TABS: 50.0000 mg | ORAL_TABLET | Freq: Every day | ORAL | 0 refills | Status: DC

## 2018-10-28 NOTE — Plan of Care (Signed)
  Problem: Education: Goal: Ability to make informed decisions regarding treatment will improve Outcome: Progressing  Patient can make informed decisions on treatment plan

## 2018-10-28 NOTE — BHH Group Notes (Signed)
LCSW Group Therapy Note   10/28/2018 1:15pm   Type of Therapy and Topic:  Group Therapy:  Trust and Honesty  Participation Level:  Did Not Attend  Description of Group:    In this group patients will be asked to explore the value of being honest.  Patients will be guided to discuss their thoughts, feelings, and behaviors related to honesty and trusting in others. Patients will process together how trust and honesty relate to forming relationships with peers, family members, and self. Each patient will be challenged to identify and express feelings of being vulnerable. Patients will discuss reasons why people are dishonest and identify alternative outcomes if one was truthful (to self or others). This group will be process-oriented, with patients participating in exploration of their own experiences, giving and receiving support, and processing challenge from other group members.   Therapeutic Goals: 1. Patient will identify why honesty is important to relationships and how honesty overall affects relationships.  2. Patient will identify a situation where they lied or were lied too and the  feelings, thought process, and behaviors surrounding the situation 3. Patient will identify the meaning of being vulnerable, how that feels, and how that correlates to being honest with self and others. 4. Patient will identify situations where they could have told the truth, but instead lied and explain reasons of dishonesty.   Summary of Patient Progress: Pt was invited to attend group but chose not to attend. CSW will continue to encourage pt to attend group throughout their admission.     Therapeutic Modalities:   Cognitive Behavioral Therapy Solution Focused Therapy Motivational Interviewing Brief Therapy  Amol Domanski  CUEBAS-COLON, LCSW 10/28/2018 1:06 PM

## 2018-10-28 NOTE — Plan of Care (Signed)
  Problem: Education: Goal: Knowledge of [place] [facility] Education information/materials will improve Outcome: Progressing Goal: Emotional status will improve Outcome: Progressing Goal: Mental status will improve Outcome: Progressing   Problem: Activity: Goal: Interest or engagement in activities will improve Outcome: Progressing Goal: Sleeping patterns will improve Outcome: Progressing   Problem: Coping: Goal: Ability to verbalize frustrations and anger appropriately will improve Outcome: Progressing Goal: Ability to demonstrate self-control will improve Outcome: Progressing

## 2018-10-28 NOTE — Progress Notes (Signed)
  [facility] Adult Case Management Discharge Plan :  Will you be returning to the same living situation after discharge:  Yes,  pt will go to [facility] for a few days and apply for housing At discharge, do you have transportation home?: Yes,  CSW provided taxi voucher  Do you have the ability to pay for your medications: Yes,  pt has insurance  Release of information consent forms completed and in the chart;  Patient's signature needed at discharge.  Patient to Follow up at: Follow-up Information    [facility] Follow up.   Specialty:  Addiction Medicine Contact information: [address] [place] [address] [phone]        [facility] Follow up.   Specialty[facility]  [facility] information: [address] [place] [address] [phone]        [facility] Follow up.   Contact information: [address] [place] [address] [phone]        [facility] Go on 10/30/2018.   Why:  Patient will go to walk-in clinic for new pt assessement between [facility] and Friday.  Contact information: [address] [place] [address] [phone]           Next level of care provider has access to [facility] Link:yes  Safety Planning and Suicide Prevention discussed: Yes,  CSW discussed with pt  Have you used any form of tobacco in the last 30 days? (Cigarettes, Smokeless Tobacco, Cigars, and/or Pipes): Yes  Has patient been referred to the Quitline?: N/A patient is not a smoker  Patient has been referred for addiction treatment: Yes  Blakelyn Dinges  CUEBAS-COLON, LCSW 10/28/2018, 2:25 PM
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
## 2018-10-28 NOTE — Progress Notes (Signed)
Patient alert and oriented x 4, denies SI/HI/AVH affect is flat but he brightens upon interaction. Patient is apprehensive he wants to go home, he stated " l am tired of being here" he appears less anxious and he is interacting appropriately with peers and staff. Patient attended evening group and was compliant with medication. 15 minutes safety checks maintained will continue to monitor.

## 2018-10-28 NOTE — Progress Notes (Signed)
Patient discharged home. DC instructions provided and explained. Medications reviewed. Rx given. All questions answered. Transition and suicide risk assessment given. All questions answered. Denies SI, HI, AVH. Left via cab in stable condition.

## 2018-10-28 NOTE — Progress Notes (Signed)
  [facility] Adult Case Management Discharge Plan :  Will you be returning to the same living situation after discharge:  Yes,  pt will go home with mom At discharge, do you have transportation home?: Yes,  mom Do you have the ability to pay for your medications: Yes,  pt has insurance  Release of information consent forms completed and in the chart;  Patient's signature needed at discharge.  Patient to Follow up at: Follow-up Information    [facility] Follow up.   Specialty:  Addiction Medicine Contact information: [address] [place] [address] [phone]        [facility] Follow up.   Specialty:  [facility] information: [address] [place] [address] [phone]        [facility] Follow up.   Contact information: [address] [place] [address] [phone]        [facility] Go on 10/30/2018.   Why:  Patient will go to walk-in clinic for new pt assessement between [facility] and Friday.  Contact information: [address] [place] [address] [phone]           Next level of care provider has access to [facility] Link:no  Safety Planning and Suicide Prevention discussed: Yes,  CSW discussed with pt  Have you used any form of tobacco in the last 30 days? (Cigarettes, Smokeless Tobacco, Cigars, and/or Pipes): Yes  Has patient been referred to the Quitline?: N/A patient is not a smoker  Patient has been referred for addiction treatment: Yes  Shaqueena Mauceri  CUEBAS-COLON, LCSW 10/28/2018, 1:17 PM

## 2018-10-28 NOTE — [facility]HH Suicide Risk Assessment (Signed)
[facility] Admission Suicide Risk Assessment   Nursing information obtained from:  Patient Demographic factors:  Male, Caucasian Current Mental Status:  Suicidal ideation indicated by patient Loss Factors:  Legal issues, Loss of significant relationship Historical Factors:  NA Risk Reduction Factors:  Positive coping skills or problem solving skills  Total Time spent with patient: 45 minutes Principal Problem: Alcohol dependence (HCC) Diagnosis:  Principal Problem:   Alcohol dependence (HCC) Active Problems:   MDD (major depressive disorder)   GAD (generalized anxiety disorder)   Depression  Subjective Data: Feels improved, no SI no passive SI, has plans  Continued Clinical Symptoms:    The "Alcohol Use Disorders Identification Test", Guidelines for Use in Primary Care, Second Edition.  World Science writer [facility]). Score between 0-7:  no or low risk or alcohol related problems. Score between 8-15:  moderate risk of alcohol related problems. Score between 16-19:  high risk of alcohol related problems. Score 20 or above:  warrants further diagnostic evaluation for alcohol dependence and treatment.   CLINICAL FACTORS:   Alcohol/Substance Abuse/Dependencies  Musculoskeletal: Strength & Muscle Tone: within normal limits Gait & Station: normal Patient leans: N/A  Psychiatric Specialty Exam: Physical Exam  Constitutional: He appears well-developed.  HENT:  Head: Normocephalic.  Eyes: Pupils are equal, round, and reactive to light.  Neck: Normal range of motion.  Cardiovascular: Normal rate and regular rhythm.  Respiratory: Effort normal.  Musculoskeletal: Normal range of motion.  Neurological: He is alert.  Skin: Skin is warm.  Psychiatric: He has a normal mood and affect.    ROS  Blood pressure 102/69, pulse 72, temperature 98.4 F (36.9 C), temperature source Oral, resp. rate 18, height 6\' 2"  (1.88 m), weight 102.5 kg, SpO2 96 %.Body mass index is 29.02 kg/m.  General  Appearance: Casual and Fairly Groomed  Eye Contact:  Good  Speech:  Clear and Coherent  Volume:  Normal  Mood:  Euthymic  Affect:  Congruent  Thought Process:  Coherent  Orientation:  Full (Time, Place, and Person)  Thought Content:  Logical  Suicidal Thoughts:  No  Homicidal Thoughts:  No  Memory:  Immediate;   Good Recent;   Good Remote;   Good  Judgement:  Good  Insight:  Good  Psychomotor Activity:  Normal  Concentration:  Concentration: Good and Attention Span: Good  Recall:  Good  Fund of Knowledge:  Good  Language:  Good  Akathisia:  No  Handed:  Right  AIMS (if indicated):     Assets:  Communication Skills Desire for Improvement Resilience  ADL's:  Intact  Cognition:  WNL  Sleep:  Number of Hours: 7.15      COGNITIVE FEATURES THAT CONTRIBUTE TO RISK:  None    SUICIDE RISK:   Minimal: No identifiable suicidal ideation.  Patients presenting with no risk factors but with morbid ruminations; may be classified as minimal risk based on the severity of the depressive symptoms  PLAN OF CARE: per discharge summary and case management confirming with him discharge appointments, safety plan reviewed.  I certify that inpatient services furnished can reasonably be expected to improve the patient's condition.   Terance Hart, MD 10/28/2018, 12:39 PM

## 2018-11-04 ENCOUNTER — Emergency Department: Payer: Medicare Other

## 2018-11-04 ENCOUNTER — Inpatient Hospital Stay
Admission: EM | Admit: 2018-11-04 | Discharge: 2018-11-05 | DRG: 433 | Disposition: A | Discharge status: Home / Self Care | Payer: Medicare Other | Attending: Internal Medicine | Admitting: Internal Medicine

## 2018-11-04 ENCOUNTER — Encounter: Payer: Self-pay | Admitting: Emergency Medicine

## 2018-11-04 ENCOUNTER — Other Ambulatory Visit: Payer: Self-pay

## 2018-11-04 DIAGNOSIS — F10920 Alcohol use, unspecified with intoxication, uncomplicated: Secondary | ICD-10-CM

## 2018-11-04 DIAGNOSIS — Z9049 Acquired absence of other specified parts of digestive tract: Secondary | ICD-10-CM | POA: Diagnosis not present

## 2018-11-04 DIAGNOSIS — E876 Hypokalemia: Secondary | ICD-10-CM | POA: Diagnosis present

## 2018-11-04 DIAGNOSIS — F17210 Nicotine dependence, cigarettes, uncomplicated: Secondary | ICD-10-CM | POA: Diagnosis not present

## 2018-11-04 DIAGNOSIS — Z818 Family history of other mental and behavioral disorders: Secondary | ICD-10-CM

## 2018-11-04 DIAGNOSIS — I10 Essential (primary) hypertension: Secondary | ICD-10-CM | POA: Diagnosis not present

## 2018-11-04 DIAGNOSIS — Z888 Allergy status to other drugs, medicaments and biological substances status: Secondary | ICD-10-CM | POA: Diagnosis not present

## 2018-11-04 DIAGNOSIS — K7030 Alcoholic cirrhosis of liver without ascites: Secondary | ICD-10-CM | POA: Diagnosis not present

## 2018-11-04 DIAGNOSIS — F10120 Alcohol abuse with intoxication, uncomplicated: Secondary | ICD-10-CM | POA: Diagnosis present

## 2018-11-04 DIAGNOSIS — E119 Type 2 diabetes mellitus without complications: Secondary | ICD-10-CM | POA: Diagnosis not present

## 2018-11-04 DIAGNOSIS — Z881 Allergy status to other antibiotic agents status: Secondary | ICD-10-CM | POA: Diagnosis not present

## 2018-11-04 DIAGNOSIS — Z79899 Other long term (current) drug therapy: Secondary | ICD-10-CM

## 2018-11-04 DIAGNOSIS — F419 Anxiety disorder, unspecified: Secondary | ICD-10-CM | POA: Diagnosis not present

## 2018-11-04 DIAGNOSIS — R748 Abnormal levels of other serum enzymes: Secondary | ICD-10-CM | POA: Diagnosis present

## 2018-11-04 DIAGNOSIS — R7401 Elevation of levels of liver transaminase levels: Secondary | ICD-10-CM

## 2018-11-04 DIAGNOSIS — E785 Hyperlipidemia, unspecified: Secondary | ICD-10-CM | POA: Diagnosis present

## 2018-11-04 DIAGNOSIS — E871 Hypo-osmolality and hyponatremia: Secondary | ICD-10-CM | POA: Diagnosis present

## 2018-11-04 DIAGNOSIS — Z7984 Long term (current) use of oral hypoglycemic drugs: Secondary | ICD-10-CM

## 2018-11-04 DIAGNOSIS — R740 Nonspecific elevation of levels of transaminase and lactic acid dehydrogenase [LDH]: Secondary | ICD-10-CM

## 2018-11-04 DIAGNOSIS — K219 Gastro-esophageal reflux disease without esophagitis: Secondary | ICD-10-CM | POA: Diagnosis not present

## 2018-11-04 DIAGNOSIS — F329 Major depressive disorder, single episode, unspecified: Secondary | ICD-10-CM | POA: Diagnosis present

## 2018-11-04 LAB — COMPREHENSIVE METABOLIC PANEL
ALT: 1632 U/L — ABNORMAL HIGH (ref 0–44)
AST: 1164 U/L — ABNORMAL HIGH (ref 15–41)
Albumin: 3.8 g/dL (ref 3.5–5.0)
Alkaline Phosphatase: 96 U/L (ref 38–126)
Anion gap: 18 — ABNORMAL HIGH (ref 5–15)
BUN: 11 mg/dL (ref 8–23)
CALCIUM: 8.8 mg/dL — AB (ref 8.9–10.3)
CO2: 28 mmol/L (ref 22–32)
Chloride: 87 mmol/L — ABNORMAL LOW (ref 98–111)
Creatinine, Ser: 0.83 mg/dL (ref 0.61–1.24)
GFR calc Af Amer: >60 mL/min (ref >60)
GFR calc non Af Amer: >60 mL/min (ref >60)
GLUCOSE: 176 mg/dL — AB (ref 70–99)
Potassium: 3 mmol/L — ABNORMAL LOW (ref 3.5–5.1)
Sodium: 133 mmol/L — ABNORMAL LOW (ref 135–145)
Total Bilirubin: 4.3 mg/dL — ABNORMAL HIGH (ref 0.3–1.2)
Total Protein: 7.8 g/dL (ref 6.5–8.1)

## 2018-11-04 LAB — URINE DRUG SCREEN, QUALITATIVE (ARMC ONLY)
AMPHETAMINES, UR SCREEN: NOT DETECTED
Barbiturates, Ur Screen: NOT DETECTED
Benzodiazepine, Ur Scrn: POSITIVE — AB
CANNABINOID 50 NG, UR ~~LOC~~: NOT DETECTED
Cocaine Metabolite,Ur ~~LOC~~: NOT DETECTED
MDMA (ECSTASY) UR SCREEN: NOT DETECTED
Methadone Scn, Ur: NOT DETECTED
Opiate, Ur Screen: NOT DETECTED
Phencyclidine (PCP) Ur S: NOT DETECTED
Tricyclic, Ur Screen: NOT DETECTED

## 2018-11-04 LAB — CBC WITH DIFFERENTIAL/PLATELET
Abs Immature Granulocytes: 0.03 10*3/uL (ref 0.00–0.07)
BASOS ABS: 0 10*3/uL (ref 0.0–0.1)
Basophils Relative: 0 %
Eosinophils Absolute: 0.1 10*3/uL (ref 0.0–0.5)
Eosinophils Relative: 1 %
HCT: 43.5 % (ref 39.0–52.0)
Hemoglobin: 15.8 g/dL (ref 13.0–17.0)
Immature Granulocytes: 0 %
Lymphocytes Relative: 21 %
Lymphs Abs: 2.5 10*3/uL (ref 0.7–4.0)
MCH: 33.1 pg (ref 26.0–34.0)
MCHC: 36.3 g/dL — AB (ref 30.0–36.0)
MCV: 91 fL (ref 80.0–100.0)
Monocytes Absolute: 0.8 10*3/uL (ref 0.1–1.0)
Monocytes Relative: 7 %
NRBC: 0 % (ref 0.0–0.2)
Neutro Abs: 8.8 10*3/uL — ABNORMAL HIGH (ref 1.7–7.7)
Neutrophils Relative %: 71 %
Platelets: 186 10*3/uL (ref 150–400)
RBC: 4.78 MIL/uL (ref 4.22–5.81)
RDW: 13.5 % (ref 11.5–15.5)
WBC: 12.2 10*3/uL — ABNORMAL HIGH (ref 4.0–10.5)

## 2018-11-04 LAB — CREATININE, SERUM
CREATININE: 0.85 mg/dL (ref 0.61–1.24)
GFR calc Af Amer: >60 mL/min (ref >60)
GFR calc non Af Amer: >60 mL/min (ref >60)

## 2018-11-04 LAB — ACETAMINOPHEN LEVEL
Acetaminophen (Tylenol), Serum: <10 ug/mL — ABNORMAL LOW (ref 10–30)
Acetaminophen (Tylenol), Serum: <10 ug/mL — ABNORMAL LOW (ref 10–30)

## 2018-11-04 LAB — RAPID HIV SCREEN (HIV 1/2 AB+AG)
HIV 1/2 ANTIBODIES: NONREACTIVE
HIV-1 P24 Antigen - HIV24: NONREACTIVE

## 2018-11-04 LAB — CBC
HCT: 41.5 % (ref 39.0–52.0)
HEMOGLOBIN: 14.9 g/dL (ref 13.0–17.0)
MCH: 32.7 pg (ref 26.0–34.0)
MCHC: 35.9 g/dL (ref 30.0–36.0)
MCV: 91.2 fL (ref 80.0–100.0)
Platelets: 190 10*3/uL (ref 150–400)
RBC: 4.55 MIL/uL (ref 4.22–5.81)
RDW: 13.6 % (ref 11.5–15.5)
WBC: 12.6 10*3/uL — ABNORMAL HIGH (ref 4.0–10.5)
nRBC: 0 % (ref 0.0–0.2)

## 2018-11-04 LAB — SALICYLATE LEVEL: Salicylate Lvl: <7 mg/dL (ref 2.8–30.0)

## 2018-11-04 LAB — GLUCOSE, CAPILLARY
Glucose-Capillary: 163 mg/dL — ABNORMAL HIGH (ref 70–99)
Glucose-Capillary: 151 mg/dL — ABNORMAL HIGH (ref 70–99)
Glucose-Capillary: 166 mg/dL — ABNORMAL HIGH (ref 70–99)

## 2018-11-04 LAB — LIPASE, BLOOD: LIPASE: 32 U/L (ref 11–51)

## 2018-11-04 LAB — ETHANOL: Alcohol, Ethyl (B): 135 mg/dL — ABNORMAL HIGH (ref <10)

## 2018-11-04 MED ORDER — TRAZODONE HCL 50 MG PO TABS
50.0000 mg | ORAL_TABLET | Freq: Every day | ORAL | Status: DC
  Filled 2018-11-04: qty 1

## 2018-11-04 MED ORDER — ONDANSETRON HCL 4 MG PO TABS: 4.0000 mg | ORAL_TABLET | Freq: Four times a day (QID) | ORAL | Status: DC | PRN

## 2018-11-04 MED ORDER — POTASSIUM CHLORIDE 10 MEQ/100ML IV SOLN
10.0000 meq | Freq: Once | INTRAVENOUS | Status: AC
  Administered 2018-11-04: 10 meq via INTRAVENOUS
  Filled 2018-11-04: qty 100

## 2018-11-04 MED ORDER — FOLIC ACID 1 MG PO TABS
1.0000 mg | ORAL_TABLET | Freq: Every day | ORAL | Status: DC
  Administered 2018-11-04 – 2018-11-05 (×2): 1 mg via ORAL
  Filled 2018-11-04 (×2): qty 1

## 2018-11-04 MED ORDER — ONDANSETRON HCL 4 MG/2ML IJ SOLN: 4.0000 mg | Freq: Four times a day (QID) | INTRAMUSCULAR | Status: DC | PRN

## 2018-11-04 MED ORDER — SODIUM CHLORIDE 0.9 % IV BOLUS
1000.0000 mL | Freq: Once | INTRAVENOUS | Status: AC
  Administered 2018-11-04: 1000 mL via INTRAVENOUS

## 2018-11-04 MED ORDER — ENOXAPARIN SODIUM 40 MG/0.4ML ~~LOC~~ SOLN
40.0000 mg | SUBCUTANEOUS | Status: DC
  Filled 2018-11-04 (×2): qty 0.4

## 2018-11-04 MED ORDER — POTASSIUM CHLORIDE IN NACL 20-0.9 MEQ/L-% IV SOLN
INTRAVENOUS | Status: DC
  Administered 2018-11-04 – 2018-11-05 (×2): via INTRAVENOUS
  Filled 2018-11-04 (×4): qty 1000

## 2018-11-04 MED ORDER — THIAMINE HCL 100 MG/ML IJ SOLN
100.0000 mg | Freq: Every day | INTRAMUSCULAR | Status: DC
  Administered 2018-11-04 – 2018-11-05 (×2): 100 mg via INTRAVENOUS
  Filled 2018-11-04 (×2): qty 2

## 2018-11-04 MED ORDER — THIAMINE HCL 100 MG/ML IJ SOLN: 100.0000 mg | Freq: Every day | INTRAMUSCULAR | Status: DC

## 2018-11-04 MED ORDER — ADULT MULTIVITAMIN W/MINERALS CH
1.0000 | ORAL_TABLET | Freq: Every day | ORAL | Status: DC
  Administered 2018-11-04 – 2018-11-05 (×2): 1 via ORAL
  Filled 2018-11-04 (×2): qty 1

## 2018-11-04 MED ORDER — CHLORDIAZEPOXIDE HCL 5 MG PO CAPS
10.0000 mg | ORAL_CAPSULE | Freq: Three times a day (TID) | ORAL | Status: DC
  Administered 2018-11-04 – 2018-11-05 (×2): 10 mg via ORAL
  Filled 2018-11-04 (×2): qty 2

## 2018-11-04 MED ORDER — THIAMINE HCL 100 MG PO TABS: 100.0000 mg | ORAL_TABLET | Freq: Every day | ORAL | Status: DC

## 2018-11-04 MED ORDER — BISACODYL 5 MG PO TBEC: 5.0000 mg | DELAYED_RELEASE_TABLET | Freq: Every day | ORAL | Status: DC | PRN

## 2018-11-04 MED ORDER — LORAZEPAM 2 MG/ML IJ SOLN
1.0000 mg | Freq: Four times a day (QID) | INTRAMUSCULAR | Status: DC | PRN
  Administered 2018-11-04: 11:00:00 1 mg via INTRAVENOUS
  Filled 2018-11-04: qty 1

## 2018-11-04 MED ORDER — LORAZEPAM 1 MG PO TABS: 1.0000 mg | ORAL_TABLET | Freq: Four times a day (QID) | ORAL | Status: DC | PRN

## 2018-11-04 MED ORDER — LORAZEPAM 2 MG/ML IJ SOLN
0.0000 mg | Freq: Four times a day (QID) | INTRAMUSCULAR | Status: DC
  Administered 2018-11-04 (×2): 2 mg via INTRAVENOUS
  Administered 2018-11-04: 1 mg via INTRAVENOUS
  Administered 2018-11-05 (×2): 2 mg via INTRAVENOUS
  Filled 2018-11-04 (×5): qty 1

## 2018-11-04 MED ORDER — OXYCODONE HCL 5 MG PO TABS: 5.0000 mg | ORAL_TABLET | ORAL | Status: DC | PRN

## 2018-11-04 MED ORDER — ONDANSETRON HCL 4 MG/2ML IJ SOLN
4.0000 mg | Freq: Once | INTRAMUSCULAR | Status: AC
  Administered 2018-11-04: 4 mg via INTRAVENOUS
  Filled 2018-11-04: qty 2

## 2018-11-04 MED ORDER — THIAMINE HCL 100 MG/ML IJ SOLN
Freq: Once | INTRAVENOUS | Status: AC
  Administered 2018-11-04: 08:00:00 via INTRAVENOUS
  Filled 2018-11-04: qty 1000

## 2018-11-04 MED ORDER — NICOTINE 21 MG/24HR TD PT24
21.0000 mg | MEDICATED_PATCH | Freq: Every day | TRANSDERMAL | Status: DC
  Administered 2018-11-04 – 2018-11-05 (×2): 21 mg via TRANSDERMAL
  Filled 2018-11-04 (×2): qty 1

## 2018-11-04 MED ORDER — INSULIN ASPART 100 UNIT/ML ~~LOC~~ SOLN
0.0000 [IU] | Freq: Three times a day (TID) | SUBCUTANEOUS | Status: DC
Start: 2018-11-04 — End: 2018-11-05
  Administered 2018-11-04 (×2): 3 [IU] via SUBCUTANEOUS
  Administered 2018-11-05: 2 [IU] via SUBCUTANEOUS
  Administered 2018-11-05: 12:00:00 3 [IU] via SUBCUTANEOUS
  Filled 2018-11-04 (×4): qty 1

## 2018-11-04 MED ORDER — SENNOSIDES-DOCUSATE SODIUM 8.6-50 MG PO TABS: 1.0000 | ORAL_TABLET | Freq: Every evening | ORAL | Status: DC | PRN

## 2018-11-04 MED ORDER — POTASSIUM CHLORIDE CRYS ER 20 MEQ PO TBCR
20.0000 meq | EXTENDED_RELEASE_TABLET | Freq: Once | ORAL | Status: AC
  Administered 2018-11-04: 20 meq via ORAL
  Filled 2018-11-04: qty 1

## 2018-11-04 MED ORDER — LORAZEPAM 2 MG PO TABS: 0.0000 mg | ORAL_TABLET | Freq: Two times a day (BID) | ORAL | Status: DC

## 2018-11-04 MED ORDER — VITAMIN B-1 100 MG PO TABS: 100.0000 mg | ORAL_TABLET | Freq: Every day | ORAL | Status: DC

## 2018-11-04 MED ORDER — SERTRALINE HCL 50 MG PO TABS
100.0000 mg | ORAL_TABLET | Freq: Two times a day (BID) | ORAL | Status: DC
  Administered 2018-11-04 – 2018-11-05 (×2): 100 mg via ORAL
  Filled 2018-11-04 (×3): qty 2

## 2018-11-04 MED ORDER — LORAZEPAM 2 MG PO TABS
0.0000 mg | ORAL_TABLET | Freq: Four times a day (QID) | ORAL | Status: DC
  Administered 2018-11-04: 21:00:00 2 mg via ORAL
  Filled 2018-11-04: qty 2

## 2018-11-04 NOTE — H&P (Signed)
[facility]   PATIENT NAME: Cory PontesRandall Foster    MR#:  [id number]  DATE OF BIRTH:  12-08-1955  DATE OF ADMISSION:  11/04/2018  PRIMARY CARE PHYSICIAN: [place] [facility]   REQUESTING/REFERRING PHYSICIAN: dr sung  CHIEF COMPLAINT:   intoxication HISTORY OF PRESENT ILLNESS:  Cory Foster  is a 63 y.o. male with a known history of EtOH and tobacco dependence who presents to the emergency room via EMS requesting detox. Patient reports drinking 5-6 beers a day.  He has gone through alcohol withdrawal in the past.  In the emergency room he is noted to have elevated liver function tests and ultrasound showing liver cirrhosis.  PAST MEDICAL HISTORY:   Past Medical History:  Diagnosis Date  . Anxiety   . DDD (degenerative disc disease)   . Depression   . GERD (gastroesophageal reflux disease)   . Hyperlipidemia   . Hypertension   . Psoriasis   . Substance abuse (HCC)   . Substance induced mood disorder (HCC)   . Varicose vein     PAST SURGICAL HISTORY:   Past Surgical History:  Procedure Laterality Date  . CHOLECYSTECTOMY    . SINUSOTOMY    . VEIN LIGATION AND STRIPPING      SOCIAL HISTORY:   Social History   Tobacco Use  . Smoking status: Current Every Day Smoker    Packs/day: 1.00    Types: Cigarettes  . Smokeless tobacco: Never Used  Substance Use Topics  . Alcohol use: Yes    Alcohol/week: 1.0 standard drinks    Types: 1 Cans of beer per week    Comment: bi-weekly    FAMILY HISTORY:   Family History  Problem Relation Age of Onset  . Depression Mother   . Anxiety disorder Mother   . Cancer Father     DRUG ALLERGIES:   Allergies  Allergen Reactions  . Ceclor [Cefaclor] Other (See Comments)    Reaction: unknown  . Ciprofloxacin Other (See Comments)    Reaction: unknown  . Citalopram Other (See Comments)    Reaction: Hyper   . Seroquel [Quetiapine Fumarate] Other (See Comments)    Reaction: "hung  over" feeling    REVIEW OF SYSTEMS:   Review of Systems  Constitutional: Negative.  Negative for chills, fever and malaise/fatigue.  HENT: Negative.  Negative for ear discharge, ear pain, hearing loss, nosebleeds and sore throat.   Eyes: Negative.  Negative for blurred vision and pain.  Respiratory: Negative.  Negative for cough, hemoptysis, shortness of breath and wheezing.   Cardiovascular: Negative.  Negative for chest pain, palpitations and leg swelling.  Gastrointestinal: Negative.  Negative for abdominal pain, blood in stool, diarrhea, nausea and vomiting.  Genitourinary: Negative.  Negative for dysuria.  Musculoskeletal: Negative.  Negative for back pain.  Skin: Negative.   Neurological: Positive for tremors. Negative for dizziness, speech change, focal weakness, seizures and headaches.  Endo/Heme/Allergies: Negative.  Does not bruise/bleed easily.  Psychiatric/Behavioral: Positive for substance abuse. Negative for depression, hallucinations and suicidal ideas.    MEDICATIONS AT HOME:   Prior to Admission medications   Medication Sig Start Date End Date Taking? Authorizing Provider  atorvastatin (LIPITOR) 80 MG tablet Take 80 mg by mouth daily.    Yes [provider]  metFORMIN (GLUCOPHAGE) 1000 MG tablet Take 1 tablet (1,000 mg total) by mouth 2 (two) times daily with a meal for 30 days. 10/28/18 11/27/18 Yes Terance HartStephens, Wayland C, MD  midodrine (PROAMATINE) 5  MG tablet Take 1 tablet (5 mg total) by mouth 3 (three) times daily with meals for 30 days. 10/28/18 11/27/18 Yes Terance Hart, MD  nicotine (NICODERM CQ - DOSED IN MG/24 HOURS) 21 mg/24hr patch Place 1 patch (21 mg total) onto the skin daily. 10/29/18  Yes Terance Hart, MD  sertraline (ZOLOFT) 100 MG tablet Take 1 tablet (100 mg total) by mouth 2 (two) times daily for 30 days. 10/28/18 11/27/18 Yes Terance Hart, MD  thiamine 100 MG tablet Take 1 tablet (100 mg total) by mouth daily for 30 days. 10/28/18 11/27/18  Yes Terance Hart, MD  traZODone (DESYREL) 50 MG tablet Take 1 tablet (50 mg total) by mouth at bedtime for 30 days. 10/28/18 11/27/18 Yes Terance Hart, MD      VITAL SIGNS:  Blood pressure 139/86, pulse 91, temperature 98.7 F (37.1 C), temperature source Oral, resp. rate 18, height 6\' 2"  (1.88 m), weight 102.1 kg, SpO2 97 %.  PHYSICAL EXAMINATION:   Physical Exam Constitutional:      General: He is not in acute distress. HENT:     Head: Normocephalic.  Eyes:     General: No scleral icterus. Neck:     Musculoskeletal: Normal range of motion and neck supple.     Vascular: No JVD.     Trachea: No tracheal deviation.  Cardiovascular:     Rate and Rhythm: Normal rate and regular rhythm.     Heart sounds: Normal heart sounds. No murmur. No friction rub. No gallop.   Pulmonary:     Effort: Pulmonary effort is normal. No respiratory distress.     Breath sounds: Normal breath sounds. No wheezing or rales.  Chest:     Chest wall: No tenderness.  Abdominal:     General: Bowel sounds are normal. There is no distension.     Palpations: Abdomen is soft. There is no mass.     Tenderness: There is no abdominal tenderness. There is no guarding or rebound.  Musculoskeletal: Normal range of motion.  Skin:    General: Skin is warm.     Findings: No erythema or rash.  Neurological:     Mental Status: He is alert and oriented to person, place, and time.     Comments: Mild tremors  Psychiatric:        Judgment: Judgment normal.       LABORATORY PANEL:   CBC Recent Labs  Lab 11/04/18 1011  WBC 12.6*  HGB 14.9  HCT 41.5  PLT 190   ------------------------------------------------------------------------------------------------------------------  Chemistries  Recent Labs  Lab 11/04/18 0423 11/04/18 1011  NA 133*  --   K 3.0*  --   CL 87*  --   CO2 28  --   GLUCOSE 176*  --   BUN 11  --   CREATININE 0.83 0.85  CALCIUM 8.8*  --   AST 1,164*  --   ALT 1,632*   --   ALKPHOS 96  --   BILITOT 4.3*  --    ------------------------------------------------------------------------------------------------------------------  Cardiac Enzymes No results for input(s): TROPONINI in the last 168 hours. ------------------------------------------------------------------------------------------------------------------  RADIOLOGY:  US Abdomen Limited Ruq  Result Date: 11/04/2018 CLINICAL DATA:  Initial evaluation for elevated LFTs, alcohol intoxication. EXAM: ULTRASOUND ABDOMEN LIMITED RIGHT UPPER QUADRANT COMPARISON:  Prior radiograph from 02/25/2018. FINDINGS: Gallbladder: Surgically absent. Common bile duct: Diameter: 3.6 mm Liver: No focal lesion identified. Liver demonstrates a coarse heterogeneous and increased echotexture with nodular contour, suggesting cirrhosis. Portal  vein is patent on color Doppler imaging with normal direction of blood flow towards the liver. IMPRESSION: 1. Findings compatible with hepatic cirrhosis. No focal intrahepatic lesions identified by sonography. 2. Prior cholecystectomy.  No biliary dilatation. Electronically Signed   By: Rise Mu M.D.   On: 11/04/2018 06:22    EKG:   none IMPRESSION AND PLAN:   63 year old male with a history of EtOH and tobacco dependence who presents to the emergency room requesting detox of alcohol.  1.  Elevated liver function test: Abdominal ultrasound suggest liver cirrhosis. GI consultation placed via epic Follow LFTs Check Tylenol and hepatitis panel along with HIV Further management as per GI consult.  2.  EtOH abuse: CIWA protocol initiated  3.  Hypokalemia: This will be repleted and rechecked in a.m. Check magnesium level  4. Tobacco dependence: Patient is encouraged to quit smoking. Counseling was provided for 4 minutes.  5.  Mild hyponatremia from EtOH abuse: IV fluids and repeat BMP in a.m.  6.  Diabetes: Sliding scale  7.  Hyperlipidemia: Due to elevated LFTs we will  discontinue atorvastatin for now All the records are reviewed and case discussed with ED provider. Management plans discussed with the patient and he is in agreement  CODE STATUS: full  TOTAL TIME TAKING CARE OF THIS PATIENT: 50 minutes.    Quanda Pavlicek M.D on 11/04/2018 at 11:45 AM  Between 7am to 6pm - Pager - [phone]  After 6pm go to [email or web address] - Social research officer, government  [facility]  Office  [phone]  CC: Primary care physician; [facility]

## 2018-11-04 NOTE — ED Provider Notes (Signed)
[facility] Emergency Department Provider Note   ____________________________________________   First MD Initiated Contact with Patient 11/04/18 [phone]     (approximate)  I have reviewed the triage vital signs and the nursing notes.   HISTORY  Chief Complaint Drug / Alcohol Assessment    HPI Ileene RubensRandall McDougal Maude LericheCameron Jr. is a 63 y.o. male brought to the ED via EMS from local motel with a chief complaint of alcohol intoxication requesting detox.  Patient reports drinking 5-6 beers daily.  He was seen for a psychiatric issue approximately 1 week ago here.  States he began drinking again on discharge.  Denies active SI/HI/AH/VH.  Complaining of nausea and "shakes".  Denies recent fever, chills, chest pain, shortness of breath, vomiting, dysuria, diarrhea.  Denies recent travel or trauma.    Past Medical History:  Diagnosis Date  . Anxiety   . DDD (degenerative disc disease)   . Depression   . GERD (gastroesophageal reflux disease)   . Hyperlipidemia   . Hypertension   . Psoriasis   . Substance abuse (HCC)   . Substance induced mood disorder (HCC)   . Varicose vein     Patient Active Problem List   Diagnosis Date Noted  . Substance induced mood disorder (HCC) 08/02/2018  . Acute respiratory failure with hypercapnia (HCC) 01/01/2018  . Severe recurrent major depression without psychotic features (HCC) 12/14/2017  . Diabetes (HCC) 12/14/2017  . Hypertension   . Hyperlipidemia   . GERD (gastroesophageal reflux disease)   . Depression   . Substance abuse (HCC)   . Anxiety   . DDD (degenerative disc disease)   . Psoriasis   . Alcohol dependence (HCC) 08/08/2013  . MDD (major depressive disorder) 08/08/2013  . GAD (generalized anxiety disorder) 08/08/2013    Past Surgical History:  Procedure Laterality Date  . CHOLECYSTECTOMY    . SINUSOTOMY    . VEIN LIGATION AND STRIPPING      Prior to Admission medications   Medication Sig Start Date End  Date Taking? Authorizing Provider  atorvastatin (LIPITOR) 80 MG tablet Take 80 mg by mouth daily.    Yes [provider]  metFORMIN (GLUCOPHAGE) 1000 MG tablet Take 1 tablet (1,000 mg total) by mouth 2 (two) times daily with a meal for 30 days. 10/28/18 11/27/18 Yes Terance HartStephens, Wayland C, MD  midodrine (PROAMATINE) 5 MG tablet Take 1 tablet (5 mg total) by mouth 3 (three) times daily with meals for 30 days. 10/28/18 11/27/18 Yes Terance HartStephens, Wayland C, MD  nicotine (NICODERM CQ - DOSED IN MG/24 HOURS) 21 mg/24hr patch Place 1 patch (21 mg total) onto the skin daily. 10/29/18  Yes Terance HartStephens, Wayland C, MD  sertraline (ZOLOFT) 100 MG tablet Take 1 tablet (100 mg total) by mouth 2 (two) times daily for 30 days. 10/28/18 11/27/18 Yes Terance HartStephens, Wayland C, MD  thiamine 100 MG tablet Take 1 tablet (100 mg total) by mouth daily for 30 days. 10/28/18 11/27/18 Yes Terance HartStephens, Wayland C, MD  traZODone (DESYREL) 50 MG tablet Take 1 tablet (50 mg total) by mouth at bedtime for 30 days. 10/28/18 11/27/18 Yes Terance HartStephens, Wayland C, MD    Allergies Ceclor [cefaclor]; Ciprofloxacin; Citalopram; and Seroquel [quetiapine fumarate]  Family History  Problem Relation Age of Onset  . Depression Mother   . Anxiety disorder Mother   . Cancer Father     Social History Social History   Tobacco Use  . Smoking status: Current Every Day Smoker    Packs/day: 1.00  Types: Cigarettes  . Smokeless tobacco: Never Used  Substance Use Topics  . Alcohol use: Yes    Alcohol/week: 1.0 standard drinks    Types: 1 Cans of beer per week    Comment: bi-weekly  . Drug use: No    Review of Systems  Constitutional: No fever/chills Eyes: No visual changes. ENT: No sore throat. Cardiovascular: Denies chest pain. Respiratory: Denies shortness of breath. Gastrointestinal: No abdominal pain.  No nausea, no vomiting.  No diarrhea.  No constipation. Genitourinary: Negative for dysuria. Musculoskeletal: Negative for back pain. Skin: Negative for  rash. Neurological: Negative for headaches, focal weakness or numbness. Psychiatric:  Positive for alcohol intoxication requesting detox.  ____________________________________________   PHYSICAL EXAM:  VITAL SIGNS: ED Triage Vitals  Enc Vitals Group     BP      Pulse      Resp      Temp      Temp src      SpO2      Weight      Height      Head Circumference      Peak Flow      Pain Score      Pain Loc      Pain Edu?      Excl. in GC?     Constitutional: Alert and oriented.  Disheveled appearing and in no acute distress. Eyes: Conjunctivae are normal. PERRL. EOMI. Head: Atraumatic. Nose: No congestion/rhinnorhea. Mouth/Throat: Mucous membranes are moist.  Oropharynx non-erythematous. Neck: No stridor.   Cardiovascular: Normal rate, regular rhythm. Grossly normal heart sounds.  Good peripheral circulation. Respiratory: Normal respiratory effort.  No retractions. Lungs CTAB. Gastrointestinal: Soft and mildly tender to palpation epigastrium without rebound or guarding. No distention. No abdominal bruits. No CVA tenderness. Musculoskeletal: No lower extremity tenderness nor edema.  No joint effusions. Neurologic:  Normal speech and language. No gross focal neurologic deficits are appreciated.  Skin:  Skin is warm, dry and intact. No rash noted. Psychiatric: Mood and affect are intoxicated. Speech and behavior are normal.  ____________________________________________   LABS (all labs ordered are listed, but only abnormal results are displayed)  Labs Reviewed  CBC WITH DIFFERENTIAL/PLATELET - Abnormal; Notable for the following components:      Result Value   WBC 12.2 (*)    MCHC 36.3 (*)    Neutro Abs 8.8 (*)    All other components within normal limits  COMPREHENSIVE METABOLIC PANEL - Abnormal; Notable for the following components:   Sodium 133 (*)    Potassium 3.0 (*)    Chloride 87 (*)    Glucose, Bld 176 (*)    Calcium 8.8 (*)    AST 1,164 (*)    ALT 1,632  (*)    Total Bilirubin 4.3 (*)    Anion gap 18 (*)    All other components within normal limits  ETHANOL - Abnormal; Notable for the following components:   Alcohol, Ethyl (B) 135 (*)    All other components within normal limits  ACETAMINOPHEN LEVEL - Abnormal; Notable for the following components:   Acetaminophen (Tylenol), Serum <10 (*)    All other components within normal limits  SALICYLATE LEVEL  LIPASE, BLOOD  URINE DRUG SCREEN, QUALITATIVE ([facility] ONLY)   ____________________________________________  EKG  None ____________________________________________  RADIOLOGY  ED MD interpretation: Ultrasound compatible with hepatic cirrhosis  Official radiology report(s): US Abdomen Limited Ruq  Result Date: 11/04/2018 CLINICAL DATA:  Initial evaluation for elevated LFTs, alcohol intoxication. EXAM: ULTRASOUND  ABDOMEN LIMITED RIGHT UPPER QUADRANT COMPARISON:  Prior radiograph from 02/25/2018. FINDINGS: Gallbladder: Surgically absent. Common bile duct: Diameter: 3.6 mm Liver: No focal lesion identified. Liver demonstrates a coarse heterogeneous and increased echotexture with nodular contour, suggesting cirrhosis. Portal vein is patent on color Doppler imaging with normal direction of blood flow towards the liver. IMPRESSION: 1. Findings compatible with hepatic cirrhosis. No focal intrahepatic lesions identified by sonography. 2. Prior cholecystectomy.  No biliary dilatation. Electronically Signed   By: Rise Mu M.D.   On: 11/04/2018 06:22    ____________________________________________   PROCEDURES  Procedure(s) performed: None  Procedures  Critical Care performed:   CRITICAL CARE Performed by: Irean Hong   Total critical care time: 30 minutes  Critical care time was exclusive of separately billable procedures and treating other patients.  Critical care was necessary to treat or prevent imminent or life-threatening deterioration.  Critical care was time spent  personally by me on the following activities: development of treatment plan with patient and/or surrogate as well as nursing, discussions with consultants, evaluation of patient's response to treatment, examination of patient, obtaining history from patient or surrogate, ordering and performing treatments and interventions, ordering and review of laboratory studies, ordering and review of radiographic studies, pulse oximetry and re-evaluation of patient's condition.  ____________________________________________   INITIAL IMPRESSION / ASSESSMENT AND PLAN / ED COURSE  As part of my medical decision making, I reviewed the following data within the electronic MEDICAL RECORD NUMBER Nursing notes reviewed and incorporated, Labs reviewed, Old chart reviewed and Notes from prior ED visits    63 year old alcoholic who presents to the ED requesting detox.  Denies active SI/HI.  No prior history of DTs.  Will obtain screening lab work, initiate IV fluid resuscitation, placed on CIWA scale.  TTS to see patient to see if we can get him into a detox facility.   Clinical Course as of Nov 05 639  Sat Nov 04, 2018  6213 Noted transaminases extremely elevated as well as total bilirubin.  Will obtain right upper quadrant abdominal ultrasound.  Check lipase.  IV potassium ordered for mild hypokalemia.   [JS]  0631 Ultrasound findings consistent with alcoholic cirrhosis.  Will discuss with hospitalist Dr. Sheryle Hail to evaluate patient in the emergency department for admission.   [JS]    Clinical Course User Index [JS] Irean Hong, MD     ____________________________________________   FINAL CLINICAL IMPRESSION(S) / ED DIAGNOSES  Final diagnoses:  Alcoholic intoxication without complication (HCC)  Hypokalemia  Elevated transaminase level  Alcoholic cirrhosis of liver without ascites [facility])     ED Discharge Orders    None       Note:  This document was prepared using Dragon voice recognition software  and may include unintentional dictation errors.    Irean Hong, MD 11/04/18 [phone]

## 2018-11-04 NOTE — ED Notes (Signed)
Pt returned from Ultrasound.

## 2018-11-04 NOTE — Progress Notes (Signed)
PHARMACY CONSULT NOTE - FOLLOW UP  Pharmacy Consult for Electrolyte Monitoring and Replacement   Recent Labs: Potassium (mmol/L)  Date Value  11/04/2018 3.0 (L)  10/17/2013 3.3 (L)   Magnesium (mg/dL)  Date Value  73/41/9379 1.9   Calcium (mg/dL)  Date Value  02/40/9735 8.8 (L)   Calcium, Total (mg/dL)  Date Value  32/99/2426 7.5 (L)   Albumin (g/dL)  Date Value  83/41/9622 3.8  10/17/2013 2.7 (L)   Phosphorus (mg/dL)  Date Value  29/79/8921 4.6   Sodium (mmol/L)  Date Value  11/04/2018 133 (L)  10/17/2013 140     Assessment: 63 year old male here for alcohol detox per patient  Goal of Therapy:  Potassium ~ 4 Magnesium ~ 2  Plan:  K 3: Potassium 10 mEq IV x 2 ordered. Will add additional 20 mEq PO x 1 for total of 40 mEq. Patient also receiving NS w/ K 20 mEq/L at 75 mL/hr.  CMP ordered for tomorrow morning per MD. Will add magnesium.  Pricilla Riffle, PharmD Pharmacy Resident  11/04/2018 12:01 PM

## 2018-11-04 NOTE — Plan of Care (Signed)
  Problem: Education: Goal: Knowledge of General Education information will improve Description: Including pain rating scale, medication(s)/side effects and non-pharmacologic comfort measures Outcome: Progressing   Problem: Education: Goal: Knowledge of disease or condition will improve Outcome: Progressing Goal: Understanding of discharge needs will improve Outcome: Progressing   Problem: Health Behavior/Discharge Planning: Goal: Ability to identify changes in lifestyle to reduce recurrence of condition will improve Outcome: Progressing Goal: Identification of resources available to assist in meeting health care needs will improve Outcome: Progressing   Problem: Physical Regulation: Goal: Complications related to the disease process, condition or treatment will be avoided or minimized Outcome: Progressing   Problem: Safety: Goal: Ability to remain free from injury will improve Outcome: Progressing   

## 2018-11-04 NOTE — Consult Note (Signed)
Vonda Antigua, MD [address] [place], [place], [place], [address] [address], [place], [place], [place], [address] Phone: [phone]  Fax: [phone]  Consultation  Referring Provider:     Dr. Benjie Karvonen Primary Care Physician:  [facility] Reason for Consultation:     Elevated liver enzymes  Date of Admission:  11/04/2018 Date of Consultation:  11/04/2018         HPI:   Cory Foster. is a 63 y.o. male with history of alcohol abuse presents to the ER for detox and admitted to the inpatient service due to elevated liver enzymes.  reported drinking 5-6 beers daily for years.  The patient denies abdominal or flank pain, anorexia, nausea or vomiting, dysphagia, change in bowel habits or black or bloody stools or weight loss.  No hematemesis.  No prior EGD or colonoscopy.  Lab work revealed elevated AST and ALT. Total bilirubin also elevated to 4.3 but alk phos normal.  No right upper quadrant pain.  No fever or chills.  Right upper quadrant ultrasound showed evidence of prior cholecystectomy and no evidence of bile duct obstruction.  No biliary dilatation.  Findings compatible with hepatic cirrhosis were reported.     Past Medical History:  Diagnosis Date  . Anxiety   . DDD (degenerative disc disease)   . Depression   . GERD (gastroesophageal reflux disease)   . Hyperlipidemia   . Hypertension   . Psoriasis   . Substance abuse ([place])   . Substance induced mood disorder ([place])   . Varicose vein     Past Surgical History:  Procedure Laterality Date  . CHOLECYSTECTOMY    . SINUSOTOMY    . VEIN LIGATION AND STRIPPING      Prior to Admission medications   Medication Sig Start Date End Date Taking? Authorizing Provider  atorvastatin (LIPITOR) 80 MG tablet Take 80 mg by mouth daily.    Yes [provider]  metFORMIN (GLUCOPHAGE) 1000 MG tablet Take 1 tablet (1,000 mg total) by mouth 2 (two) times daily with a meal for 30 days. 10/28/18  11/27/18 Yes Patience Musca, MD  midodrine (PROAMATINE) 5 MG tablet Take 1 tablet (5 mg total) by mouth 3 (three) times daily with meals for 30 days. 10/28/18 11/27/18 Yes Patience Musca, MD  nicotine (NICODERM CQ - DOSED IN MG/24 HOURS) 21 mg/24hr patch Place 1 patch (21 mg total) onto the skin daily. 10/29/18  Yes Patience Musca, MD  sertraline (ZOLOFT) 100 MG tablet Take 1 tablet (100 mg total) by mouth 2 (two) times daily for 30 days. 10/28/18 11/27/18 Yes Patience Musca, MD  thiamine 100 MG tablet Take 1 tablet (100 mg total) by mouth daily for 30 days. 10/28/18 11/27/18 Yes Patience Musca, MD  traZODone (DESYREL) 50 MG tablet Take 1 tablet (50 mg total) by mouth at bedtime for 30 days. 10/28/18 11/27/18 Yes Patience Musca, MD    Family History  Problem Relation Age of Onset  . Depression Mother   . Anxiety disorder Mother   . Cancer Father      Social History   Tobacco Use  . Smoking status: Current Every Day Smoker    Packs/day: 1.00    Types: Cigarettes  . Smokeless tobacco: Never Used  Substance Use Topics  . Alcohol use: Yes    Alcohol/week: 1.0 standard drinks    Types: 1 Cans of beer per week    Comment: bi-weekly  .  Drug use: No    Allergies as of 11/04/2018 - Review Complete 11/04/2018  Allergen Reaction Noted  . Ceclor [cefaclor] Other (See Comments) 08/27/2013  . Ciprofloxacin Other (See Comments) 08/27/2013  . Citalopram Other (See Comments) 08/27/2013  . Seroquel [quetiapine fumarate] Other (See Comments) 08/27/2013    Review of Systems:    All systems reviewed and negative except where noted in HPI.   Physical Exam:  Vital signs in last 24 hours: Vitals:   11/04/18 0419 11/04/18 0734 11/04/18 0930 11/04/18 0934  BP:  114/63 (!) 145/92 (!) 145/92  Pulse:  76 88 88  Resp:  20 18   Temp:  98.2 F (36.8 C)    TempSrc:  Oral    SpO2:  97% 97%   Weight: 102.1 kg     Height: '6\' 2"'  (1.88 m)        General:   Pleasant, cooperative in  NAD Head:  Normocephalic and atraumatic. Eyes:   No icterus.   Conjunctiva pink. PERRLA. Ears:  Normal auditory acuity. Neck:  Supple; no masses or thyroidomegaly Lungs: Respirations even and unlabored. Lungs clear to auscultation bilaterally.   No wheezes, crackles, or rhonchi.  Abdomen:  Soft, nondistended, nontender. Normal bowel sounds. No appreciable masses or hepatomegaly.  No rebound or guarding.  Neurologic:  Alert and oriented x3;  grossly normal neurologically. Skin:  Intact without significant lesions or rashes. Cervical Nodes:  No significant cervical adenopathy. Psych:  Alert and cooperative. Normal affect.  LAB RESULTS: Recent Labs    11/04/18 0423  WBC 12.2*  HGB 15.8  HCT 43.5  PLT 186   BMET Recent Labs    11/04/18 0423  NA 133*  K 3.0*  CL 87*  CO2 28  GLUCOSE 176*  BUN 11  CREATININE 0.83  CALCIUM 8.8*   LFT Recent Labs    11/04/18 0423  PROT 7.8  ALBUMIN 3.8  AST 1,164*  ALT 1,632*  ALKPHOS 96  BILITOT 4.3*   PT/INR No results for input(s): LABPROT, INR in the last 72 hours.  STUDIES: US Abdomen Limited Ruq  Result Date: 11/04/2018 CLINICAL DATA:  Initial evaluation for elevated LFTs, alcohol intoxication. EXAM: ULTRASOUND ABDOMEN LIMITED RIGHT UPPER QUADRANT COMPARISON:  Prior radiograph from 02/25/2018. FINDINGS: Gallbladder: Surgically absent. Common bile duct: Diameter: 3.6 mm Liver: No focal lesion identified. Liver demonstrates a coarse heterogeneous and increased echotexture with nodular contour, suggesting cirrhosis. Portal vein is patent on color Doppler imaging with normal direction of blood flow towards the liver. IMPRESSION: 1. Findings compatible with hepatic cirrhosis. No focal intrahepatic lesions identified by sonography. 2. Prior cholecystectomy.  No biliary dilatation. Electronically Signed   By: Jeannine Boga M.D.   On: 11/04/2018 06:22      Impression / Plan:   Cory Foster. is a 63 y.o. y/o male  with history of alcohol abuse presents for detox and found to have acutely elevated liver enzymes  Acute elevation liver enzymes likely due to alcohol abuse No evidence of biliary duct obstruction on ultrasound Elevation in total bilirubin, but normal alk phos is evidence to suggest that elevated liver enzymes are not due to biliary duct obstruction.  Acetaminophen level is low I have ordered viral hepatitis labs which are pending  Avoid hepatotoxic drugs Continue daily CMP Liver enzymes would be expected to increase, peak and decrease as he gets further out from his last alcohol drink.  If clinical picture changes and evidence suggests other etiology of elevated liver enzymes can  order autoimmune labs in 1 to 2 days if liver enzymes do not improve.  CIWA protocol Folate, thiamine  Patient will need outpatient follow-up for cirrhosis and work-up for cirrhosis including EGD for variceal screening, discussion for screening colonoscopy etc.  This is elective outpatient work-up that can be done after work-up for above acute issues as an inpatient.  No evidence of confusion, bleeding, ascites or decompensation at this time.  Patient educated about the ongoing risks of alcohol abuse including decompensation of cirrhosis, increased comorbidity and mortality.  Thank you for involving me in the care of this patient.      LOS: 0 days   Virgel Manifold, MD  11/04/2018, 9:35 AM

## 2018-11-04 NOTE — ED Notes (Signed)
Pt to ultrasound

## 2018-11-04 NOTE — ED Notes (Signed)
Pt given breakfast tray. Sitting up on stretcher eating at this time.

## 2018-11-04 NOTE — ED Triage Notes (Signed)
EMS pt to [facility] from local motel. Pt requesting detox from alcohol. Pt reports he was inpatient here for 10 days for the same and discharged about 1 week ago. Pt states he went back to drinking between 5 to 10 beers daily. EMS reports vital signs of pulse 100, BP 138/82 and O2 sat of 97%. FSBS 207. Pt denies SI or HI at this time.

## 2018-11-05 DIAGNOSIS — K7030 Alcoholic cirrhosis of liver without ascites: Secondary | ICD-10-CM | POA: Diagnosis not present

## 2018-11-05 DIAGNOSIS — E876 Hypokalemia: Secondary | ICD-10-CM | POA: Diagnosis not present

## 2018-11-05 LAB — CBC
HCT: 33.9 % — ABNORMAL LOW (ref 39.0–52.0)
Hemoglobin: 12 g/dL — ABNORMAL LOW (ref 13.0–17.0)
MCH: 33.5 pg (ref 26.0–34.0)
MCHC: 35.4 g/dL (ref 30.0–36.0)
MCV: 94.7 fL (ref 80.0–100.0)
Platelets: 80 10*3/uL — ABNORMAL LOW (ref 150–400)
RBC: 3.58 MIL/uL — ABNORMAL LOW (ref 4.22–5.81)
RDW: 13.7 % (ref 11.5–15.5)
WBC: 4.2 10*3/uL (ref 4.0–10.5)
nRBC: 0 % (ref 0.0–0.2)

## 2018-11-05 LAB — COMPREHENSIVE METABOLIC PANEL
ALT: 946 U/L — ABNORMAL HIGH (ref 0–44)
AST: 565 U/L — ABNORMAL HIGH (ref 15–41)
Albumin: 3.2 g/dL — ABNORMAL LOW (ref 3.5–5.0)
Alkaline Phosphatase: 125 U/L (ref 38–126)
Anion gap: 10 (ref 5–15)
BUN: 14 mg/dL (ref 8–23)
CO2: 27 mmol/L (ref 22–32)
Calcium: 8 mg/dL — ABNORMAL LOW (ref 8.9–10.3)
Chloride: 99 mmol/L (ref 98–111)
Creatinine, Ser: 1.05 mg/dL (ref 0.61–1.24)
GFR calc Af Amer: >60 mL/min (ref >60)
GFR calc non Af Amer: >60 mL/min (ref >60)
Glucose, Bld: 144 mg/dL — ABNORMAL HIGH (ref 70–99)
POTASSIUM: 3.3 mmol/L — AB (ref 3.5–5.1)
Sodium: 136 mmol/L (ref 135–145)
Total Bilirubin: 4.8 mg/dL — ABNORMAL HIGH (ref 0.3–1.2)
Total Protein: 6.5 g/dL (ref 6.5–8.1)

## 2018-11-05 LAB — MAGNESIUM: Magnesium: 1.2 mg/dL — ABNORMAL LOW (ref 1.7–2.4)

## 2018-11-05 LAB — GLUCOSE, CAPILLARY
Glucose-Capillary: 122 mg/dL — ABNORMAL HIGH (ref 70–99)
Glucose-Capillary: 159 mg/dL — ABNORMAL HIGH (ref 70–99)

## 2018-11-05 MED ORDER — POTASSIUM CHLORIDE CRYS ER 20 MEQ PO TBCR
20.0000 meq | EXTENDED_RELEASE_TABLET | ORAL | Status: AC
  Administered 2018-11-05 (×2): 20 meq via ORAL
  Filled 2018-11-05 (×2): qty 1

## 2018-11-05 MED ORDER — LORAZEPAM 2 MG/ML IJ SOLN
INTRAMUSCULAR | Status: AC
  Filled 2018-11-05: qty 1

## 2018-11-05 MED ORDER — HALOPERIDOL LACTATE 5 MG/ML IJ SOLN
INTRAMUSCULAR | Status: AC
  Filled 2018-11-05: qty 1

## 2018-11-05 MED ORDER — MAGNESIUM SULFATE 4 GM/100ML IV SOLN
4.0000 g | Freq: Once | INTRAVENOUS | Status: AC
  Administered 2018-11-05: 4 g via INTRAVENOUS
  Filled 2018-11-05: qty 100

## 2018-11-05 MED ORDER — CHLORDIAZEPOXIDE HCL 10 MG PO CAPS: 10.0000 mg | ORAL_CAPSULE | Freq: Three times a day (TID) | ORAL | 0 refills | Status: DC

## 2018-11-05 MED ORDER — LORAZEPAM 2 MG/ML IJ SOLN
2.0000 mg | Freq: Once | INTRAMUSCULAR | Status: AC
  Administered 2018-11-05: 2 mg via INTRAVENOUS

## 2018-11-05 MED ORDER — HALOPERIDOL LACTATE 5 MG/ML IJ SOLN
5.0000 mg | Freq: Four times a day (QID) | INTRAMUSCULAR | Status: DC | PRN
  Administered 2018-11-05: 5 mg via INTRAVENOUS

## 2018-11-05 NOTE — Progress Notes (Signed)
Pt being discharged at this time, discharging to [facility] lodge where he has been staying via taxi [employer], discharge instructions and prescription reviewed with pt, states understanding, pt strongly advised to quit drinking, pt agrees and states understanding, no complaints at discharge

## 2018-11-05 NOTE — Progress Notes (Signed)
PHARMACY CONSULT NOTE - FOLLOW UP  Pharmacy Consult for Electrolyte Monitoring and Replacement   Recent Labs: Potassium (mmol/L)  Date Value  11/05/2018 3.3 (L)  10/17/2013 3.3 (L)   Magnesium (mg/dL)  Date Value  79/48/0165 1.2 (L)   Calcium (mg/dL)  Date Value  53/74/8270 8.0 (L)   Calcium, Total (mg/dL)  Date Value  78/67/5449 7.5 (L)   Albumin (g/dL)  Date Value  20/06/711 3.2 (L)  10/17/2013 2.7 (L)   Phosphorus (mg/dL)  Date Value  19/75/8832 4.6   Sodium (mmol/L)  Date Value  11/05/2018 136  10/17/2013 140     Assessment: 63 year old male here for alcohol detox per patient  Goal of Therapy:  Potassium ~ 4 Magnesium ~ 2  Plan:   Mag 4 g IV x 1 dose.  KCL 20 mEq PO x 2 doses.  Patient also receiving NS w/ K 20 mEq/L at 75 mL/hr.  Recheck electrolyte levels with am labs.   Stormy Card, [facility] 11/05/2018 8:21 AM

## 2018-11-05 NOTE — Clinical Social Work Note (Signed)
The CSW received a verbal consult that patient needs resources for substance use recovery. CSW will visit when able.   Cory Foster, MSW, Theresia Majors [phone]

## 2018-11-05 NOTE — Progress Notes (Addendum)
Patient getting out of bed unassisted, unsteady on his feet, stating he was anxious, CIWA 22, notified Dr. Sheryle Hail, received verbal order for 5mg  of haldol and 2mg  of ativan for withdrawal symptoms.

## 2018-11-05 NOTE — Discharge Summary (Signed)
[employer] [facility] [place] [facility]   PATIENT NAME: Cory Foster    MR#:  [id number]  DATE OF BIRTH:  [birth year]  DATE OF ADMISSION:  11/04/2018 ADMITTING PHYSICIAN: Adrian Saran, MD  DATE OF DISCHARGE: 11/05/2018  PRIMARY CARE PHYSICIAN: Etheleen Nicks, NP    ADMISSION DIAGNOSIS:  Hypokalemia [E87.6] Alcoholic cirrhosis of liver without ascites (HCC) [K70.30] Elevated transaminase level [R74.0] Alcoholic intoxication without complication (HCC) [F10.920]  DISCHARGE DIAGNOSIS:  Active Problems:   Elevated liver enzymes   SECONDARY DIAGNOSIS:   Past Medical History:  Diagnosis Date  . Anxiety   . DDD (degenerative disc disease)   . Depression   . GERD (gastroesophageal reflux disease)   . Hyperlipidemia   . Hypertension   . Psoriasis   . Substance abuse (HCC)   . Substance induced mood disorder (HCC)   . Varicose vein     HOSPITAL COURSE:   63 year old male with history of tobacco and EtOH dependence who presented to the ER for detox.  1.  Elevated LFTs due to new diagnosis of liver cirrhosis: Patient was evaluated by GI.  He will have outpatient GI follow-up.  Liver tests have improved.  Elevated liver tests are due to cirrhosis as well as EtOH abuse.  2.  EtOH abuse: Patient had uneventful detox.  He was referred to outpatient services.  He will be discharged on Librium taper.  3.Tobacco dependence: Patient is encouraged to quit smoking. Counseling was provided for 4 minutes.  4.  Electrolyte abnormalities: This was repleted  5.  Depression: Patient will continue on Zoloft  6.  Diabetes: Due to elevated LFTs metformin has been on hold for now.  He will follow-up with his primary care physician in 3 days. 7.  Hyperlipidemia: Statin will be held due to elevated LFTs.    DISCHARGE CONDITIONS AND DIET:   Stable for discharge on diabetic diet  CONSULTS OBTAINED:  Treatment Team:  Pasty Spillers, MD  DRUG ALLERGIES:   Allergies   Allergen Reactions  . Ceclor [Cefaclor] Other (See Comments)    Reaction: unknown  . Ciprofloxacin Other (See Comments)    Reaction: unknown  . Citalopram Other (See Comments)    Reaction: Hyper   . Seroquel [Quetiapine Fumarate] Other (See Comments)    Reaction: "hung over" feeling    DISCHARGE MEDICATIONS:   Allergies as of 11/05/2018      Reactions   Ceclor [cefaclor] Other (See Comments)   Reaction: unknown   Ciprofloxacin Other (See Comments)   Reaction: unknown   Citalopram Other (See Comments)   Reaction: Hyper    Seroquel [quetiapine Fumarate] Other (See Comments)   Reaction: "hung over" feeling      Medication List    STOP taking these medications   atorvastatin 80 MG tablet Commonly known as:  LIPITOR   metFORMIN 1000 MG tablet Commonly known as:  GLUCOPHAGE     TAKE these medications   chlordiazePOXIDE 10 MG capsule Commonly known as:  LIBRIUM Take 1 capsule (10 mg total) by mouth 3 (three) times daily. Take 10 mg 3 times a day for 2 days then 10 mg twice a day for 2 days then 10 mg daily for 2 days then stop   midodrine 5 MG tablet Commonly known as:  PROAMATINE Take 1 tablet (5 mg total) by mouth 3 (three) times daily with meals for 30 days.   nicotine 21 mg/24hr patch Commonly known as:  NICODERM CQ - dosed in mg/24 hours Place 1  patch (21 mg total) onto the skin daily.   sertraline 100 MG tablet Commonly known as:  ZOLOFT Take 1 tablet (100 mg total) by mouth 2 (two) times daily for 30 days.   thiamine 100 MG tablet Take 1 tablet (100 mg total) by mouth daily for 30 days.   traZODone 50 MG tablet Commonly known as:  DESYREL Take 1 tablet (50 mg total) by mouth at bedtime for 30 days.         Today   CHIEF COMPLAINT:  Patient would like to go home today.  He reports that he will stop drinking EtOH.   VITAL SIGNS:  Blood pressure 130/76, pulse 91, temperature 98.5 F (36.9 C), temperature source Oral, resp. rate 16, height 6\' 2"   (1.88 m), weight 102.1 kg, SpO2 96 %.   REVIEW OF SYSTEMS:  Review of Systems  Constitutional: Negative.  Negative for chills, fever and malaise/fatigue.  HENT: Negative.  Negative for ear discharge, ear pain, hearing loss, nosebleeds and sore throat.   Eyes: Negative.  Negative for blurred vision and pain.  Respiratory: Negative.  Negative for cough, hemoptysis, shortness of breath and wheezing.   Cardiovascular: Negative.  Negative for chest pain, palpitations and leg swelling.  Gastrointestinal: Negative.  Negative for abdominal pain, blood in stool, diarrhea, nausea and vomiting.  Genitourinary: Negative.  Negative for dysuria.  Musculoskeletal: Negative.  Negative for back pain.  Skin: Negative.   Neurological: Negative for dizziness, tremors, speech change, focal weakness, seizures and headaches.  Endo/Heme/Allergies: Negative.  Does not bruise/bleed easily.  Psychiatric/Behavioral: Negative.  Negative for depression, hallucinations and suicidal ideas.     PHYSICAL EXAMINATION:  GENERAL:  63 y.o.-year-old patient lying in the bed with no acute distress.  NECK:  Supple, no jugular venous distention. No thyroid enlargement, no tenderness.  LUNGS: Normal breath sounds bilaterally, no wheezing, rales,rhonchi  No use of accessory muscles of respiration.  CARDIOVASCULAR: S1, S2 normal. No murmurs, rubs, or gallops.  ABDOMEN: Soft, non-tender, non-distended. Bowel sounds present. No organomegaly or mass.  EXTREMITIES: No pedal edema, cyanosis, or clubbing.  PSYCHIATRIC: The patient is alert and oriented x 3.  SKIN: No obvious rash, lesion, or ulcer.   DATA REVIEW:   CBC Recent Labs  Lab 11/05/18 0353  WBC 4.2  HGB 12.0*  HCT 33.9*  PLT 80*    Chemistries  Recent Labs  Lab 11/05/18 0353  NA 136  K 3.3*  CL 99  CO2 27  GLUCOSE 144*  BUN 14  CREATININE 1.05  CALCIUM 8.0*  MG 1.2*  AST 565*  ALT 946*  ALKPHOS 125  BILITOT 4.8*    Cardiac Enzymes No results  for input(s): TROPONINI in the last 168 hours.  Microbiology Results  @MICRORSLT48 @  RADIOLOGY:  [place] Abdomen Limited Ruq  Result Date: 11/04/2018 CLINICAL DATA:  Initial evaluation for elevated LFTs, alcohol intoxication. EXAM: ULTRASOUND ABDOMEN LIMITED RIGHT UPPER QUADRANT COMPARISON:  Prior radiograph from 02/25/2018. FINDINGS: Gallbladder: Surgically absent. Common bile duct: Diameter: 3.6 mm Liver: No focal lesion identified. Liver demonstrates a coarse heterogeneous and increased echotexture with nodular contour, suggesting cirrhosis. Portal vein is patent on color Doppler imaging with normal direction of blood flow towards the liver. IMPRESSION: 1. Findings compatible with hepatic cirrhosis. No focal intrahepatic lesions identified by sonography. 2. Prior cholecystectomy.  No biliary dilatation. Electronically Signed   By: Rise MuBenjamin  McClintock M.D.   On: 11/04/2018 06:22      Allergies as of 11/05/2018      Reactions  Ceclor [cefaclor] Other (See Comments)   Reaction: unknown   Ciprofloxacin Other (See Comments)   Reaction: unknown   Citalopram Other (See Comments)   Reaction: Hyper    Seroquel [quetiapine Fumarate] Other (See Comments)   Reaction: "hung over" feeling      Medication List    STOP taking these medications   atorvastatin 80 MG tablet Commonly known as:  LIPITOR   metFORMIN 1000 MG tablet Commonly known as:  GLUCOPHAGE     TAKE these medications   chlordiazePOXIDE 10 MG capsule Commonly known as:  LIBRIUM Take 1 capsule (10 mg total) by mouth 3 (three) times daily. Take 10 mg 3 times a day for 2 days then 10 mg twice a day for 2 days then 10 mg daily for 2 days then stop   midodrine 5 MG tablet Commonly known as:  PROAMATINE Take 1 tablet (5 mg total) by mouth 3 (three) times daily with meals for 30 days.   nicotine 21 mg/24hr patch Commonly known as:  NICODERM CQ - dosed in mg/24 hours Place 1 patch (21 mg total) onto the skin daily.   sertraline  100 MG tablet Commonly known as:  ZOLOFT Take 1 tablet (100 mg total) by mouth 2 (two) times daily for 30 days.   thiamine 100 MG tablet Take 1 tablet (100 mg total) by mouth daily for 30 days.   traZODone 50 MG tablet Commonly known as:  DESYREL Take 1 tablet (50 mg total) by mouth at bedtime for 30 days.         Management plans discussed with the patient and he is in agreement. Stable for discharge home  Patient should follow up with pcp  CODE STATUS:     Code Status Orders  (From admission, onward)         Start     Ordered   11/04/18 0922  Full code  Continuous     11/04/18 0921        Code Status History    Date Active Date Inactive Code Status Order ID Comments User Context   10/14/2018 1356 10/28/2018 2018 Full Code [id number]  He, Jun, MD Inpatient   01/02/2018 0152 01/02/2018 0252 Full Code [id number]  Audery Amellapacs, John T, MD Inpatient   01/01/2018 2233 01/02/2018 0151 Full Code [id number]  Gigi GinHammonds, Kathleen H, MD ED   08/20/2013 1953 08/21/2013 0152 Full Code [id number]  Ward, Layla MawKristen N, DO ED   08/17/2013 2042 08/18/2013 1815 Full Code [id number]  Roxy HorsemanBrowning, Robert, PA-C ED   08/07/2013 1909 08/08/2013 0319 Full Code [id number]  Santiago GladLaisure, Heather, PA-C ED      TOTAL TIME TAKING CARE OF THIS PATIENT: 38 minutes.    Note: This dictation was prepared with Dragon dictation along with smaller phrase technology. Any transcriptional errors that result from this process are unintentional.  Laronn Devonshire M.D on 11/05/2018 at 9:44 AM  Between 7am to 6pm - Pager - [phone] After 6pm go to [email or web address] - Social research officer, governmentpassword [email or web address] [email or web address]  [facility]  Office  [phone]  CC: Primary care physician; Etheleen NicksMacDonald, Keri, NP

## 2018-11-06 LAB — HEPATITIS B SURFACE ANTIGEN: Hepatitis B Surface Ag: NEGATIVE

## 2018-11-06 LAB — HEPATITIS B CORE ANTIBODY, TOTAL: Hep B Core Total Ab: NEGATIVE

## 2018-11-06 LAB — HEPATITIS PANEL, ACUTE
HCV Ab: <0.1 s/co ratio (ref 0.0–0.9)
HEP B C IGM: NEGATIVE
Hep A IgM: NEGATIVE
Hepatitis B Surface Ag: NEGATIVE

## 2018-11-06 LAB — HEPATITIS B CORE ANTIBODY, IGM: Hep B C IgM: NEGATIVE

## 2018-11-06 LAB — AFP TUMOR MARKER: AFP, SERUM, TUMOR MARKER: 2.4 ng/mL (ref 0.0–8.3)

## 2018-11-06 LAB — HCV COMMENT:

## 2018-11-06 LAB — HEPATITIS A ANTIBODY, TOTAL: Hep A Total Ab: NEGATIVE

## 2018-11-06 LAB — HEPATITIS C ANTIBODY (REFLEX): HCV Ab: <0.1 s/co ratio (ref 0.0–0.9)

## 2018-11-06 LAB — HEPATITIS B SURFACE ANTIBODY, QUANTITATIVE: Hep B S AB Quant (Post): <3.1 m[IU]/mL — ABNORMAL LOW (ref >9.9)

## 2018-11-06 LAB — HEPATITIS A ANTIBODY, IGM: Hep A IgM: NEGATIVE

## 2018-11-07 ENCOUNTER — Telehealth: Payer: Self-pay | Admitting: Gastroenterology

## 2018-11-07 NOTE — Telephone Encounter (Signed)
-----   Message from Varnita B Tahiliani, MD sent at 11/07/2018  1:07 PM EST ----- ° Please set up clinic appointment (f/u appt - 15 min slot) with me in 3-4 weeks for elevated liver enzymes ° °

## 2018-11-07 NOTE — Telephone Encounter (Signed)
Unable to leave message to schedule in 3-4 weeks for elevated liver enzymes  Per Dr. Karie Schwalbe

## 2018-11-08 ENCOUNTER — Telehealth: Payer: Self-pay | Admitting: Gastroenterology

## 2018-11-08 LAB — HEPATITIS C VRS RNA DETECT BY PCR-QUAL: HEPATITIS C VRS RNA BY PCR-QUAL: NEGATIVE

## 2018-11-08 NOTE — Telephone Encounter (Signed)
-----   Message from Varnita B Tahiliani, MD sent at 11/07/2018  1:07 PM EST ----- ° Please set up clinic appointment (f/u appt - 15 min slot) with me in 3-4 weeks for elevated liver enzymes ° °

## 2018-11-10 ENCOUNTER — Other Ambulatory Visit: Payer: Self-pay

## 2018-11-10 ENCOUNTER — Emergency Department
Admission: EM | Admit: 2018-11-10 | Discharge: 2018-11-10 | Disposition: A | Discharge status: Home / Self Care | Payer: Medicare PPO | Attending: Emergency Medicine | Admitting: Emergency Medicine

## 2018-11-10 DIAGNOSIS — F17210 Nicotine dependence, cigarettes, uncomplicated: Secondary | ICD-10-CM | POA: Diagnosis not present

## 2018-11-10 DIAGNOSIS — I10 Essential (primary) hypertension: Secondary | ICD-10-CM | POA: Diagnosis not present

## 2018-11-10 DIAGNOSIS — R45851 Suicidal ideations: Secondary | ICD-10-CM | POA: Insufficient documentation

## 2018-11-10 DIAGNOSIS — E785 Hyperlipidemia, unspecified: Secondary | ICD-10-CM | POA: Insufficient documentation

## 2018-11-10 DIAGNOSIS — F10929 Alcohol use, unspecified with intoxication, unspecified: Secondary | ICD-10-CM | POA: Diagnosis present

## 2018-11-10 DIAGNOSIS — F1994 Other psychoactive substance use, unspecified with psychoactive substance-induced mood disorder: Secondary | ICD-10-CM | POA: Diagnosis not present

## 2018-11-10 DIAGNOSIS — Z79899 Other long term (current) drug therapy: Secondary | ICD-10-CM | POA: Diagnosis not present

## 2018-11-10 LAB — COMPREHENSIVE METABOLIC PANEL
ALT: 315 U/L — ABNORMAL HIGH (ref 0–44)
AST: 158 U/L — ABNORMAL HIGH (ref 15–41)
Albumin: 3 g/dL — ABNORMAL LOW (ref 3.5–5.0)
Alkaline Phosphatase: 225 U/L — ABNORMAL HIGH (ref 38–126)
Anion gap: 7 (ref 5–15)
BILIRUBIN TOTAL: 2.4 mg/dL — AB (ref 0.3–1.2)
BUN: 14 mg/dL (ref 8–23)
CO2: 31 mmol/L (ref 22–32)
Calcium: 8 mg/dL — ABNORMAL LOW (ref 8.9–10.3)
Chloride: 102 mmol/L (ref 98–111)
Creatinine, Ser: 0.88 mg/dL (ref 0.61–1.24)
GFR calc Af Amer: >60 mL/min (ref >60)
GFR calc non Af Amer: >60 mL/min (ref >60)
Glucose, Bld: 226 mg/dL — ABNORMAL HIGH (ref 70–99)
Potassium: 3.5 mmol/L (ref 3.5–5.1)
Sodium: 140 mmol/L (ref 135–145)
Total Protein: 6.7 g/dL (ref 6.5–8.1)

## 2018-11-10 LAB — ETHANOL: Alcohol, Ethyl (B): 326 mg/dL (ref <10)

## 2018-11-10 LAB — CBC
HCT: 41.1 % (ref 39.0–52.0)
Hemoglobin: 14.1 g/dL (ref 13.0–17.0)
MCH: 32.6 pg (ref 26.0–34.0)
MCHC: 34.3 g/dL (ref 30.0–36.0)
MCV: 94.9 fL (ref 80.0–100.0)
Platelets: 140 10*3/uL — ABNORMAL LOW (ref 150–400)
RBC: 4.33 MIL/uL (ref 4.22–5.81)
RDW: 14.1 % (ref 11.5–15.5)
WBC: 9.3 10*3/uL (ref 4.0–10.5)
nRBC: 0 % (ref 0.0–0.2)

## 2018-11-10 LAB — ACETAMINOPHEN LEVEL: Acetaminophen (Tylenol), Serum: <10 ug/mL — ABNORMAL LOW (ref 10–30)

## 2018-11-10 LAB — SALICYLATE LEVEL: Salicylate Lvl: <7 mg/dL (ref 2.8–30.0)

## 2018-11-10 MED ORDER — ACETAMINOPHEN 325 MG PO TABS
650.0000 mg | ORAL_TABLET | Freq: Once | ORAL | Status: AC
  Administered 2018-11-10: 650 mg via ORAL

## 2018-11-10 MED ORDER — ACETAMINOPHEN 500 MG PO TABS
1000.0000 mg | ORAL_TABLET | Freq: Once | ORAL | Status: AC
  Administered 2018-11-10: 1000 mg via ORAL
  Filled 2018-11-10: qty 2

## 2018-11-10 MED ORDER — ACETAMINOPHEN 325 MG PO TABS
ORAL_TABLET | ORAL | Status: AC
  Administered 2018-11-10: 07:00:00
  Filled 2018-11-10: qty 2

## 2018-11-10 MED ORDER — VITAMIN B-1 100 MG PO TABS
100.0000 mg | ORAL_TABLET | Freq: Every day | ORAL | Status: DC
  Administered 2018-11-10: 100 mg via ORAL
  Filled 2018-11-10: qty 1

## 2018-11-10 MED ORDER — THIAMINE HCL 100 MG/ML IJ SOLN: 100.0000 mg | Freq: Every day | INTRAMUSCULAR | Status: DC

## 2018-11-10 MED ORDER — LORAZEPAM 2 MG/ML IJ SOLN: 0.0000 mg | Freq: Four times a day (QID) | INTRAMUSCULAR | Status: DC

## 2018-11-10 MED ORDER — LORAZEPAM 2 MG/ML IJ SOLN: 0.0000 mg | Freq: Two times a day (BID) | INTRAMUSCULAR | Status: DC

## 2018-11-10 MED ORDER — LORAZEPAM 2 MG PO TABS: 0.0000 mg | ORAL_TABLET | Freq: Two times a day (BID) | ORAL | Status: DC

## 2018-11-10 MED ORDER — LORAZEPAM 2 MG PO TABS
0.0000 mg | ORAL_TABLET | Freq: Four times a day (QID) | ORAL | Status: DC
  Administered 2018-11-10 (×2): 2 mg via ORAL
  Filled 2018-11-10 (×2): qty 1

## 2018-11-10 NOTE — ED Provider Notes (Signed)
-----------------------------------------   2:45 PM on 11/10/2018 -----------------------------------------  The patient is alert and clinically sober.  He has no SI or HI.  He is stable for discharge at this time.  Return precautions given.   Dionne Bucy, MD 11/10/18 1445

## 2018-11-10 NOTE — ED Notes (Signed)
Hourly rounding reveals patient sleeping in room. No complaints, stable, in no acute distress. Q15 minute rounds and monitoring via [employer] to continue. 

## 2018-11-10 NOTE — ED Notes (Signed)
Patient is talking with social worker from [employer] adult protective services, patient also has discharge orders , nurse gave Patient a bus ticket.

## 2018-11-10 NOTE — ED Provider Notes (Signed)
[facility] Emergency Department Provider Note  ____________________________________________   First MD Initiated Contact with Patient 11/10/18 0024     (approximate)  I have reviewed the triage vital signs and the nursing notes.   HISTORY  Chief Complaint Alcohol Intoxication    HPI Cory AxonRandall McDougal Sieloff Jr. is a 63 y.o. male with below list of chronic medical conditions including substance abuse and substance-induced mood disorder presents to the emergency department with request for alcohol detox and suicidal ideation.  Patient states that he has had 5 beers tonight.  Patient states that he has been to detox twice in the past most recently last week.  Patient is requesting to return to detox at this time.  In addition patient admits to suicidal ideation without plan.   Past Medical History:  Diagnosis Date  . Anxiety   . DDD (degenerative disc disease)   . Depression   . GERD (gastroesophageal reflux disease)   . Hyperlipidemia   . Hypertension   . Psoriasis   . Substance abuse (HCC)   . Substance induced mood disorder (HCC)   . Varicose vein     Patient Active Problem List   Diagnosis Date Noted  . Elevated liver enzymes 11/04/2018  . Substance induced mood disorder (HCC) 08/02/2018  . Acute respiratory failure with hypercapnia (HCC) 01/01/2018  . Severe recurrent major depression without psychotic features (HCC) 12/14/2017  . Diabetes (HCC) 12/14/2017  . Hypertension   . Hyperlipidemia   . GERD (gastroesophageal reflux disease)   . Depression   . Substance abuse (HCC)   . Anxiety   . DDD (degenerative disc disease)   . Psoriasis   . Alcohol dependence (HCC) 08/08/2013  . MDD (major depressive disorder) 08/08/2013  . GAD (generalized anxiety disorder) 08/08/2013    Past Surgical History:  Procedure Laterality Date  . CHOLECYSTECTOMY    . SINUSOTOMY    . VEIN LIGATION AND STRIPPING      Prior to Admission medications    Medication Sig Start Date End Date Taking? Authorizing Provider  chlordiazePOXIDE (LIBRIUM) 10 MG capsule Take 1 capsule (10 mg total) by mouth 3 (three) times daily. Take 10 mg 3 times a day for 2 days then 10 mg twice a day for 2 days then 10 mg daily for 2 days then stop 11/05/18   Adrian SaranMody, Sital, MD  midodrine (PROAMATINE) 5 MG tablet Take 1 tablet (5 mg total) by mouth 3 (three) times daily with meals for 30 days. 10/28/18 11/27/18  Terance HartStephens, Wayland C, MD  nicotine (NICODERM CQ - DOSED IN MG/24 HOURS) 21 mg/24hr patch Place 1 patch (21 mg total) onto the skin daily. 10/29/18   Terance HartStephens, Wayland C, MD  sertraline (ZOLOFT) 100 MG tablet Take 1 tablet (100 mg total) by mouth 2 (two) times daily for 30 days. 10/28/18 11/27/18  Terance HartStephens, Wayland C, MD  thiamine 100 MG tablet Take 1 tablet (100 mg total) by mouth daily for 30 days. 10/28/18 11/27/18  Terance HartStephens, Wayland C, MD  traZODone (DESYREL) 50 MG tablet Take 1 tablet (50 mg total) by mouth at bedtime for 30 days. 10/28/18 11/27/18  Terance HartStephens, Wayland C, MD    Allergies Ceclor [cefaclor]; Ciprofloxacin; Citalopram; and Seroquel [quetiapine fumarate]  Family History  Problem Relation Age of Onset  . Depression Mother   . Anxiety disorder Mother   . Cancer Father     Social History Social History   Tobacco Use  . Smoking status: Current Every Day Smoker  Packs/day: 1.00    Types: Cigarettes  . Smokeless tobacco: Never Used  Substance Use Topics  . Alcohol use: Yes    Alcohol/week: 1.0 standard drinks    Types: 1 Cans of beer per week    Comment: bi-weekly  . Drug use: No    Review of Systems Constitutional: No fever/chills Eyes: No visual changes. ENT: No sore throat. Cardiovascular: Denies chest pain. Respiratory: Denies shortness of breath. Gastrointestinal: No abdominal pain.  No nausea, no vomiting.  No diarrhea.  No constipation. Genitourinary: Negative for dysuria. Musculoskeletal: Negative for neck pain.  Negative for back  pain. Integumentary: Negative for rash. Neurological: Negative for headaches, focal weakness or numbness. Psychiatric:Positive for alcohol intoxication and suicidal ideation.   ____________________________________________   PHYSICAL EXAM:  VITAL SIGNS: ED Triage Vitals  Enc Vitals Group     BP 11/10/18 0026 125/76     Pulse Rate 11/10/18 0026 81     Resp 11/10/18 0026 20     Temp 11/10/18 0026 98.5 F (36.9 C)     Temp Source 11/10/18 0026 Oral     SpO2 11/10/18 0026 99 %     Weight 11/10/18 0021 102 kg (224 lb 13.9 oz)     Height 11/10/18 0021 1.88 m (6\' 2" )     Head Circumference --      Peak Flow --      Pain Score 11/10/18 0020 0     Pain Loc --      Pain Edu? --      Excl. in GC? --     Constitutional: Alert and oriented.  Appears intoxicated Eyes: Conjunctivae are normal. PERRL. EOMI. Head: Atraumatic. Mouth/Throat: Mucous membranes are moist. Oropharynx non-erythematous. Neck: No stridor.   Cardiovascular: Normal rate, regular rhythm. Good peripheral circulation. Grossly normal heart sounds. Respiratory: Normal respiratory effort.  No retractions. Lungs CTAB. Gastrointestinal: Soft and nontender. No distention.  Musculoskeletal: No lower extremity tenderness nor edema. No gross deformities of extremities. Neurologic: Slurred speech.  No gross focal neurologic deficits are appreciated.  Skin:  Skin is warm, dry and intact. No rash noted. Psychiatric: Peers intoxicated  ____________________________________________   LABS (all labs ordered are listed, but only abnormal results are displayed)  Labs Reviewed  COMPREHENSIVE METABOLIC PANEL - Abnormal; Notable for the following components:      Result Value   Glucose, Bld 226 (*)    Calcium 8.0 (*)    Albumin 3.0 (*)    AST 158 (*)    ALT 315 (*)    Alkaline Phosphatase 225 (*)    Total Bilirubin 2.4 (*)    All other components within normal limits  ETHANOL - Abnormal; Notable for the following components:    Alcohol, Ethyl (B) 326 (*)    All other components within normal limits  CBC - Abnormal; Notable for the following components:   Platelets 140 (*)    All other components within normal limits  ACETAMINOPHEN LEVEL - Abnormal; Notable for the following components:   Acetaminophen (Tylenol), Serum <10 (*)    All other components within normal limits  SALICYLATE LEVEL  URINE DRUG SCREEN, QUALITATIVE ([facility] ONLY)     Procedures   ____________________________________________   INITIAL IMPRESSION / ASSESSMENT AND PLAN / ED COURSE  As part of my medical decision making, I reviewed the following data within the electronic MEDICAL RECORD NUMBER   63 year old male presenting with above-stated history and physical exam consistent with acute alcohol intoxication with stated suicidal ideation.  Awaiting  psychiatry consultation at this time.  Patient's laboratory data notable for a EtOH of 326 and glucose of 226.  Patient also noted to have elevated liver enzymes AST 158 ALT 315 which is improved since the patient's visit on 11/05/2018    ____________________________________________  FINAL CLINICAL IMPRESSION(S) / ED DIAGNOSES  Final diagnoses:  Alcoholic intoxication with complication (HCC)  Suicidal ideation     MEDICATIONS GIVEN DURING THIS VISIT:  Medications  LORazepam (ATIVAN) injection 0-4 mg ( Intravenous See Alternative 11/10/18 0044)    Or  LORazepam (ATIVAN) tablet 0-4 mg (2 mg Oral Given 11/10/18 0044)  LORazepam (ATIVAN) injection 0-4 mg (has no administration in time range)    Or  LORazepam (ATIVAN) tablet 0-4 mg (has no administration in time range)  thiamine (VITAMIN B-1) tablet 100 mg (has no administration in time range)    Or  thiamine (B-1) injection 100 mg (has no administration in time range)     ED Discharge Orders    None       Note:  This document was prepared using Dragon voice recognition software and may include unintentional dictation errors.   Darci Current, MD 11/10/18 [phone]

## 2018-11-10 NOTE — BH Assessment (Signed)
Pt unable to receive detox treatment at [facility] due to having Medicare as his insurance provider.

## 2018-11-10 NOTE — BH Assessment (Signed)
Assessment Note  Cory Foster. is an 63 y.o. male. Who has primary complaint of detox and suicidal ideation. Pt self reports a history of bipolar disorder. He share that he has had a plan to hang himself with a belt for two weeks. Pt reports homelessness and lack of natural supports. Pt. denies any suicidal ideation, plan or intent. Pt. denies the presence of any auditory or visual hallucinations at this time. Patient expresses extreme withdraws sx including nausea increase anxiety and tremors.   Diagnosis: substance induced mood disorder   Past Medical History:  Past Medical History:  Diagnosis Date  . Anxiety   . DDD (degenerative disc disease)   . Depression   . GERD (gastroesophageal reflux disease)   . Hyperlipidemia   . Hypertension   . Psoriasis   . Substance abuse (HCC)   . Substance induced mood disorder (HCC)   . Varicose vein     Past Surgical History:  Procedure Laterality Date  . CHOLECYSTECTOMY    . SINUSOTOMY    . VEIN LIGATION AND STRIPPING      Family History:  Family History  Problem Relation Age of Onset  . Depression Mother   . Anxiety disorder Mother   . Cancer Father     Social History:  reports that he has been smoking cigarettes. He has been smoking about 1.00 pack per day. He has never used smokeless tobacco. He reports current alcohol use of about 1.0 standard drinks of alcohol per week. He reports that he does not use drugs.  Additional Social History:  Alcohol / Drug Use Pain Medications: SEE PTA  Prescriptions: SEE PTA  Over the Counter: SEE PTA  History of alcohol / drug use?: Yes Longest period of sobriety (when/how long): One year  Negative Consequences of Use: Personal relationships, Financial Withdrawal Symptoms: Tremors Substance #1 Name of Substance 1: Alcohol  1 - Age of First Use: 19 1 - Amount (size/oz): Unknown 1 - Frequency: Daily  1 - Duration: Ongoing  1 - Last Use / Amount: 10/13/2018  CIWA: CIWA-Ar BP:  125/76 Pulse Rate: 81 Nausea and Vomiting: mild nausea with no vomiting Tactile Disturbances: none Tremor: two Auditory Disturbances: not present Paroxysmal Sweats: no sweat visible Visual Disturbances: not present Anxiety: moderately anxious, or guarded, so anxiety is inferred Headache, Fullness in Head: none present Agitation: somewhat more than normal activity Orientation and Clouding of Sensorium: cannot do serial additions or is uncertain about date CIWA-Ar Total: 9 COWS:    Allergies:  Allergies  Allergen Reactions  . Ceclor [Cefaclor] Other (See Comments)    Reaction: unknown  . Ciprofloxacin Other (See Comments)    Reaction: unknown  . Citalopram Other (See Comments)    Reaction: Hyper   . Seroquel [Quetiapine Fumarate] Other (See Comments)    Reaction: "hung over" feeling    Home Medications: (Not in a hospital admission)   OB/GYN Status:  No LMP for male patient.  General Assessment Data Location of Assessment: [facility] ED TTS Assessment: In system Is this a Tele or Face-to-Face Assessment?: Face-to-Face Is this an Initial Assessment or a Re-assessment for this encounter?: Initial Assessment Patient Accompanied by:: N/A Marital status: Divorced Living Arrangements: Alone(HOMELESS) Can pt return to current living arrangement?: Yes Admission Status: Involuntary Petitioner: ED Attending Is patient capable of signing voluntary admission?: No Referral Source: Self/Family/Friend  Medical Screening Exam [facility] Walk-in ONLY) Medical Exam completed: Yes  Crisis Care Plan Living Arrangements: Alone(HOMELESS) Name of Psychiatrist: None reported Name of  Therapist: None reported  Education Status Is patient currently in school?: No Highest grade of school patient has completed: some college Is the patient employed, unemployed or receiving disability?: Unemployed  Risk to self with the past 6 months Suicidal Ideation: Yes-Currently Present Has patient been a risk  to self within the past 6 months prior to admission? : Yes Suicidal Intent: Yes-Currently Present Has patient had any suicidal intent within the past 6 months prior to admission? : Yes Is patient at risk for suicide?: Yes Suicidal Plan?: Yes-Currently Present Has patient had any suicidal plan within the past 6 months prior to admission? : Yes Specify Current Suicidal Plan: Hang self with belts Access to Means: Yes What has been your use of drugs/alcohol within the last 12 months?: alcohol abure  Previous Attempts/Gestures: No How many times?: 0 Other Self Harm Risks: alcohol use  Intentional Self Injurious Behavior: None Family Suicide History: No Recent stressful life event(s): Conflict (Comment), Financial Problems Persecutory voices/beliefs?: No Depression: Yes Depression Symptoms: Feeling angry/irritable, Feeling worthless/self pity, Loss of interest in usual pleasures Substance abuse history and/or treatment for substance abuse?: Yes Suicide prevention information given to non-admitted patients: Yes  Risk to Others within the past 6 months Homicidal Ideation: No Does patient have any lifetime risk of violence toward others beyond the six months prior to admission? : No Thoughts of Harm to Others: No Current Homicidal Intent: No Current Homicidal Plan: No Access to Homicidal Means: No Identified Victim: n History of harm to others?: No Assessment of Violence: None Noted Violent Behavior Description: n Does patient have access to weapons?: No Criminal Charges Pending?: No Does patient have a court date: No Is patient on probation?: No  Psychosis Hallucinations: Visual Delusions: None noted  Mental Status Report Appearance/Hygiene: In scrubs Eye Contact: Poor Motor Activity: Freedom of movement Speech: Logical/coherent, Soft Level of Consciousness: Alert Mood: Depressed Affect: Appropriate to circumstance Anxiety Level: None Thought Processes: Coherent Judgement:  Partial Orientation: Person, Time, Situation, Place, Appropriate for developmental age Obsessive Compulsive Thoughts/Behaviors: None  Cognitive Functioning Concentration: Fair Memory: Remote Intact, Recent Intact Is patient IDD: No Insight: Poor Impulse Control: Fair Appetite: Fair Have you had any weight changes? : No Change Sleep: No Change Total Hours of Sleep: 5 Vegetative Symptoms: None  ADLScreening [facility]) Patient's cognitive ability adequate to safely complete daily activities?: Yes Patient able to express need for assistance with ADLs?: Yes Independently performs ADLs?: Yes (appropriate for developmental age)  Prior Inpatient Therapy Prior Inpatient Therapy: Yes Prior Therapy Dates: 10/14/2018 [facility] Reason for Treatment: SI  Prior Outpatient Therapy Prior Outpatient Therapy: No Does patient have an ACCT team?: No Does patient have Intensive In-House Services?  : No Does patient have [facility] services? : No Does patient have P4CC services?: No  ADL Screening (condition at time of admission) Patient's cognitive ability adequate to safely complete daily activities?: Yes Patient able to express need for assistance with ADLs?: Yes Independently performs ADLs?: Yes (appropriate for developmental age)       Abuse/Neglect Assessment (Assessment to be complete while patient is alone) Abuse/Neglect Assessment Can Be Completed: Yes Physical Abuse: Denies Verbal Abuse: Denies Sexual Abuse: Denies Exploitation of patient/patient's resources: Denies Self-Neglect: Denies Values / Beliefs Cultural Requests During Hospitalization: None Spiritual Requests During Hospitalization: None Consults Spiritual Care Consult Needed: No Social Work Consult Needed: No Merchant navy officerAdvance Directives (For Healthcare) Does Patient Have a Medical Advance Directive?: No Would patient like information on creating a medical advance directive?:  No -  Patient declined          Disposition:  Disposition Initial Assessment Completed for this Encounter: Yes Patient referred to: Other (Comment)  On Site Evaluation by:   Reviewed with Physician:    Asa Saunas 11/10/2018 7:05 AM

## 2018-11-10 NOTE — ED Notes (Signed)
Nurse talked to Patient and He states that he wants to get sober and He just needs to get over the Hump" Patient denies Si/hi or avh at this time, nurse let him know that referrals had been sent out , Patient states " I want to just go downstairs, but nurse let him know that He needs detox and downstairs is not a option at this time, and He states that He would just wait and see what happens' Patient is calm and cooperative, nurse will continue to monitor.

## 2018-11-10 NOTE — ED Notes (Signed)
Patient ate 100% of breakfast and beverage.  

## 2018-11-10 NOTE — ED Notes (Signed)
Pt. Transferred to [facility] from ED to room 4 after screening for contraband. Report to include Situation, Background, Assessment and Recommendations from Hewan RN. Pt. Oriented to unit including Q15 minute rounds as well as the security cameras for their protection. Patient is alert and oriented, warm and dry in no acute distress. Patient denies SI, HI, and AVH. Pt. Encouraged to let me know if needs arise. 

## 2018-11-10 NOTE — ED Triage Notes (Signed)
Pt to ED via EMS from motel. Pt arrives very intoxicated states he drank 5 drinks tonight. Pt arrives stating he wants to die.

## 2018-11-10 NOTE — ED Notes (Signed)
Patient voiced understanding of discharge instructions, wanted to call the taxi service to pick him up to take him to the hotel that He is staying in, patient's belongings all returned to him.

## 2018-11-10 NOTE — BH Assessment (Signed)
TTS spoke with pt to inquire about his desire for detox treatment. Pt told this Clinical research associate that he is homeless and "sleeping anywhere I can find a safe place" because he was kicked out of his sister's house last week. This Clinical research associate inquired about pt's income amount and he reports that he receives $2,100.00 a month in disability benefits. When asked by this writer why he hasn't sought stable housing he reports "I don't know". He then changed his story and reported the he recently paid for a 3 week stay at the [facility].  TTS informed this Clinical research associate he should seek housing assistance at his local Dept of Social Service to secure safe and affordable housing.  Pt asked this writer if the hospital could pay for a taxi cab ride for him to return to the [facility]. This Clinical research associate informed him that he could be provided with a bus pass for transportation.  He then asked when would he be discharged from the ED.

## 2018-11-10 NOTE — ED Notes (Signed)
Patient ate 100% of lunch and beverage.  

## 2018-11-16 ENCOUNTER — Encounter: Payer: Self-pay | Admitting: Gastroenterology

## 2018-11-16 ENCOUNTER — Telehealth: Payer: Self-pay | Admitting: Gastroenterology

## 2018-11-16 NOTE — Telephone Encounter (Signed)
Unable to contact pt to schedule apt to see Dr. Karie Schwalbe letter sent.

## 2018-11-16 NOTE — Telephone Encounter (Signed)
-----   Message from Pasty Spillers, MD sent at 11/07/2018  1:07 PM EST -----  Please set up clinic appointment (f/u appt - 15 min slot) with me in 3-4 weeks for elevated liver enzymes

## 2018-11-22 ENCOUNTER — Telehealth: Payer: Self-pay | Admitting: Gastroenterology

## 2018-11-22 NOTE — Telephone Encounter (Signed)
Patient called & states he has quit drinking and his liver enzymes have come done. If he needs [place] he will call us.

## 2019-01-11 IMAGING — DX DG FOOT COMPLETE 3+V*L*
3 series · 4 of 4 positions shown · non-contrast
Comparison: None.

CLINICAL DATA: Left foot pain common no known injury, initial
encounter

EXAM:
LEFT FOOT - COMPLETE 3+ VIEW

[foot ap]
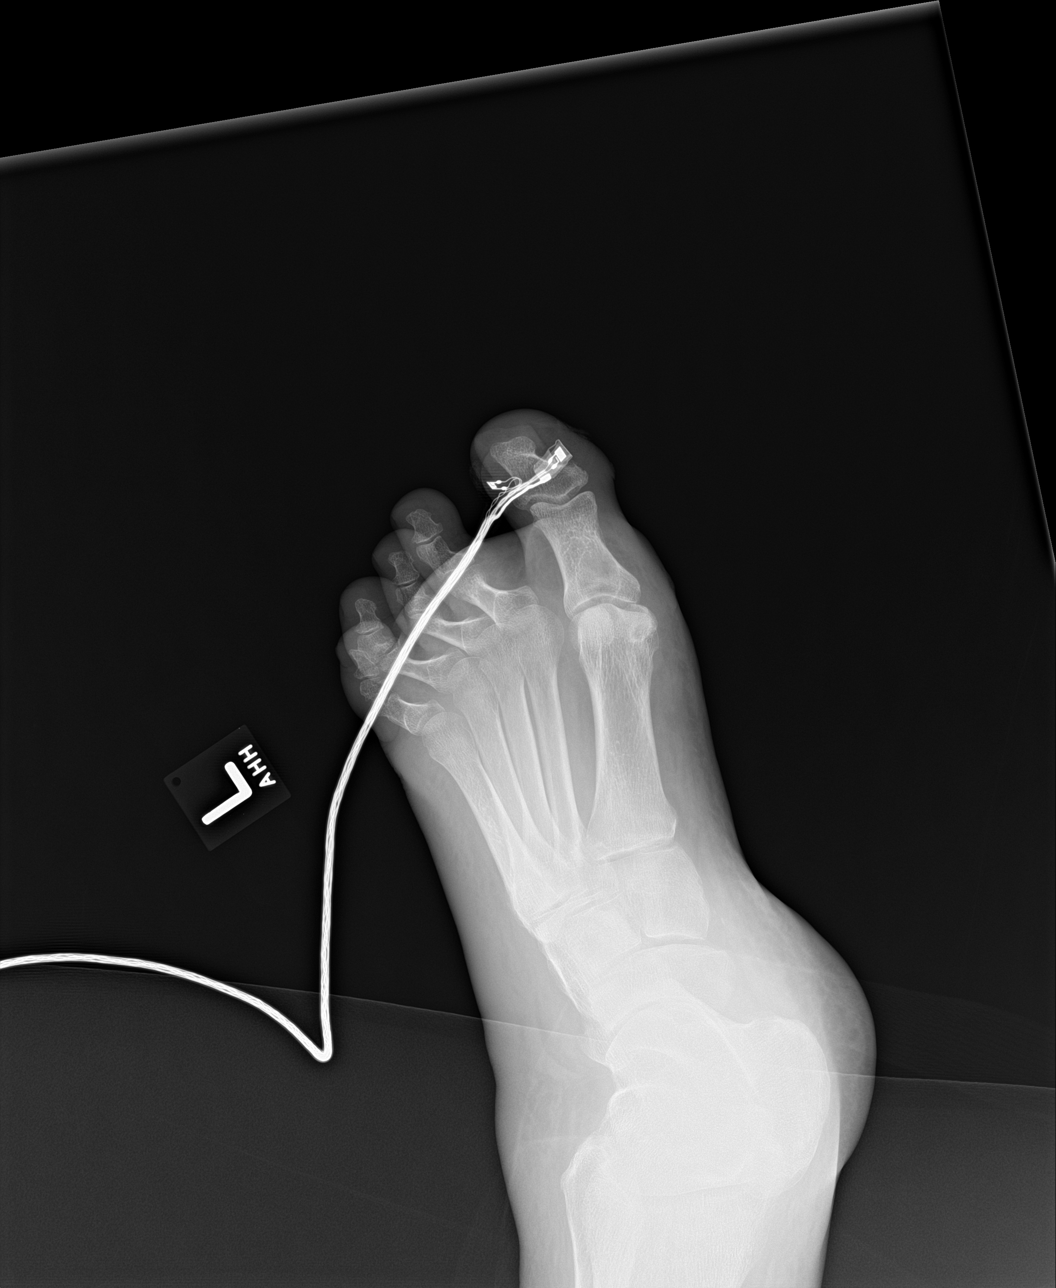

[Series 2: foot obl · 0.14mm/px · 2 of 2 slices shown]
[im 1/2]
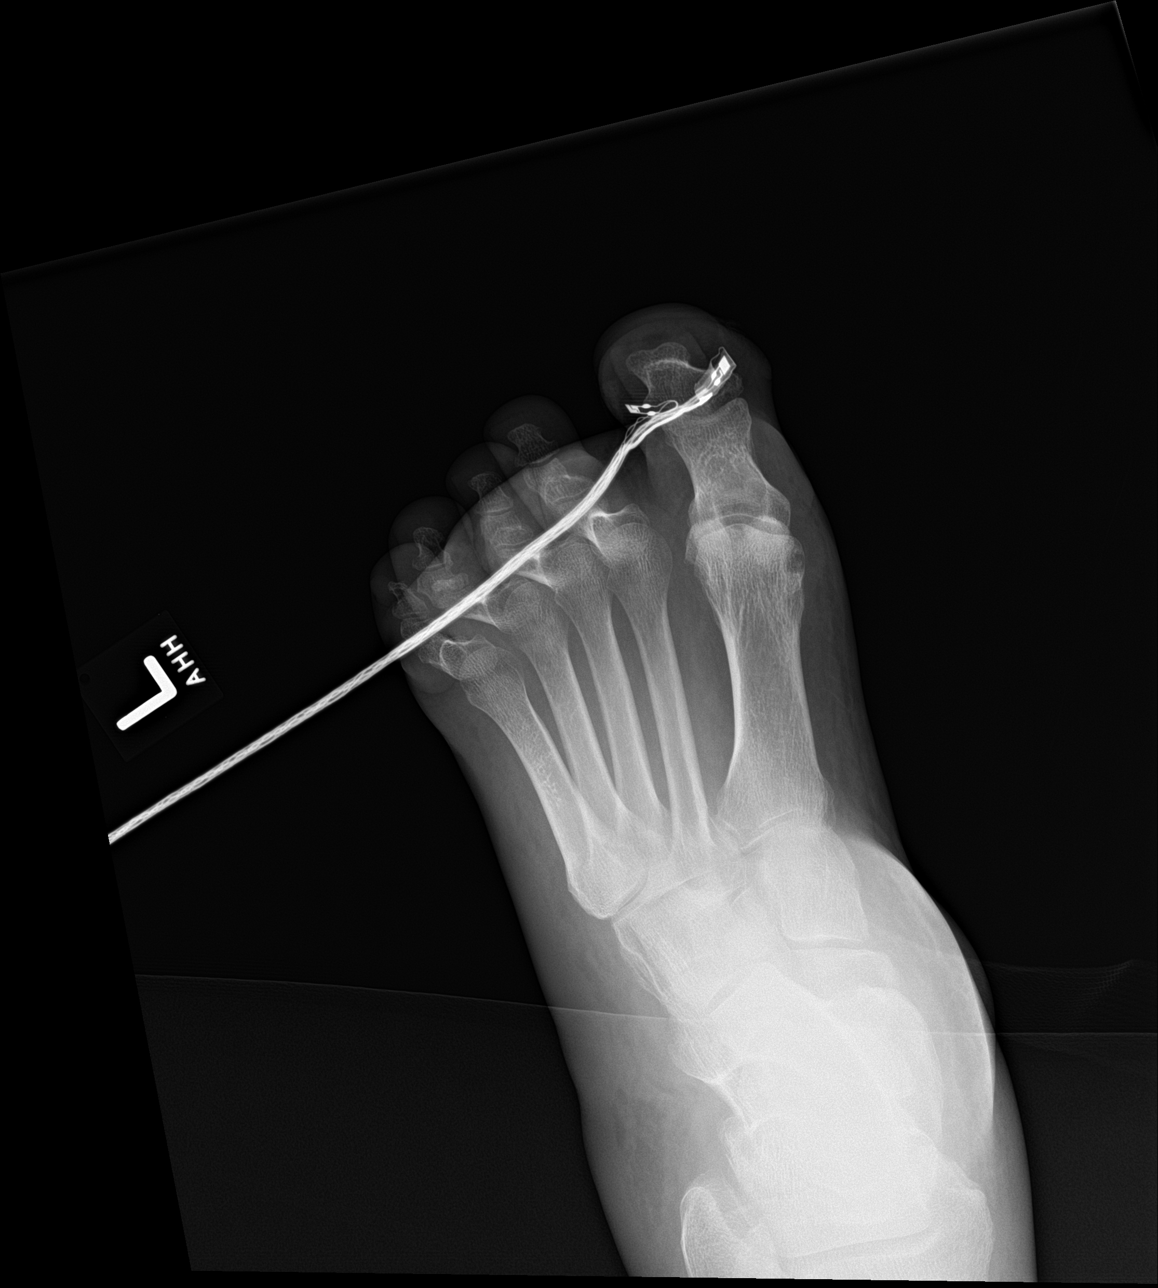
[im 2/2]
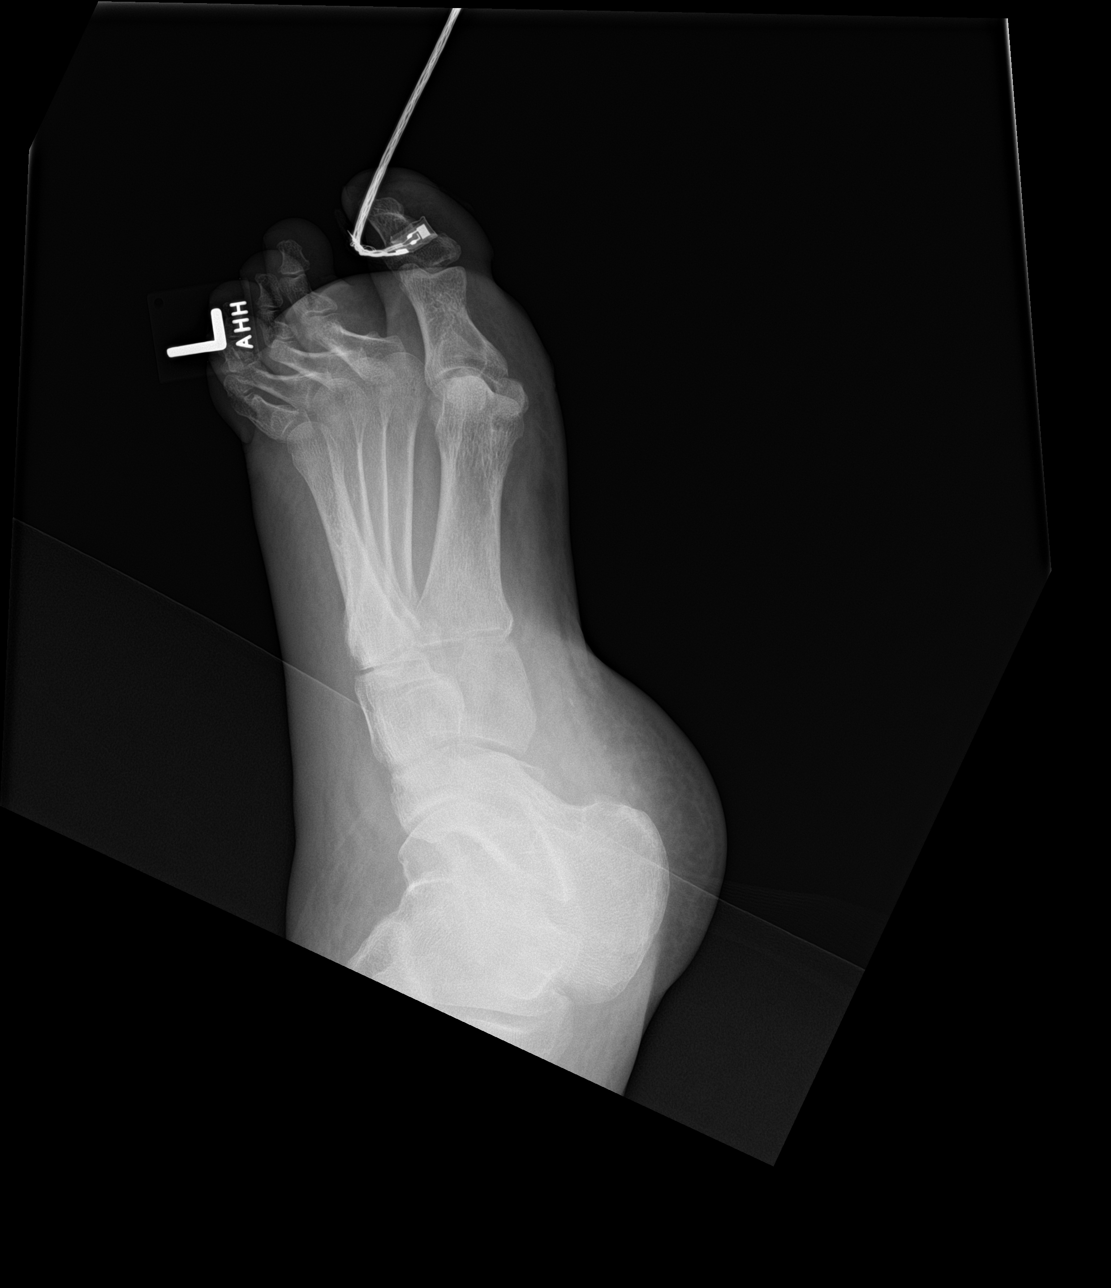

[foot lat]
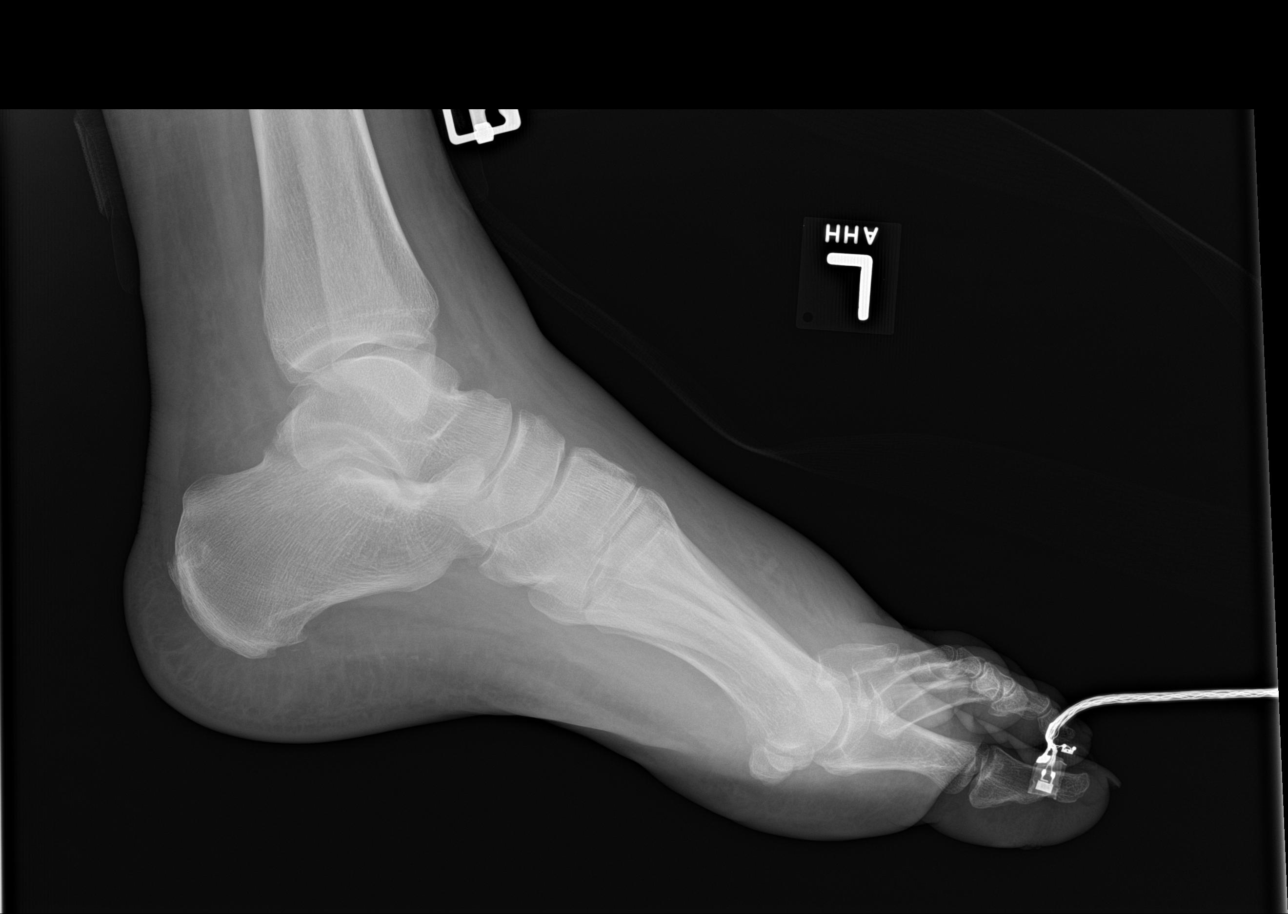

[4 of 4 positions shown; findings below may reference images not displayed]

FINDINGS: Mild generalized soft tissue swelling is seen. No acute fracture or
dislocation is noted.
IMPRESSION: Generalized soft tissue swelling without acute bony abnormality.

## 2019-03-08 IMAGING — CR DG CHEST 2V
2 series · 2 of 2 positions shown · non-contrast
Comparison: Chest radiograph 01/06/2018

CLINICAL DATA: Fall in the shower 3 days ago striking left side on
tub. Left lower chest pain. Shortness of breath.

EXAM:
CHEST - 2 VIEW

[w chest lat]
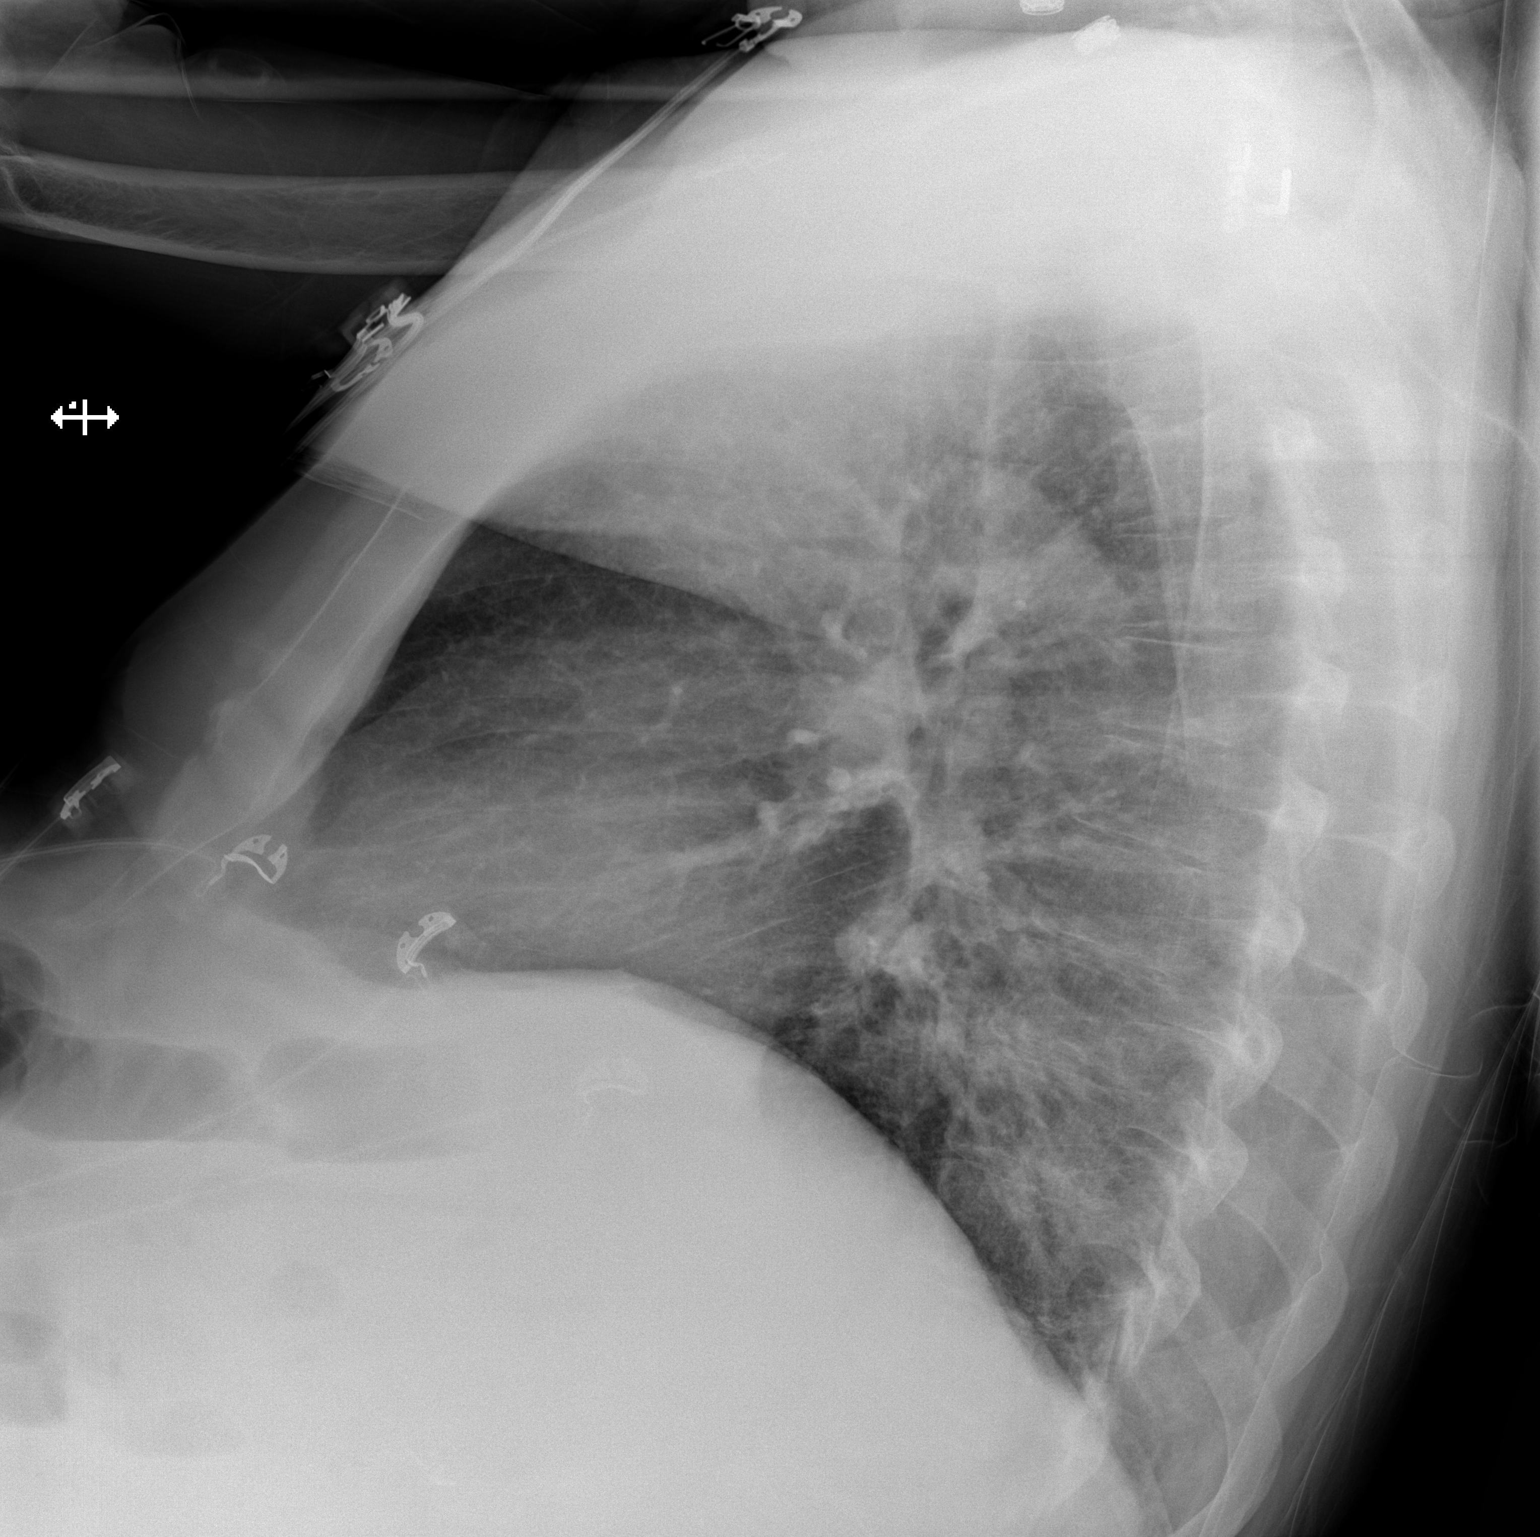

[x chest ap]
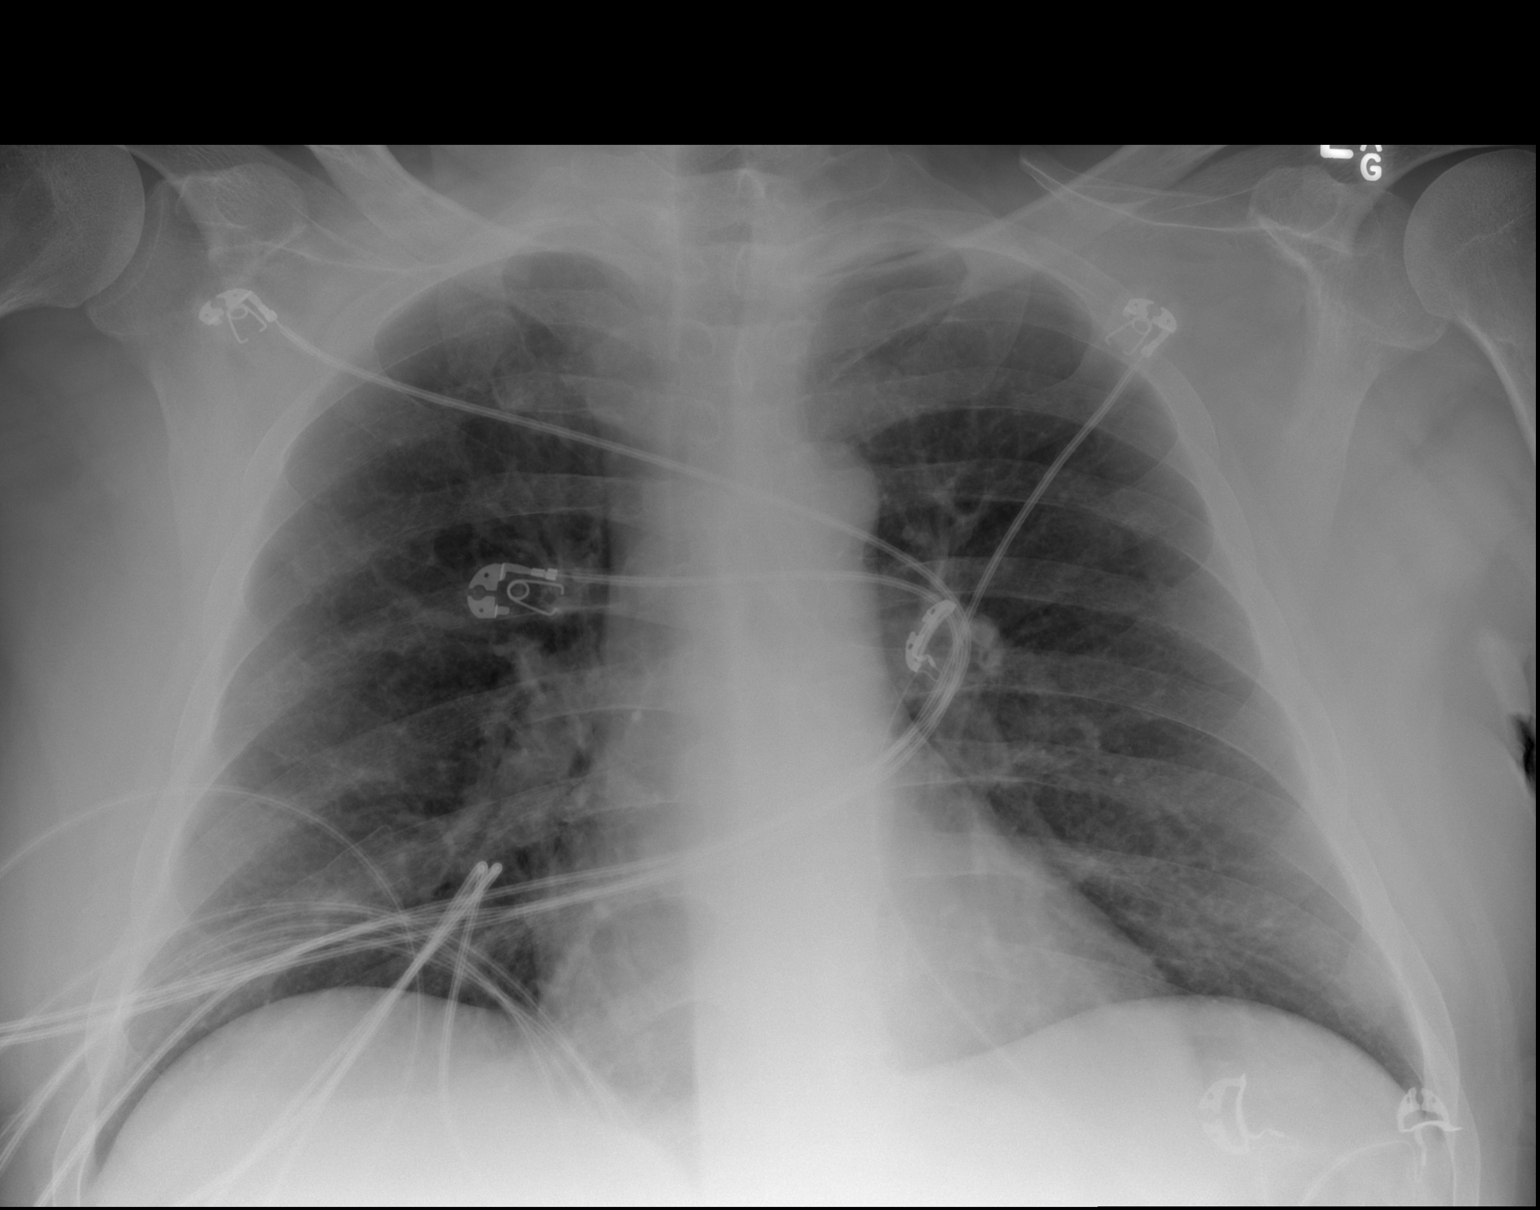

[2 of 2 positions shown; findings below may reference images not displayed]

FINDINGS: The cardiomediastinal contours are normal. The lungs are clear.
Pulmonary vasculature is normal. No consolidation, pleural effusion,
or pneumothorax. No visualized rib fracture.
IMPRESSION: No acute findings.  No visualized rib fracture.

## 2019-10-02 ENCOUNTER — Other Ambulatory Visit (HOSPITAL_COMMUNITY): Payer: Self-pay | Admitting: Internal Medicine

## 2019-10-02 ENCOUNTER — Other Ambulatory Visit: Payer: Self-pay | Admitting: Internal Medicine

## 2019-10-02 DIAGNOSIS — M47816 Spondylosis without myelopathy or radiculopathy, lumbar region: Secondary | ICD-10-CM

## 2019-10-02 DIAGNOSIS — G8929 Other chronic pain: Secondary | ICD-10-CM

## 2019-10-11 ENCOUNTER — Other Ambulatory Visit: Payer: Self-pay

## 2019-10-11 ENCOUNTER — Ambulatory Visit
Admission: RE | Admit: 2019-10-11 | Discharge: 2019-10-11 | Disposition: A | Discharge status: Home / Self Care | Payer: Medicare Other | Source: Ambulatory Visit | Attending: Internal Medicine | Admitting: Internal Medicine

## 2019-10-11 DIAGNOSIS — G8929 Other chronic pain: Secondary | ICD-10-CM | POA: Insufficient documentation

## 2019-10-11 DIAGNOSIS — M5441 Lumbago with sciatica, right side: Secondary | ICD-10-CM | POA: Diagnosis not present

## 2019-10-11 DIAGNOSIS — M47816 Spondylosis without myelopathy or radiculopathy, lumbar region: Secondary | ICD-10-CM | POA: Insufficient documentation

## 2021-04-21 ENCOUNTER — Emergency Department (HOSPITAL_COMMUNITY): Payer: Medicare HMO

## 2021-04-21 ENCOUNTER — Emergency Department (HOSPITAL_COMMUNITY): Payer: Medicare HMO | Admitting: Anesthesiology

## 2021-04-21 ENCOUNTER — Inpatient Hospital Stay (HOSPITAL_COMMUNITY): Payer: Medicare HMO

## 2021-04-21 ENCOUNTER — Other Ambulatory Visit: Payer: Self-pay

## 2021-04-21 ENCOUNTER — Encounter (HOSPITAL_COMMUNITY): Payer: Self-pay | Admitting: Pharmacy Technician

## 2021-04-21 ENCOUNTER — Encounter (HOSPITAL_COMMUNITY)
Admission: EM | Disposition: A | Discharge status: Home / Self Care | Payer: Self-pay | Source: Home / Self Care | Attending: Internal Medicine

## 2021-04-21 ENCOUNTER — Inpatient Hospital Stay (HOSPITAL_COMMUNITY)
Admission: EM | Admit: 2021-04-21 | Discharge: 2021-04-23 | DRG: 522 | Disposition: A | Discharge status: Home / Self Care | Payer: Medicare HMO | Attending: Internal Medicine | Admitting: Internal Medicine

## 2021-04-21 DIAGNOSIS — D696 Thrombocytopenia, unspecified: Secondary | ICD-10-CM | POA: Diagnosis not present

## 2021-04-21 DIAGNOSIS — M80052A Age-related osteoporosis with current pathological fracture, left femur, initial encounter for fracture: Principal | ICD-10-CM | POA: Diagnosis present

## 2021-04-21 DIAGNOSIS — Z881 Allergy status to other antibiotic agents status: Secondary | ICD-10-CM | POA: Diagnosis not present

## 2021-04-21 DIAGNOSIS — E119 Type 2 diabetes mellitus without complications: Secondary | ICD-10-CM | POA: Diagnosis present

## 2021-04-21 DIAGNOSIS — S72009A Fracture of unspecified part of neck of unspecified femur, initial encounter for closed fracture: Secondary | ICD-10-CM | POA: Diagnosis present

## 2021-04-21 DIAGNOSIS — S72002A Fracture of unspecified part of neck of left femur, initial encounter for closed fracture: Secondary | ICD-10-CM | POA: Diagnosis present

## 2021-04-21 DIAGNOSIS — K219 Gastro-esophageal reflux disease without esophagitis: Secondary | ICD-10-CM | POA: Diagnosis present

## 2021-04-21 DIAGNOSIS — K7469 Other cirrhosis of liver: Secondary | ICD-10-CM | POA: Diagnosis not present

## 2021-04-21 DIAGNOSIS — L4050 Arthropathic psoriasis, unspecified: Secondary | ICD-10-CM | POA: Diagnosis present

## 2021-04-21 DIAGNOSIS — E785 Hyperlipidemia, unspecified: Secondary | ICD-10-CM | POA: Diagnosis present

## 2021-04-21 DIAGNOSIS — K7030 Alcoholic cirrhosis of liver without ascites: Secondary | ICD-10-CM | POA: Diagnosis present

## 2021-04-21 DIAGNOSIS — Z419 Encounter for procedure for purposes other than remedying health state, unspecified: Secondary | ICD-10-CM

## 2021-04-21 DIAGNOSIS — Z888 Allergy status to other drugs, medicaments and biological substances status: Secondary | ICD-10-CM | POA: Diagnosis not present

## 2021-04-21 DIAGNOSIS — Z20822 Contact with and (suspected) exposure to covid-19: Secondary | ICD-10-CM | POA: Diagnosis present

## 2021-04-21 DIAGNOSIS — D6959 Other secondary thrombocytopenia: Secondary | ICD-10-CM | POA: Diagnosis present

## 2021-04-21 DIAGNOSIS — I10 Essential (primary) hypertension: Secondary | ICD-10-CM | POA: Diagnosis present

## 2021-04-21 DIAGNOSIS — W010XXA Fall on same level from slipping, tripping and stumbling without subsequent striking against object, initial encounter: Secondary | ICD-10-CM | POA: Diagnosis present

## 2021-04-21 DIAGNOSIS — F17210 Nicotine dependence, cigarettes, uncomplicated: Secondary | ICD-10-CM | POA: Diagnosis present

## 2021-04-21 DIAGNOSIS — W19XXXA Unspecified fall, initial encounter: Secondary | ICD-10-CM

## 2021-04-21 HISTORY — PX: TOTAL HIP ARTHROPLASTY: SHX124

## 2021-04-21 HISTORY — DX: Type 2 diabetes mellitus without complications: E11.9

## 2021-04-21 LAB — PHOSPHORUS: Phosphorus: 3.5 mg/dL (ref 2.5–4.6)

## 2021-04-21 LAB — CBC WITH DIFFERENTIAL/PLATELET
Abs Immature Granulocytes: 0.02 10*3/uL (ref 0.00–0.07)
Basophils Absolute: 0 10*3/uL (ref 0.0–0.1)
Basophils Relative: 1 %
Eosinophils Absolute: 0.1 10*3/uL (ref 0.0–0.5)
Eosinophils Relative: 1 %
HCT: 41.6 % (ref 39.0–52.0)
Hemoglobin: 14.1 g/dL (ref 13.0–17.0)
Immature Granulocytes: 0 %
Lymphocytes Relative: 14 %
Lymphs Abs: 0.9 10*3/uL (ref 0.7–4.0)
MCH: 32.4 pg (ref 26.0–34.0)
MCHC: 33.9 g/dL (ref 30.0–36.0)
MCV: 95.6 fL (ref 80.0–100.0)
Monocytes Absolute: 0.7 10*3/uL (ref 0.1–1.0)
Monocytes Relative: 12 %
Neutro Abs: 4.4 10*3/uL (ref 1.7–7.7)
Neutrophils Relative %: 72 %
Platelets: 72 10*3/uL — ABNORMAL LOW (ref 150–400)
RBC: 4.35 MIL/uL (ref 4.22–5.81)
RDW: 12.9 % (ref 11.5–15.5)
WBC: 6.1 10*3/uL (ref 4.0–10.5)
nRBC: 0 % (ref 0.0–0.2)

## 2021-04-21 LAB — BASIC METABOLIC PANEL
Anion gap: 8 (ref 5–15)
BUN: 12 mg/dL (ref 8–23)
CO2: 24 mmol/L (ref 22–32)
Calcium: 8.7 mg/dL — ABNORMAL LOW (ref 8.9–10.3)
Chloride: 104 mmol/L (ref 98–111)
Creatinine, Ser: 1.2 mg/dL (ref 0.61–1.24)
GFR, Estimated: >60 mL/min (ref >60)
Glucose, Bld: 128 mg/dL — ABNORMAL HIGH (ref 70–99)
Potassium: 4 mmol/L (ref 3.5–5.1)
Sodium: 136 mmol/L (ref 135–145)

## 2021-04-21 LAB — TECHNOLOGIST SMEAR REVIEW: Plt Morphology: NORMAL

## 2021-04-21 LAB — HEPATIC FUNCTION PANEL
ALT: 14 U/L (ref 0–44)
AST: 21 U/L (ref 15–41)
Albumin: 3.4 g/dL — ABNORMAL LOW (ref 3.5–5.0)
Alkaline Phosphatase: 61 U/L (ref 38–126)
Bilirubin, Direct: 0.3 mg/dL — ABNORMAL HIGH (ref 0.0–0.2)
Indirect Bilirubin: 1.2 mg/dL — ABNORMAL HIGH (ref 0.3–0.9)
Total Bilirubin: 1.5 mg/dL — ABNORMAL HIGH (ref 0.3–1.2)
Total Protein: 6.2 g/dL — ABNORMAL LOW (ref 6.5–8.1)

## 2021-04-21 LAB — RETICULOCYTES
Immature Retic Fract: 6.7 % (ref 2.3–15.9)
RBC.: 3.87 MIL/uL — ABNORMAL LOW (ref 4.22–5.81)
Retic Count, Absolute: 55.3 10*3/uL (ref 19.0–186.0)
Retic Ct Pct: 1.4 % (ref 0.4–3.1)

## 2021-04-21 LAB — MAGNESIUM: Magnesium: 1.6 mg/dL — ABNORMAL LOW (ref 1.7–2.4)

## 2021-04-21 LAB — HIV ANTIBODY (ROUTINE TESTING W REFLEX): HIV Screen 4th Generation wRfx: NONREACTIVE

## 2021-04-21 LAB — CBC
HCT: 36.2 % — ABNORMAL LOW (ref 39.0–52.0)
Hemoglobin: 12.4 g/dL — ABNORMAL LOW (ref 13.0–17.0)
MCH: 32.1 pg (ref 26.0–34.0)
MCHC: 34.3 g/dL (ref 30.0–36.0)
MCV: 93.8 fL (ref 80.0–100.0)
Platelets: 73 10*3/uL — ABNORMAL LOW (ref 150–400)
RBC: 3.86 MIL/uL — ABNORMAL LOW (ref 4.22–5.81)
RDW: 13.2 % (ref 11.5–15.5)
WBC: 8.1 10*3/uL (ref 4.0–10.5)
nRBC: 0 % (ref 0.0–0.2)

## 2021-04-21 LAB — APTT: aPTT: 34 seconds (ref 24–36)

## 2021-04-21 LAB — RESP PANEL BY RT-PCR (FLU A&B, COVID) ARPGX2
Influenza A by PCR: NEGATIVE
Influenza B by PCR: NEGATIVE
SARS Coronavirus 2 by RT PCR: NEGATIVE

## 2021-04-21 LAB — GLUCOSE, CAPILLARY: Glucose-Capillary: 83 mg/dL (ref 70–99)

## 2021-04-21 LAB — PROTIME-INR
INR: 1.3 — ABNORMAL HIGH (ref 0.8–1.2)
Prothrombin Time: 16.6 seconds — ABNORMAL HIGH (ref 11.4–15.2)

## 2021-04-21 LAB — CREATININE, SERUM
Creatinine, Ser: 1.28 mg/dL — ABNORMAL HIGH (ref 0.61–1.24)
GFR, Estimated: >60 mL/min (ref >60)

## 2021-04-21 SURGERY — ARTHROPLASTY, HIP, TOTAL, ANTERIOR APPROACH
Anesthesia: General | Site: Hip | Laterality: Left

## 2021-04-21 MED ORDER — DIPHENHYDRAMINE HCL 12.5 MG/5ML PO ELIX: 12.5000 mg | ORAL_SOLUTION | ORAL | Status: DC | PRN

## 2021-04-21 MED ORDER — ACETAMINOPHEN 500 MG PO TABS: 1000.0000 mg | ORAL_TABLET | Freq: Once | ORAL | Status: DC

## 2021-04-21 MED ORDER — DOCUSATE SODIUM 100 MG PO CAPS
100.0000 mg | ORAL_CAPSULE | Freq: Two times a day (BID) | ORAL | Status: DC
  Administered 2021-04-21 – 2021-04-23 (×3): 100 mg via ORAL
  Filled 2021-04-21 (×4): qty 1

## 2021-04-21 MED ORDER — ACETAMINOPHEN 325 MG PO TABS
325.0000 mg | ORAL_TABLET | Freq: Four times a day (QID) | ORAL | Status: DC | PRN
Start: 2021-04-22 — End: 2021-04-23

## 2021-04-21 MED ORDER — CEFAZOLIN SODIUM-DEXTROSE 2-4 GM/100ML-% IV SOLN
2.0000 g | Freq: Four times a day (QID) | INTRAVENOUS | Status: AC
  Administered 2021-04-22 (×2): 2 g via INTRAVENOUS
  Filled 2021-04-21 (×2): qty 100

## 2021-04-21 MED ORDER — HYDROMORPHONE HCL 1 MG/ML IJ SOLN: 0.5000 mg | INTRAMUSCULAR | Status: DC | PRN

## 2021-04-21 MED ORDER — MIDAZOLAM HCL 2 MG/2ML IJ SOLN
INTRAMUSCULAR | Status: AC
  Filled 2021-04-21: qty 2

## 2021-04-21 MED ORDER — HYDROMORPHONE HCL 1 MG/ML IJ SOLN
0.5000 mg | INTRAMUSCULAR | Status: DC | PRN
  Administered 2021-04-21: 1 mg via INTRAVENOUS
  Administered 2021-04-22: 0.5 mg via INTRAVENOUS
  Filled 2021-04-21 (×2): qty 1

## 2021-04-21 MED ORDER — CEFAZOLIN SODIUM-DEXTROSE 2-4 GM/100ML-% IV SOLN
INTRAVENOUS | Status: AC
  Filled 2021-04-21: qty 100

## 2021-04-21 MED ORDER — LIDOCAINE 2% (20 MG/ML) 5 ML SYRINGE
INTRAMUSCULAR | Status: DC | PRN
  Administered 2021-04-21: 80 mg via INTRAVENOUS

## 2021-04-21 MED ORDER — PHENYLEPHRINE HCL-NACL 10-0.9 MG/250ML-% IV SOLN
INTRAVENOUS | Status: DC | PRN
  Administered 2021-04-21: 25 ug/min via INTRAVENOUS

## 2021-04-21 MED ORDER — ONDANSETRON HCL 4 MG/2ML IJ SOLN
INTRAMUSCULAR | Status: DC | PRN
  Administered 2021-04-21: 4 mg via INTRAVENOUS

## 2021-04-21 MED ORDER — TRAMADOL HCL 50 MG PO TABS
50.0000 mg | ORAL_TABLET | Freq: Four times a day (QID) | ORAL | Status: DC
  Administered 2021-04-21 – 2021-04-23 (×9): 50 mg via ORAL
  Filled 2021-04-21 (×9): qty 1

## 2021-04-21 MED ORDER — METHOCARBAMOL 500 MG PO TABS
500.0000 mg | ORAL_TABLET | Freq: Four times a day (QID) | ORAL | Status: DC | PRN
  Administered 2021-04-22 – 2021-04-23 (×5): 500 mg via ORAL
  Filled 2021-04-21 (×5): qty 1

## 2021-04-21 MED ORDER — OXYCODONE HCL 5 MG PO TABS
10.0000 mg | ORAL_TABLET | ORAL | Status: DC | PRN
  Administered 2021-04-22: 10 mg via ORAL
  Filled 2021-04-21: qty 2

## 2021-04-21 MED ORDER — HYDROMORPHONE HCL 1 MG/ML IJ SOLN
1.0000 mg | Freq: Once | INTRAMUSCULAR | Status: AC | PRN
  Administered 2021-04-21: 1 mg via INTRAVENOUS
  Filled 2021-04-21: qty 1

## 2021-04-21 MED ORDER — ONDANSETRON HCL 4 MG/2ML IJ SOLN: 4.0000 mg | Freq: Four times a day (QID) | INTRAMUSCULAR | Status: DC | PRN

## 2021-04-21 MED ORDER — FENTANYL CITRATE (PF) 250 MCG/5ML IJ SOLN
INTRAMUSCULAR | Status: DC | PRN
  Administered 2021-04-21: 50 ug via INTRAVENOUS
  Administered 2021-04-21: 100 ug via INTRAVENOUS

## 2021-04-21 MED ORDER — MENTHOL 3 MG MT LOZG: 1.0000 | LOZENGE | OROMUCOSAL | Status: DC | PRN

## 2021-04-21 MED ORDER — LACTATED RINGERS IV SOLN: INTRAVENOUS | Status: DC

## 2021-04-21 MED ORDER — POLYETHYLENE GLYCOL 3350 17 G PO PACK: 17.0000 g | PACK | Freq: Every day | ORAL | Status: DC | PRN

## 2021-04-21 MED ORDER — SODIUM CHLORIDE 0.9% FLUSH
3.0000 mL | Freq: Two times a day (BID) | INTRAVENOUS | Status: DC
  Administered 2021-04-21 – 2021-04-23 (×3): 3 mL via INTRAVENOUS

## 2021-04-21 MED ORDER — CHLORHEXIDINE GLUCONATE 0.12 % MT SOLN
OROMUCOSAL | Status: AC
  Administered 2021-04-21: 15 mL via OROMUCOSAL
  Filled 2021-04-21: qty 15

## 2021-04-21 MED ORDER — CHLORHEXIDINE GLUCONATE 0.12 % MT SOLN: 15.0000 mL | Freq: Once | OROMUCOSAL | Status: AC

## 2021-04-21 MED ORDER — ACETAMINOPHEN 325 MG PO TABS: 650.0000 mg | ORAL_TABLET | Freq: Four times a day (QID) | ORAL | Status: DC | PRN

## 2021-04-21 MED ORDER — KETOROLAC TROMETHAMINE 30 MG/ML IJ SOLN
30.0000 mg | Freq: Once | INTRAMUSCULAR | Status: AC
  Administered 2021-04-21: 30 mg via INTRAVENOUS
  Filled 2021-04-21: qty 1

## 2021-04-21 MED ORDER — PHENYLEPHRINE 40 MCG/ML (10ML) SYRINGE FOR IV PUSH (FOR BLOOD PRESSURE SUPPORT)
PREFILLED_SYRINGE | INTRAVENOUS | Status: DC | PRN
  Administered 2021-04-21: 80 ug via INTRAVENOUS
  Administered 2021-04-21: 40 ug via INTRAVENOUS
  Administered 2021-04-21: 80 ug via INTRAVENOUS
  Administered 2021-04-21: 40 ug via INTRAVENOUS

## 2021-04-21 MED ORDER — ENOXAPARIN SODIUM 40 MG/0.4ML IJ SOSY
40.0000 mg | PREFILLED_SYRINGE | INTRAMUSCULAR | Status: DC
  Administered 2021-04-22 – 2021-04-23 (×2): 40 mg via SUBCUTANEOUS
  Filled 2021-04-21 (×2): qty 0.4

## 2021-04-21 MED ORDER — PANTOPRAZOLE SODIUM 40 MG PO TBEC
40.0000 mg | DELAYED_RELEASE_TABLET | Freq: Every day | ORAL | Status: DC
  Administered 2021-04-21 – 2021-04-23 (×3): 40 mg via ORAL
  Filled 2021-04-21 (×3): qty 1

## 2021-04-21 MED ORDER — EPHEDRINE SULFATE-NACL 50-0.9 MG/10ML-% IV SOSY
PREFILLED_SYRINGE | INTRAVENOUS | Status: DC | PRN
  Administered 2021-04-21: 10 mg via INTRAVENOUS

## 2021-04-21 MED ORDER — DEXAMETHASONE SODIUM PHOSPHATE 10 MG/ML IJ SOLN: 8.0000 mg | Freq: Once | INTRAMUSCULAR | Status: DC

## 2021-04-21 MED ORDER — BUPIVACAINE LIPOSOME 1.3 % IJ SUSP
INTRAMUSCULAR | Status: DC | PRN
  Administered 2021-04-21: 20 mL

## 2021-04-21 MED ORDER — SUGAMMADEX SODIUM 200 MG/2ML IV SOLN
INTRAVENOUS | Status: DC | PRN
  Administered 2021-04-21: 200 mg via INTRAVENOUS

## 2021-04-21 MED ORDER — 0.9 % SODIUM CHLORIDE (POUR BTL) OPTIME
TOPICAL | Status: DC | PRN
  Administered 2021-04-21: 1000 mL

## 2021-04-21 MED ORDER — ACETAMINOPHEN 650 MG RE SUPP
650.0000 mg | Freq: Four times a day (QID) | RECTAL | Status: DC | PRN
  Filled 2021-04-21: qty 1

## 2021-04-21 MED ORDER — FENTANYL CITRATE (PF) 250 MCG/5ML IJ SOLN
INTRAMUSCULAR | Status: AC
  Filled 2021-04-21: qty 5

## 2021-04-21 MED ORDER — ALBUMIN HUMAN 5 % IV SOLN
INTRAVENOUS | Status: DC | PRN
Start: 2021-04-21 — End: 2021-04-21

## 2021-04-21 MED ORDER — ONDANSETRON HCL 4 MG PO TABS: 4.0000 mg | ORAL_TABLET | Freq: Four times a day (QID) | ORAL | Status: DC | PRN

## 2021-04-21 MED ORDER — METOCLOPRAMIDE HCL 5 MG/ML IJ SOLN: 5.0000 mg | Freq: Three times a day (TID) | INTRAMUSCULAR | Status: DC | PRN

## 2021-04-21 MED ORDER — PREGABALIN 50 MG PO CAPS
50.0000 mg | ORAL_CAPSULE | Freq: Three times a day (TID) | ORAL | Status: DC
  Administered 2021-04-21 – 2021-04-23 (×6): 50 mg via ORAL
  Filled 2021-04-21 (×6): qty 1

## 2021-04-21 MED ORDER — BUPIVACAINE LIPOSOME 1.3 % IJ SUSP
INTRAMUSCULAR | Status: AC
  Filled 2021-04-21: qty 20

## 2021-04-21 MED ORDER — BISACODYL 10 MG RE SUPP: 10.0000 mg | Freq: Every day | RECTAL | Status: DC | PRN

## 2021-04-21 MED ORDER — ACETAMINOPHEN 500 MG PO TABS
1000.0000 mg | ORAL_TABLET | Freq: Four times a day (QID) | ORAL | Status: AC
  Administered 2021-04-21 – 2021-04-22 (×4): 1000 mg via ORAL
  Filled 2021-04-21 (×4): qty 2

## 2021-04-21 MED ORDER — DEXAMETHASONE SODIUM PHOSPHATE 10 MG/ML IJ SOLN
10.0000 mg | Freq: Once | INTRAMUSCULAR | Status: AC
  Administered 2021-04-22: 10 mg via INTRAVENOUS
  Filled 2021-04-21: qty 1

## 2021-04-21 MED ORDER — ENOXAPARIN SODIUM 40 MG/0.4ML IJ SOSY: 40.0000 mg | PREFILLED_SYRINGE | INTRAMUSCULAR | Status: DC

## 2021-04-21 MED ORDER — ROCURONIUM BROMIDE 10 MG/ML (PF) SYRINGE
PREFILLED_SYRINGE | INTRAVENOUS | Status: DC | PRN
  Administered 2021-04-21: 40 mg via INTRAVENOUS
  Administered 2021-04-21: 20 mg via INTRAVENOUS
  Administered 2021-04-21: 60 mg via INTRAVENOUS

## 2021-04-21 MED ORDER — METHOCARBAMOL 1000 MG/10ML IJ SOLN
500.0000 mg | Freq: Four times a day (QID) | INTRAVENOUS | Status: DC | PRN
  Filled 2021-04-21: qty 5

## 2021-04-21 MED ORDER — MAGNESIUM CITRATE PO SOLN
1.0000 | Freq: Once | ORAL | Status: DC | PRN
  Filled 2021-04-21: qty 296

## 2021-04-21 MED ORDER — ALUM & MAG HYDROXIDE-SIMETH 200-200-20 MG/5ML PO SUSP: 30.0000 mL | ORAL | Status: DC | PRN

## 2021-04-21 MED ORDER — PROPOFOL 10 MG/ML IV BOLUS
INTRAVENOUS | Status: DC | PRN
  Administered 2021-04-21: 150 mg via INTRAVENOUS

## 2021-04-21 MED ORDER — ORAL CARE MOUTH RINSE: 15.0000 mL | Freq: Once | OROMUCOSAL | Status: AC

## 2021-04-21 MED ORDER — TRANEXAMIC ACID-NACL 1000-0.7 MG/100ML-% IV SOLN
INTRAVENOUS | Status: AC
  Filled 2021-04-21: qty 100

## 2021-04-21 MED ORDER — TRANEXAMIC ACID-NACL 1000-0.7 MG/100ML-% IV SOLN
1000.0000 mg | INTRAVENOUS | Status: AC
  Administered 2021-04-21: 1000 mg via INTRAVENOUS

## 2021-04-21 MED ORDER — CEFAZOLIN SODIUM-DEXTROSE 2-4 GM/100ML-% IV SOLN
2.0000 g | INTRAVENOUS | Status: AC
  Administered 2021-04-21: 2 g via INTRAVENOUS

## 2021-04-21 MED ORDER — OXYCODONE HCL 5 MG PO TABS
5.0000 mg | ORAL_TABLET | ORAL | Status: DC | PRN
  Administered 2021-04-22 – 2021-04-23 (×5): 10 mg via ORAL
  Filled 2021-04-21 (×5): qty 2

## 2021-04-21 MED ORDER — PHENOL 1.4 % MT LIQD: 1.0000 | OROMUCOSAL | Status: DC | PRN

## 2021-04-21 MED ORDER — SODIUM CHLORIDE FLUSH 0.9 % IV SOLN
INTRAVENOUS | Status: DC | PRN
  Administered 2021-04-21 (×3): 10 mL

## 2021-04-21 MED ORDER — TRANEXAMIC ACID-NACL 1000-0.7 MG/100ML-% IV SOLN
1000.0000 mg | Freq: Once | INTRAVENOUS | Status: AC
  Administered 2021-04-21: 1000 mg via INTRAVENOUS
  Filled 2021-04-21: qty 100

## 2021-04-21 MED ORDER — ACETAMINOPHEN 500 MG PO TABS
1000.0000 mg | ORAL_TABLET | Freq: Four times a day (QID) | ORAL | Status: DC | PRN
  Filled 2021-04-21: qty 2

## 2021-04-21 MED ORDER — POVIDONE-IODINE 10 % EX SWAB
2.0000 "application " | Freq: Once | CUTANEOUS | Status: AC
  Administered 2021-04-21: 2 via TOPICAL

## 2021-04-21 MED ORDER — METOCLOPRAMIDE HCL 5 MG PO TABS: 5.0000 mg | ORAL_TABLET | Freq: Three times a day (TID) | ORAL | Status: DC | PRN

## 2021-04-21 MED ORDER — HYDROMORPHONE HCL 1 MG/ML IJ SOLN
0.5000 mg | Freq: Once | INTRAMUSCULAR | Status: AC
Start: 2021-04-21 — End: 2021-04-21
  Administered 2021-04-21: 0.5 mg via INTRAVENOUS
  Filled 2021-04-21: qty 1

## 2021-04-21 MED ORDER — MIDAZOLAM HCL 2 MG/2ML IJ SOLN
INTRAMUSCULAR | Status: DC | PRN
  Administered 2021-04-21: 2 mg via INTRAVENOUS

## 2021-04-21 MED ORDER — SODIUM CHLORIDE FLUSH 0.9 % IV SOLN
INTRAVENOUS | Status: AC
  Filled 2021-04-21: qty 30

## 2021-04-21 SURGICAL SUPPLY — 59 items
BAG COUNTER SPONGE SURGICOUNT (BAG) ×2 IMPLANT
BLADE SAG 18X100X1.27 (BLADE) IMPLANT
CLSR STERI-STRIP ANTIMIC 1/2X4 (GAUZE/BANDAGES/DRESSINGS) ×2 IMPLANT
COVER PERINEAL POST (MISCELLANEOUS) ×2 IMPLANT
COVER SURGICAL LIGHT HANDLE (MISCELLANEOUS) ×2 IMPLANT
DRAPE C-ARM 42X72 X-RAY (DRAPES) ×2 IMPLANT
DRAPE STERI IOBAN 125X83 (DRAPES) ×2 IMPLANT
DRAPE U-SHAPE 47X51 STRL (DRAPES) IMPLANT
DRSG MEPILEX BORDER 4X8 (GAUZE/BANDAGES/DRESSINGS) ×2 IMPLANT
DURAPREP 26ML APPLICATOR (WOUND CARE) ×2 IMPLANT
ELECT BLADE 4.0 EZ CLEAN MEGAD (MISCELLANEOUS) ×2
ELECT REM PT RETURN 9FT ADLT (ELECTROSURGICAL) ×2
ELECTRODE BLDE 4.0 EZ CLN MEGD (MISCELLANEOUS) ×1 IMPLANT
ELECTRODE REM PT RTRN 9FT ADLT (ELECTROSURGICAL) ×1 IMPLANT
FACESHIELD WRAPAROUND (MASK) ×4 IMPLANT
GLOVE SRG 8 PF TXTR STRL LF DI (GLOVE) ×1 IMPLANT
GLOVE SURG ENC MOIS LTX SZ7.5 (GLOVE) ×2 IMPLANT
GLOVE SURG SYN 7.5  E (GLOVE) ×2
GLOVE SURG SYN 7.5 E (GLOVE) ×1 IMPLANT
GLOVE SURG UNDER POLY LF SZ7.5 (GLOVE) ×2 IMPLANT
GLOVE SURG UNDER POLY LF SZ8 (GLOVE) ×2
GOWN STRL REUS W/ TWL LRG LVL3 (GOWN DISPOSABLE) ×2 IMPLANT
GOWN STRL REUS W/ TWL XL LVL3 (GOWN DISPOSABLE) ×1 IMPLANT
GOWN STRL REUS W/TWL LRG LVL3 (GOWN DISPOSABLE) ×4
GOWN STRL REUS W/TWL XL LVL3 (GOWN DISPOSABLE) ×2
HEAD BIOLOX HIP 36/+2.5 (Joint) ×1 IMPLANT
HIP BIOLOX HD 36/+2.5 (Joint) ×2 IMPLANT
INSERT TRIDENT POLY 36 0DEG (Insert) ×2 IMPLANT
KIT BASIN OR (CUSTOM PROCEDURE TRAY) ×2 IMPLANT
KIT TURNOVER KIT B (KITS) ×2 IMPLANT
MANIFOLD NEPTUNE II (INSTRUMENTS) ×2 IMPLANT
NEEDLE 18GX1X1/2 (RX/OR ONLY) (NEEDLE) IMPLANT
NEEDLE HYPO 22GX1.5 SAFETY (NEEDLE) ×2 IMPLANT
NS IRRIG 1000ML POUR BTL (IV SOLUTION) ×2 IMPLANT
PACK TOTAL JOINT (CUSTOM PROCEDURE TRAY) ×2 IMPLANT
PAD ARMBOARD 7.5X6 YLW CONV (MISCELLANEOUS) ×2 IMPLANT
SCREW HEX LP 6.5X20 (Screw) ×2 IMPLANT
SHELL CLUSTERHOLE ACETABULAR 5 (Shell) ×2 IMPLANT
SPONGE T-LAP 18X18 ~~LOC~~+RFID (SPONGE) ×6 IMPLANT
STEM 37MM HIP (Hips) ×2 IMPLANT
SUT MNCRL AB 4-0 PS2 18 (SUTURE) IMPLANT
SUT MON AB 3-0 SH 27 (SUTURE) ×2
SUT MON AB 3-0 SH27 (SUTURE) ×1 IMPLANT
SUT STRATAFIX 1PDS 45CM VIOLET (SUTURE) ×2 IMPLANT
SUT VIC AB 0 CT1 27 (SUTURE) ×2
SUT VIC AB 0 CT1 27XBRD ANBCTR (SUTURE) ×1 IMPLANT
SUT VIC AB 1 CT1 27 (SUTURE) ×4
SUT VIC AB 1 CT1 27XBRD ANBCTR (SUTURE) ×2 IMPLANT
SUT VIC AB 2-0 CT1 27 (SUTURE) ×2
SUT VIC AB 2-0 CT1 TAPERPNT 27 (SUTURE) ×1 IMPLANT
SYR 20ML LL LF (SYRINGE) IMPLANT
SYR 50ML LL SCALE MARK (SYRINGE) ×2 IMPLANT
SYR BULB IRRIG 60ML STRL (SYRINGE) ×2 IMPLANT
TOWEL GREEN STERILE (TOWEL DISPOSABLE) ×2 IMPLANT
TOWEL GREEN STERILE FF (TOWEL DISPOSABLE) ×2 IMPLANT
TRAY CATH 16FR W/PLASTIC CATH (SET/KITS/TRAYS/PACK) IMPLANT
TRAY FOLEY MTR SLVR 16FR STAT (SET/KITS/TRAYS/PACK) IMPLANT
WATER STERILE IRR 1000ML POUR (IV SOLUTION) ×2 IMPLANT
YANKAUER SUCT BULB TIP NO VENT (SUCTIONS) ×4 IMPLANT

## 2021-04-21 NOTE — Discharge Instructions (Addendum)
Dear Mr. Cory Foster, Cu were admitted to [facility] for a hip fracture. The orthopedic surgeon performed a joint replacement. Please see their recommendations below.  Due to the nature of your hip fracture, it is recommended that you begin Vitamin D supplementation. Please talk with your rheumatologist about starting Vitamin D. Please follow up with a primary care physician in 1 week. Please follow up with the orthopedic surgery clinic Pleas limit/eliminate tobacco use to help with surgical recovery.  Please follow up with primary care physician for possible EGD referral.  You may bear weight as tolerated. Keep your dressing on and dry until follow up. Take medicine to prevent blood clots as directed. Take pain medicine as needed with the goal of transitioning to over the counter medicines.    INSTRUCTIONS AFTER JOINT REPLACEMENT   Remove items at home which could result in a fall. This includes throw rugs or furniture in walking pathways ICE to the affected joint every three hours while awake for 30 minutes at a time, for at least the first 3-5 days, and then as needed for pain and swelling.  Continue to use ice for pain and swelling. You may notice swelling that will progress down to the foot and ankle.  This is normal after surgery.  Elevate your leg when you are not up walking on it.   Continue to use the breathing machine you got in the hospital (incentive spirometer) which will help keep your temperature down.  It is common for your temperature to cycle up and down following surgery, especially at night when you are not up moving around and exerting yourself.  The breathing machine keeps your lungs expanded and your temperature down.   DIET:  As you were doing prior to hospitalization, we recommend a well-balanced diet.  DRESSING / WOUND CARE / SHOWERING  You may shower 3 days after surgery, but keep the wounds dry during showering.  You may use an occlusive plastic  wrap (Press'n Seal for example) with blue painter's tape at edges to cover the wound. NO SOAKING/SUBMERGING IN THE BATHTUB.  If the bandage gets wet, call the office.  ACTIVITY  Increase activity slowly as tolerated, but follow the weight bearing instructions below.   No driving for 6 weeks or until further direction given by your physician.  You cannot drive while taking narcotics.  No lifting or carrying greater than 10 lbs. until further directed by your surgeon. Avoid periods of inactivity such as sitting longer than an hour when not asleep. This helps prevent blood clots.  You may return to work once you are authorized by your doctor.    WEIGHT BEARING   Weight bearing as tolerated with assist device (walker, cane, etc) as directed, use it as long as suggested by your surgeon or therapist, typically at least 4-6 weeks.   EXERCISES  Results after joint replacement surgery are often greatly improved when you follow the exercise, range of motion and muscle strengthening exercises prescribed by your doctor. Safety measures are also important to protect the joint from further injury. Any time any of these exercises cause you to have increased pain or swelling, decrease what you are doing until you are comfortable again and then slowly increase them. If you have problems or questions, call your caregiver or physical therapist for advice.   Rehabilitation is important following a joint replacement. After just a few days of immobilization, the muscles of the leg can become weakened and shrink (atrophy).  These exercises are designed to build up the tone and strength of the thigh and leg muscles and to improve motion. Often times heat used for twenty to thirty minutes before working out will loosen up your tissues and help with improving the range of motion but do not use heat for the first two weeks following surgery (sometimes heat can increase post-operative swelling).   These exercises can be  done on a training (exercise) mat, on the floor, on a table or on a bed. Use whatever works the best and is most comfortable for you.    Use music or television while you are exercising so that the exercises are a pleasant break in your day. This will make your life better with the exercises acting as a break in your routine that you can look forward to.   Perform all exercises about fifteen times, three times per day or as directed.  You should exercise both the operative leg and the other leg as well.  Exercises include:   Quad Sets - Tighten up the muscle on the front of the thigh (Quad) and hold for 5-10 seconds.   Straight Leg Raises - With your knee straight (if you were given a brace, keep it on), lift the leg to 60 degrees, hold for 3 seconds, and slowly lower the leg.  Perform this exercise against resistance later as your leg gets stronger.  Leg Slides: Lying on your back, slowly slide your foot toward your buttocks, bending your knee up off the floor (only go as far as is comfortable). Then slowly slide your foot back down until your leg is flat on the floor again.  Angel Wings: Lying on your back spread your legs to the side as far apart as you can without causing discomfort.  Hamstring Strength:  Lying on your back, push your heel against the floor with your leg straight by tightening up the muscles of your buttocks.  Repeat, but this time bend your knee to a comfortable angle, and push your heel against the floor.  You may put a pillow under the heel to make it more comfortable if necessary.   A rehabilitation program following joint replacement surgery can speed recovery and prevent re-injury in the future due to weakened muscles. Contact your doctor or a physical therapist for more information on knee rehabilitation.    CONSTIPATION  Constipation is defined medically as fewer than three stools per week and severe constipation as less than one stool per week.  Even if you have a regular  bowel pattern at home, your normal regimen is likely to be disrupted due to multiple reasons following surgery.  Combination of anesthesia, postoperative narcotics, change in appetite and fluid intake all can affect your bowels.   YOU MUST use at least one of the following options; they are listed in order of increasing strength to get the job done.  They are all available over the counter, and you may need to use some, POSSIBLY even all of these options:    Drink plenty of fluids (prune juice may be helpful) and high fiber foods Colace 100 mg by mouth twice a day  Senokot for constipation as directed and as needed Dulcolax (bisacodyl), take with full glass of water  Miralax (polyethylene glycol) once or twice a day as needed.  If you have tried all these things and are unable to have a bowel movement in the first 3-4 days after surgery call either your surgeon or your primary  doctor.    If you experience loose stools or diarrhea, hold the medications until you stool forms back up.  If your symptoms do not get better within 1 week or if they get worse, check with your doctor.  If you experience "the worst abdominal pain ever" or develop nausea or vomiting, please contact the office immediately for further recommendations for treatment.   ITCHING:  If you experience itching with your medications, try taking only a single pain pill, or even half a pain pill at a time.  You can also use Benadryl over the counter for itching or also to help with sleep.   TED HOSE STOCKINGS:  Use stockings on both legs until for at least 2 weeks or as directed by physician office. They may be removed at night for sleeping.  MEDICATIONS:  See your medication summary on the "After Visit Summary" that nursing will review with you.  You may have some home medications which will be placed on hold until you complete the course of blood thinner medication.  It is important for you to complete the blood thinner medication as  prescribed.  Take medicines as prescribed.   You have several different medicines that work in different ways. - Tylenol is for mild to moderate pain. Try to take this medicine before turning to your narcotic medicines.  - Meloxicam is to reduce pain / inflammation - Robaxin is for muscle spasms - Oxycodone is a narcotic pain medicine.  Take this for severe pain. This medicine can be dehydrating / constipating. - Zofran is for nausea and vomiting. - Omeprazole is for gastric protection while taking pain medicines.  - Aspirin is to prevent blood clots after surgery.   PRECAUTIONS:  If you experience chest pain or shortness of breath - call 911 immediately for transfer to the hospital emergency department.   If you develop a fever greater that 101 F, purulent drainage from wound, increased redness or drainage from wound, foul odor from the wound/dressing, or calf pain - CONTACT YOUR SURGEON.                                                   FOLLOW-UP APPOINTMENTS:  If you do not already have a post-op appointment, please call the office [phone] for an appointment to be seen by Dr. Eulah Pont in 2 weeks.   OTHER INSTRUCTIONS:   MAKE SURE YOU:  Understand these instructions.  Get help right away if you are not doing well or get worse.    Thank you for letting us be a part of your medical care team.  It is a privilege we respect greatly.  We hope these instructions will help you stay on track for a fast and full recovery!    POST-OPERATIVE OPIOID TAPER INSTRUCTIONS: It is important to wean off of your opioid medication as soon as possible. If you do not need pain medication after your surgery it is ok to stop day one. Opioids include: Codeine, Hydrocodone(Norco, Vicodin), Oxycodone(Percocet, oxycontin) and hydromorphone amongst others.  Long term and even short term use of opiods can cause: Increased pain response Dependence Constipation Depression Respiratory depression And more.   Withdrawal symptoms can include Flu like symptoms Nausea, vomiting And more Techniques to manage these symptoms Hydrate well Eat regular healthy meals Stay active Use relaxation techniques(deep breathing, meditating, yoga) Do  Not substitute Alcohol to help with tapering If you have been on opioids for less than two weeks and do not have pain than it is ok to stop all together.  Plan to wean off of opioids This plan should start within one week post op of your joint replacement. Maintain the same interval or time between taking each dose and first decrease the dose.  Cut the total daily intake of opioids by one tablet each day Next start to increase the time between doses. The last dose that should be eliminated is the evening dose.

## 2021-04-21 NOTE — Anesthesia Preprocedure Evaluation (Addendum)
Anesthesia Evaluation  Patient identified by MRN, date of birth, ID band Patient awake    Reviewed: Allergy & Precautions, NPO status , Patient's Chart, lab work & pertinent test results  Airway Mallampati: II  TM Distance: >3 FB Neck ROM: Full    Dental  (+) Dental Advisory Given, Poor Dentition, Missing, Chipped,    Pulmonary Current Smoker and Patient abstained from smoking.,    Pulmonary exam normal breath sounds clear to auscultation       Cardiovascular hypertension, Pt. on medications Normal cardiovascular exam Rhythm:Regular Rate:Normal     Neuro/Psych PSYCHIATRIC DISORDERS Anxiety Depression negative neurological ROS     GI/Hepatic GERD  ,(+)     substance abuse  alcohol use, Hx/o ETOH abuse quit 2-3 years ago   Endo/Other  diabetes, Poorly Controlled, Type 2Non compliant with meds  Renal/GU negative Renal ROS  negative genitourinary   Musculoskeletal  (+) Arthritis , Osteoarthritis,  Fx left femoral neck   Abdominal   Peds  Hematology Thrombocytopenia- ?etiology ? ETOH   Anesthesia Other Findings   Reproductive/Obstetrics                            Anesthesia Physical Anesthesia Plan  ASA: 2  Anesthesia Plan: General   Post-op Pain Management:    Induction: Intravenous  PONV Risk Score and Plan: 2 and Treatment may vary due to age or medical condition and Ondansetron  Airway Management Planned: Oral ETT  Additional Equipment:   Intra-op Plan:   Post-operative Plan: Extubation in OR  Informed Consent: I have reviewed the patients History and Physical, chart, labs and discussed the procedure including the risks, benefits and alternatives for the proposed anesthesia with the patient or authorized representative who has indicated his/her understanding and acceptance.     Dental advisory given  Plan Discussed with: CRNA and Anesthesiologist  Anesthesia Plan  Comments:         Anesthesia Quick Evaluation

## 2021-04-21 NOTE — Progress Notes (Deleted)
Patient to HD 

## 2021-04-21 NOTE — H&P (View-Only) (Signed)
ORTHOPAEDIC CONSULTATION  REQUESTING PHYSICIAN: Wyvonnia Dusky, MD  Chief Complaint: left hip pain after a fall  HPI: Cory Foster. is a 65 y.o. male who complains of left hip pain after falling outside at home when he tripped over a rock. He says he was helping some friends with yard work at 3 am this morning. He had immediate pain and was unable to bear weight on the left leg. The pain is constant, 10/10, sharp, and radiates to the groin and left knee. Denies numbness and tingling to the LLE.   Imaging shows Left femoral neck fracture with varus angulation. No subluxation or dislocation.    Orthopedics was consulted for evaluation.   Last meal last night around 7pm. No history of MI, CVA, DVT, PE.  Previously ambulatory without the use of assistive devices.  The patient is living in a 2 story home with his ex-wife.  Patient doesn't work as he is on disability due to his back.   Past Medical History:  Diagnosis Date   Anxiety    DDD (degenerative disc disease)    Depression    GERD (gastroesophageal reflux disease)    Hyperlipidemia    Hypertension    Psoriasis    Substance abuse ([place])    Substance induced mood disorder (HCC)    Varicose vein    Past Surgical History:  Procedure Laterality Date   CHOLECYSTECTOMY     SINUSOTOMY     VEIN LIGATION AND STRIPPING     Social History   Socioeconomic History   Marital status: Divorced    Spouse name: Not on file   Number of children: Not on file   Years of education: Not on file   Highest education level: Not on file  Occupational History   Not on file  Tobacco Use   Smoking status: Every Day    Packs/day: 1.00    Types: Cigarettes   Smokeless tobacco: Never  Vaping Use   Vaping Use: Not on file  Substance and Sexual Activity   Alcohol use: Yes    Alcohol/week: 1.0 standard drink    Types: 1 Cans of beer per week    Comment: bi-weekly   Drug use: No   Sexual activity: Not on file  Other  Topics Concern   Not on file  Social History Narrative   Not on file   Social Determinants of Health   Financial Resource Strain: Not on file  Food Insecurity: Not on file  Transportation Needs: Not on file  Physical Activity: Not on file  Stress: Not on file  Social Connections: Not on file   Family History  Problem Relation Age of Onset   Depression Mother    Anxiety disorder Mother    Cancer Father    Allergies  Allergen Reactions   Ceclor [Cefaclor] Other (See Comments)    Reaction: unknown   Ciprofloxacin Other (See Comments)    Reaction: unknown   Citalopram Other (See Comments)    Reaction: Hyper    Seroquel [Quetiapine Fumarate] Other (See Comments)    Reaction: "hung over" feeling   Prior to Admission medications   Medication Sig Start Date End Date Taking? Authorizing Provider  chlordiazePOXIDE (LIBRIUM) 10 MG capsule Take 1 capsule (10 mg total) by mouth 3 (three) times daily. Take 10 mg 3 times a day for 2 days then 10 mg twice a day for 2 days then 10 mg daily for 2 days then stop 11/05/18  Bettey Costa, MD  nicotine (NICODERM CQ - DOSED IN MG/24 HOURS) 21 mg/24hr patch Place 1 patch (21 mg total) onto the skin daily. 10/29/18   Patience Musca, MD  sertraline (ZOLOFT) 100 MG tablet Take 1 tablet (100 mg total) by mouth 2 (two) times daily for 30 days. 10/28/18 11/27/18  Patience Musca, MD  traZODone (DESYREL) 50 MG tablet Take 1 tablet (50 mg total) by mouth at bedtime for 30 days. 10/28/18 11/27/18  Patience Musca, MD   DG Chest 1 View  Result Date: 04/21/2021 CLINICAL DATA:  65 year old male preoperative chest x-ray EXAM: CHEST  1 VIEW COMPARISON:  10/20/2018 FINDINGS: Cardiomediastinal silhouette unchanged in size and contour. No evidence of central vascular congestion. No interlobular septal thickening. Low lung volumes. No pneumothorax or pleural effusion. Coarsened interstitial markings, with no confluent airspace disease. No acute displaced fracture.  Degenerative changes of the spine. IMPRESSION: Negative for acute cardiopulmonary disease Electronically Signed   By: Corrie Mckusick D.O.   On: 04/21/2021 11:00   DG Pelvis 1-2 Views  Result Date: 04/21/2021 CLINICAL DATA:  Fall, left hip pain EXAM: PELVIS - 1-2 VIEW COMPARISON:  03/07/2018 FINDINGS: There is a left femoral neck fracture with varus angulation. No subluxation or dislocation. Hip joints and SI joints are symmetric. IMPRESSION: Left femoral neck fracture with varus angulation. Electronically Signed   By: Rolm Baptise M.D.   On: 04/21/2021 10:33   DG FEMUR MIN 2 VIEWS LEFT  Result Date: 04/21/2021 CLINICAL DATA:  Fall EXAM: LEFT FEMUR 2 VIEWS COMPARISON:  Pelvis study today FINDINGS: There is a left femoral neck fracture with varus angulation. No subluxation or dislocation. No additional femoral abnormality. IMPRESSION: Left femoral neck fracture with varus angulation Electronically Signed   By: Rolm Baptise M.D.   On: 04/21/2021 10:36    Positive ROS: All other systems have been reviewed and were otherwise negative with the exception of those mentioned in the HPI and as above.  Objective: Labs cbc No results for input(s): WBC, HGB, HCT, PLT in the last 72 hours.  Labs inflam No results for input(s): CRP in the last 72 hours.  Invalid input(s): ESR  Labs coag No results for input(s): INR, PTT in the last 72 hours.  Invalid input(s): PT  No results for input(s): NA, K, CL, CO2, GLUCOSE, BUN, CREATININE, CALCIUM in the last 72 hours.  Physical Exam: Vitals:   04/21/21 0930  BP: 125/74  Pulse: 84  Resp: (!) 22  Temp: 98.5 F (36.9 C)  SpO2: 94%   General: Alert, no acute distress. Laying on Biomedical scientist in hallway. Dozing off and on. Seems very sleepy almost inebriated but claims he doesn't drink or use drugs Mental status: Alert and Oriented x3 Neurologic: Speech Clear and organized, no gross focal findings or movement disorder appreciated. Respiratory: No cyanosis, no  use of accessory musculature Cardiovascular: No pedal edema GI: Abdomen is soft and non-tender, non-distended. Skin: Warm and dry.  No lesions in the area of chief complaint. Several abrasions to his left leg, left arm, and right arm from the fall. Extremities: Warm and well perfused w/o edema Psychiatric: Patient is competent for consent with normal mood and affect  MUSCULOSKELETAL:  TTP LLE from the hip to the knee. Left leg is shortened and externally rotated. Very limited ROM at the left hip and left knee due to pain. Full ROM left ankle and foot. Compartments soft and compressible. NVI distally.  Other extremities are atraumatic with painless ROM  and NVI.  Assessment / Plan: Active Problems:   * No active hospital problems. *    Plan is to treat this surgically this afternoon with a Left THA.  Keep patient NPO Place knee high TED hose on patient prior to surgery Will need PT/OT post-op   Weightbearing: pre-op NWB LLE; post-op WBAT LLE Insicional and dressing care: Dressings left intact until follow-up and Reinforce dressings as needed Orthopedic device(s): None Showering: POD 3, keep dressings dry VTE prophylaxis: post-op will use Lovenox 74m qd  while inpatient, can switch to ASA 875mbid x 30 days upon d/c  Pain control: Tylenol, Norco, Oxy all PRN acceptable Follow - up plan: 2 weeks post op in the office Contact information:  TiEdmonia LynchD, MeThe [facility]ology[facility]    MeBritt BottomA-C Office [phone]/26/2022 12:03 PM

## 2021-04-21 NOTE — ED Provider Notes (Signed)
[facility] EMERGENCY DEPARTMENT Provider Note   CSN: [id number] Arrival date & time: 04/21/21  1497     History Chief Complaint  Patient presents with   Hip Injury    Cory Foster. is a 65 y.o. male presented emerged department with left hip pain.  The patient ports he had a mechanical fall and tripped on a rock today, landing on his left hip.  He has not been able to walk on it since.  He reports the pain is 10 out of 10.  It is located in his left hip.  It is worse with leg movement.  He denies numbness or weakness in his lower leg.  He says he is never broken a bone in his legs or hip and does not have an orthopedic doctor.  He is not on blood thinners.  He denies any other injuries.  The patient was given fentanyl by EMS for pain prior to arrival.  HPI     Past Medical History:  Diagnosis Date   Anxiety    DDD (degenerative disc disease)    Depression    GERD (gastroesophageal reflux disease)    Hyperlipidemia    Hypertension    Psoriasis    Substance abuse (HCC)    Substance induced mood disorder (HCC)    Varicose vein     Patient Active Problem List   Diagnosis Date Noted   Fracture of femoral neck, left, closed (HCC) 04/21/2021   Elevated liver enzymes 11/04/2018   Substance induced mood disorder (HCC) 08/02/2018   Acute respiratory failure with hypercapnia (HCC) 01/01/2018   Severe recurrent major depression without psychotic features (HCC) 12/14/2017   Diabetes (HCC) 12/14/2017   Hypertension    Hyperlipidemia    GERD (gastroesophageal reflux disease)    Depression    Substance abuse (HCC)    Anxiety    DDD (degenerative disc disease)    Psoriasis    Alcohol dependence (HCC) 08/08/2013   MDD (major depressive disorder) 08/08/2013   GAD (generalized anxiety disorder) 08/08/2013    Past Surgical History:  Procedure Laterality Date   CHOLECYSTECTOMY     SINUSOTOMY     VEIN LIGATION AND STRIPPING         Family  History  Problem Relation Age of Onset   Depression Mother    Anxiety disorder Mother    Cancer Father     Social History   Tobacco Use   Smoking status: Every Day    Packs/day: 1.00    Types: Cigarettes   Smokeless tobacco: Never  Substance Use Topics   Alcohol use: Yes    Alcohol/week: 1.0 standard drink    Types: 1 Cans of beer per week    Comment: bi-weekly   Drug use: No    Home Medications Prior to Admission medications   Medication Sig Start Date End Date Taking? Authorizing Provider  Adalimumab 40 MG/0.8ML PNKT Inject 40 mg into the skin every 14 (fourteen) days.   Yes [provider]  cyclobenzaprine (FLEXERIL) 5 MG tablet Take 5 mg by mouth 3 (three) times daily as needed for muscle spasms. 03/17/21  Yes [provider]  fluticasone (FLONASE) 50 MCG/ACT nasal spray Place 1 spray into both nostrils daily as needed. 10/19/20  Yes [provider]  HYDROcodone-acetaminophen (NORCO/VICODIN) 5-325 MG tablet Take 1 tablet by mouth 2 (two) times daily as needed for pain. 04/19/21  Yes [provider]  metFORMIN (GLUCOPHAGE) 500 MG tablet Take 500 mg  by mouth 2 (two) times daily as needed. High CBG 01/16/21  Yes [provider]  pregabalin (LYRICA) 50 MG capsule Take 50 mg by mouth 3 (three) times daily. 02/17/21  Yes [provider]  sertraline (ZOLOFT) 100 MG tablet Take 100 mg by mouth daily as needed for anxiety. 10/28/20  Yes [provider]  triamcinolone ointment (KENALOG) 0.1 % Apply 1 application topically 2 (two) times daily as needed for itching. 04/01/20  Yes [provider]  chlordiazePOXIDE (LIBRIUM) 10 MG capsule Take 1 capsule (10 mg total) by mouth 3 (three) times daily. Take 10 mg 3 times a day for 2 days then 10 mg twice a day for 2 days then 10 mg daily for 2 days then stop 11/05/18   Adrian Saran, MD  nicotine (NICODERM CQ - DOSED IN MG/24 HOURS) 21 mg/24hr patch Place 1 patch (21 mg total) onto the  skin daily. 10/29/18   Terance Hart, MD  sertraline (ZOLOFT) 100 MG tablet Take 1 tablet (100 mg total) by mouth 2 (two) times daily for 30 days. 10/28/18 11/27/18  Terance Hart, MD  traZODone (DESYREL) 50 MG tablet Take 1 tablet (50 mg total) by mouth at bedtime for 30 days. 10/28/18 11/27/18  Terance Hart, MD    Allergies    Ceclor [cefaclor], Ciprofloxacin, Citalopram, and Seroquel [quetiapine fumarate]  Review of Systems   Review of Systems  Constitutional:  Negative for chills and fever.  Respiratory:  Negative for cough and shortness of breath.   Cardiovascular:  Negative for chest pain and palpitations.  Gastrointestinal:  Negative for abdominal pain and vomiting.  Genitourinary:  Negative for dysuria and hematuria.  Musculoskeletal:  Positive for arthralgias and myalgias.  Skin:  Negative for color change and rash.  Neurological:  Negative for weakness and numbness.  All other systems reviewed and are negative.  Physical Exam Updated Vital Signs BP 135/61   Pulse 63   Temp 98.5 F (36.9 C)   Resp 16   SpO2 98%   Physical Exam Constitutional:      General: He is not in acute distress. HENT:     Head: Normocephalic and atraumatic.  Eyes:     Conjunctiva/sclera: Conjunctivae normal.     Pupils: Pupils are equal, round, and reactive to light.  Cardiovascular:     Rate and Rhythm: Normal rate and regular rhythm.  Pulmonary:     Effort: Pulmonary effort is normal. No respiratory distress.  Abdominal:     General: There is no distension.     Tenderness: There is no abdominal tenderness.  Musculoskeletal:     Comments: Left leg shortened and everted No visible deformity of the hip  Skin:    General: Skin is warm and dry.  Neurological:     General: No focal deficit present.     Mental Status: He is alert and oriented to person, place, and time. Mental status is at baseline.     Sensory: No sensory deficit.     Motor: No weakness.  Psychiatric:         Mood and Affect: Mood normal.        Behavior: Behavior normal.    ED Results / Procedures / Treatments   Labs (all labs ordered are listed, but only abnormal results are displayed) Labs Reviewed  BASIC METABOLIC PANEL - Abnormal; Notable for the following components:      Result Value   Glucose, Bld 128 (*)    Calcium 8.7 (*)  All other components within normal limits  CBC WITH DIFFERENTIAL/PLATELET - Abnormal; Notable for the following components:   Platelets 72 (*)    All other components within normal limits  RESP PANEL BY RT-PCR (FLU A&B, COVID) [id number]    EKG None  Radiology DG Chest 1 View  Result Date: 04/21/2021 CLINICAL DATA:  65 year old male preoperative chest x-ray EXAM: CHEST  1 VIEW COMPARISON:  10/20/2018 FINDINGS: Cardiomediastinal silhouette unchanged in size and contour. No evidence of central vascular congestion. No interlobular septal thickening. Low lung volumes. No pneumothorax or pleural effusion. Coarsened interstitial markings, with no confluent airspace disease. No acute displaced fracture. Degenerative changes of the spine. IMPRESSION: Negative for acute cardiopulmonary disease Electronically Signed   By: Gilmer Mor D.O.   On: 04/21/2021 11:00   DG Pelvis 1-2 Views  Result Date: 04/21/2021 CLINICAL DATA:  Fall, left hip pain EXAM: PELVIS - 1-2 VIEW COMPARISON:  03/07/2018 FINDINGS: There is a left femoral neck fracture with varus angulation. No subluxation or dislocation. Hip joints and SI joints are symmetric. IMPRESSION: Left femoral neck fracture with varus angulation. Electronically Signed   By: Charlett Nose M.D.   On: 04/21/2021 10:33   DG FEMUR MIN 2 VIEWS LEFT  Result Date: 04/21/2021 CLINICAL DATA:  Fall EXAM: LEFT FEMUR 2 VIEWS COMPARISON:  Pelvis study today FINDINGS: There is a left femoral neck fracture with varus angulation. No subluxation or dislocation. No additional femoral abnormality. IMPRESSION: Left femoral neck fracture with varus  angulation Electronically Signed   By: Charlett Nose M.D.   On: 04/21/2021 10:36    Procedures Procedures   Medications Ordered in ED Medications  acetaminophen (TYLENOL) tablet 1,000 mg (has no administration in time range)  HYDROmorphone (DILAUDID) injection 1 mg (1 mg Intravenous Given 04/21/21 1053)  ketorolac (TORADOL) 30 MG/ML injection 30 mg (30 mg Intravenous Given 04/21/21 1053)  HYDROmorphone (DILAUDID) injection 0.5 mg (0.5 mg Intravenous Given 04/21/21 1319)    ED Course  I have reviewed the triage vital signs and the nursing notes.  Pertinent labs & imaging results that were available during my care of the patient were reviewed by me and considered in my medical decision making (see chart for details).  Left hip pain after mechanical fall Xrays reviewed showing left femoral neck fracture He is neurovascularly intact No other traumatic injuries noted on exam  IV pain medications ordered for pain control NPO for now  Clinical Course as of 04/21/21 1425  Tue Apr 21, 2021  1039 Left voicemail with Wandra Feinstein on call for [facility] ortho unassigned. [MT]  1221 Dr Eulah Pont reports they will attempt hip replacement later today, keep NPO for now, admit to hospitalist [MT]  1257 Will page unassigned for admissoin.  BMP and CBC look okay. [MT]  1257 Admitted to IM service. [MT]    Clinical Course User Index [MT] Khayri Kargbo, Kermit Balo, MD    Final Clinical Impression(s) / ED Diagnoses Final diagnoses:  Closed fracture of neck of left femur, initial encounter [facility])    Rx / DC Orders ED Discharge Orders     None        Terald Sleeper, MD 04/21/21 1425

## 2021-04-21 NOTE — H&P (Addendum)
Date: 04/21/2021               Patient Name:  Cory Foster. MRN: [id number]  DOB: [birth year] Age / Sex: 65 y.o., male   PCP: Pcp, No              Medical Service: Internal Medicine Teaching Service              Attending Physician: Dr. Carlynn Purl    First Contact: Edgardo Roys, MS 3 Pager: [phone]  Second Contact: Dr. Adron Bene Pager: [phone]  Third Contact Dr. Dellia Cloud Pager: [phone]       After Hours (After 5p/  First Contact Pager: [phone]  weekends / holidays): Second Contact Pager: [phone]   Chief Complaint: Left hip pain  History of Present Illness: Cory Foster is a 65 y.o. person living with DM and HLD who presents with left hip pain after a fall this morning at 03:00. He reports taking the garbage out and tripping over a rock in his driveway. He states he could not see the rock because it was dark outside, and he denies any dizziness, lightheadedness, or numbness/tingling in his feet preceding his fall. His pain is currently a 9/10 and has been constant since his fall this morning. He was unable to stand up after falling and had to crawl to the porch where he called an ambulance to transport him to the emergency department. He denies pain in locations aside from his hip.  Meds:  Metformin 500 mg BID daily  -note: patient states he does not take metformin if his blood sugars are not elevated Atorvastatin 80 mg daily  -note: patient states he does not take this medication regularly Lyrica 50 mg TID daily  Patient denies taking any additional medicines.  Allergies: Allergies as of 04/21/2021 - Review Complete 04/21/2021  Allergen Reaction Noted   Ceclor [cefaclor] Other (See Comments) 08/27/2013   Ciprofloxacin Other (See Comments) 08/27/2013   Citalopram Other (See Comments) 08/27/2013   Seroquel [quetiapine fumarate] Other (See Comments) 08/27/2013   Patient does not endorse any known drug allergies today.  Past Medical  History:  Diagnosis Date   Anxiety    DDD (degenerative disc disease)    Depression    GERD (gastroesophageal reflux disease)    Hyperlipidemia    Hypertension    Psoriasis    Substance abuse (HCC)    Substance induced mood disorder (HCC)    Varicose vein    Patient reports a history of diabetes and psoriatic arthritis.  Past Surgical History:  Procedure Laterality Date   CHOLECYSTECTOMY     SINUSOTOMY     VEIN LIGATION AND STRIPPING     Patient reports multiple surgeries for cataract and retinal detachment issues. He has additionally had a surgery on his back for replacement of a herniated disk.  Family History: He states he does not have anything to do with his family and cannot speak to any family history of heart disease, diabetes, etc.  Social History: Patient reports being on disability and living with a roommate. Since moving to [facility] he has not had follow up with a primary care provider. He has been smoking a pack and a half of cigarettes per day for at least 20-30 years. He denies alcohol and states he has not been drinking at all, but previous documentation shows he may be drinking up to 1 drink per week. He denies other drug use.  Review of Systems: A complete ROS was negative  except as per HPI.  Physical Exam: Blood pressure 135/61, pulse 63, temperature 98.5 F (36.9 C), resp. rate 16, SpO2 98 %.  Constitutional: laying on stretcher in hallway, in no acute distress and appears sleepy HENT: normocephalic atraumatic Eyes: conjunctiva non-erythematous Neck: supple Cardiovascular: regular rate and rhythm, no m/r/g Pulmonary/Chest: normal work of breathing on room air Abdominal: soft, non-tender, non-distended MSK: normal bulk and tone, left leg shortened and externally rotated Neurological: alert answering questions appropriately Skin: warm and dry Psych: appropriate mood and affect  Pertinent Labs: CBC    Component Value Date/Time   WBC 6.1  04/21/2021 1216   RBC 4.35 04/21/2021 1216   HGB 14.1 04/21/2021 1216   HGB 13.9 10/17/2013 1110   HCT 41.6 04/21/2021 1216   HCT 41.5 10/17/2013 1110   PLT 72 (L) 04/21/2021 1216   PLT 135 (L) 10/17/2013 1110   MCV 95.6 04/21/2021 1216   MCV 107 (H) 10/17/2013 1110   MCH 32.4 04/21/2021 1216   MCHC 33.9 04/21/2021 1216   RDW 12.9 04/21/2021 1216   RDW 19.6 (H) 10/17/2013 1110   LYMPHSABS 0.9 04/21/2021 1216   LYMPHSABS 1.9 10/16/2013 1818   MONOABS 0.7 04/21/2021 1216   MONOABS 0.6 10/16/2013 1818   EOSABS 0.1 04/21/2021 1216   EOSABS 0.1 10/16/2013 1818   BASOSABS 0.0 04/21/2021 1216   BASOSABS 0.1 10/16/2013 1818   Pertinent Imaging: Pelvic X-Ray: left femoral neck fracture with varus angulation. No subluxation or dislocation.  Assessment & Plan by Problem: Principal Problem:   Fracture of femoral neck, left, closed Active Problems:   Thrombocytopenia   Diabetes Mellitus   Hyperlipidemia  Patient Summary: Cory Foster is a 65 y.o. living with DM and HLD who presents with left hip pain after a fall and is being admitted for management of a close left femoral neck fracture.  Fracture of femoral neck, left, closed Patient presented with acute left hip pain after a fall and imaging consistent with closed left femoral neck fracture with varus angulation. Orthopedic surgery is following and has decided to proceed surgically with left THA. His pain is currently being managed with 0.5 mg dose of dilaudid. He does not endorse and personal or family history of cardiovascular disease, so he is unlikely to carry significant cardiovascular risk into the operation. He is not currently taking any antiplatelet or anticoagulation but will need DVT prophylaxis moving forward. He will need PT/OT evaluation. -Pain control:  -acetaminophen 1000 mg every 6 hours prn  -dilaudid 0.5 mg every 4 hours prn -Consult PT/OT -Recommendations per ortho:  -post op weight bearing as  tolerated  -reinforce dressing as needed and keep dry, showering POD 3  -VTE prophylaxis post-op with lovenox 40 mg qd while inpatient  -Switch lovenox to ASA 81 mg bid x 30 days at discharge  -follow up with ortho in 2 weeks  Thrombocytopenia Platelet count of 72 on admission today. Patient does not endorse any alcohol consumption but concerned for potential alcohol induced vs. medication induced thrombocytopenia. No obvious sources of bleeding at this time. -will obtain urine drug screen -check reticulocyte count -check peripheral blood smear  Diabetes Mellitus Hyperlipidemia Patient reports only taking his metformin if his blood sugar is elevated. Most recent A1c 5.7 one year ago. He reports he is not taking his atorvastatin regularly. Patient reports no primary care physician since moving to the area. He will likely benefit from consistent PCP follow up. -will recheck A1c -CBG monitoring each morning -will restart metformin once patient  is stable after surgery -consult TOC team for PCP follow up  Code: Full  Dispo: Admit patient to Inpatient with expected length of stay greater than 2 midnights.  Signed: Val Eagle, Medical Student 04/21/2021, 2:06 PM  Pager: [phone]  Attestation for Student Documentation:  I personally was present and performed or re-performed the history, physical exam and medical decision-making activities of this service and have verified that the service and findings are accurately documented in the student's note.  Chari Manning, D.O.  Internal Medicine Resident, PGY-3 [facility]  Pager: [phone] 5:53 PM, 04/21/2021   **Please contact the on call pager after 5 pm and on weekends at [phone].**

## 2021-04-21 NOTE — Anesthesia Procedure Notes (Signed)
Procedure Name: Intubation Date/Time: 04/21/2021 3:59 PM Performed by: Marena Chancy, CRNA Pre-anesthesia Checklist: Patient identified, Emergency Drugs available, Suction available and Patient being monitored Patient Re-evaluated:Patient Re-evaluated prior to induction Oxygen Delivery Method: Circle System Utilized Preoxygenation: Pre-oxygenation with 100% oxygen Induction Type: IV induction Ventilation: Mask ventilation without difficulty Laryngoscope Size: Miller and 3 Grade View: Grade I Tube type: Oral Tube size: 8.0 mm Number of attempts: 1 Airway Equipment and Method: Stylet and Oral airway Placement Confirmation: ETT inserted through vocal cords under direct vision, positive ETCO2 and breath sounds checked- equal and bilateral Tube secured with: Tape Dental Injury: Teeth and Oropharynx as per pre-operative assessment

## 2021-04-21 NOTE — Op Note (Signed)
04/21/2021  5:29 PM  PATIENT:  Cory Foster.   MRN: [id number]  PRE-OPERATIVE DIAGNOSIS:  FALL  POST-OPERATIVE DIAGNOSIS:  FALL  PROCEDURE:  Procedure(s): LEFT TOTAL HIP ARTHROPLASTY ANTERIOR APPROACH  PREOPERATIVE INDICATIONS:    Cory Foster. is an 65 y.o. male who has a diagnosis of <principal problem not specified> and elected for surgical management after failing conservative treatment.  The risks benefits and alternatives were discussed with the patient including but not limited to the risks of nonoperative treatment, versus surgical intervention including infection, bleeding, nerve injury, periprosthetic fracture, the need for revision surgery, dislocation, leg length discrepancy, blood clots, cardiopulmonary complications, morbidity, mortality, among others, and they were willing to proceed.     OPERATIVE REPORT     SURGEON:   Renette Butters, MD    ASSISTANT:  Aggie Moats, PA-C, he was present and scrubbed throughout the case, critical for completion in a timely fashion, and for retraction, instrumentation, and closure.     ANESTHESIA:  General    COMPLICATIONS:  None.     COMPONENTS:  Stryker acolade fit femur size 7 with a 36 mm +2.5 head ball and an acetabular shell size 52 with a  polyethylene liner    PROCEDURE IN DETAIL:   The patient was met in the holding area and  identified.  The appropriate hip was identified and marked at the operative site.  The patient was then transported to the OR  and  placed under anesthesia per that record.  At that point, the patient was  placed in the supine position and  secured to the operating room table and all bony prominences padded. He received pre-operative antibiotics    The operative lower extremity was prepped from the iliac crest to the distal leg.  Sterile draping was performed.  Time out was performed prior to incision.      Skin incision was made just 2 cm lateral to the ASIS  extending  in line with the tensor fascia lata. Electrocautery was used to control all bleeders. I dissected down sharply to the fascia of the tensor fascia lata was confirmed that the muscle fibers beneath were running posteriorly. I then incised the fascia over the superficial tensor fascia lata in line with the incision. The fascia was elevated off the anterior aspect of the muscle the muscle was retracted posteriorly and protected throughout the case. I then used electrocautery to incise the tensor fascia lata fascia control and all bleeders. Immediately visible was the fat over top of the anterior neck and capsule.  I removed the anterior fat from the capsule and elevated the rectus muscle off of the anterior capsule. I then removed a large time of capsule. The retractors were then placed over the anterior acetabulum as well as around the superior and inferior neck.  I then made a femoral neck cut to clean up the fracture that had fallen into varus. Then used the power corkscrew to remove the femoral head from the acetabulum and thoroughly irrigated the acetabulum. I sized the femoral head.    I then exposed the deep acetabulum, cleared out any tissue including the ligamentum teres.   After adequate visualization, I excised the labrum, and then sequentially reamed.  I then impacted the acetabular implant into place using fluoroscopy for guidance.  Appropriate version and inclination was confirmed clinically matching their bony anatomy, and with fluoroscopy.  I placed a 20 mm screw in the posterior/superio position with an excellent bite.  I then placed the polyethylene liner in place  I then adducted the leg and released the external rotators from the posterior femur allowing it to be easily delivered up lateral and anterior to the acetabulum for preparation of the femoral canal.    I then prepared the proximal femur using the cookie-cutter and then sequentially reamed and broached.  A trial broach,  neck, and head was utilized, and I reduced the hip and used floroscopy to assess the neck length and femoral implant.  I then impacted the femoral prosthesis into place into the appropriate version. The hip was then reduced and fluoroscopy confirmed appropriate position. Leg lengths were restored.  I then irrigated the hip copiously again with, and repaired the fascia with Vicryl, followed by monocryl for the subcutaneous tissue, Monocryl for the skin, Steri-Strips and sterile gauze. The patient was then awakened and returned to PACU in stable and satisfactory condition. There were no complications.  POST OPERATIVE PLAN: WBAT, DVT px: SCD's/TED, ambulation and chemical dvt px  Aarica Wax, MD Orthopedic Surgeon [phone]     

## 2021-04-21 NOTE — Hospital Course (Addendum)
Hx of cirrhosis, last EGD? Will likely need EGD on discharge Start on CIWA    Admitted 04/21/2021  Allergies: Ceclor [cefaclor], Ciprofloxacin, Citalopram, and Seroquel [quetiapine fumarate] Pertinent Hx: Anxiety, DDD, Depression, GERD, Hyperlipidemia, Hypertension, Psoriasis  65 y.o. male p/w fall/hip injury:  Hip fracture -   *   *   Consults: orthopedics  Meds:   VTE ppx:   IVF:   Diet:     No current facility-administered medications on file prior to encounter.   Current Outpatient Medications on File Prior to Encounter  Medication Sig Dispense Refill   chlordiazePOXIDE (LIBRIUM) 10 MG capsule Take 1 capsule (10 mg total) by mouth 3 (three) times daily. Take 10 mg 3 times a day for 2 days then 10 mg twice a day for 2 days then 10 mg daily for 2 days then stop 30 capsule 0   nicotine (NICODERM CQ - DOSED IN MG/24 HOURS) 21 mg/24hr patch Place 1 patch (21 mg total) onto the skin daily. 28 patch 0   sertraline (ZOLOFT) 100 MG tablet Take 1 tablet (100 mg total) by mouth 2 (two) times daily for 30 days. 60 tablet 0   traZODone (DESYREL) 50 MG tablet Take 1 tablet (50 mg total) by mouth at bedtime for 30 days. 30 tablet 0    Diet Orders (From admission, onward)     Start     Ordered   04/21/21 0944  Diet NPO time specified  Diet effective now        04/21/21 0944              ACCU-CHEK AVIVA CONTROL SOLN Soln   acetaminophen (TYLENOL) 500 MG tablet Take 500 mg by mouth every six (6) hours as needed.   adalimumab (HUMIRA) 40 mg/0.8 mL subcutaneous pen kit Inject 40 mg under the skin.   aspirin (ECOTRIN) 81 MG tablet Take 1 tablet (81 mg total) by mouth daily. 150 tablet 2   atorvastatin (LIPITOR) 80 MG tablet Take 1 tablet (80 mg total) by mouth daily. 90 tablet 1   blood sugar diagnostic (ACCU-CHEK AVIVA PLUS TEST STRP) Strp by Other route Three (3) times a day before meals. 300 strip 1   blood-glucose meter kit Use as directed 1 each 0   cyclobenzaprine (FLEXERIL)  5 MG tablet Take 2 tablets (10 mg total) by mouth Three (3) times a day as needed for muscle spasms. 90 tablet 2   fluticasone propionate (FLONASE) 50 mcg/actuation nasal spray 1 spray by Each Nare route daily. 3 Bottle 1   HYDROcodone-acetaminophen (NORCO) 5-325 mg per tablet TAKE 1 TABLET BY MOUTH EVERY 8 TO 12 HOURS FOR PAIN   lancets 30 gauge Misc 1 each by Other route Three (3) times a day before meals. 300 each 1   lancing device Misc Take 1 each by mouth Three (3) times a day before meals. 1 each 1   meloxicam (MOBIC) 7.5 MG tablet   metFORMIN (GLUCOPHAGE) 500 MG tablet Take 2 tablets (1,000 mg total) by mouth 2 (two) times a day with meals. 360 tablet 1   MULTIVITAMIN/IRON/FOLIC ACID (CENTRUM COMPLETE ORAL) Take 1 tablet by mouth daily.   pen needle, diabetic (BD ULTRA-FINE NANO PEN NEEDLE) 32 gauge x 5/32" Ndle Use to Inject insulin daily as instructed. 250.00 200 each 3   pregabalin (LYRICA) 50 MG capsule Take 50 mg by mouth.   propylene glycoL (SYSTANE BALANCE) 0.6 % Drop INT IN OU UTD 3 TO 5 XD PRF DRYNESS  sertraline (ZOLOFT) 100 MG tablet Take 2 tablets (200 mg total) by mouth daily. 180 tablet 1   traZODone (DESYREL) 50 MG tablet Take 1 tablet (50 mg total) by mouth nightly. 90 tablet 1   triamcinolone (KENALOG) 0.1 % ointment APPLY TOPICALLY TO THE AFFECTED AREA TWICE DAILY   hydrOXYzine (ATARAX) 25 MG tablet Take 2 tablets (50 mg total) by mouth every eight (8) hours as needed for anxiety. (Patient not taking: Reported on 04/25/2020) 180 tablet 2   neomycin-polymyxin-hydrocortisone (CORTISPORIN) 3.5-10,000-1 mg/mL-unit/mL-% otic suspension Administer 3 drops into both ears Four (4) times a day for 10 days. 10 mL 0   propranoloL (INDERAL LA) 80 mg 24 hr capsule Take 1 capsule (80 mg total) by mouth daily. 90 capsule 1

## 2021-04-21 NOTE — Consult Note (Signed)
ORTHOPAEDIC CONSULTATION  REQUESTING PHYSICIAN: Wyvonnia Dusky, MD  Chief Complaint: left hip pain after a fall  HPI: Cory Baiz. is a 65 y.o. male who complains of left hip pain after falling outside at home when he tripped over a rock. He says he was helping some friends with yard work at 3 am this morning. He had immediate pain and was unable to bear weight on the left leg. The pain is constant, 10/10, sharp, and radiates to the groin and left knee. Denies numbness and tingling to the LLE.   Imaging shows Left femoral neck fracture with varus angulation. No subluxation or dislocation.    Orthopedics was consulted for evaluation.   Last meal last night around 7pm. No history of MI, CVA, DVT, PE.  Previously ambulatory without the use of assistive devices.  The patient is living in a 2 story home with his ex-wife.  Patient doesn't work as he is on disability due to his back.   Past Medical History:  Diagnosis Date   Anxiety    DDD (degenerative disc disease)    Depression    GERD (gastroesophageal reflux disease)    Hyperlipidemia    Hypertension    Psoriasis    Substance abuse ([place])    Substance induced mood disorder (HCC)    Varicose vein    Past Surgical History:  Procedure Laterality Date   CHOLECYSTECTOMY     SINUSOTOMY     VEIN LIGATION AND STRIPPING     Social History   Socioeconomic History   Marital status: Divorced    Spouse name: Not on file   Number of children: Not on file   Years of education: Not on file   Highest education level: Not on file  Occupational History   Not on file  Tobacco Use   Smoking status: Every Day    Packs/day: 1.00    Types: Cigarettes   Smokeless tobacco: Never  Vaping Use   Vaping Use: Not on file  Substance and Sexual Activity   Alcohol use: Yes    Alcohol/week: 1.0 standard drink    Types: 1 Cans of beer per week    Comment: bi-weekly   Drug use: No   Sexual activity: Not on file  Other  Topics Concern   Not on file  Social History Narrative   Not on file   Social Determinants of Health   Financial Resource Strain: Not on file  Food Insecurity: Not on file  Transportation Needs: Not on file  Physical Activity: Not on file  Stress: Not on file  Social Connections: Not on file   Family History  Problem Relation Age of Onset   Depression Mother    Anxiety disorder Mother    Cancer Father    Allergies  Allergen Reactions   Ceclor [Cefaclor] Other (See Comments)    Reaction: unknown   Ciprofloxacin Other (See Comments)    Reaction: unknown   Citalopram Other (See Comments)    Reaction: Hyper    Seroquel [Quetiapine Fumarate] Other (See Comments)    Reaction: "hung over" feeling   Prior to Admission medications   Medication Sig Start Date End Date Taking? Authorizing Provider  chlordiazePOXIDE (LIBRIUM) 10 MG capsule Take 1 capsule (10 mg total) by mouth 3 (three) times daily. Take 10 mg 3 times a day for 2 days then 10 mg twice a day for 2 days then 10 mg daily for 2 days then stop 11/05/18  Bettey Costa, MD  nicotine (NICODERM CQ - DOSED IN MG/24 HOURS) 21 mg/24hr patch Place 1 patch (21 mg total) onto the skin daily. 10/29/18   Patience Musca, MD  sertraline (ZOLOFT) 100 MG tablet Take 1 tablet (100 mg total) by mouth 2 (two) times daily for 30 days. 10/28/18 11/27/18  Patience Musca, MD  traZODone (DESYREL) 50 MG tablet Take 1 tablet (50 mg total) by mouth at bedtime for 30 days. 10/28/18 11/27/18  Patience Musca, MD   DG Chest 1 View  Result Date: 04/21/2021 CLINICAL DATA:  65 year old male preoperative chest x-ray EXAM: CHEST  1 VIEW COMPARISON:  10/20/2018 FINDINGS: Cardiomediastinal silhouette unchanged in size and contour. No evidence of central vascular congestion. No interlobular septal thickening. Low lung volumes. No pneumothorax or pleural effusion. Coarsened interstitial markings, with no confluent airspace disease. No acute displaced fracture.  Degenerative changes of the spine. IMPRESSION: Negative for acute cardiopulmonary disease Electronically Signed   By: Corrie Mckusick D.O.   On: 04/21/2021 11:00   DG Pelvis 1-2 Views  Result Date: 04/21/2021 CLINICAL DATA:  Fall, left hip pain EXAM: PELVIS - 1-2 VIEW COMPARISON:  03/07/2018 FINDINGS: There is a left femoral neck fracture with varus angulation. No subluxation or dislocation. Hip joints and SI joints are symmetric. IMPRESSION: Left femoral neck fracture with varus angulation. Electronically Signed   By: Rolm Baptise M.D.   On: 04/21/2021 10:33   DG FEMUR MIN 2 VIEWS LEFT  Result Date: 04/21/2021 CLINICAL DATA:  Fall EXAM: LEFT FEMUR 2 VIEWS COMPARISON:  Pelvis study today FINDINGS: There is a left femoral neck fracture with varus angulation. No subluxation or dislocation. No additional femoral abnormality. IMPRESSION: Left femoral neck fracture with varus angulation Electronically Signed   By: Rolm Baptise M.D.   On: 04/21/2021 10:36    Positive ROS: All other systems have been reviewed and were otherwise negative with the exception of those mentioned in the HPI and as above.  Objective: Labs cbc No results for input(s): WBC, HGB, HCT, PLT in the last 72 hours.  Labs inflam No results for input(s): CRP in the last 72 hours.  Invalid input(s): ESR  Labs coag No results for input(s): INR, PTT in the last 72 hours.  Invalid input(s): PT  No results for input(s): NA, K, CL, CO2, GLUCOSE, BUN, CREATININE, CALCIUM in the last 72 hours.  Physical Exam: Vitals:   04/21/21 0930  BP: 125/74  Pulse: 84  Resp: (!) 22  Temp: 98.5 F (36.9 C)  SpO2: 94%   General: Alert, no acute distress. Laying on Biomedical scientist in hallway. Dozing off and on. Seems very sleepy almost inebriated but claims he doesn't drink or use drugs Mental status: Alert and Oriented x3 Neurologic: Speech Clear and organized, no gross focal findings or movement disorder appreciated. Respiratory: No cyanosis, no  use of accessory musculature Cardiovascular: No pedal edema GI: Abdomen is soft and non-tender, non-distended. Skin: Warm and dry.  No lesions in the area of chief complaint. Several abrasions to his left leg, left arm, and right arm from the fall. Extremities: Warm and well perfused w/o edema Psychiatric: Patient is competent for consent with normal mood and affect  MUSCULOSKELETAL:  TTP LLE from the hip to the knee. Left leg is shortened and externally rotated. Very limited ROM at the left hip and left knee due to pain. Full ROM left ankle and foot. Compartments soft and compressible. NVI distally.  Other extremities are atraumatic with painless ROM  and NVI.  Assessment / Plan: Active Problems:   * No active hospital problems. *    Plan is to treat this surgically this afternoon with a Left THA.  Keep patient NPO Place knee high TED hose on patient prior to surgery Will need PT/OT post-op   Weightbearing: pre-op NWB LLE; post-op WBAT LLE Insicional and dressing care: Dressings left intact until follow-up and Reinforce dressings as needed Orthopedic device(s): None Showering: POD 3, keep dressings dry VTE prophylaxis: post-op will use Lovenox 65m qd  while inpatient, can switch to ASA 866mbid x 30 days upon d/c  Pain control: Tylenol, Norco, Oxy all PRN acceptable Follow - up plan: 2 weeks post op in the office Contact information:  TiEdmonia LynchD, MeSpecial[facility] WinnfieldA-C    MeBritt BottomA-C Office [phone]/26/2022 12:03 PM

## 2021-04-21 NOTE — ED Triage Notes (Signed)
Pt bib ems with reports of gardening at 0300 slipped and fell on sidewalk at home. L hip pain, leg pain. Shortening approx 1 inch. CNS intact. 20g LAC. Given fentanyl. 140/84 HR NSR CBG 188 96% RA

## 2021-04-21 NOTE — Interval H&P Note (Signed)
History and Physical Interval Note:  04/21/2021 3:45 PM  Cory Foster.  has presented today for surgery, with the diagnosis of FALL.  The various methods of treatment have been discussed with the patient and family. After consideration of risks, benefits and other options for treatment, the patient has consented to  Procedure(s): TOTAL HIP ARTHROPLASTY ANTERIOR APPROACH (N/A) as a surgical intervention.  The patient's history has been reviewed, patient examined, no change in status, stable for surgery.  I have reviewed the patient's chart and labs.  Questions were answered to the patient's satisfaction.     Sheral Apley

## 2021-04-21 NOTE — Transfer of Care (Signed)
Immediate Anesthesia Transfer of Care Note  Patient: Cory Foster.  Procedure(s) Performed: LEFT TOTAL HIP ARTHROPLASTY ANTERIOR APPROACH (Left: Hip)  Patient Location: PACU  Anesthesia Type:General  Level of Consciousness: awake and patient cooperative  Airway & Oxygen Therapy: Patient Spontanous Breathing  Post-op Assessment: Report given to RN and Post -op Vital signs reviewed and stable  Post vital signs: Reviewed and stable  Last Vitals:  Vitals Value Taken Time   139/76 04/21/21 1815  Temp    Pulse 88 04/21/21 1816  Resp 15 04/21/21 1816  SpO2 96 % 04/21/21 1816  Vitals shown include unvalidated device data.  Last Pain:  Vitals:   04/21/21 1438  TempSrc: Oral  PainSc: 9       Patients Stated Pain Goal: 6 (04/21/21 1438)  Complications: No notable events documented.

## 2021-04-22 ENCOUNTER — Encounter (HOSPITAL_COMMUNITY): Payer: Self-pay | Admitting: Orthopedic Surgery

## 2021-04-22 DIAGNOSIS — I10 Essential (primary) hypertension: Secondary | ICD-10-CM

## 2021-04-22 DIAGNOSIS — D696 Thrombocytopenia, unspecified: Secondary | ICD-10-CM

## 2021-04-22 DIAGNOSIS — E785 Hyperlipidemia, unspecified: Secondary | ICD-10-CM | POA: Diagnosis not present

## 2021-04-22 LAB — COMPREHENSIVE METABOLIC PANEL
ALT: 13 U/L (ref 0–44)
AST: 20 U/L (ref 15–41)
Albumin: 3.1 g/dL — ABNORMAL LOW (ref 3.5–5.0)
Alkaline Phosphatase: 51 U/L (ref 38–126)
Anion gap: 9 (ref 5–15)
BUN: 12 mg/dL (ref 8–23)
CO2: 23 mmol/L (ref 22–32)
Calcium: 8.3 mg/dL — ABNORMAL LOW (ref 8.9–10.3)
Chloride: 105 mmol/L (ref 98–111)
Creatinine, Ser: 1.24 mg/dL (ref 0.61–1.24)
GFR, Estimated: >60 mL/min (ref >60)
Glucose, Bld: 174 mg/dL — ABNORMAL HIGH (ref 70–99)
Potassium: 4.2 mmol/L (ref 3.5–5.1)
Sodium: 137 mmol/L (ref 135–145)
Total Bilirubin: 1.6 mg/dL — ABNORMAL HIGH (ref 0.3–1.2)
Total Protein: 5.6 g/dL — ABNORMAL LOW (ref 6.5–8.1)

## 2021-04-22 LAB — LIPID PANEL
Cholesterol: 75 mg/dL (ref 0–200)
HDL: 29 mg/dL — ABNORMAL LOW (ref >40)
LDL Cholesterol: 33 mg/dL (ref 0–99)
Total CHOL/HDL Ratio: 2.6 RATIO
Triglycerides: 63 mg/dL (ref <150)
VLDL: 13 mg/dL (ref 0–40)

## 2021-04-22 LAB — CBC
HCT: 32.5 % — ABNORMAL LOW (ref 39.0–52.0)
Hemoglobin: 11.3 g/dL — ABNORMAL LOW (ref 13.0–17.0)
MCH: 32.7 pg (ref 26.0–34.0)
MCHC: 34.8 g/dL (ref 30.0–36.0)
MCV: 93.9 fL (ref 80.0–100.0)
Platelets: 80 10*3/uL — ABNORMAL LOW (ref 150–400)
RBC: 3.46 MIL/uL — ABNORMAL LOW (ref 4.22–5.81)
RDW: 13.2 % (ref 11.5–15.5)
WBC: 9.7 10*3/uL (ref 4.0–10.5)
nRBC: 0 % (ref 0.0–0.2)

## 2021-04-22 LAB — HEMOGLOBIN A1C
Hgb A1c MFr Bld: 5.9 % — ABNORMAL HIGH (ref 4.8–5.6)
Mean Plasma Glucose: 123 mg/dL

## 2021-04-22 LAB — VITAMIN D 25 HYDROXY (VIT D DEFICIENCY, FRACTURES): Vit D, 25-Hydroxy: 21.54 ng/mL — ABNORMAL LOW (ref 30–100)

## 2021-04-22 LAB — GLUCOSE, CAPILLARY: Glucose-Capillary: 155 mg/dL — ABNORMAL HIGH (ref 70–99)

## 2021-04-22 MED ORDER — ASPIRIN EC 81 MG PO TBEC
81.0000 mg | DELAYED_RELEASE_TABLET | Freq: Two times a day (BID) | ORAL | 0 refills | Status: DC
Start: 2021-04-22 — End: 2021-04-23

## 2021-04-22 MED ORDER — OXYCODONE HCL 5 MG PO TABS: 5.0000 mg | ORAL_TABLET | Freq: Four times a day (QID) | ORAL | 0 refills | Status: AC | PRN

## 2021-04-22 NOTE — Evaluation (Signed)
Occupational Therapy Evaluation Patient Details Name: Cory Foster. MRN: [id number] DOB: 04/22/1956 Today's Date: 04/22/2021    History of Present Illness 65 yo male presents to [facility] on 7/26 with fall in garden, sustain L femoral neck fx. s/p L DA-THA on 7/26. PMH includes DDD, HTN, HLD, substance abuse, anxiety, depression.   Clinical Impression   Pt was functioning independently prior to admission. Presents with L hip pain, unsteady gait, impulsivity and decreased safety awareness. He requires set up to min assist for ADL and mobility. He is eager to go home tomorrow to smoke.     Follow Up Recommendations  No OT follow up    Equipment Recommendations  None recommended by OT (pt only wanting crutches, declined 3 in 1)    Recommendations for Other Services       Precautions / Restrictions Precautions Precautions: Fall Restrictions Weight Bearing Restrictions: No Other Position/Activity Restrictions: WBAT      Mobility Bed Mobility Overal bed mobility: Needs Assistance Bed Mobility: Supine to Sit     Supine to sit: Min guard;HOB elevated     General bed mobility comments: for safety, moves quickly    Transfers Overall transfer level: Needs assistance Equipment used: Rolling walker (2 wheeled) Transfers: Sit to/from Stand Sit to Stand: Min assist;From elevated surface         General transfer comment: min assist for initial power up and rise, verbal cuing for hand placement when rising/sitting as well as slow eccentric lower when moving stand>sit (not followed).    Balance Overall balance assessment: Needs assistance;History of Falls Sitting-balance support: No upper extremity supported;Feet supported Sitting balance-Leahy Scale: Good     Standing balance support: No upper extremity supported Standing balance-Leahy Scale: Poor Standing balance comment: min assist when releasing walker in static standing to don mask                            ADL either performed or assessed with clinical judgement   ADL Overall ADL's : Needs assistance/impaired Eating/Feeding: Independent;Sitting   Grooming: Minimal assistance;Sitting Grooming Details (indicate cue type and reason): loses balance without UE support Upper Body Bathing: Set up;Sitting   Lower Body Bathing: Minimal assistance;Sit to/from stand   Upper Body Dressing : Set up;Sitting   Lower Body Dressing: Minimal assistance;Sit to/from stand Lower Body Dressing Details (indicate cue type and reason): can cross L LE over R to don sock Toilet Transfer: Minimal assistance;Ambulation;RW Toilet Transfer Details (indicate cue type and reason): simulated, decreased control of descent Toileting- Clothing Manipulation and Hygiene: Minimal assistance;Sit to/from stand       Functional mobility during ADLs: Rolling walker;Minimal assistance;Cueing for safety General ADL Comments: Pt's poor safety awareness and static balance limiting independence.     Vision         Perception     Praxis      Pertinent Vitals/Pain Pain Assessment: 0-10 Pain Score: 7  Pain Location: L hip Pain Descriptors / Indicators: Sore;Discomfort;Grimacing Pain Intervention(s): Monitored during session;Repositioned;Ice applied;Patient requesting pain meds-RN notified     Hand Dominance Right   Extremity/Trunk Assessment Upper Extremity Assessment Upper Extremity Assessment: Overall WFL for tasks assessed (contracture 3rd finger)   Lower Extremity Assessment Lower Extremity Assessment: Defer to PT evaluation LLE Deficits / Details: anticipated post-operative weakness; able to perform ankle pumps, quad set, hip abd/ad   Cervical / Trunk Assessment Cervical / Trunk Assessment: Normal   Communication Communication Communication: No difficulties  Cognition Arousal/Alertness: Awake/alert Behavior During Therapy: Impulsive Overall Cognitive Status: Impaired/Different from  baseline Area of Impairment: Safety/judgement                         Safety/Judgement: Decreased awareness of deficits;Decreased awareness of safety         General Comments       Exercises Total Joint Exercises Ankle Circles/Pumps: AROM;Both;10 reps;Seated   Shoulder Instructions      Home Living Family/patient expects to be discharged to:: Private residence Living Arrangements: Non-relatives/Friends (ex wife) Available Help at Discharge: Family;Available PRN/intermittently Type of Home: House Home Access: Stairs to enter [employer] of Steps: 4 Entrance Stairs-Rails: Right;Left;Can reach both Home Layout: Multi-level;1/2 bath on main level;Able to live on main level with bedroom/bathroom     Bathroom Shower/Tub: Producer, television/film/video: Handicapped height     Home Equipment: None          Prior Functioning/Environment Level of Independence: Independent                 OT Problem List: Decreased strength;Decreased activity tolerance;Impaired balance (sitting and/or standing);Decreased knowledge of use of DME or AE;Pain;Decreased safety awareness      OT Treatment/Interventions: Self-care/ADL training;DME and/or AE instruction;Patient/family education;Balance training;Therapeutic activities    OT Goals(Current goals can be found in the care plan section) Acute Rehab OT Goals Patient Stated Goal: go home and get me a cigarette Time For Goal Achievement: 05/06/21 Potential to Achieve Goals: Good ADL Goals Pt Will Perform Grooming: with modified independence;standing Pt Will Perform Lower Body Bathing: with modified independence;sit to/from stand Pt Will Perform Lower Body Dressing: with modified independence;sit to/from stand Pt Will Transfer to Toilet: with modified independence;ambulating Pt Will Perform Toileting - Clothing Manipulation and hygiene: with modified independence;sit to/from stand Pt Will Perform Tub/Shower  Transfer: Shower transfer;ambulating;with supervision  OT Frequency: Min 2X/week   Barriers to D/C:            Co-evaluation              AM-PAC OT "6 Clicks" Daily Activity     Outcome Measure Help from another person eating meals?: None Help from another person taking care of personal grooming?: A Little Help from another person toileting, which includes using toliet, bedpan, or urinal?: A Little Help from another person bathing (including washing, rinsing, drying)?: A Little Help from another person to put on and taking off regular upper body clothing?: None Help from another person to put on and taking off regular lower body clothing?: A Little 6 Click Score: 20   End of Session Equipment Utilized During Treatment: Rolling walker Nurse Communication: Patient requests pain meds;Mobility status  Activity Tolerance: Patient tolerated treatment well Patient left: in chair;with call bell/phone within reach;with chair alarm set  OT Visit Diagnosis: Other abnormalities of gait and mobility (R26.89);Unsteadiness on feet (R26.81);Pain                Time: 6283-1517 OT Time Calculation (min): 14 min Charges:  OT General Charges $OT Visit: 1 Visit OT Evaluation $OT Eval Moderate Complexity: 1 Mod  Martie Round, OTR/L Acute Rehabilitation Services Pager: [phone] Office: [phone]  Evern Bio 04/22/2021, 4:21 PM

## 2021-04-22 NOTE — Progress Notes (Signed)
Subjective: Patient reports pain as mild to moderate. Well controlled with medicines. Feels good. Looks much better than he did pre-op. Tolerating diet. Urinating. Slept well. No CP, SOB.  Has mobilized with PT/OT OOB. Eager to go home.  Objective:   VITALS:   Vitals:   04/21/21 1857 04/21/21 2255 04/22/21 0327 04/22/21 0747  BP: 102/71 92/60 (!) 87/57 (!) 102/56  Pulse: 75 84 83 84  Resp: 17 16 14 14   Temp: 97.9 F (36.6 C) 97.7 F (36.5 C) 98 F (36.7 C) 98.7 F (37.1 C)  TempSrc: Oral Oral Oral Oral  SpO2: 92% 96% 95% 95%  Weight:      Height:       CBC Latest Ref Rng & Units 04/22/2021 04/21/2021 04/21/2021  WBC 4.0 - 10.5 K/uL 9.7 8.1 6.1  Hemoglobin 13.0 - 17.0 g/dL 11.3(L) 12.4(L) 14.1  Hematocrit 39.0 - 52.0 % 32.5(L) 36.2(L) 41.6  Platelets 150 - 400 K/uL 80(L) 73(L) 72(L)   BMP Latest Ref Rng & Units 04/22/2021 04/21/2021 04/21/2021  Glucose 70 - 99 mg/dL 04/23/2021) - 967(E)  BUN 8 - 23 mg/dL 12 - 12  Creatinine 938(B - 1.24 mg/dL 0.17 5.10) 2.58(N  Sodium 135 - 145 mmol/L 137 - 136  Potassium 3.5 - 5.1 mmol/L 4.2 - 4.0  Chloride 98 - 111 mmol/L 105 - 104  CO2 22 - 32 mmol/L 23 - 24  Calcium 8.9 - 10.3 mg/dL 8.3(L) - 8.7(L)   Intake/Output      07/26 0701 07/27 0700 07/27 0701 07/28 0700   P.O. 0    I.V. (mL/kg) 1200 (12)    IV Piggyback 600    Total Intake(mL/kg) 1800 (18)    Urine (mL/kg/hr) 1150    Blood 600    Total Output 1750    Net +50            Physical Exam: General: NAD. Laying in bed. Calm, comfortable Resp: No increased wob Cardio: regular rate and rhythm ABD soft Neurologically intact MSK Neurovascularly intact Sensation intact distally Intact pulses distally Dorsiflexion/Plantar flexion intact Incision: dressing C/D/I, ecchymosis surrounding bandage, some induration   Assessment: 1 Day Post-Op  S/P Procedure(s) (LRB): LEFT TOTAL HIP ARTHROPLASTY ANTERIOR APPROACH (Left) by Dr. 8/28. Murphy on 04/21/21  Active Problems:    Fracture of femoral neck, left, closed (HCC)   Femoral neck fracture (HCC)   Plan: - Need to stress the importance of cutting back on smoking and/or smoking cessation and how this can affect healing as part of his driving force for wanting to go home is so he can smoke. Declined a nicotine patch when I offered it to him Advance diet Up with therapy Incentive Spirometry Elevate and Apply ice  Weightbearing: WBAT LLE Insicional and dressing care: Dressings left intact until follow-up and Reinforce dressings as needed Orthopedic device(s): None Showering: POD 3, Keep dressing dry VTE prophylaxis: Lovenox 40mg  qd  while inpatient, can switch to ASA 81mg  bid x 30 days upon d/c , SCDs, ambulation Pain control: Tramadol q6h, Lyrica tid; Tylenol, Oxy, Dilaudid all PRN  Follow - up plan: 2 weeks post op in the office Contact information:  04/23/21 MD, PA-C  Dispo:  TBD based on PT/OT evals . Doing well and eager to go home. So far doing ok mobilization wise. Needs some more work. Would need to set up OPPT if he goes home vs. a SNF    Office [phone] 04/22/2021, 12:46  PM  

## 2021-04-22 NOTE — Evaluation (Signed)
Physical Therapy Evaluation Patient Details Name: Cory Foster. MRN: [id number] DOB: 03/13/1956 Today's Date: 04/22/2021   History of Present Illness  65 yo male presents to [facility] on 7/26 with fall in garden, sustain L femoral neck fx. s/p L DA-THA on 7/26. PMH includes DDD, HTN, HLD, substance abuse, anxiety, depression.  Clinical Impression  Pt presents with post-operative hip pain, impaired strength post-operatively, increased time/effort to perform mobility tasks, impulsivity with mobility, and decreased activity tolerance vs baseline. Pt to benefit from acute PT to address deficits. Pt ambulated hallway distance with RW and light assist for steadying and safety. Pt eager to d/c home, need to practice steps and lessen assist level prior to d/c. PT to progress mobility as tolerated, and will continue to follow acutely.      Follow Up Recommendations Follow surgeon's recommendation for DC plan and follow-up therapies;Supervision for mobility/OOB (OPPT)    Equipment Recommendations  Crutches (pt planning on trying crutches tomorrow)    Recommendations for Other Services       Precautions / Restrictions Precautions Precautions: Fall Restrictions Weight Bearing Restrictions: No Other Position/Activity Restrictions: WBAT      Mobility  Bed Mobility Overal bed mobility: Needs Assistance Bed Mobility: Supine to Sit     Supine to sit: Min guard;HOB elevated     General bed mobility comments: for safety, moves quickly    Transfers Overall transfer level: Needs assistance Equipment used: Rolling walker (2 wheeled) Transfers: Sit to/from Stand Sit to Stand: Min assist;From elevated surface         General transfer comment: min assist for initial power up and rise, verbal cuing for hand placement when rising/sitting as well as slow eccentric lower when moving stand>sit (not followed).  Ambulation/Gait Ambulation/Gait assistance: Min assist Gait Distance  (Feet): 125 Feet Assistive device: Rolling walker (2 wheeled) Gait Pattern/deviations: Step-through pattern;Decreased stride length;Antalgic;Trunk flexed Gait velocity: decr   General Gait Details: min assist to steady, guide RW at times, VC for slowing down, placement in RW.  Stairs            Wheelchair Mobility    Modified Rankin (Stroke Patients Only)       Balance Overall balance assessment: Needs assistance;History of Falls Sitting-balance support: No upper extremity supported;Feet supported Sitting balance-Leahy Scale: Good     Standing balance support: Bilateral upper extremity supported;During functional activity Standing balance-Leahy Scale: Fair Standing balance comment: can stand statically without UE support, requires UE support for dynamic activity                             Pertinent Vitals/Pain Pain Assessment: 0-10 Pain Score: 7  Pain Location: L hip Pain Descriptors / Indicators: Sore;Discomfort;Grimacing Pain Intervention(s): Limited activity within patient's tolerance;Monitored during session;Repositioned;Premedicated before session;Patient requesting pain meds-RN notified    Home Living Family/patient expects to be discharged to:: Private residence Living Arrangements: Non-relatives/Friends (ex-wife of 35 years, get along very well) Available Help at Discharge: Family;Available PRN/intermittently Type of Home: House Home Access: Stairs to enter Entrance Stairs-Rails: Right;Left;Can reach both Entrance Stairs-Number of Steps: 4 Home Layout: Multi-level;1/2 bath on main level;Able to live on main level with bedroom/bathroom Home Equipment: None      Prior Function Level of Independence: Independent               Hand Dominance   Dominant Hand: Right    Extremity/Trunk Assessment   Upper Extremity Assessment Upper Extremity Assessment: Defer to  OT evaluation    Lower Extremity Assessment Lower Extremity Assessment:  Overall WFL for tasks assessed;LLE deficits/detail LLE Deficits / Details: anticipated post-operative weakness; able to perform ankle pumps, quad set, hip abd/ad    Cervical / Trunk Assessment Cervical / Trunk Assessment: Normal  Communication   Communication: No difficulties  Cognition Arousal/Alertness: Awake/alert Behavior During Therapy: WFL for tasks assessed/performed;Impulsive Overall Cognitive Status: Within Functional Limits for tasks assessed                                        General Comments      Exercises Total Joint Exercises Ankle Circles/Pumps: AROM;Both;10 reps;Seated   Assessment/Plan    PT Assessment Patient needs continued PT services  PT Problem List Decreased strength;Decreased mobility;Decreased safety awareness;Decreased activity tolerance;Decreased balance;Decreased knowledge of use of DME;Pain;Decreased knowledge of precautions       PT Treatment Interventions DME instruction;Therapeutic activities;Gait training;Therapeutic exercise;Patient/family education;Balance training;Stair training;Functional mobility training;Neuromuscular re-education    PT Goals (Current goals can be found in the Care Plan section)  Acute Rehab PT Goals Patient Stated Goal: go home and get me a cigarette PT Goal Formulation: With patient Time For Goal Achievement: 04/29/21 Potential to Achieve Goals: Good    Frequency Min 5X/week   Barriers to discharge        Co-evaluation PT/OT/SLP Co-Evaluation/Treatment:  (PT OT dovetail)             AM-PAC PT "6 Clicks" Mobility  Outcome Measure Help needed turning from your back to your side while in a flat bed without using bedrails?: None Help needed moving from lying on your back to sitting on the side of a flat bed without using bedrails?: A Little Help needed moving to and from a bed to a chair (including a wheelchair)?: A Little Help needed standing up from a chair using your arms (e.g.,  wheelchair or bedside chair)?: A Little Help needed to walk in hospital room?: A Little Help needed climbing 3-5 steps with a railing? : A Lot 6 Click Score: 18    End of Session   Activity Tolerance: Patient tolerated treatment well Patient left: in chair;with call bell/phone within reach;with chair alarm set Nurse Communication: Mobility status;Patient requests pain meds PT Visit Diagnosis: Other abnormalities of gait and mobility (R26.89);Difficulty in walking, not elsewhere classified (R26.2)    Time: 4628-6381 PT Time Calculation (min) (ACUTE ONLY): 13 min   Charges:   PT Evaluation $PT Eval Low Complexity: 1 Low         Paras Kreider S, PT DPT Acute Rehabilitation Services Pager [phone]  Office [phone]   Truddie Coco 04/22/2021, 3:54 PM

## 2021-04-22 NOTE — TOC CAGE-AID Note (Signed)
Transition of Care [facility]) - CAGE-AID Screening   Patient Details  Name: Hilmar Moldovan. MRN: [id number] Date of Birth: 01-03-1956  Transition of [facility]) CM/SW Contact:    Ladarrius Bogdanski C Tarpley-Carter, LCSWA Phone Number: 04/22/2021, 2:09 PM   Clinical Narrative: Pt participated in Cage-Aid.  Pt stated he does not use substance or ETOH. Pt was not offered resources, due to no substance or ETOH use.    Tyquarius Paglia Tarpley-Carter, MSW, LCSW-A Pronouns:  She/Her/Hers [facility] HealthTransitions of Care Clinical Social Worker Direct Number:  [phone] [email or web address]   CAGE-AID Screening:    Have You Ever Felt You Ought to Cut Down on Your Drinking or Drug Use?: No Have People Annoyed You By [employer] Your Drinking Or Drug Use?: No Have You Felt Bad Or Guilty About Your Drinking Or Drug Use?: No Have You Ever Had a Drink or Used Drugs First Thing In The Morning to Steady Your Nerves or to Get Rid of a Hangover?: No CAGE-AID Score: 0  Substance Abuse Education Offered: No

## 2021-04-22 NOTE — Progress Notes (Signed)
Subjective:  Cory Foster was evaluated and examined at the bedside this morning. He states he is sore from his surgery yesterday, but he reports the surgery went well and he has not had any complications. He does desire for his roommate to be updated on his status at the hospital, and he looks forward to regaining his strength and mobility and returning home.  He does not report any history of steroid use for treatment of his psoriatic arthritis. He reports taking hydrocodone for psoriatic arthritis pain and Humira for his psoriasis.  Objective:  Vital signs in last 24 hours: Vitals:   04/21/21 1857 04/21/21 2255 04/22/21 0327 04/22/21 0747  BP: 102/71 92/60 (!) 87/57 (!) 102/56  Pulse: 75 84 83 84  Resp: 17 16 14 14   Temp: 97.9 F (36.6 C) 97.7 F (36.5 C) 98 F (36.7 C) 98.7 F (37.1 C)  TempSrc: Oral Oral Oral Oral  SpO2: 92% 96% 95% 95%  Weight:      Height:       Weight change:   Intake/Output Summary (Last 24 hours) at 04/22/2021 1055 Last data filed at 04/22/2021 04/24/2021 Gross per 24 hour  Intake 1800 ml  Output 1750 ml  Net 50 ml   Physical Exam: Constitutional: well-appearing and sitting in bed comfortably, in no acute distress Cardiovascular: regular rate and rhythm, no m/r/g Pulmonary/Chest: normal work of breathing on room air Abdominal: soft, non-tender, non-distended, bowel sounds present MSK: dressing on the left hip with ecchymoses consistent with left THA  Neurological: alert answering questions appropriately  Pertinent Labs: CBC    Component Value Date/Time   WBC 9.7 04/22/2021 0440   RBC 3.46 (L) 04/22/2021 0440   HGB 11.3 (L) 04/22/2021 0440   HGB 13.9 10/17/2013 1110   HCT 32.5 (L) 04/22/2021 0440   HCT 41.5 10/17/2013 1110   PLT 80 (L) 04/22/2021 0440   PLT 135 (L) 10/17/2013 1110   MCV 93.9 04/22/2021 0440   MCV 107 (H) 10/17/2013 1110   MCH 32.7 04/22/2021 0440   MCHC 34.8 04/22/2021 0440   RDW 13.2 04/22/2021 0440   RDW 19.6 (H)  10/17/2013 1110   LYMPHSABS 0.9 04/21/2021 1216   LYMPHSABS 1.9 10/16/2013 1818   MONOABS 0.7 04/21/2021 1216   MONOABS 0.6 10/16/2013 1818   EOSABS 0.1 04/21/2021 1216   EOSABS 0.1 10/16/2013 1818   BASOSABS 0.0 04/21/2021 1216   BASOSABS 0.1 10/16/2013 1818   CMP     Component Value Date/Time   NA 137 04/22/2021 0139   NA 140 10/17/2013 1110   K 4.2 04/22/2021 0139   K 3.3 (L) 10/17/2013 1110   CL 105 04/22/2021 0139   CL 105 10/17/2013 1110   CO2 23 04/22/2021 0139   CO2 29 10/17/2013 1110   GLUCOSE 174 (H) 04/22/2021 0139   GLUCOSE 100 (H) 10/17/2013 1110   BUN 12 04/22/2021 0139   BUN 6 (L) 10/17/2013 1110   CREATININE 1.24 04/22/2021 0139   CREATININE 0.79 10/17/2013 1110   CALCIUM 8.3 (L) 04/22/2021 0139   CALCIUM 7.5 (L) 10/17/2013 1110   PROT 5.6 (L) 04/22/2021 0139   PROT 6.5 10/17/2013 1110   ALBUMIN 3.1 (L) 04/22/2021 0139   ALBUMIN 2.7 (L) 10/17/2013 1110   AST 20 04/22/2021 0139   AST 35 10/17/2013 1110   ALT 13 04/22/2021 0139   ALT 27 10/17/2013 1110   ALKPHOS 51 04/22/2021 0139   ALKPHOS 96 10/17/2013 1110   BILITOT 1.6 (H) 04/22/2021 0139  BILITOT 0.5 10/17/2013 1110   GFRNONAA >60 04/22/2021 0139   GFRNONAA >60 10/17/2013 1110   GFRAA >60 11/10/2018 0037   GFRAA >60 10/17/2013 1110   Lipid Panel     Component Value Date/Time   CHOL 75 04/22/2021 0139   TRIG 63 04/22/2021 0139   HDL 29 (L) 04/22/2021 0139   CHOLHDL 2.6 04/22/2021 0139   VLDL 13 04/22/2021 0139   LDLCALC 33 04/22/2021 0139   Assessment/Plan:  Principal Problem:   Fracture of femoral neck, left, closed, status post left THA (04/21/21) Active Problems:   Thrombocytopenia   Diabetes Mellitus   Hyperlipidemia  Patient Summary: Cory Foster is a 65 y.o. living with psoriatic arthritis, DM, and HLD who presented with left hip pain after a fall on 04/21/21 and was admitted for management of a closed left femoral neck fracture, for which he is status post left THA on  04/21/21.  Fracture of femoral neck, left, closed, status post left THA (04/21/21) Patient presented with acute left hip pain after a fall and imaging consistent with closed left femoral neck fracture with varus angulation. Orthopedic surgery is following and performed surgical intervention with a left THA. He is sore in his left hip today. This fall from a standing height resulting in a fragility fracture is diagnostic for osteoporosis. He will need outpatient follow up to begin appropriate medication regimen for treatment of osteoporosis. Potential risk factors for osteoporosis include history of substance use disorder (alcohol, possible opioid) and potential use of steroids for psoriatic arthritis. -Vitamin D level pending  -Pain control:             -acetaminophen 1000 mg every 6 hours prn             -dilaudid 0.5 mg every 4 hours prn  -oxycodone 5-10 mg every 4 hours prn  -tramadol 50 mg every 6 hours prn -PT/OT consulted and will appreciate their recommendations -Recommendations per ortho:             -post op weight bearing as tolerated             -reinforce dressing as needed and keep dry, showering POD 3             -VTE prophylaxis post-op with lovenox 40 mg qd while inpatient             -Switch lovenox to ASA 81 mg bid x 30 days at discharge             -follow up with ortho in 2 weeks -TOC team consulted to arrange PCP follow up  Cirrhosis Thrombocytopenia Platelet count of 80 today up from 72 on admission. No obvious sources of bleeding at this time. Reticulocyte count and blood smear within normal limits. Patient with a history of imaging and lab studied indicative of cirrhosis, and this is accompanied by a history of alcohol withdrawal and substance use disorder. Potential for alcoholic cirrhosis as underlying cause of thrombocytopenia. This could also explain albumin of 3.1, T Bili of 1.6, and PT/INR of 16.6 and 1.3 respectively. MELD score 13 and Child-Pugh score of 6 (Class  A). -urine drug screen still pending -Outpatient EGD could be beneficial for screening purposes   Diabetes Mellitus Hyperlipidemia Pt with multiple mildly elevated blood glucose readings in the setting of DM and inconsistent metformin adherence. Most recent A1c 5.7 one year ago. He reports he is not taking his atorvastatin regularly but LDL is 33. Patient reports no primary care  physician since moving to the area. He will likely benefit from consistent PCP follow up. -A1c pending -Continue CBG monitoring each morning -consult TOC team for PCP follow up   Code: Full   LOS: 1 day   Val Eagle, Medical Student 04/22/2021, 10:55 AM

## 2021-04-22 NOTE — Progress Notes (Signed)
OT Cancellation Note  Patient Details Name: Cory Foster. MRN: [id number] DOB: 1956-06-27   Cancelled Treatment:    Reason Eval/Treat Not Completed: Pain limiting ability to participate (Provided ice for L hip and notified RN for pain meds.)  Evern Bio 04/22/2021, 2:05 PM Martie Round, OTR/L Acute Rehabilitation Services Pager: [phone] Office: [phone]

## 2021-04-22 NOTE — Plan of Care (Signed)
  Problem: Education: Goal: Verbalization of understanding the information provided (i.e., activity precautions, restrictions, etc) will improve Outcome: Progressing   Problem: Activity: Goal: Ability to ambulate and perform ADLs will improve Outcome: Progressing   Problem: Pain Management: Goal: Pain level will decrease Outcome: Progressing   

## 2021-04-22 NOTE — Anesthesia Postprocedure Evaluation (Signed)
Anesthesia Post Note  Patient: Cory Foster.  Procedure(s) Performed: LEFT TOTAL HIP ARTHROPLASTY ANTERIOR APPROACH (Left: Hip)     Patient location during evaluation: PACU Anesthesia Type: General Level of consciousness: awake and alert and oriented Pain management: pain level controlled Vital Signs Assessment: post-procedure vital signs reviewed and stable Respiratory status: spontaneous breathing, nonlabored ventilation and respiratory function stable Cardiovascular status: blood pressure returned to baseline and stable Postop Assessment: no apparent nausea or vomiting Anesthetic complications: no   No notable events documented.                 Yousef Huge A.

## 2021-04-23 ENCOUNTER — Other Ambulatory Visit (HOSPITAL_COMMUNITY): Payer: Self-pay

## 2021-04-23 DIAGNOSIS — K7469 Other cirrhosis of liver: Secondary | ICD-10-CM

## 2021-04-23 DIAGNOSIS — E119 Type 2 diabetes mellitus without complications: Secondary | ICD-10-CM

## 2021-04-23 LAB — GLUCOSE, CAPILLARY: Glucose-Capillary: 139 mg/dL — ABNORMAL HIGH (ref 70–99)

## 2021-04-23 LAB — PATHOLOGIST SMEAR REVIEW

## 2021-04-23 MED ORDER — ASPIRIN 81 MG PO TBEC
81.0000 mg | DELAYED_RELEASE_TABLET | Freq: Two times a day (BID) | ORAL | 0 refills | Status: AC
  Filled 2021-04-23: qty 60, 30d supply, fill #0

## 2021-04-23 MED ORDER — METHOCARBAMOL 750 MG PO TABS
750.0000 mg | ORAL_TABLET | Freq: Three times a day (TID) | ORAL | 0 refills | Status: AC | PRN
  Filled 2021-04-23: qty 20, 7d supply, fill #0

## 2021-04-23 MED ORDER — VITAMIN D 25 MCG (1000 UNIT) PO TABS
1000.0000 [IU] | ORAL_TABLET | Freq: Every day | ORAL | Status: DC
  Administered 2021-04-23: 1000 [IU] via ORAL
  Filled 2021-04-23: qty 1

## 2021-04-23 MED ORDER — RIVAROXABAN 10 MG PO TABS
10.0000 mg | ORAL_TABLET | Freq: Every day | ORAL | 0 refills | Status: DC
  Filled 2021-04-23: qty 29, 29d supply, fill #0

## 2021-04-23 MED ORDER — OMEPRAZOLE 20 MG PO CPDR
20.0000 mg | DELAYED_RELEASE_CAPSULE | Freq: Every day | ORAL | 0 refills | Status: AC
  Filled 2021-04-23: qty 30, 30d supply, fill #0

## 2021-04-23 MED ORDER — ACETAMINOPHEN 500 MG PO TABS: 1000.0000 mg | ORAL_TABLET | Freq: Three times a day (TID) | ORAL | Status: DC

## 2021-04-23 MED ORDER — ACETAMINOPHEN 500 MG PO TABS
500.0000 mg | ORAL_TABLET | Freq: Four times a day (QID) | ORAL | 0 refills | Status: AC | PRN
  Filled 2021-04-23: qty 60, 15d supply, fill #0

## 2021-04-23 NOTE — Plan of Care (Signed)
  Problem: Activity: Goal: Ability to ambulate and perform ADLs will improve Outcome: Progressing   Problem: Clinical Measurements: Goal: Postoperative complications will be avoided or minimized Outcome: Progressing   Problem: Self-Concept: Goal: Ability to maintain and perform role responsibilities to the fullest extent possible will improve Outcome: Progressing   Problem: Pain Management: Goal: Pain level will decrease Outcome: Progressing   

## 2021-04-23 NOTE — Progress Notes (Addendum)
    Subjective: Patient reports pain as mild to moderate. Well controlled with medicines. Sleeping well. Tolerating diet.  Urinating. No CP, SOB. Mobilized with PT/OT today OOB and did much better than yesterday. States he feels more comfortable and confident now.   Objective:   VITALS:   Vitals:   04/22/21 0747 04/22/21 1440 04/22/21 2022 04/23/21 0730  BP: (!) 102/56 106/63 92/61 102/68  Pulse: 84 70 68 62  Resp: 14 16 18 14   Temp: 98.7 F (37.1 C) 98.3 F (36.8 C) 98.5 F (36.9 C) 97.7 F (36.5 C)  TempSrc: Oral Oral Oral Oral  SpO2: 95% 92% 94% 97%  Weight:      Height:       CBC Latest Ref Rng & Units 04/22/2021 04/21/2021 04/21/2021  WBC 4.0 - 10.5 K/uL 9.7 8.1 6.1  Hemoglobin 13.0 - 17.0 g/dL 11.3(L) 12.4(L) 14.1  Hematocrit 39.0 - 52.0 % 32.5(L) 36.2(L) 41.6  Platelets 150 - 400 K/uL 80(L) 73(L) 72(L)   BMP Latest Ref Rng & Units 04/22/2021 04/21/2021 04/21/2021  Glucose 70 - 99 mg/dL 04/23/2021) - 696(E)  BUN 8 - 23 mg/dL 12 - 12  Creatinine 952(W - 1.24 mg/dL 4.13 2.44) 0.10(U  Sodium 135 - 145 mmol/L 137 - 136  Potassium 3.5 - 5.1 mmol/L 4.2 - 4.0  Chloride 98 - 111 mmol/L 105 - 104  CO2 22 - 32 mmol/L 23 - 24  Calcium 8.9 - 10.3 mg/dL 8.3(L) - 8.7(L)   Intake/Output      07/27 0701 07/28 0700 07/28 0701 07/29 0700   P.O. 120 240   I.V. (mL/kg) 0 (0) 6 (0.1)   IV Piggyback 0    Total Intake(mL/kg) 120 (1.2) 246 (2.5)   Urine (mL/kg/hr) 1500 (0.6) 300 (0.4)   Blood     Total Output 1500 300   Net -1380 -54           Physical Exam: General: NAD. Sitting up in bed with street clothes on. Reports he is ready to go home.  Resp: No increased wob Cardio: regular rate and rhythm ABD soft Neurologically intact MSK Neurovascularly intact Sensation intact distally Intact pulses distally Dorsiflexion/Plantar flexion intact Incision: dressing C/D/I, various areas of ecchymosis around the dressing   Assessment: 2 Days Post-Op  S/P Procedure(s) (LRB): LEFT  TOTAL HIP ARTHROPLASTY ANTERIOR APPROACH (Left) by Dr. 8/29. Murphy on 04/21/21  Active Problems:   Fracture of femoral neck, left, closed (HCC)   Femoral neck fracture (HCC)   Plan: Notes indicate this patient has been mobilizing with crutches however we usually do not use those for total joint patients. We have them mobilize with a rolling walker first and in a week or 2 they progress to a cane.  Continue to encourage smoking cessation. Advance diet Up with therapy Incentive Spirometry Elevate and Apply ice  Weightbearing: WBAT LLE Insicional and dressing care: Dressings left intact until follow-up and Reinforce dressings as needed Orthopedic device(s): None Showering: POD 3, Keep dressing dry VTE prophylaxis: Aspirin 81mg  BID  x 30 days , SCDs, ambulation Pain control: Tylenol, Lyrica, Oxy Follow - up plan: 2 weeks in the office Contact information:  04/23/21 MD, PA-C  Dispo: Home today. Will need OPPT. Rolling walker has been ordered. Medications sent to pharmacy     Cory Foster, [place] [facility] Office [phone] 04/23/2021, 3:02 PM

## 2021-04-23 NOTE — Progress Notes (Addendum)
Provided discharge education/instructions, all questions and concerns addressed, Pt not in distress. Crutches delivered to room by ortho tech. Pt to discharge home with belongings accompanied by family.   1732 RW delivered to room.

## 2021-04-23 NOTE — Progress Notes (Signed)
Orthopedic Tech Progress Note Patient Details:  Cory Foster 08-24-1956 [id number]  Ortho Devices Type of Ortho Device: Crutches Ortho Device/Splint Interventions: Adjustment, Ordered      Cory Foster A Cory Foster 04/23/2021, 1:00 PM

## 2021-04-23 NOTE — TOC Transition Note (Signed)
Transition of Care [facility]) - CM/SW Discharge Note   Patient Details  Name: Cory Foster. MRN: [id number] Date of Birth: 1955/10/11  [facility] CM/SW Contact:  Epifanio Lesches, RN Phone Number: 04/23/2021, 1:08 PM   Clinical Narrative:    Patient will DC to: home Anticipated DC date: 04/23/2021 Family notified: yes Transport by: car       - L femoral neck fx. s/p L DA-THA on 7/26  Per MD patient ready for DC today.    RN, patient, and patient's family aware are of DC. Pt agreeable to outpatient PT. No DME needs. Crutches @ bedside. Referral placed for outpatient PT and noted on AVS.    Pt  without Rx med concerns or affordability issues.  Pt to f/u with insurance company in obtaining PCP in network.  RNCM will sign off for now as intervention is no longer needed. Please consult [place] again if new needs arise.   RNCM will sign off for now as intervention is no longer needed. Please consult [place] again if new needs arise.     Barriers to Discharge: No Barriers Identified   Patient Goals and CMS Choice     Choice offered to / list presented to : Patient  Discharge Placement                       Discharge Plan and Services                                     Social Determinants of Health (SDOH) Interventions     Readmission Risk Interventions No flowsheet data found.

## 2021-04-23 NOTE — Discharge Summary (Addendum)
Name: Jacarri Gesner. MRN: [id number] DOB: [birth year] 65 y.o. PCP: Pcp, No  Date of Admission: 04/21/2021  9:25 AM Date of Discharge:  Attending Physician: Gust Rung, DO  Discharge Diagnosis: 1.Femoral neck fracture s/p left THA (04/21/21) Thrombocytopenia Cirrhosis  Diabetes mellitus HLD   Discharge Medications: Allergies as of 04/23/2021       Reactions   Ceclor [cefaclor] Other (See Comments)   Reaction: unknown   Ciprofloxacin Other (See Comments)   Reaction: unknown   Citalopram Other (See Comments)   Reaction: Hyper    Seroquel [quetiapine Fumarate] Other (See Comments)   Reaction: "hung over" feeling        Medication List     STOP taking these medications    chlordiazePOXIDE 10 MG capsule Commonly known as: LIBRIUM   cyclobenzaprine 5 MG tablet Commonly known as: FLEXERIL   nicotine 21 mg/24hr patch Commonly known as: NICODERM CQ - dosed in mg/24 hours   traZODone 50 MG tablet Commonly known as: DESYREL   triamcinolone ointment 0.1 % Commonly known as: KENALOG       TAKE these medications    acetaminophen 500 MG tablet Commonly known as: TYLENOL Take 1 tablet (500 mg total) by mouth every 6 (six) hours as needed for mild pain or moderate pain. DO NOT TAKE MORE THAN  OF ACETAMINOPHEN PER DAY.- HYDROCODONE ALSO HAS ACETAMINOPHEN IN IT.   Adalimumab 40 MG/0.8ML Pnkt Inject 40 mg into the skin every 14 (fourteen) days. Notes to patient: Resume home regimen   Aspirin Low Dose 81 MG EC tablet Generic drug: aspirin Take 1 tablet (81 mg total) by mouth 2 (two) times daily.   fluticasone 50 MCG/ACT nasal spray Commonly known as: FLONASE Place 1 spray into both nostrils daily as needed for allergies. Notes to patient: Resume home regimen   HYDROcodone-acetaminophen 5-325 MG tablet Commonly known as: NORCO/VICODIN Take 1 tablet by mouth 2 (two) times daily as needed for pain. Notes to patient: Resume home regimen    metFORMIN 500 MG tablet Commonly known as: GLUCOPHAGE Take 500 mg by mouth 2 (two) times daily as needed. High CBG Notes to patient: Resume home regimen   methocarbamol 750 MG tablet Commonly known as: Robaxin-750 Take 1 tablet (750 mg total) by mouth every 8 (eight) hours as needed for muscle spasms.   omeprazole 20 MG capsule Commonly known as: PRILOSEC Take 1 capsule (20 mg total) by mouth daily.   oxyCODONE 5 MG immediate release tablet Commonly known as: Roxicodone Take 1 tablet (5 mg total) by mouth every 6 (six) hours as needed for severe pain. Do not take more than 6 tablets in a 24 hour period. Notes to patient: Last dose given 07/28 10:58am   pregabalin 50 MG capsule Commonly known as: LYRICA Take 50 mg by mouth 3 (three) times daily.   sertraline 100 MG tablet Commonly known as: ZOLOFT Take 100 mg by mouth daily as needed for anxiety. What changed: Another medication with the same name was removed. Continue taking this medication, and follow the directions you see here.               Durable Medical Equipment  (From admission, onward)           Start     Ordered   04/23/21 1508  For home use only DME Walker rolling  Once       Question Answer Comment  Walker: With 5 Inch Wheels   Patient needs a walker  to treat with the following condition Weakness      04/23/21 1508   04/23/21 1032  For home use only DME Crutches  Once        04/23/21 1032              Discharge Care Instructions  (From admission, onward)           Start     Ordered   04/23/21 0000  Leave dressing on - Keep it clean, dry, and intact until clinic visit        04/23/21 1231            Disposition and follow-up:   Mr.Naren McDougal Maude LericheCameron Jr. was [facility] in Stable condition.  At the hospital follow up visit please address:  1.  Fracture of femoral neck, left, closed, status post left THA (04/21/21) Patient presented with  acute left hip pain after a fall and imaging consistent with closed left femoral neck fracture with varus angulation. Orthopedic surgery is following and performed surgical intervention with a left THA. This fall from a standing height resulting in a fragility fracture is diagnostic for osteoporosis. He will need outpatient follow up to begin appropriate medication regimen for treatment of osteoporosis. Potential risk factors for osteoporosis include history of substance use disorder (alcohol, possible opioid) and potential use of steroids for psoriatic arthritis. Vitamin D deficiency  -Pain control - tylenol 500 q6 prn, oxycodone 5mg , hydrocodone-acetaminophen 5-325, Robaxin 750 for muscle spasms -Recommendations per ortho:             -post op weight bearing as tolerated, given crutches on DC             -ASA 81 for 30 days postop             -follow up with ortho in 2 weeks    Cirrhosis Thrombocytopenia Platelet count 72 on admission. No obvious sources of bleeding. Reticulocyte count and blood smear within normal limits. Patient with a history of imaging and lab studied indicative of cirrhosis, and history of alcohol withdrawal and substance use disorder. Potential for alcoholic cirrhosis as underlying cause of thrombocytopenia. This could also explain albumin of 3.1, T Bili of 1.6, and PT/INR of 16.6 and 1.3 respectively. MELD score 13 and Child-Pugh score of 6 (Class A). -Outpatient EGD could be beneficial for screening purposes   Diabetes Mellitus Hyperlipidemia Pt with multiple mildly elevated blood glucose readings in the setting of DM and inconsistent metformin adherence. Most recent A1c 5.7 one year ago. He reports he is not taking his atorvastatin regularly but LDL is 33. Patient reports no primary care physician since moving to the area. He will likely benefit from consistent PCP follow up. - home meds  2.  Labs / imaging needed at time of follow-up: cbc, bmp  3.  Pending labs/ test  needing follow-up: none  Follow-up Appointments:  Follow-up Information     Sheral ApleyMurphy, Timothy D, MD. Schedule an appointment as soon as possible for a visit in 2 week(s).   Specialty: Orthopedic Surgery Contact information: [address] [place] [id number] [phone]                 Hospital Course by problem list: 1. Femoral neck fracture s/p THA - patient presented with acute left hip pain after a ground level fall and imaging consistent with closed left femoral neck fracture with varus angulation. Orthopedic surgery performed left THA on  7/26. He was started on pain management postop and lovenox for DVT prophylaxis. Causes for his fragility fracture were investigated and found his Vitamin D level to be low. Vit D supplementation was advised but patient reported that he had been taken off of it in the past by his rheumatologist due to drug interactions with [facility] which he takes for his psoriasis. He remained stable with no evidence of hematoma. PT evaluated him and recommended 3in1 however patient opted instead for crutches. He was switched to aspirin on discharge. He was discharged home with recommendations for home health PT.   Cirrhosis, thrombocytopenia - during initial evaluation in the ED, patient was noted to have low platelets of 72. Fortunately, his low platelets did not impair surgical wound healing. Potential causes were investigated. Prior imaging revealed cirrhosis. It was advised to patient that he discuss scheduling screening EGD with his PCP.  DM and HLD - his chronic medical issues were managed with home medications  Discharge Exam:   BP 102/68 (BP Location: Left Arm)   Pulse 62   Temp 97.7 F (36.5 C) (Oral)   Resp 14   Ht 6\' 2"  (1.88 m)   Wt 99.8 kg   SpO2 97%   BMI 28.25 kg/m  Discharge exam: Physical Exam Constitutional:      Appearance: Normal appearance. He is normal weight.  HENT:     Head: Normocephalic and atraumatic.   Cardiovascular:     Rate and Rhythm: Normal rate and regular rhythm.  Pulmonary:     Effort: Pulmonary effort is normal.     Breath sounds: Normal breath sounds.  Abdominal:     General: Abdomen is flat.     Palpations: Abdomen is soft.  Musculoskeletal:     Comments: Surgical incision clean dry and intact. Bruising noted along left hip, no obvious hematoma  Neurological:     Mental Status: He is alert and oriented to person, place, and time. Mental status is at baseline.  Psychiatric:        Mood and Affect: Mood normal.        Behavior: Behavior normal.        Thought Content: Thought content normal.        Judgment: Judgment normal.     Pertinent Labs, Studies, and Procedures:  Vitamin D, 25-hydroxy: 21.54  DG pelvis Left femoral neck fracture with varus angulation.  DG femur Left femoral neck fracture with varus angulation  DG chest Negative for acute cardiopulmonary disease  DG c-arm Two intraoperative fluoroscopic images of the left hip from left total hip arthroplasty, as described.  Discharge Instructions: Discharge Instructions     Ambulatory referral to Physical Therapy   Complete by: As directed    Call MD for:  difficulty breathing, headache or visual disturbances   Complete by: As directed    Call MD for:  persistant dizziness or light-headedness   Complete by: As directed    Call MD for:  persistant nausea and vomiting   Complete by: As directed    Call MD for:  redness, tenderness, or signs of infection (pain, swelling, redness, odor or green/yellow discharge around incision site)   Complete by: As directed    Call MD for:  severe uncontrolled pain   Complete by: As directed    Call MD for:  temperature >100.4   Complete by: As directed    Diet - low sodium heart healthy   Complete by: As directed    Increase activity slowly  Complete by: As directed    Leave dressing on - Keep it clean, dry, and intact until clinic visit   Complete by: As  directed       You were admitted to [facility] for a hip fracture. The orthopedic surgeon performed a joint replacement. Please see their recommendations below.  Due to the nature of your hip fracture, it is recommended that you begin Vitamin D supplementation. Please talk with your rheumatologist about starting Vitamin D. Please follow up with a primary care physician in 1 week. Please follow up with the orthopedic surgery clinic Pleas limit/eliminate tobacco use to help with surgical recovery.  Please follow up with primary care physician for possible EGD referral.  You may bear weight as tolerated. Keep your dressing on and dry until follow up. Take medicine to prevent blood clots as directed. Take pain medicine as needed with the goal of transitioning to over the counter medicines.    INSTRUCTIONS AFTER JOINT REPLACEMENT   Remove items at home which could result in a fall. This includes throw rugs or furniture in walking pathways ICE to the affected joint every three hours while awake for 30 minutes at a time, for at least the first 3-5 days, and then as needed for pain and swelling.  Continue to use ice for pain and swelling. You may notice swelling that will progress down to the foot and ankle.  This is normal after surgery.  Elevate your leg when you are not up walking on it.   Continue to use the breathing machine you got in the hospital (incentive spirometer) which will help keep your temperature down.  It is common for your temperature to cycle up and down following surgery, especially at night when you are not up moving around and exerting yourself.  The breathing machine keeps your lungs expanded and your temperature down.   DIET:  As you were doing prior to hospitalization, we recommend a well-balanced diet.  DRESSING / WOUND CARE / SHOWERING  You may shower 3 days after surgery, but keep the wounds dry during showering.  You may use an occlusive plastic  wrap (Press'n Seal for example) with blue painter's tape at edges to cover the wound. NO SOAKING/SUBMERGING IN THE BATHTUB.  If the bandage gets wet, call the office.  ACTIVITY  Increase activity slowly as tolerated, but follow the weight bearing instructions below.   No driving for 6 weeks or until further direction given by your physician.  You cannot drive while taking narcotics.  No lifting or carrying greater than 10 lbs. until further directed by your surgeon. Avoid periods of inactivity such as sitting longer than an hour when not asleep. This helps prevent blood clots.  You may return to work once you are authorized by your doctor.    WEIGHT BEARING   Weight bearing as tolerated with assist device (walker, cane, etc) as directed, use it as long as suggested by your surgeon or therapist, typically at least 4-6 weeks.   EXERCISES  Results after joint replacement surgery are often greatly improved when you follow the exercise, range of motion and muscle strengthening exercises prescribed by your doctor. Safety measures are also important to protect the joint from further injury. Any time any of these exercises cause you to have increased pain or swelling, decrease what you are doing until you are comfortable again and then slowly increase them. If you have problems or questions, call your caregiver or physical therapist for  advice.   Rehabilitation is important following a joint replacement. After just a few days of immobilization, the muscles of the leg can become weakened and shrink (atrophy).  These exercises are designed to build up the tone and strength of the thigh and leg muscles and to improve motion. Often times heat used for twenty to thirty minutes before working out will loosen up your tissues and help with improving the range of motion but do not use heat for the first two weeks following surgery (sometimes heat can increase post-operative swelling).   These exercises can be  done on a training (exercise) mat, on the floor, on a table or on a bed. Use whatever works the best and is most comfortable for you.    Use music or television while you are exercising so that the exercises are a pleasant break in your day. This will make your life better with the exercises acting as a break in your routine that you can look forward to.   Perform all exercises about fifteen times, three times per day or as directed.  You should exercise both the operative leg and the other leg as well.  Exercises include:   Quad Sets - Tighten up the muscle on the front of the thigh (Quad) and hold for 5-10 seconds.   Straight Leg Raises - With your knee straight (if you were given a brace, keep it on), lift the leg to 60 degrees, hold for 3 seconds, and slowly lower the leg.  Perform this exercise against resistance later as your leg gets stronger.  Leg Slides: Lying on your back, slowly slide your foot toward your buttocks, bending your knee up off the floor (only go as far as is comfortable). Then slowly slide your foot back down until your leg is flat on the floor again.  Angel Wings: Lying on your back spread your legs to the side as far apart as you can without causing discomfort.  Hamstring Strength:  Lying on your back, push your heel against the floor with your leg straight by tightening up the muscles of your buttocks.  Repeat, but this time bend your knee to a comfortable angle, and push your heel against the floor.  You may put a pillow under the heel to make it more comfortable if necessary.   A rehabilitation program following joint replacement surgery can speed recovery and prevent re-injury in the future due to weakened muscles. Contact your doctor or a physical therapist for more information on knee rehabilitation.    CONSTIPATION  Constipation is defined medically as fewer than three stools per week and severe constipation as less than one stool per week.  Even if you have a regular  bowel pattern at home, your normal regimen is likely to be disrupted due to multiple reasons following surgery.  Combination of anesthesia, postoperative narcotics, change in appetite and fluid intake all can affect your bowels.   YOU MUST use at least one of the following options; they are listed in order of increasing strength to get the job done.  They are all available over the counter, and you may need to use some, POSSIBLY even all of these options:    Drink plenty of fluids (prune juice may be helpful) and high fiber foods Colace 100 mg by mouth twice a day  Senokot for constipation as directed and as needed Dulcolax (bisacodyl), take with full glass of water  Miralax (polyethylene glycol) once or twice a day as needed.  If you have tried all these things and are unable to have a bowel movement in the first 3-4 days after surgery call either your surgeon or your primary doctor.    If you experience loose stools or diarrhea, hold the medications until you stool forms back up.  If your symptoms do not get better within 1 week or if they get worse, check with your doctor.  If you experience "the worst abdominal pain ever" or develop nausea or vomiting, please contact the office immediately for further recommendations for treatment.   ITCHING:  If you experience itching with your medications, try taking only a single pain pill, or even half a pain pill at a time.  You can also use Benadryl over the counter for itching or also to help with sleep.   TED HOSE STOCKINGS:  Use stockings on both legs until for at least 2 weeks or as directed by physician office. They may be removed at night for sleeping.  MEDICATIONS:  See your medication summary on the "After Visit Summary" that nursing will review with you.  You may have some home medications which will be placed on hold until you complete the course of blood thinner medication.  It is important for you to complete the blood thinner medication as  prescribed.  Take medicines as prescribed.   You have several different medicines that work in different ways. - Tylenol is for mild to moderate pain. Try to take this medicine before turning to your narcotic medicines.  - Meloxicam is to reduce pain / inflammation - Robaxin is for muscle spasms - Oxycodone is a narcotic pain medicine.  Take this for severe pain. This medicine can be dehydrating / constipating. - Zofran is for nausea and vomiting. - Omeprazole is for gastric protection while taking pain medicines.  - Aspirin is to prevent blood clots after surgery.   PRECAUTIONS:  If you experience chest pain or shortness of breath - call 911 immediately for transfer to the hospital emergency department.   If you develop a fever greater that 101 F, purulent drainage from wound, increased redness or drainage from wound, foul odor from the wound/dressing, or calf pain - CONTACT YOUR SURGEON.                                                   FOLLOW-UP APPOINTMENTS:  If you do not already have a post-op appointment, please call the office [phone] for an appointment to be seen by Dr. Eulah Pont in 2 weeks.   OTHER INSTRUCTIONS:   MAKE SURE YOU:  Understand these instructions.  Get help right away if you are not doing well or get worse.    Thank you for letting us be a part of your medical care team.  It is a privilege we respect greatly.  We hope these instructions will help you stay on track for a fast and full recovery!    POST-OPERATIVE OPIOID TAPER INSTRUCTIONS: It is important to wean off of your opioid medication as soon as possible. If you do not need pain medication after your surgery it is ok to stop day one. Opioids include: Codeine, Hydrocodone(Norco, Vicodin), Oxycodone(Percocet, oxycontin) and hydromorphone amongst others.  Long term and even short term use of opiods can cause: Increased pain response Dependence Constipation Depression Respiratory depression And more.   Withdrawal symptoms  can include Flu like symptoms Nausea, vomiting And more Techniques to manage these symptoms Hydrate well Eat regular healthy meals Stay active Use relaxation techniques(deep breathing, meditating, yoga) Do Not substitute Alcohol to help with tapering If you have been on opioids for less than two weeks and do not have pain than it is ok to stop all together.  Plan to wean off of opioids This plan should start within one week post op of your joint replacement. Maintain the same interval or time between taking each dose and first decrease the dose.  Cut the total daily intake of opioids by one tablet each day Next start to increase the time between doses. The last dose that should be eliminated is the evening dose.    Signed: Adron Bene, MD 04/23/2021, 3:38 PM   Pager: @[phone] @

## 2021-04-23 NOTE — Progress Notes (Signed)
Physical Therapy Treatment Patient Details Name: Cory Foster. MRN: [id number] DOB: 1956/05/28 Today's Date: 04/23/2021    History of Present Illness 66 yo male presents to [facility] on 7/26 with fall in garden, sustain L femoral neck fx. s/p L DA-THA on 7/26. PMH includes DDD, HTN, HLD, substance abuse, anxiety, depression.    PT Comments    Pt. Demos improved tolerance to functional mobility, able to amb inc distance today with use of crutches and supervision.  Pt. Also tolerates initiation of stair training this tx session.  Pt. Demos good balance with activity, but needs frequent reminders for appropriate sequencing with use of AD during gait and with stair training. Would benefit from continued skilled PT to address remaining deficits.  Plan for home with [facility].   Follow Up Recommendations  Follow surgeon's recommendation for DC plan and follow-up therapies;Supervision for mobility/OOB (OPPT)     Equipment Recommendations  Crutches (pt planning on trying crutches tomorrow)    Recommendations for Other Services       Precautions / Restrictions Precautions Precautions: Fall Restrictions Weight Bearing Restrictions: Yes LLE Weight Bearing: Weight bearing as tolerated Other Position/Activity Restrictions: WBAT    Mobility  Bed Mobility Overal bed mobility: Independent Bed Mobility: Supine to Sit     Supine to sit: Independent     General bed mobility comments: Up in recliner on entry Patient Response: Cooperative  Transfers Overall transfer level: Needs assistance Equipment used: Crutches Transfers: Sit to/from Stand Sit to Stand: Min guard         General transfer comment: Pt. requests to trial crutches today.  PT educates/demos proper transfer with use of crutches and pt. demos understanding.  Performs sit > stand with CGA and VCs for safety with hand placement.  PT recommends adjusting handle on crutches for better fit, but pt. refuses at this time, states  "it feels fine".  Demos good static standing balance with use of crutches.  Ambulation/Gait Ambulation/Gait assistance: Supervision Gait Distance (Feet): 475 Feet Assistive device: Crutches Gait Pattern/deviations: WFL(Within Functional Limits)     General Gait Details: Pt. demos inc VCs for appropriate gait sequencing with use of crutches.  Pt. is ready to progress to step-through gait pattern, but demos difficulty with coordinating crutches and often leaves crutches behind while taking forward steps. PT explains use of RW req's less coordination and  will allow pt. to progress gait speed/step-through gait with less difficulty, however, pt. refuses.  He states he prefers crtuches for stair negotiation. Pt. demos improved coordination with practice.  Educated to avoid inc step length on R LE resulting in L LE hyperextension due to stress on incision site.  Demos understanding.  Pt. able to amb to gym and up and down length of hall with supervision only for sequencing, no LOB.   Stairs Stairs: Yes Stairs assistance: Supervision Stair Management: One rail Right;One rail Left;With crutches Number of Stairs: 4 General stair comments: Pt. practices on small stair case in gym using one handrail and one crutch.  Pt. educated on appropriate sequencing, but demos poor carryover and needs frequent reminders to lead with appropriate LE.   Wheelchair Mobility    Modified Rankin (Stroke Patients Only)       Balance Overall balance assessment: Modified Independent   Sitting balance-Leahy Scale: Normal       Standing balance-Leahy Scale: Fair  Cognition Arousal/Alertness: Awake/alert Behavior During Therapy: WFL for tasks assessed/performed Overall Cognitive Status: Within Functional Limits for tasks assessed                                 General Comments: Pt is impulsive however that could be contributed to his want to be discharged  today.      Exercises Other Exercises Other Exercises: Pt. educated on seated and supine LE therex and provided with handout.  Demos understanding and is able to demo IND with each exercise.  Pt is educated to complete 1 set of therex this morning, and another set in PM.  Pt. also educated to perform any standing therex at home in front of counter for stable surface to hold onto.    General Comments General comments (skin integrity, edema, etc.): VSS on RA, educated on importance of keeping surgical site clean and dry      Pertinent Vitals/Pain Pain Assessment: 0-10 Pain Score: 4  Pain Location: L hip Pain Descriptors / Indicators: Aching Pain Intervention(s): Repositioned    Home Living                      Prior Function            PT Goals (current goals can now be found in the care plan section) Acute Rehab PT Goals Patient Stated Goal: go home PT Goal Formulation: With patient Time For Goal Achievement: 04/29/21 Potential to Achieve Goals: Good Progress towards PT goals: Progressing toward goals    Frequency    Min 5X/week      PT Plan Current plan remains appropriate    Co-evaluation PT/OT/SLP Co-Evaluation/Treatment:  (PT OT dovetail)            AM-PAC PT "6 Clicks" Mobility   Outcome Measure  Help needed turning from your back to your side while in a flat bed without using bedrails?: None Help needed moving from lying on your back to sitting on the side of a flat bed without using bedrails?: A Little Help needed moving to and from a bed to a chair (including a wheelchair)?: A Little Help needed standing up from a chair using your arms (e.g., wheelchair or bedside chair)?: A Little Help needed to walk in hospital room?: A Little Help needed climbing 3-5 steps with a railing? : A Little 6 Click Score: 19    End of Session Equipment Utilized During Treatment: Gait belt;Other (comment) (Axillary crutches) Activity Tolerance: Patient  tolerated treatment well Patient left: in chair;with call bell/phone within reach;with chair alarm set Nurse Communication: Mobility status PT Visit Diagnosis: Other abnormalities of gait and mobility (R26.89);Difficulty in walking, not elsewhere classified (R26.2)     Time: 0930-1003 PT Time Calculation (min) (ACUTE ONLY): 33 min  Charges:  $Gait Training: 8-22 mins $Therapeutic Exercise: 8-22 mins                     Inola Lisle A. Jeanell Mangan, PT, DPT Acute Rehabilitation Services Office: [phone]    Yessika Otte A Selby Slovacek 04/23/2021, 11:13 AM

## 2021-04-23 NOTE — Progress Notes (Signed)
Occupational Therapy Treatment Patient Details Name: Cory Foster. MRN: [id number] DOB: 22-Jun-1956 Today's Date: 04/23/2021    History of present illness 65 yo male presents to [facility] on 7/26 with fall in garden, sustain L femoral neck fx. s/p L DA-THA on 7/26. PMH includes DDD, HTN, HLD, substance abuse, anxiety, depression.   OT comments  Pt met all OT goals this session. He is able to safely ambulate to complete ADL's sitting and standing at mod I level. Pt was educated on compensatory strategies for food prep, carrying food, and other ADL/IADL tasks while using crutches. At this time all pt needs are met and he no longer needs OT services. Acute OT will sign off.    Follow Up Recommendations  No OT follow up;Supervision - Intermittent    Equipment Recommendations  None recommended by OT    Recommendations for Other Services      Precautions / Restrictions Precautions Precautions: Fall Restrictions Weight Bearing Restrictions: No Other Position/Activity Restrictions: WBAT       Mobility Bed Mobility               General bed mobility comments: Up in recliner on entry    Transfers Overall transfer level: Modified independent Equipment used: Crutches Transfers: Sit to/from Stand Sit to Stand: Modified independent (Device/Increase time)         General transfer comment: Mod I from recliner and toilet    Balance Overall balance assessment: Mild deficits observed, not formally tested                                         ADL either performed or assessed with clinical judgement   ADL Overall ADL's : Modified independent     Grooming: Wash/dry hands;Modified independent;Standing Grooming Details (indicate cue type and reason): Completed at sink     Lower Body Bathing: Modified independent;Sitting/lateral leans;Sit to/from stand Lower Body Bathing Details (indicate cue type and reason): simulated sitting and standing from  toilet     Lower Body Dressing: Modified independent;Sitting/lateral leans;Sit to/from stand Lower Body Dressing Details (indicate cue type and reason): Donned and doffed socks Toilet Transfer: Modified Independent;Ambulation Toilet Transfer Details (indicate cue type and reason): Used crutches, completed toileting in bathroom Toileting- Clothing Manipulation and Hygiene: Modified independent;Sitting/lateral lean       Functional mobility during ADLs: Modified independent (Crutches) General ADL Comments: Pt overall at mod I level with all ADL's     Vision   Vision Assessment?: No apparent visual deficits   Perception     Praxis      Cognition Arousal/Alertness: Awake/alert Behavior During Therapy: Impulsive Overall Cognitive Status: Within Functional Limits for tasks assessed                                 General Comments: Pt is impulsive however that could be contributed to his want to be discharged today.        Exercises     Shoulder Instructions       General Comments VSS on RA, educated on importance of keeping surgical site clean and dry    Pertinent Vitals/ Pain       Pain Assessment: 0-10 Pain Score: 4  Pain Location: L hip Pain Descriptors / Indicators: Sore;Discomfort;Grimacing Pain Intervention(s): Monitored during session;Repositioned  Home Living  Prior Functioning/Environment              Frequency  Min 2X/week        Progress Toward Goals  OT Goals(current goals can now be found in the care plan section)  Progress towards OT goals: Goals met/education completed, patient discharged from OT  Acute Rehab OT Goals Patient Stated Goal: To go home Time For Goal Achievement: 05/06/21 Potential to Achieve Goals: Good ADL Goals Pt Will Perform Grooming: with modified independence;standing Pt Will Perform Lower Body Bathing: with modified independence;sit to/from  stand Pt Will Perform Lower Body Dressing: with modified independence;sit to/from stand Pt Will Transfer to Toilet: with modified independence;ambulating Pt Will Perform Toileting - Clothing Manipulation and hygiene: with modified independence;sit to/from stand Pt Will Perform Tub/Shower Transfer: Shower transfer;ambulating;with supervision  Plan Discharge plan remains appropriate;Frequency remains appropriate    Co-evaluation                 AM-PAC OT "6 Clicks" Daily Activity     Outcome Measure   Help from another person eating meals?: None Help from another person taking care of personal grooming?: None Help from another person toileting, which includes using toliet, bedpan, or urinal?: None Help from another person bathing (including washing, rinsing, drying)?: None Help from another person to put on and taking off regular upper body clothing?: None Help from another person to put on and taking off regular lower body clothing?: None 6 Click Score: 24    End of Session Equipment Utilized During Treatment: Rolling walker  OT Visit Diagnosis: Other abnormalities of gait and mobility (R26.89);Unsteadiness on feet (R26.81);Pain   Activity Tolerance Patient tolerated treatment well   Patient Left in chair;with call bell/phone within reach;with chair alarm set   Nurse Communication Patient requests pain meds;Mobility status        Time: 1002-1014 OT Time Calculation (min): 12 min  Charges: OT General Charges $OT Visit: 1 Visit OT Treatments $Self Care/Home Management : 8-22 mins  Oprah Camarena H., OTR/L Acute Rehabilitation   Mitra Duling Elane Yetta Marceaux 04/23/2021, 10:24 AM

## 2021-04-25 NOTE — Addendum Note (Signed)
Addendum  created 04/25/21 0307 by Mal Amabile, MD   Intraprocedure Staff edited

## 2022-04-05 ENCOUNTER — Emergency Department (HOSPITAL_COMMUNITY): Payer: Medicare HMO

## 2022-04-05 ENCOUNTER — Encounter (HOSPITAL_COMMUNITY): Payer: Self-pay

## 2022-04-05 ENCOUNTER — Emergency Department (HOSPITAL_COMMUNITY)
Admission: EM | Admit: 2022-04-05 | Discharge: 2022-04-05 | Discharge status: Against Medical Advice | Payer: Medicare HMO | Attending: Emergency Medicine | Admitting: Emergency Medicine

## 2022-04-05 DIAGNOSIS — R519 Headache, unspecified: Secondary | ICD-10-CM | POA: Diagnosis not present

## 2022-04-05 DIAGNOSIS — M79604 Pain in right leg: Secondary | ICD-10-CM | POA: Diagnosis not present

## 2022-04-05 DIAGNOSIS — M79621 Pain in right upper arm: Secondary | ICD-10-CM | POA: Diagnosis not present

## 2022-04-05 DIAGNOSIS — G8929 Other chronic pain: Secondary | ICD-10-CM | POA: Insufficient documentation

## 2022-04-05 DIAGNOSIS — R2681 Unsteadiness on feet: Secondary | ICD-10-CM | POA: Insufficient documentation

## 2022-04-05 DIAGNOSIS — M25511 Pain in right shoulder: Secondary | ICD-10-CM | POA: Diagnosis not present

## 2022-04-05 DIAGNOSIS — R200 Anesthesia of skin: Secondary | ICD-10-CM | POA: Diagnosis present

## 2022-04-05 DIAGNOSIS — R42 Dizziness and giddiness: Secondary | ICD-10-CM | POA: Insufficient documentation

## 2022-04-05 DIAGNOSIS — F1020 Alcohol dependence, uncomplicated: Secondary | ICD-10-CM | POA: Insufficient documentation

## 2022-04-05 DIAGNOSIS — Z5321 Procedure and treatment not carried out due to patient leaving prior to being seen by health care provider: Secondary | ICD-10-CM | POA: Insufficient documentation

## 2022-04-05 LAB — CBC WITH DIFFERENTIAL/PLATELET
Abs Immature Granulocytes: 0.17 10*3/uL — ABNORMAL HIGH (ref 0.00–0.07)
Basophils Absolute: 0.1 10*3/uL (ref 0.0–0.1)
Basophils Relative: 1 %
Eosinophils Absolute: 0.3 10*3/uL (ref 0.0–0.5)
Eosinophils Relative: 3 %
HCT: 47.2 % (ref 39.0–52.0)
Hemoglobin: 16.6 g/dL (ref 13.0–17.0)
Immature Granulocytes: 2 %
Lymphocytes Relative: 24 %
Lymphs Abs: 2.1 10*3/uL (ref 0.7–4.0)
MCH: 34.1 pg — ABNORMAL HIGH (ref 26.0–34.0)
MCHC: 35.2 g/dL (ref 30.0–36.0)
MCV: 96.9 fL (ref 80.0–100.0)
Monocytes Absolute: 1 10*3/uL (ref 0.1–1.0)
Monocytes Relative: 11 %
Neutro Abs: 5.3 10*3/uL (ref 1.7–7.7)
Neutrophils Relative %: 59 %
Platelets: 120 10*3/uL — ABNORMAL LOW (ref 150–400)
RBC: 4.87 MIL/uL (ref 4.22–5.81)
RDW: 15.7 % — ABNORMAL HIGH (ref 11.5–15.5)
WBC: 8.9 10*3/uL (ref 4.0–10.5)
nRBC: 0 % (ref 0.0–0.2)

## 2022-04-05 LAB — COMPREHENSIVE METABOLIC PANEL
ALT: 37 U/L (ref 0–44)
AST: 26 U/L (ref 15–41)
Albumin: 3.7 g/dL (ref 3.5–5.0)
Alkaline Phosphatase: 51 U/L (ref 38–126)
Anion gap: 8 (ref 5–15)
BUN: 17 mg/dL (ref 8–23)
CO2: 26 mmol/L (ref 22–32)
Calcium: 8.4 mg/dL — ABNORMAL LOW (ref 8.9–10.3)
Chloride: 100 mmol/L (ref 98–111)
Creatinine, Ser: 1.18 mg/dL (ref 0.61–1.24)
GFR, Estimated: >60 mL/min (ref >60)
Glucose, Bld: 212 mg/dL — ABNORMAL HIGH (ref 70–99)
Potassium: 3.8 mmol/L (ref 3.5–5.1)
Sodium: 134 mmol/L — ABNORMAL LOW (ref 135–145)
Total Bilirubin: 1.4 mg/dL — ABNORMAL HIGH (ref 0.3–1.2)
Total Protein: 7 g/dL (ref 6.5–8.1)

## 2022-04-05 LAB — PROTIME-INR
INR: 1.1 (ref 0.8–1.2)
Prothrombin Time: 14 seconds (ref 11.4–15.2)

## 2022-04-05 LAB — ETHANOL: Alcohol, Ethyl (B): <10 mg/dL (ref <10)

## 2022-04-05 NOTE — ED Triage Notes (Signed)
Pt arrived via POV, c/o diziness, feeling faint, pain throughout entire right side of body, starting in head, down to feet for several months.

## 2022-04-05 NOTE — ED Provider Triage Note (Signed)
Emergency Medicine Provider Triage Evaluation Note  Cory Foster. , a 66 y.o. male  was evaluated in triage.  Pt complains of pain in his right side for many months and numbness in his head for 1 week.  He states he has seen doctors previously had back injections, but did not help and "they will not give me any medicine."  He states his balance is off when he walks.  He has a prior left hip fracture that was replaced but still has a numb sensation in it.  He is amatory came in today by private vehicle  Review of Systems  Positive: Headache, pain right arm, back and right leg, difficulty ambulating Negative: Fever, vomiting, dizziness  Physical Exam  BP (!) 145/84 (BP Location: Left Arm)   Pulse 84   Temp 97.7 F (36.5 C) (Oral)   Resp 18   SpO2 100%  Gen:   Awake, no distress   Resp:  Normal effort  MSK:   Moves extremities without difficulty normal range of motion, normal gait Other:  Nontoxic appearance  Medical Decision Making  Medically screening exam initiated at 11:04 AM.  Appropriate orders placed.  Cory Foster. was informed that the remainder of the evaluation will be completed by another provider, this initial triage assessment does not replace that evaluation, and the importance of remaining in the ED until their evaluation is complete.  Chronic pain, balance disorder and history of alcohol dependence, depression, anxiety, degenerative joint disease and psoriasis.   Cory Bale, MD 04/05/22 1110

## 2022-06-10 LAB — EXTERNAL GENERIC LAB PROCEDURE: COLOGUARD: POSITIVE — AB

## 2023-04-18 ENCOUNTER — Other Ambulatory Visit: Payer: Self-pay | Admitting: Otolaryngology

## 2023-04-18 DIAGNOSIS — J329 Chronic sinusitis, unspecified: Secondary | ICD-10-CM

## 2023-05-06 ENCOUNTER — Ambulatory Visit
Admission: RE | Admit: 2023-05-06 | Discharge: 2023-05-06 | Disposition: A | Discharge status: Home / Self Care | Payer: Medicare HMO | Source: Ambulatory Visit | Attending: Otolaryngology | Admitting: Otolaryngology

## 2023-05-06 DIAGNOSIS — J329 Chronic sinusitis, unspecified: Secondary | ICD-10-CM

## 2023-05-18 ENCOUNTER — Other Ambulatory Visit: Payer: Self-pay | Admitting: Otolaryngology

## 2023-06-13 ENCOUNTER — Ambulatory Visit (HOSPITAL_BASED_OUTPATIENT_CLINIC_OR_DEPARTMENT_OTHER): Admit: 2023-06-13 | Payer: Medicare HMO | Admitting: Otolaryngology

## 2023-06-13 ENCOUNTER — Encounter (HOSPITAL_BASED_OUTPATIENT_CLINIC_OR_DEPARTMENT_OTHER): Payer: Self-pay

## 2023-06-13 SURGERY — NASAL SEPTOPLASTY WITH TURBINATE REDUCTION
Anesthesia: General | Laterality: Right
